# Patient Record
Sex: Female | Born: 1935 | Race: White | Hispanic: No | Smoking: Never smoker
Health system: Southern US, Community
[De-identification: ages and names within clinical notes are randomized; demographics above are authoritative.]

## PROBLEM LIST (undated history)

## (undated) DIAGNOSIS — Z90710 Acquired absence of both cervix and uterus: Secondary | ICD-10-CM

## (undated) DIAGNOSIS — Z9889 Other specified postprocedural states: Secondary | ICD-10-CM

## (undated) DIAGNOSIS — Z9049 Acquired absence of other specified parts of digestive tract: Secondary | ICD-10-CM

## (undated) DIAGNOSIS — S335XXA Sprain of ligaments of lumbar spine, initial encounter: Secondary | ICD-10-CM

## (undated) DIAGNOSIS — C4359 Malignant melanoma of other part of trunk: Secondary | ICD-10-CM

## (undated) DIAGNOSIS — I4891 Unspecified atrial fibrillation: Secondary | ICD-10-CM

## (undated) DIAGNOSIS — S42309D Unspecified fracture of shaft of humerus, unspecified arm, subsequent encounter for fracture with routine healing: Secondary | ICD-10-CM

## (undated) DIAGNOSIS — L719 Rosacea, unspecified: Secondary | ICD-10-CM

## (undated) DIAGNOSIS — E785 Hyperlipidemia, unspecified: Secondary | ICD-10-CM

## (undated) DIAGNOSIS — I10 Essential (primary) hypertension: Secondary | ICD-10-CM

## (undated) DIAGNOSIS — M5430 Sciatica, unspecified side: Secondary | ICD-10-CM

## (undated) HISTORY — PX: HEMORRHOID SURGERY: SHX153

## (undated) HISTORY — DX: Sciatica, unspecified side: M54.30

## (undated) HISTORY — DX: Sprain of ligaments of lumbar spine, initial encounter: S33.5XXA

## (undated) HISTORY — DX: Malignant melanoma of other part of trunk: C43.59

## (undated) HISTORY — PX: ABDOMINAL HYSTERECTOMY: SHX81

## (undated) HISTORY — DX: Other specified postprocedural states: Z98.89

## (undated) HISTORY — PX: CHOLECYSTECTOMY: SHX55

## (undated) HISTORY — DX: Acquired absence of other specified parts of digestive tract: Z90.49

## (undated) HISTORY — DX: Acquired absence of both cervix and uterus: Z90.710

## (undated) HISTORY — DX: Unspecified fracture of shaft of humerus, unspecified arm, subsequent encounter for fracture with routine healing: S42.309D

## (undated) HISTORY — PX: HERNIA REPAIR: SHX51

## (undated) HISTORY — DX: Unspecified atrial fibrillation: I48.91

## (undated) HISTORY — DX: Hyperlipidemia, unspecified: E78.5

## (undated) HISTORY — DX: Rosacea, unspecified: L71.9

## (undated) HISTORY — PX: OTHER SURGICAL HISTORY: SHX169

## (undated) HISTORY — PX: BLADDER SUSPENSION: SHX72

## (undated) HISTORY — DX: Other specified postprocedural states: Z98.890

## (undated) HISTORY — DX: Essential (primary) hypertension: I10

## (undated) NOTE — *Deleted (*Deleted)
PMR Admission Coordinator Pre-Admission Assessment  Patient: [name] [name] is an 70 y.o., female MRN: [id number] DOB: 01-17-1931 Height: 5\' 4"  (162.6 cm) Weight: 79.6 kg  Insurance Information HMO:     PPO:      PCP:      IPA:      80/20:      OTHER:  PRIMARY: Medicare Part A and B       Policy#: [id number] Subscriber: Patient  Benefits:  Phone #: passport one source online 9/4 Eff. Date: 09/27/1998  Deduct: $1484      Out of Pocket Max: none      Life Max: none CIR: 100%      SNF: 20 full days Outpatient: 80%     Co-Pay: 20% Home Health: 100%      Co-Pay: none DME: 80%     Co-Pay: 20% Providers: pt choice  SECONDARY: [employer]     Policy#: [id number]      The "Data Collection Information Summary" for patients in Inpatient Rehabilitation Facilities with attached "Privacy Act Statement-Health Care Records" was provided and verbally reviewed with: Patient and Family  Emergency Contact Information Contact Information    Name Relation Home Work Mobile   [facility] Son [phone]     Louise, Rawson [phone]     Eliane, [place] [phone]     Ysidro Evert Daughter [phone]  [phone]      Current Medical History  Patient Admitting Diagnosis:LLE Cellulitis History of Present Illness: 37 year old female with a past medical history of atrial fibrillation on warfarin, chronic diastolic CHF, essential hypertension, hyperlipidemia and anxiety who comes in with left leg pain nausea vomiting and diarrhea.  She was found to be febrile.  Found to be tachycardic.  Noted to have erythema and swelling of the left flank.  Admitted for management of sepsis secondary to left lower extremity cellulitis.   CBC did not show leukocytosis but had significant left WBC shift.  Lactate initially 6.7 that has downward trended to 4.4 following fluids. Had AKI on admission,  with creatinine mildly elevated to 1.10. CTA chest negative for PE but does have an incidental finding of a subsolid  groundglass nodule opacity in the right upper lobe measuring 12 mm with follow-up recommended. CT of the abdomen and pelvis show fat stranding involving the left anterior aspect of the upper thigh and tracking along the iliac vessel but no abscess.  There is also prominent left inguinal and external iliac nodes likely reactive from her left lower extremity cellulitis.  No lower extremity DVT or acute vascular findings.    Patient's medical record from [facility] has been reviewed by the rehabilitation admission coordinator and physician.  Past Medical History  Past Medical History:  Diagnosis Date  . A-fib (HCC)    paroxysmal  . Aftercare for healing traumatic fracture of arm, unspecified   . H/O: hemorrhoidectomy   . H/O: hysterectomy   . Hyperlipidemia   . Hypertension   . Malignant melanoma of skin of trunk, except scrotum (HCC)   . Other postprocedural status(V45.89)   . Rosacea   . S/P cholecystectomy   . Sciatica    right  . Sprain of lumbar region     Family History   family history includes Heart failure in her father and mother; Liver cancer in her daughter.  Prior Rehab/Hospitalizations Has the patient had prior rehab or hospitalizations prior to admission? Yes  Has the patient had major surgery during 100 days prior to admission? No  Current Medications  Current Facility-Administered Medications:  .  ALPRAZolam Prudy Feeler) tablet 0.25 mg, 0.25 mg, Oral, Daily PRN, Tu, Ching T, DO, 0.25 mg at 07/30/20 1855 .  amoxicillin (AMOXIL) capsule 500 mg, 500 mg, Oral, Q8H, Comer, Belia Heman, MD, 500 mg at 07/31/20 1354 .  bisacodyl (DULCOLAX) suppository 10 mg, 10 mg, Rectal, Daily PRN, Dow Adolph N, DO, 10 mg at 07/31/20 0002 .  calcium-vitamin D (OSCAL WITH D) 500-200 MG-UNIT per tablet 1 tablet, 1 tablet, Oral, Q breakfast, Hall, Carole N, DO .  dextromethorphan-guaiFENesin (MUCINEX DM) 30-600 MG per 12 hr tablet 2 tablet, 2 tablet, Oral, BID, Hall, Carole N,  DO, 2 tablet at 07/31/20 1145 .  diltiazem (CARDIZEM) tablet 30 mg, 30 mg, Oral, Q6H, Graciella Freer, PA-C, 30 mg at 07/31/20 1145 .  feeding supplement (ENSURE ENLIVE) (ENSURE ENLIVE) liquid 237 mL, 237 mL, Oral, BID BM, Hall, Carole N, DO .  furosemide (LASIX) injection 20 mg, 20 mg, Intravenous, BID, Hall, Carole N, DO, 20 mg at 07/31/20 1146 .  iohexol (OMNIPAQUE) 350 MG/ML injection 80 mL, 80 mL, Intravenous, Once PRN, Terrilee Files, MD .  ipratropium (ATROVENT) nebulizer solution 0.5 mg, 0.5 mg, Nebulization, BID, Hall, Carole N, DO .  levalbuterol [facility]) nebulizer solution 0.63 mg, 0.63 mg, Nebulization, BID, Hall, Carole N, DO .  magic mouthwash w/lidocaine, 2 mL, Oral, TID PRN, Dow Adolph N, DO, 2 mL at 07/30/20 1017 .  metoprolol tartrate (LOPRESSOR) injection 2.5 mg, 2.5 mg, Intravenous, Q3H PRN, Dow Adolph N, DO, 2.5 mg at 07/29/20 0801 .  metoprolol tartrate (LOPRESSOR) tablet 50 mg, 50 mg, Oral, BID, Hall, Carole N, DO, 50 mg at 07/31/20 1145 .  mineral oil enema 1 enema, 1 enema, Rectal, Once, Hall, Carole N, DO .  ondansetron [facility]) injection 4 mg, 4 mg, Intravenous, Q6H PRN, Osvaldo Shipper, MD, 4 mg at 07/26/20 0814 .  polyethylene glycol (MIRALAX / GLYCOLAX) packet 17 g, 17 g, Oral, Daily, Hall, Carole N, DO, 17 g at 07/31/20 1145 .  polyvinyl alcohol (LIQUIFILM TEARS) 1.4 % ophthalmic solution 1 drop, 1 drop, Both Eyes, BID PRN, Leander Rams, [facility] .  senna-docusate (Senokot-S) tablet 2 tablet, 2 tablet, Oral, BID, Dow Adolph N, DO, 2 tablet at 07/31/20 1145 .  sodium chloride HYPERTONIC 3 % nebulizer solution 4 mL, 4 mL, Nebulization, BID, Hall, Carole N, DO, 4 mL at 07/31/20 0855 .  warfarin (COUMADIN) tablet 1 mg, 1 mg, Oral, ONCE-1600, Hall, Carole N, DO .  Warfarin - Pharmacist Dosing Inpatient, , Does not apply, q1600, Joaquim Lai, [facility], Given at 07/29/20 1716  Patients Current Diet:  Diet Order            Diet regular Room service appropriate?  Yes; Fluid consistency: Thin  Diet effective now                 Precautions / Restrictions Precautions Precautions: Fall Precaution Comments: L LE pain Restrictions Weight Bearing Restrictions: No   Has the patient had 2 or more falls or a fall with injury in the past year? No  Prior Activity Level  Pt. Was active in the community, going out 1-2x a week.  Prior Functional Level Self Care: Did the patient need help bathing, dressing, using the toilet or eating? Independent  Indoor Mobility: Did the patient need assistance with walking from room to room (with or without device)? Independent  Stairs: Did the patient need assistance with internal or external stairs (with or  without device)? Independent  Functional Cognition: Did the patient need help planning regular tasks such as shopping or remembering to take medications? Independent  Home Assistive Devices / Equipment Home Assistive Devices/Equipment: Cane (specify quad or straight) Home Equipment: Tub bench, Cane - single point, Walker - 2 wheels  Prior Device Use: Indicate devices/aids used by the patient prior to current illness, exacerbation or injury? None of the above  Current Functional Level Cognition  Overall Cognitive Status: Within Functional Limits for tasks assessed Orientation Level: Oriented X4    Extremity Assessment (includes Sensation/Coordination)  Upper Extremity Assessment: Generalized weakness  Lower Extremity Assessment: Generalized weakness    ADLs  Overall ADL's : Needs assistance/impaired Eating/Feeding: Set up, Sitting Grooming: Set up, Sitting Upper Body Bathing: Sitting, Minimal assistance Lower Body Bathing: Total assistance, Bed level Upper Body Dressing : Minimal assistance, Sitting Lower Body Dressing: Total assistance, Bed level Lower Body Dressing Details (indicate cue type and reason): Total A to don socks, difficulty donning L sock due to pain Toileting- Clothing Manipulation  and Hygiene: Total assistance, Bed level General ADL Comments: Pt with deficits in strength and endurance though greatly limited by pain in L LE (with touch and movement) requiring extensive assist for ADLs/transfers    Mobility  Overal bed mobility: Needs Assistance Bed Mobility: Supine to Sit Rolling: Mod assist, +2 for physical assistance, +2 for safety/equipment Supine to sit: Max assist, +2 for physical assistance, +2 for safety/equipment Sit to supine: Max assist, +2 for physical assistance, +2 for safety/equipment General bed mobility comments: maxAx2 for coming to EoB, cued for scooting to EOB however needs pad scoot to get L foot on floor    Transfers  Overall transfer level: Needs assistance Transfers: Squat Pivot Transfers Squat pivot transfers: +2 physical assistance, Total assist General transfer comment: total Ax2 for lateral scooting and squat pivot to recliner,     Ambulation / Gait / Stairs / Wheelchair Mobility  Ambulation/Gait General Gait Details: unable to take a step    Posture / Balance Dynamic Sitting Balance Sitting balance - Comments: initially max A able to progress to min guard  Balance Overall balance assessment: Needs assistance Sitting-balance support: Feet supported, Bilateral upper extremity supported Sitting balance-Leahy Scale: Poor Sitting balance - Comments: initially max A able to progress to min guard  Standing balance comment: unable    Special needs/care consideration Skin LLE cellulitis, red, swollen with dressing in place and Designated visitor Daughter   Previous Home Environment (from acute therapy documentation) Living Arrangements: Alone Available Help at Discharge: Family, Available PRN/intermittently Type of Home: House Home Layout: One level Home Access: Stairs to enter, Ramped entrance Entrance Stairs-Rails: Right, Left Entrance Stairs-Number of Steps: 6 Bathroom Shower/Tub: Engineer, manufacturing systems: Standard Home  Care Services: No Additional Comments: Reports tub bench was her husbands and has difficulty using - would like regular shower chair  Discharge Living Setting Plans for Discharge Living Setting: House Type of Home at Discharge: House Discharge Home Layout: Two level Alternate Level Stairs-Rails: None Alternate Level Stairs-Number of Steps: 4 (4) Discharge Bathroom Shower/Tub: Tub/shower unit Discharge Bathroom Toilet: Standard Discharge Bathroom Accessibility: Yes How Accessible: Accessible via wheelchair, Accessible via walker Does the patient have any problems obtaining your medications?: No  Social/Family/Support Systems Patient Roles: Other (Comment) Contact Information: [phone] Anticipated Caregiver: Georgiann Cocker Anticipated Caregiver's Contact Information: [phone] Ability/Limitations of Caregiver:  (None) Caregiver Availability: 24/7 Discharge Plan Discussed with Primary Caregiver: Yes Is Caregiver In Agreement with Plan?: Yes  Goals Patient/Family Goal  for Rehab: PT/OT Mod A Expected length of stay: 14-18 days Pt/Family Agrees to Admission and willing to participate: Yes Program Orientation Provided & Reviewed with Pt/Caregiver Including Roles  & Responsibilities: Yes  Decrease burden of Care through IP rehab admission: Specialzed equipment needs, Decrease number of caregivers and Patient/family education  Possible need for SNF placement upon discharge: Not anticipated  Patient Condition: I have reviewed medical records from [facility], spoken with CM, and patient and family member. I met with patient at the bedside for inpatient rehabilitation assessment.  Patient will benefit from ongoing PT and OT, can actively participate in 3 hours of therapy a day 5 days of the week, and can make measurable gains during the admission.  Patient will also benefit from the coordinated team approach during an Inpatient Acute Rehabilitation admission.  The  patient will receive intensive therapy as well as Rehabilitation physician, nursing, social worker, and care management interventions.  Due to safety, skin/wound care, disease management, medication administration, pain management and patient education the patient requires 24 hour a day rehabilitation nursing.  The patient is currently max A-Max +2  with mobility and basic ADLs.  Discharge setting and therapy post discharge at home with home health is anticipated.  Patient has agreed to participate in the Acute Inpatient Rehabilitation Program and will admit {Time; today/tomorrow:10263}.  Preadmission Screen Completed By:  Jeronimo Greaves, 07/31/2020 3:05 PM ______________________________________________________________________   Discussed status with Dr. Marland Kitchen on *** at *** and received approval for admission today.  Admission Coordinator:  Jeronimo Greaves, CCC-SLP, time ***Dorna Bloom ***   Assessment/Plan: Diagnosis: 1. Does the need for close, 24 hr/day Medical supervision in concert with the patient's rehab needs make it unreasonable for this patient to be served in a less intensive setting? {yes_no_potentially:3041433} 2. Co-Morbidities requiring supervision/potential complications: *** 3. Due to {due ZO:1096045}, does the patient require 24 hr/day rehab nursing? {yes_no_potentially:3041433} 4. Does the patient require coordinated care of a physician, rehab nurse, PT, OT, and SLP to address physical and functional deficits in the context of the above medical diagnosis(es)? {yes_no_potentially:3041433} Addressing deficits in the following areas: {deficits:3041436} 5. Can the patient actively participate in an intensive therapy program of at least 3 hrs of therapy 5 days a week? {yes_no_potentially:3041433} 6. The potential for patient to make measurable gains while on inpatient rehab is {potential:3041437} 7. Anticipated functional outcomes upon discharge from inpatient rehab: {functional  outcomes:304600100} PT, {functional outcomes:304600100} OT, {functional outcomes:304600100} SLP 8. Estimated rehab length of stay to reach the above functional goals is: *** 9. Anticipated discharge destination: {anticipated dc setting:21604} 10. Overall Rehab/Functional Prognosis: {potential:3041437}   MD Signature: ***

---

## 2001-04-23 ENCOUNTER — Encounter (INDEPENDENT_AMBULATORY_CARE_PROVIDER_SITE_OTHER): Payer: Self-pay

## 2001-04-23 ENCOUNTER — Other Ambulatory Visit: Admission: RE | Admit: 2001-04-23 | Discharge: 2001-04-23 | Payer: Self-pay | Admitting: Gastroenterology

## 2003-05-12 ENCOUNTER — Encounter: Payer: Self-pay | Admitting: Internal Medicine

## 2003-05-12 ENCOUNTER — Encounter: Admission: RE | Admit: 2003-05-12 | Discharge: 2003-05-12 | Payer: Self-pay | Admitting: Internal Medicine

## 2004-05-19 ENCOUNTER — Ambulatory Visit (HOSPITAL_COMMUNITY): Admission: RE | Admit: 2004-05-19 | Discharge: 2004-05-19 | Payer: Self-pay | Admitting: Internal Medicine

## 2004-09-01 LAB — HM COLONOSCOPY
HM Colonoscopy: ABNORMAL
HM Colonoscopy: ABNORMAL

## 2004-10-31 ENCOUNTER — Ambulatory Visit: Payer: Self-pay | Admitting: Internal Medicine

## 2004-11-07 ENCOUNTER — Ambulatory Visit: Payer: Self-pay | Admitting: Internal Medicine

## 2004-11-08 ENCOUNTER — Ambulatory Visit (HOSPITAL_COMMUNITY): Admission: RE | Admit: 2004-11-08 | Discharge: 2004-11-08 | Payer: Self-pay | Admitting: *Deleted

## 2004-11-08 ENCOUNTER — Ambulatory Visit: Payer: Self-pay | Admitting: Internal Medicine

## 2004-11-11 ENCOUNTER — Ambulatory Visit: Payer: Self-pay | Admitting: Cardiology

## 2004-11-15 ENCOUNTER — Ambulatory Visit: Payer: Self-pay | Admitting: Cardiology

## 2004-11-15 ENCOUNTER — Ambulatory Visit: Payer: Self-pay

## 2004-11-22 ENCOUNTER — Ambulatory Visit: Payer: Self-pay | Admitting: Cardiology

## 2004-12-01 ENCOUNTER — Ambulatory Visit: Payer: Self-pay | Admitting: Internal Medicine

## 2004-12-02 ENCOUNTER — Ambulatory Visit: Payer: Self-pay | Admitting: Internal Medicine

## 2004-12-29 ENCOUNTER — Ambulatory Visit: Payer: Self-pay | Admitting: Cardiology

## 2005-01-25 ENCOUNTER — Ambulatory Visit: Payer: Self-pay | Admitting: Cardiology

## 2005-01-27 ENCOUNTER — Ambulatory Visit: Payer: Self-pay | Admitting: Internal Medicine

## 2005-01-31 ENCOUNTER — Ambulatory Visit: Payer: Self-pay | Admitting: Internal Medicine

## 2005-02-21 ENCOUNTER — Ambulatory Visit: Payer: Self-pay

## 2005-02-21 ENCOUNTER — Ambulatory Visit: Payer: Self-pay | Admitting: Internal Medicine

## 2005-03-14 ENCOUNTER — Ambulatory Visit: Payer: Self-pay | Admitting: Cardiology

## 2005-03-24 ENCOUNTER — Ambulatory Visit: Payer: Self-pay | Admitting: Internal Medicine

## 2005-04-11 ENCOUNTER — Ambulatory Visit: Payer: Self-pay | Admitting: Cardiology

## 2005-05-09 ENCOUNTER — Ambulatory Visit: Payer: Self-pay | Admitting: Internal Medicine

## 2005-05-11 ENCOUNTER — Ambulatory Visit: Payer: Self-pay | Admitting: Internal Medicine

## 2005-05-17 ENCOUNTER — Ambulatory Visit: Payer: Self-pay | Admitting: Cardiology

## 2005-05-22 ENCOUNTER — Ambulatory Visit: Payer: Self-pay | Admitting: Internal Medicine

## 2005-05-25 ENCOUNTER — Ambulatory Visit: Payer: Self-pay | Admitting: Cardiology

## 2005-05-29 ENCOUNTER — Ambulatory Visit: Payer: Self-pay | Admitting: Internal Medicine

## 2005-06-08 ENCOUNTER — Ambulatory Visit: Payer: Self-pay | Admitting: Cardiology

## 2005-06-08 ENCOUNTER — Ambulatory Visit: Payer: Self-pay | Admitting: Internal Medicine

## 2005-06-30 ENCOUNTER — Ambulatory Visit: Payer: Self-pay | Admitting: Cardiology

## 2005-06-30 ENCOUNTER — Ambulatory Visit: Payer: Self-pay | Admitting: Internal Medicine

## 2005-07-12 ENCOUNTER — Ambulatory Visit: Payer: Self-pay | Admitting: Internal Medicine

## 2005-07-17 ENCOUNTER — Ambulatory Visit: Payer: Self-pay | Admitting: Internal Medicine

## 2005-07-21 ENCOUNTER — Inpatient Hospital Stay (HOSPITAL_COMMUNITY): Admission: RE | Admit: 2005-07-21 | Discharge: 2005-07-22 | Payer: Self-pay | Admitting: Internal Medicine

## 2005-07-23 ENCOUNTER — Ambulatory Visit: Payer: Self-pay | Admitting: Cardiology

## 2005-07-28 ENCOUNTER — Ambulatory Visit: Payer: Self-pay | Admitting: Cardiology

## 2005-08-11 ENCOUNTER — Ambulatory Visit: Payer: Self-pay | Admitting: Cardiology

## 2005-08-17 ENCOUNTER — Ambulatory Visit: Payer: Self-pay | Admitting: Internal Medicine

## 2005-08-25 ENCOUNTER — Ambulatory Visit: Payer: Self-pay | Admitting: Cardiology

## 2005-09-19 ENCOUNTER — Ambulatory Visit: Payer: Self-pay | Admitting: Internal Medicine

## 2005-10-03 ENCOUNTER — Ambulatory Visit: Payer: Self-pay | Admitting: Cardiology

## 2005-10-16 ENCOUNTER — Ambulatory Visit: Payer: Self-pay | Admitting: Cardiology

## 2005-12-11 ENCOUNTER — Ambulatory Visit: Payer: Self-pay | Admitting: Cardiology

## 2005-12-11 ENCOUNTER — Ambulatory Visit: Payer: Self-pay | Admitting: Internal Medicine

## 2005-12-25 ENCOUNTER — Ambulatory Visit: Payer: Self-pay | Admitting: Cardiology

## 2006-01-08 ENCOUNTER — Ambulatory Visit: Payer: Self-pay | Admitting: Internal Medicine

## 2006-02-05 ENCOUNTER — Ambulatory Visit: Payer: Self-pay | Admitting: Cardiology

## 2006-02-12 ENCOUNTER — Ambulatory Visit: Payer: Self-pay | Admitting: Internal Medicine

## 2006-02-26 ENCOUNTER — Ambulatory Visit: Payer: Self-pay | Admitting: Cardiology

## 2006-03-09 ENCOUNTER — Ambulatory Visit: Payer: Self-pay | Admitting: Cardiology

## 2006-03-23 ENCOUNTER — Ambulatory Visit: Payer: Self-pay | Admitting: Internal Medicine

## 2006-03-27 ENCOUNTER — Ambulatory Visit: Payer: Self-pay

## 2006-04-06 ENCOUNTER — Ambulatory Visit: Payer: Self-pay

## 2006-04-06 ENCOUNTER — Ambulatory Visit: Payer: Self-pay | Admitting: Internal Medicine

## 2006-04-18 ENCOUNTER — Ambulatory Visit: Payer: Self-pay | Admitting: Internal Medicine

## 2006-04-24 ENCOUNTER — Ambulatory Visit: Payer: Self-pay | Admitting: Internal Medicine

## 2006-05-10 ENCOUNTER — Ambulatory Visit: Payer: Self-pay | Admitting: Internal Medicine

## 2006-05-22 ENCOUNTER — Ambulatory Visit: Payer: Self-pay | Admitting: Cardiovascular Disease

## 2006-06-06 ENCOUNTER — Ambulatory Visit: Payer: Self-pay | Admitting: Internal Medicine

## 2006-06-20 ENCOUNTER — Ambulatory Visit: Payer: Self-pay | Admitting: Cardiology

## 2006-07-11 ENCOUNTER — Ambulatory Visit: Payer: Self-pay | Admitting: *Deleted

## 2006-07-20 ENCOUNTER — Ambulatory Visit: Payer: Self-pay | Admitting: Cardiovascular Disease

## 2006-07-24 ENCOUNTER — Ambulatory Visit: Payer: Self-pay | Admitting: Internal Medicine

## 2006-08-03 ENCOUNTER — Ambulatory Visit: Payer: Self-pay | Admitting: Cardiology

## 2006-08-15 ENCOUNTER — Ambulatory Visit: Payer: Self-pay | Admitting: Internal Medicine

## 2006-08-31 ENCOUNTER — Ambulatory Visit: Payer: Self-pay | Admitting: Cardiology

## 2006-08-31 ENCOUNTER — Ambulatory Visit: Payer: Self-pay | Admitting: Internal Medicine

## 2006-09-28 ENCOUNTER — Ambulatory Visit: Payer: Self-pay | Admitting: Cardiology

## 2006-10-26 ENCOUNTER — Ambulatory Visit: Payer: Self-pay | Admitting: Cardiology

## 2006-11-23 ENCOUNTER — Ambulatory Visit: Payer: Self-pay | Admitting: Cardiology

## 2006-12-17 ENCOUNTER — Ambulatory Visit: Payer: Self-pay | Admitting: Internal Medicine

## 2006-12-17 LAB — CONVERTED CEMR LAB
HCT: 41.9 % (ref 36.0–46.0)
Hemoglobin: 14.9 g/dL (ref 12.0–15.0)
MCHC: 35.4 g/dL (ref 30.0–36.0)
MCV: 91.1 fL (ref 78.0–100.0)
Platelets: 262 10*3/uL (ref 150–400)
RBC: 4.6 M/uL (ref 3.87–5.11)
RDW: 12.5 % (ref 11.5–14.6)
Rhuematoid fact SerPl-aCnc: <20 intl units/mL — ABNORMAL LOW (ref 0.0–20.0)
Sed Rate: 17 mm/hr (ref 0–25)
Total CK: 98 units/L (ref 7–177)
WBC: 6.9 10*3/uL (ref 4.5–10.5)

## 2006-12-21 ENCOUNTER — Ambulatory Visit: Payer: Self-pay | Admitting: Internal Medicine

## 2007-01-18 ENCOUNTER — Ambulatory Visit: Payer: Self-pay | Admitting: Internal Medicine

## 2007-01-30 ENCOUNTER — Ambulatory Visit: Payer: Self-pay | Admitting: *Deleted

## 2007-02-11 ENCOUNTER — Ambulatory Visit: Payer: Self-pay | Admitting: Internal Medicine

## 2007-02-13 ENCOUNTER — Ambulatory Visit: Payer: Self-pay | Admitting: Cardiovascular Disease

## 2007-02-13 ENCOUNTER — Ambulatory Visit: Payer: Self-pay | Admitting: Internal Medicine

## 2007-02-13 LAB — CONVERTED CEMR LAB
ALT: 31 units/L (ref 0–40)
AST: 31 units/L (ref 0–37)
Albumin: 3.8 g/dL (ref 3.5–5.2)
Alkaline Phosphatase: 87 units/L (ref 39–117)
Bilirubin, Direct: 0.3 mg/dL (ref 0.0–0.3)
Cholesterol: 170 mg/dL (ref 0–200)
HDL: 73.6 mg/dL (ref >39.0)
LDL Cholesterol: 85 mg/dL (ref 0–99)
Total Bilirubin: 1.8 mg/dL — ABNORMAL HIGH (ref 0.3–1.2)
Total CHOL/HDL Ratio: 2.3
Total Protein: 7.4 g/dL (ref 6.0–8.3)
Triglycerides: 59 mg/dL (ref 0–149)
VLDL: 12 mg/dL (ref 0–40)

## 2007-03-13 ENCOUNTER — Ambulatory Visit: Payer: Self-pay | Admitting: Cardiology

## 2007-04-10 ENCOUNTER — Ambulatory Visit: Payer: Self-pay | Admitting: *Deleted

## 2007-05-08 ENCOUNTER — Ambulatory Visit: Payer: Self-pay | Admitting: Cardiology

## 2007-06-05 ENCOUNTER — Ambulatory Visit: Payer: Self-pay

## 2007-06-19 ENCOUNTER — Ambulatory Visit: Payer: Self-pay | Admitting: Cardiology

## 2007-07-17 ENCOUNTER — Ambulatory Visit: Payer: Self-pay | Admitting: Cardiology

## 2007-07-30 ENCOUNTER — Ambulatory Visit: Payer: Self-pay | Admitting: Internal Medicine

## 2007-08-14 ENCOUNTER — Ambulatory Visit: Payer: Self-pay | Admitting: Internal Medicine

## 2007-09-06 ENCOUNTER — Encounter: Payer: Self-pay | Admitting: *Deleted

## 2007-09-06 DIAGNOSIS — Z9889 Other specified postprocedural states: Secondary | ICD-10-CM | POA: Insufficient documentation

## 2007-09-06 DIAGNOSIS — I4891 Unspecified atrial fibrillation: Secondary | ICD-10-CM | POA: Insufficient documentation

## 2007-09-06 DIAGNOSIS — Z9079 Acquired absence of other genital organ(s): Secondary | ICD-10-CM | POA: Insufficient documentation

## 2007-09-06 DIAGNOSIS — I10 Essential (primary) hypertension: Secondary | ICD-10-CM | POA: Insufficient documentation

## 2007-09-06 DIAGNOSIS — E785 Hyperlipidemia, unspecified: Secondary | ICD-10-CM | POA: Insufficient documentation

## 2007-09-06 DIAGNOSIS — Z9089 Acquired absence of other organs: Secondary | ICD-10-CM | POA: Insufficient documentation

## 2007-09-11 ENCOUNTER — Ambulatory Visit: Payer: Self-pay | Admitting: Cardiovascular Disease

## 2007-09-23 ENCOUNTER — Telehealth (INDEPENDENT_AMBULATORY_CARE_PROVIDER_SITE_OTHER): Payer: Self-pay | Admitting: *Deleted

## 2007-10-04 ENCOUNTER — Telehealth: Payer: Self-pay | Admitting: Internal Medicine

## 2007-10-04 ENCOUNTER — Ambulatory Visit: Payer: Self-pay | Admitting: Cardiology

## 2007-10-30 ENCOUNTER — Ambulatory Visit: Payer: Self-pay | Admitting: Internal Medicine

## 2007-10-31 ENCOUNTER — Telehealth: Payer: Self-pay | Admitting: Internal Medicine

## 2007-11-13 ENCOUNTER — Encounter: Payer: Self-pay | Admitting: Internal Medicine

## 2007-11-15 ENCOUNTER — Encounter: Payer: Self-pay | Admitting: Internal Medicine

## 2007-11-26 ENCOUNTER — Ambulatory Visit: Payer: Self-pay | Admitting: Internal Medicine

## 2007-12-10 ENCOUNTER — Ambulatory Visit: Payer: Self-pay | Admitting: Cardiovascular Disease

## 2008-01-07 ENCOUNTER — Ambulatory Visit: Payer: Self-pay | Admitting: Cardiovascular Disease

## 2008-01-14 ENCOUNTER — Encounter: Payer: Self-pay | Admitting: Internal Medicine

## 2008-01-21 ENCOUNTER — Encounter: Payer: Self-pay | Admitting: Internal Medicine

## 2008-02-04 ENCOUNTER — Ambulatory Visit: Payer: Self-pay | Admitting: Cardiology

## 2008-02-05 ENCOUNTER — Ambulatory Visit: Payer: Self-pay | Admitting: Internal Medicine

## 2008-02-21 ENCOUNTER — Ambulatory Visit: Payer: Self-pay | Admitting: Cardiology

## 2008-03-17 ENCOUNTER — Ambulatory Visit: Payer: Self-pay | Admitting: Internal Medicine

## 2008-03-17 LAB — CONVERTED CEMR LAB
ALT: 33 units/L (ref 0–35)
AST: 30 units/L (ref 0–37)
Albumin: 3.7 g/dL (ref 3.5–5.2)
Alkaline Phosphatase: 81 units/L (ref 39–117)
BUN: 16 mg/dL (ref 6–23)
Bilirubin, Direct: 0.2 mg/dL (ref 0.0–0.3)
CO2: 29 meq/L (ref 19–32)
Calcium: 9.6 mg/dL (ref 8.4–10.5)
Chloride: 108 meq/L (ref 96–112)
Cholesterol: 198 mg/dL (ref 0–200)
Creatinine, Ser: 0.9 mg/dL (ref 0.4–1.2)
GFR calc Af Amer: 79 mL/min
GFR calc non Af Amer: 65 mL/min
Glucose, Bld: 112 mg/dL — ABNORMAL HIGH (ref 70–99)
HDL: 87.2 mg/dL (ref >39.0)
LDL Cholesterol: 93 mg/dL (ref 0–99)
Potassium: 4.2 meq/L (ref 3.5–5.1)
Sodium: 145 meq/L (ref 135–145)
TSH: 3.72 microintl units/mL (ref 0.35–5.50)
Total Bilirubin: 1.6 mg/dL — ABNORMAL HIGH (ref 0.3–1.2)
Total CHOL/HDL Ratio: 2.3
Total Protein: 7.2 g/dL (ref 6.0–8.3)
Triglycerides: 89 mg/dL (ref 0–149)
VLDL: 18 mg/dL (ref 0–40)

## 2008-03-18 ENCOUNTER — Ambulatory Visit: Payer: Self-pay | Admitting: Cardiology

## 2008-03-19 ENCOUNTER — Encounter: Payer: Self-pay | Admitting: Internal Medicine

## 2008-04-08 ENCOUNTER — Ambulatory Visit: Payer: Self-pay | Admitting: Cardiology

## 2008-04-28 ENCOUNTER — Ambulatory Visit: Payer: Self-pay | Admitting: Cardiology

## 2008-05-26 ENCOUNTER — Ambulatory Visit: Payer: Self-pay | Admitting: Cardiology

## 2008-06-03 ENCOUNTER — Ambulatory Visit: Payer: Self-pay | Admitting: Internal Medicine

## 2008-06-03 DIAGNOSIS — M5430 Sciatica, unspecified side: Secondary | ICD-10-CM | POA: Insufficient documentation

## 2008-06-06 ENCOUNTER — Encounter: Payer: Self-pay | Admitting: Internal Medicine

## 2008-06-23 ENCOUNTER — Ambulatory Visit: Payer: Self-pay | Admitting: Internal Medicine

## 2008-07-15 ENCOUNTER — Encounter: Payer: Self-pay | Admitting: Internal Medicine

## 2008-07-21 ENCOUNTER — Ambulatory Visit: Payer: Self-pay | Admitting: Cardiology

## 2008-08-20 ENCOUNTER — Ambulatory Visit: Payer: Self-pay | Admitting: Cardiology

## 2008-08-25 ENCOUNTER — Ambulatory Visit: Payer: Self-pay | Admitting: Internal Medicine

## 2008-08-26 ENCOUNTER — Ambulatory Visit: Payer: Self-pay | Admitting: Internal Medicine

## 2008-08-27 ENCOUNTER — Ambulatory Visit: Payer: Self-pay | Admitting: Internal Medicine

## 2008-08-27 LAB — CONVERTED CEMR LAB
ALT: 21 units/L (ref 0–35)
AST: 25 units/L (ref 0–37)
Albumin: 3.6 g/dL (ref 3.5–5.2)
Alkaline Phosphatase: 91 units/L (ref 39–117)
BUN: 15 mg/dL (ref 6–23)
Basophils Absolute: 0 10*3/uL (ref 0.0–0.1)
Basophils Relative: 0.7 % (ref 0.0–3.0)
Bilirubin, Direct: 0.4 mg/dL — ABNORMAL HIGH (ref 0.0–0.3)
CO2: 30 meq/L (ref 19–32)
Calcium: 9.1 mg/dL (ref 8.4–10.5)
Chloride: 105 meq/L (ref 96–112)
Cholesterol: 170 mg/dL (ref 0–200)
Creatinine, Ser: 0.7 mg/dL (ref 0.4–1.2)
Eosinophils Absolute: 0.2 10*3/uL (ref 0.0–0.7)
Eosinophils Relative: 2.7 % (ref 0.0–5.0)
GFR calc Af Amer: 105 mL/min
GFR calc non Af Amer: 87 mL/min
Glucose, Bld: 129 mg/dL — ABNORMAL HIGH (ref 70–99)
HCT: 40.8 % (ref 36.0–46.0)
HDL: 68.9 mg/dL (ref >39.0)
Hemoglobin: 13.8 g/dL (ref 12.0–15.0)
Hgb A1c MFr Bld: 6.3 % — ABNORMAL HIGH (ref 4.6–6.0)
LDL Cholesterol: 88 mg/dL (ref 0–99)
Lymphocytes Relative: 34.7 % (ref 12.0–46.0)
MCHC: 33.7 g/dL (ref 30.0–36.0)
MCV: 92.1 fL (ref 78.0–100.0)
Monocytes Absolute: 0.4 10*3/uL (ref 0.1–1.0)
Monocytes Relative: 6.8 % (ref 3.0–12.0)
Neutro Abs: 3.1 10*3/uL (ref 1.4–7.7)
Neutrophils Relative %: 55.1 % (ref 43.0–77.0)
Platelets: 296 10*3/uL (ref 150–400)
Potassium: 3.7 meq/L (ref 3.5–5.1)
RBC: 4.43 M/uL (ref 3.87–5.11)
RDW: 12.9 % (ref 11.5–14.6)
Sodium: 145 meq/L (ref 135–145)
TSH: 3.61 microintl units/mL (ref 0.35–5.50)
Total Bilirubin: 1.7 mg/dL — ABNORMAL HIGH (ref 0.3–1.2)
Total CHOL/HDL Ratio: 2.5
Total Protein: 7 g/dL (ref 6.0–8.3)
Triglycerides: 64 mg/dL (ref 0–149)
VLDL: 13 mg/dL (ref 0–40)
WBC: 5.6 10*3/uL (ref 4.5–10.5)

## 2008-08-31 ENCOUNTER — Encounter: Payer: Self-pay | Admitting: Internal Medicine

## 2008-09-10 ENCOUNTER — Encounter: Payer: Self-pay | Admitting: Internal Medicine

## 2008-09-11 ENCOUNTER — Telehealth: Payer: Self-pay | Admitting: Internal Medicine

## 2008-09-15 ENCOUNTER — Telehealth: Payer: Self-pay | Admitting: Internal Medicine

## 2008-09-15 ENCOUNTER — Ambulatory Visit: Payer: Self-pay | Admitting: Internal Medicine

## 2008-09-15 DIAGNOSIS — S335XXA Sprain of ligaments of lumbar spine, initial encounter: Secondary | ICD-10-CM | POA: Insufficient documentation

## 2008-09-15 DIAGNOSIS — L719 Rosacea, unspecified: Secondary | ICD-10-CM | POA: Insufficient documentation

## 2008-09-17 ENCOUNTER — Ambulatory Visit: Payer: Self-pay | Admitting: Cardiology

## 2008-09-28 ENCOUNTER — Ambulatory Visit: Payer: Self-pay | Admitting: Cardiovascular Disease

## 2008-10-06 ENCOUNTER — Telehealth: Payer: Self-pay | Admitting: Internal Medicine

## 2008-10-07 ENCOUNTER — Encounter: Payer: Self-pay | Admitting: Internal Medicine

## 2008-10-07 DIAGNOSIS — L989 Disorder of the skin and subcutaneous tissue, unspecified: Secondary | ICD-10-CM | POA: Insufficient documentation

## 2008-10-12 ENCOUNTER — Encounter: Payer: Self-pay | Admitting: Internal Medicine

## 2008-10-12 ENCOUNTER — Ambulatory Visit: Payer: Self-pay | Admitting: Cardiology

## 2008-10-29 ENCOUNTER — Ambulatory Visit: Payer: Self-pay | Admitting: Cardiology

## 2008-11-23 ENCOUNTER — Encounter: Payer: Self-pay | Admitting: Internal Medicine

## 2008-11-25 ENCOUNTER — Ambulatory Visit: Payer: Self-pay | Admitting: Internal Medicine

## 2008-12-24 ENCOUNTER — Ambulatory Visit: Payer: Self-pay | Admitting: Internal Medicine

## 2009-01-21 ENCOUNTER — Ambulatory Visit: Payer: Self-pay | Admitting: Internal Medicine

## 2009-01-28 ENCOUNTER — Ambulatory Visit: Payer: Self-pay | Admitting: Cardiovascular Disease

## 2009-02-19 ENCOUNTER — Ambulatory Visit: Payer: Self-pay | Admitting: Cardiology

## 2009-03-05 ENCOUNTER — Ambulatory Visit: Payer: Self-pay | Admitting: Cardiology

## 2009-03-25 ENCOUNTER — Ambulatory Visit: Payer: Self-pay | Admitting: Internal Medicine

## 2009-03-30 ENCOUNTER — Telehealth: Payer: Self-pay | Admitting: Internal Medicine

## 2009-04-07 ENCOUNTER — Ambulatory Visit: Payer: Self-pay | Admitting: Cardiology

## 2009-04-16 ENCOUNTER — Ambulatory Visit: Payer: Self-pay | Admitting: Cardiology

## 2009-04-27 ENCOUNTER — Encounter: Payer: Self-pay | Admitting: *Deleted

## 2009-04-30 ENCOUNTER — Ambulatory Visit: Payer: Self-pay | Admitting: Cardiology

## 2009-04-30 LAB — CONVERTED CEMR LAB
POC INR: 1.8
Protime: 16.6

## 2009-05-05 ENCOUNTER — Telehealth: Payer: Self-pay | Admitting: Internal Medicine

## 2009-05-14 ENCOUNTER — Encounter (INDEPENDENT_AMBULATORY_CARE_PROVIDER_SITE_OTHER): Payer: Self-pay | Admitting: Pharmacist

## 2009-05-14 ENCOUNTER — Ambulatory Visit: Payer: Self-pay | Admitting: Internal Medicine

## 2009-05-14 LAB — CONVERTED CEMR LAB
POC INR: 2
Protime: 17.6

## 2009-05-18 ENCOUNTER — Telehealth: Payer: Self-pay | Admitting: Internal Medicine

## 2009-05-18 ENCOUNTER — Ambulatory Visit: Payer: Self-pay | Admitting: Internal Medicine

## 2009-05-19 DIAGNOSIS — I4892 Unspecified atrial flutter: Secondary | ICD-10-CM | POA: Insufficient documentation

## 2009-05-24 ENCOUNTER — Encounter: Payer: Self-pay | Admitting: Internal Medicine

## 2009-06-01 ENCOUNTER — Ambulatory Visit: Payer: Self-pay | Admitting: Cardiology

## 2009-06-01 ENCOUNTER — Encounter (INDEPENDENT_AMBULATORY_CARE_PROVIDER_SITE_OTHER): Payer: Self-pay | Admitting: Cardiology

## 2009-06-01 LAB — CONVERTED CEMR LAB
POC INR: 2.5
Prothrombin Time: 19.3 s

## 2009-06-02 ENCOUNTER — Encounter: Payer: Self-pay | Admitting: *Deleted

## 2009-06-08 ENCOUNTER — Encounter: Payer: Self-pay | Admitting: Internal Medicine

## 2009-06-11 ENCOUNTER — Encounter: Admission: RE | Admit: 2009-06-11 | Discharge: 2009-08-26 | Payer: Self-pay | Admitting: Internal Medicine

## 2009-06-11 ENCOUNTER — Encounter: Payer: Self-pay | Admitting: Internal Medicine

## 2009-06-29 ENCOUNTER — Ambulatory Visit: Payer: Self-pay | Admitting: Cardiology

## 2009-06-29 LAB — CONVERTED CEMR LAB
POC INR: 2.4
Prothrombin Time: 18.8 s

## 2009-07-28 ENCOUNTER — Ambulatory Visit: Payer: Self-pay | Admitting: Internal Medicine

## 2009-07-28 ENCOUNTER — Encounter (INDEPENDENT_AMBULATORY_CARE_PROVIDER_SITE_OTHER): Payer: Self-pay | Admitting: Cardiology

## 2009-07-28 LAB — CONVERTED CEMR LAB: POC INR: 2.2

## 2009-08-21 ENCOUNTER — Encounter: Payer: Self-pay | Admitting: Internal Medicine

## 2009-08-25 ENCOUNTER — Encounter: Payer: Self-pay | Admitting: Internal Medicine

## 2009-08-25 ENCOUNTER — Ambulatory Visit: Payer: Self-pay | Admitting: Cardiovascular Disease

## 2009-08-25 LAB — CONVERTED CEMR LAB: POC INR: 2.8

## 2009-09-22 ENCOUNTER — Ambulatory Visit: Payer: Self-pay | Admitting: Cardiology

## 2009-09-22 LAB — CONVERTED CEMR LAB: POC INR: 3

## 2009-10-02 ENCOUNTER — Encounter: Payer: Self-pay | Admitting: Internal Medicine

## 2009-10-19 ENCOUNTER — Ambulatory Visit: Payer: Self-pay | Admitting: Cardiovascular Disease

## 2009-10-19 LAB — CONVERTED CEMR LAB: POC INR: 2.4

## 2009-11-09 ENCOUNTER — Telehealth: Payer: Self-pay | Admitting: Internal Medicine

## 2009-11-17 ENCOUNTER — Ambulatory Visit: Payer: Self-pay | Admitting: Internal Medicine

## 2009-11-17 DIAGNOSIS — I498 Other specified cardiac arrhythmias: Secondary | ICD-10-CM | POA: Insufficient documentation

## 2009-11-17 LAB — CONVERTED CEMR LAB: POC INR: 2.8

## 2009-11-24 ENCOUNTER — Encounter: Payer: Self-pay | Admitting: Internal Medicine

## 2009-12-15 ENCOUNTER — Ambulatory Visit: Payer: Self-pay | Admitting: Cardiovascular Disease

## 2009-12-15 LAB — CONVERTED CEMR LAB: POC INR: 2.3

## 2010-01-12 ENCOUNTER — Ambulatory Visit: Payer: Self-pay | Admitting: Internal Medicine

## 2010-01-12 LAB — CONVERTED CEMR LAB: POC INR: 3

## 2010-02-09 ENCOUNTER — Ambulatory Visit: Payer: Self-pay | Admitting: Internal Medicine

## 2010-02-09 LAB — CONVERTED CEMR LAB: POC INR: 2.5

## 2010-02-15 ENCOUNTER — Telehealth: Payer: Self-pay | Admitting: Internal Medicine

## 2010-03-09 ENCOUNTER — Ambulatory Visit: Payer: Self-pay | Admitting: Internal Medicine

## 2010-03-09 LAB — CONVERTED CEMR LAB: POC INR: 2.3

## 2010-04-05 ENCOUNTER — Encounter (INDEPENDENT_AMBULATORY_CARE_PROVIDER_SITE_OTHER): Payer: Self-pay | Admitting: *Deleted

## 2010-04-05 DIAGNOSIS — L84 Corns and callosities: Secondary | ICD-10-CM | POA: Insufficient documentation

## 2010-04-06 ENCOUNTER — Ambulatory Visit: Payer: Self-pay | Admitting: Internal Medicine

## 2010-04-06 LAB — CONVERTED CEMR LAB: POC INR: 1.7

## 2010-04-11 ENCOUNTER — Encounter (HOSPITAL_BASED_OUTPATIENT_CLINIC_OR_DEPARTMENT_OTHER): Admission: RE | Admit: 2010-04-11 | Discharge: 2010-05-03 | Payer: Self-pay | Admitting: Internal Medicine

## 2010-04-15 ENCOUNTER — Ambulatory Visit (HOSPITAL_COMMUNITY): Admission: RE | Admit: 2010-04-15 | Discharge: 2010-04-15 | Payer: Self-pay | Admitting: Internal Medicine

## 2010-04-18 ENCOUNTER — Encounter: Payer: Self-pay | Admitting: Internal Medicine

## 2010-04-27 ENCOUNTER — Ambulatory Visit: Payer: Self-pay | Admitting: Cardiology

## 2010-04-27 LAB — CONVERTED CEMR LAB: POC INR: 2.1

## 2010-05-18 ENCOUNTER — Ambulatory Visit: Payer: Self-pay | Admitting: Cardiovascular Disease

## 2010-05-18 LAB — CONVERTED CEMR LAB: POC INR: 2.4

## 2010-06-15 ENCOUNTER — Ambulatory Visit: Payer: Self-pay | Admitting: Cardiology

## 2010-06-15 LAB — CONVERTED CEMR LAB: POC INR: 2.1

## 2010-07-18 ENCOUNTER — Ambulatory Visit: Payer: Self-pay | Admitting: Cardiology

## 2010-07-18 LAB — CONVERTED CEMR LAB: POC INR: 2.4

## 2010-08-17 ENCOUNTER — Ambulatory Visit: Payer: Self-pay | Admitting: Cardiology

## 2010-08-17 LAB — CONVERTED CEMR LAB: POC INR: 1.8

## 2010-08-31 ENCOUNTER — Telehealth: Payer: Self-pay | Admitting: Internal Medicine

## 2010-09-07 ENCOUNTER — Ambulatory Visit: Payer: Self-pay | Admitting: Cardiology

## 2010-09-07 LAB — CONVERTED CEMR LAB: POC INR: 2.1

## 2010-09-15 ENCOUNTER — Ambulatory Visit: Payer: Self-pay | Admitting: Internal Medicine

## 2010-10-05 ENCOUNTER — Ambulatory Visit: Payer: Self-pay | Admitting: Cardiovascular Disease

## 2010-10-05 LAB — CONVERTED CEMR LAB: POC INR: 2.4

## 2010-11-02 ENCOUNTER — Ambulatory Visit: Payer: Self-pay | Admitting: Internal Medicine

## 2010-11-02 LAB — CONVERTED CEMR LAB: POC INR: 2.1

## 2010-11-25 ENCOUNTER — Encounter: Payer: Self-pay | Admitting: Internal Medicine

## 2010-11-25 ENCOUNTER — Ambulatory Visit: Payer: Self-pay | Admitting: Internal Medicine

## 2010-11-30 ENCOUNTER — Ambulatory Visit: Admit: 2010-11-30 | Payer: Self-pay

## 2010-12-07 ENCOUNTER — Ambulatory Visit: Admission: RE | Admit: 2010-12-07 | Discharge: 2010-12-07 | Payer: Self-pay | Source: Home / Self Care

## 2010-12-07 LAB — CONVERTED CEMR LAB: POC INR: 1.8

## 2010-12-29 NOTE — Medication Information (Signed)
Summary: rov/tm  Anticoagulant Therapy  Managed by: Weston Brass, PharmD Referring MD: Sharrell Ku PCP: Jacques Navy MD Supervising MD: Riley Kill MD, Maisie Fus Indication 1: Atrial Fibrillation (ICD-427.31) Lab Used: [facility] Site: [facility] INR POC 2.1 INR RANGE 2 - 3  Dietary changes: no    Health status changes: no    Bleeding/hemorrhagic complications: no    Recent/future hospitalizations: no    Any changes in medication regimen? yes       Details: Ciprofloxacin 500mg  twice daily for 10 days per Dr Jarold Motto . calous/blister on foot. Last dose 04/26/10  Recent/future dental: no  Any missed doses?: no       Is patient compliant with meds? yes       Allergies: 1)  ! Percocet  Anticoagulation Management History:      The patient is taking warfarin and comes in today for a routine follow up visit.  Positive risk factors for bleeding include an age of 50 years or older.  The bleeding index is 'intermediate risk'.  Positive CHADS2 values include History of HTN and Age > 37 years old.  The start date was 11/11/2004.  Anticoagulation responsible provider: Riley Kill MD, Maisie Fus.  INR POC: 2.1.  Cuvette Lot#: [id number].  Exp: 06/2011.    Anticoagulation Management Assessment/Plan:      The patient's current anticoagulation dose is Warfarin sodium 2.5 mg tabs: Use as directed by Anticoagualtion Clinic.  The target INR is 2.0-3.0.  The next INR is due 05/18/2010.  Anticoagulation instructions were given to patient.  Results were reviewed/authorized by Weston Brass, PharmD.  She was notified by [facility] [employer].         Prior Anticoagulation Instructions: INR 1.7 Today 7.5mg s( 3 pills)  then resume 2.5mg s daily except 5mg s on Wednesdays. Recheck in 3 weeks.   Current Anticoagulation Instructions: INR-2.1 Resume normal dosing schedule. Take 1 tablet daily except take 2 tablets on wednesday of each week. Recheck in 3 weeks.

## 2010-12-29 NOTE — Letter (Signed)
Summary: pt injured her back  pt injured her back   Imported By: Lester [place] 09/16/2008 12:13:54  _____________________________________________________________________  External Attachment:    Type:   Image     Comment:   External Document

## 2010-12-29 NOTE — Assessment & Plan Note (Signed)
Summary: YEARLY FU/MEDICARE/LEG PROBLEM/BACK PROBLEM/NWS   Vital Signs:  Patient Profile:   77 Years Old Female Height:     62 inches Weight:      199 pounds BMI:     36.53 Temp:     98.8 degrees F oral Pulse rate:   60 / minute BP sitting:   134 / 84  (left arm) Cuff size:   large  Vitals Entered By: Zackery Barefoot CMA (August 25, 2008 2:55 PM)                  PCP:  Jacques Navy MD  Chief Complaint:  Annual visit for disease management.  History of Present Illness: Presents for follow up of multiple chronic problems.  Has pain in the proximal left UE with adduction - relieved with tramadol.  She has been doing well with all of her medications. Coumadin levels have been fine.  There are no specific complaints or new problems which she needs addressed today. Her home situation remains very stable.     Updated Prior Medication List: WARFARIN SODIUM 5 MG  TABS (WARFARIN SODIUM) Take 2.5 mg all days except monday and wednesday 5mg . METOPROLOL TARTRATE 50 MG  TABS (METOPROLOL TARTRATE) Take 1/2 tablet twice daily HYDROCHLOROTHIAZIDE 25 MG  TABS (HYDROCHLOROTHIAZIDE) Take one tablet once daily MULTIVITAMINS   TABS (MULTIPLE VITAMIN) Take one tablet once daily CALCIUM PLUS VITAMIN D 500-50 MG-UNIT  CAPS (CALCIUM CARBONATE-VITAMIN D) Take one tablet twice daily LIPITOR 20 MG  TABS (ATORVASTATIN CALCIUM) Take one tablet at bedtime ANUSOL-HC 25 MG  SUPP (HYDROCORTISONE ACETATE) Take 2 as needed HYDROCODONE-ACETAMINOPHEN 5-500 MG  TABS (HYDROCODONE-ACETAMINOPHEN) Take one tablet every 6-8 hours as needed. ALPRAZOLAM 0.25 MG TABS (ALPRAZOLAM) 1 every 6 hours as needed TRAMADOL HCL 50 MG  TABS (TRAMADOL HCL) 1 by mouth 4 times a day as needed  Current Allergies: No known allergies   Past Medical History:    Reviewed history from 09/06/2007 and no changes required:       HYPERLIPIDEMIA (ICD-272.4)       HYPERTENSION (ICD-401.9)       ATRIAL FIBRILLATION,  PAROXYSMAL (ICD-427.31)          Past Surgical History:    Reviewed history from 09/06/2007 and no changes required:       * CATHETER ABLATION OF ATRIAL FLUTTER       HERNIORRHAPHY, HX OF (ICD-V45.89)       BLADDER SUSPENSION, HX OF (ICD-V45.89)       TOTAL ABDOMINAL HYSTERECTOMY, HX OF (ICD-V45.77)       CHOLECYSTECTOMY, HX OF (ICD-V45.79)       HEMORRHOIDECTOMY, HX OF (ICD-V45.89)   Family History:    father- deceased @ 67: CHF    mother- deceased @74 : CHF    Neg-breast or colon cancer, DM, CAD  Social History:    10th grade    married '51    4 sons - '52, '54, '58, '70; 2 daughters - '56, '64 older girl died ovarian cancer    8 grandchildren, 2 great-grandchildren    retired: Marketing executive.   Risk Factors:  Caffeine use:  1 drinks per day Alcohol use:  no Exercise:  no Seatbelt use:  100 %  Mammogram History:     Date of Last Mammogram:  11/13/2007   Colonoscopy History:     Date of Last Colonoscopy:  09/01/2004    Results:  Diverticulosis, hyperplast polyps    Review of Systems  The patient complains of weight gain.  The patient denies anorexia, fever, weight loss, vision loss, decreased hearing, hoarseness, chest pain, dyspnea on exertion, headaches, abdominal pain, muscle weakness, difficulty walking, depression, and angioedema.     Physical Exam  General:     obese white female NAD Head:     Normocephalic and atraumatic without obvious abnormalities. No apparent alopecia or balding. Eyes:     vision grossly intact, pupils equal, pupils round, corneas and lenses clear, no injection, no iris abnormalities, and no optic disk abnormalities.   Ears:     R ear normal and L ear normal.  Cerumen in both ears. TM's normal. Nose:     no external deformity.   Mouth:     good dentition, no gingival abnormalities, pharynx pink and moist, and no posterior lymphoid hypertrophy.   Neck:     No deformities, masses, or tenderness noted. Chest Wall:     No  deformities, masses, or tenderness noted. Breasts:     No mass, nodules, thickening, tenderness, bulging, retraction, inflamation, nipple discharge or skin changes noted.   Lungs:     normal respiratory effort, no intercostal retractions, no accessory muscle use, normal breath sounds, and no wheezes.   Heart:     normal rate, regular rhythm, no murmur, no rub, and no JVD.   Abdomen:     Obese, soft, BS positive. girth prevents palpation. Msk:     No deformity or scoliosis noted of thoracic or lumbar spine.   Extremities:     No clubbing, cyanosis, edema, or deformity noted with normal full range of motion of all joints.   Neurologic:     alert & oriented X3, cranial nerves II-XII intact, gait normal, and DTRs symmetrical and normal.   Skin:     turgor normal, color normal, no rashes, and no edema.   Cervical Nodes:     No lymphadenopathy noted Axillary Nodes:     No palpable lymphadenopathy Psych:     Oriented X3, memory intact for recent and remote, normally interactive, good eye contact, and not anxious appearing.      Impression & Recommendations:  Problem # 1:  SCIATICA, RIGHT (ICD-724.3) She reports that she has not had recent flares or any limitation in activity. When needed her current regimen of medication is helpful in relieving her discomfort.  Plan: continue present medical regimen.  Her updated medication list for this problem includes:    Hydrocodone-acetaminophen 5-500 Mg Tabs (Hydrocodone-acetaminophen) .Marland Kitchen... Take one tablet every 6-8 hours as needed.    Tramadol Hcl 50 Mg Tabs (Tramadol hcl) .Marland Kitchen... 1 by mouth 4 times a day as needed   Problem # 2:  HYPERLIPIDEMIA (ICD-272.4)  Her updated medication list for this problem includes:    Lipitor 20 Mg Tabs (Atorvastatin calcium) .Marland Kitchen... Take one tablet at bedtime  Labs Reviewed: Chol: 198 (03/17/2008)   HDL: 87.2 (03/17/2008)   LDL: 93 (03/17/2008)   TG: 89 (03/17/2008) SGOT: 30 (03/17/2008)   SGPT: 33  (03/17/2008)  Plan: follow up laboratory with adjustments if needed to maintain goals.   Problem # 3:  HYPERTENSION (ICD-401.9)  Her updated medication list for this problem includes:    Metoprolol Tartrate 50 Mg Tabs (Metoprolol tartrate) .Marland Kitchen... Take 1/2 tablet twice daily    Hydrochlorothiazide 25 Mg Tabs (Hydrochlorothiazide) .Marland Kitchen... Take one tablet once daily  BP today: 134/84 Prior BP: 159/79 (06/03/2008)  Labs Reviewed: Creat: 0.9 (03/17/2008) Chol: 198 (03/17/2008)   HDL: 87.2 (03/17/2008)  LDL: 93 (03/17/2008)   TG: 89 (03/17/2008)  Adequate control on present medications. Plan - continue present medications.   Problem # 4:  ATRIAL FIBRILLATION, PAROXYSMAL (ICD-427.31) Patient in sinus rhythm today. She is s/p ablation. Her anticoagulation is managed in the coag clinic.  Plan: per cardiology.  Her updated medication list for this problem includes:    Warfarin Sodium 5 Mg Tabs (Warfarin sodium) .Marland Kitchen... Take 2.5 mg all days except monday and wednesday 5mg .    Metoprolol Tartrate 50 Mg Tabs (Metoprolol tartrate) .Marland Kitchen... Take 1/2 tablet twice daily   Problem # 5:  Preventive Health Care (ICD-V70.0) Current with breast health - will be due for mammogram in December '09. Current with colorectal cancer screening.  She is aware of the need to manage her weight: smart food choices, portion size control and some walking or other aerobic exercise.  In summary: a very pleasant woman who is medically stable. She will be notified under seperate cover of her lab results and any recommendations. She will return in 1 year or as needed.  Complete Medication List: 1)  Warfarin Sodium 5 Mg Tabs (Warfarin sodium) .... Take 2.5 mg all days except monday and wednesday 5mg . 2)  Metoprolol Tartrate 50 Mg Tabs (Metoprolol tartrate) .... Take 1/2 tablet twice daily 3)  Hydrochlorothiazide 25 Mg Tabs (Hydrochlorothiazide) .... Take one tablet once daily 4)  Multivitamins Tabs (Multiple vitamin) ....  Take one tablet once daily 5)  Calcium Plus Vitamin D 500-50 Mg-unit Caps (Calcium carbonate-vitamin d) .... Take one tablet twice daily 6)  Lipitor 20 Mg Tabs (Atorvastatin calcium) .... Take one tablet at bedtime 7)  Anusol-hc 25 Mg Supp (Hydrocortisone acetate) .... Take 2 as needed 8)  Hydrocodone-acetaminophen 5-500 Mg Tabs (Hydrocodone-acetaminophen) .... Take one tablet every 6-8 hours as needed. 9)  Alprazolam 0.25 Mg Tabs (Alprazolam) .Marland Kitchen.. 1 every 6 hours as needed 10)  Tramadol Hcl 50 Mg Tabs (Tramadol hcl) .Marland Kitchen.. 1 by mouth 4 times a day as needed    ]

## 2010-12-29 NOTE — Medication Information (Signed)
Summary: rov.mp  Anticoagulant Therapy  Managed by: Weston Brass, PharmD Referring MD: Sharrell Ku PCP: Jacques Navy MD Supervising MD: Clifton James MD, Cristal Deer Indication 1: Atrial Fibrillation (ICD-427.31) Lab Used: [facility] Site: [facility] INR POC 2.8 INR RANGE 2 - 3  Dietary changes: no    Health status changes: no    Bleeding/hemorrhagic complications: no    Recent/future hospitalizations: no    Any changes in medication regimen? no    Recent/future dental: no  Any missed doses?: no       Is patient compliant with meds? yes       Allergies (verified): 1)  ! Percocet  Anticoagulation Management History:      The patient is taking warfarin and comes in today for a routine follow up visit.  Positive risk factors for bleeding include an age of 75 years or older.  The bleeding index is 'intermediate risk'.  Positive CHADS2 values include History of HTN and Age > 80 years old.  The start date was 11/11/2004.  Anticoagulation responsible provider: Clifton James MD, Cristal Deer.  INR POC: 2.8.  Cuvette Lot#: [id number].  Exp: 09/2010.    Anticoagulation Management Assessment/Plan:      The patient's current anticoagulation dose is Warfarin sodium 2.5 mg tabs: Use as directed by Anticoagualtion Clinic.  The target INR is 2.0-3.0.  The next INR is due 09/22/2009.  Anticoagulation instructions were given to patient.  Results were reviewed/authorized by Weston Brass, PharmD.  She was notified by Massie Bougie, PharmD Cand..         Prior Anticoagulation Instructions: INR 2.2  Continue 2 tabs each Wednesday and 1 tab on all other days.    Recheck on  08/25/2009.    Current Anticoagulation Instructions: INR 2.8  The patient is to continue with the same dose of coumadin.  This dosage includes:

## 2010-12-29 NOTE — Medication Information (Signed)
Summary: rov/tm  Anticoagulant Therapy  Managed by: Bethena Midget, RN, BSN Referring MD: Sharrell Ku PCP: Jacques Navy MD Supervising MD: Eden Emms MD, Theron Arista Indication 1: Atrial Fibrillation (ICD-427.31) Lab Used: [facility] Site: [facility] INR POC 2.4 INR RANGE 2 - 3  Dietary changes: no    Health status changes: no    Bleeding/hemorrhagic complications: no    Recent/future hospitalizations: no    Any changes in medication regimen? no    Recent/future dental: no  Any missed doses?: no       Is patient compliant with meds? yes       Allergies: 1)  ! Percocet  Anticoagulation Management History:      The patient is taking warfarin and comes in today for a routine follow up visit.  Positive risk factors for bleeding include an age of 75 years or older.  The bleeding index is 'intermediate risk'.  Positive CHADS2 values include History of HTN and Age > 69 years old.  The start date was 11/11/2004.  Anticoagulation responsible provider: Eden Emms MD, Theron Arista.  INR POC: 2.4.  Cuvette Lot#: [id number].  Exp: 09/2011.    Anticoagulation Management Assessment/Plan:      The patient's current anticoagulation dose is Warfarin sodium 2.5 mg tabs: Use as directed by Anticoagualtion Clinic.  The target INR is 2.0-3.0.  The next INR is due 11/02/2010.  Anticoagulation instructions were given to patient.  Results were reviewed/authorized by Bethena Midget, RN, BSN.  She was notified by Bethena Midget, RN, BSN.         Prior Anticoagulation Instructions: INR 2.1 Continue 2.5mg s daily except 5mg s on Wednesdays. Recheck in 4 weeks.   Current Anticoagulation Instructions: INR 2.4 Continue 2.5mg s daily except 5mg s on Wednesdays. Recheck in 4 weeks.

## 2010-12-29 NOTE — Letter (Signed)
   [facility] [address] [place], [place]  [address] Phone: [phone]      March 19, 2008   [facility] [address] [place], [place] [address]  RE:  LAB RESULTS  Dear  Ms. Hellmer,  The following is an interpretation of your most recent lab tests.  Please take note of any instructions provided or changes to medications that have resulted from your lab work.  ELECTROLYTES:  Good - no changes needed  KIDNEY FUNCTION TESTS:  Good - no changes needed  LIVER FUNCTION TESTS:  Good - no changes needed  LIPID PANEL:  Good - no changes needed Triglyceride: 89   Cholesterol: 198   LDL: 93   HDL: 87.2   Chol/HDL%:  2.3 CALC  THYROID STUDIES:  Thyroid studies normal TSH: 3.72     DIABETIC STUDIES:  Good - no changes needed Blood Glucose: 112     Your labs look just fine.  I regret that my schedule is such that you got bumped until July. However, the lab work indicates things are going well. For any acute problem I will see you at any time!  Call or e-mail me if you have questions ([email or web address])    Sincerely Yours,    Jacques Navy MD

## 2010-12-29 NOTE — Miscellaneous (Signed)
Summary: [facility]  [facility]   Imported By: Sherian Rein 08/26/2009 12:24:21  _____________________________________________________________________  External Attachment:    Type:   Image     Comment:   External Document

## 2010-12-29 NOTE — Letter (Signed)
Summary: [facility]  [facility]   Imported By: Lester Sterling 11/12/2008 12:21:35  _____________________________________________________________________  External Attachment:    Type:   Image     Comment:   External Document

## 2010-12-29 NOTE — Medication Information (Signed)
Summary: rov/tm  Anticoagulant Therapy  Managed by: Cloyde Reams, RN, BSN Referring MD: Sharrell Ku PCP: Jacques Navy MD Supervising MD: Johney Frame MD, Fayrene Fearing Indication 1: Atrial Fibrillation (ICD-427.31) Lab Used: [facility] Site: [facility] INR POC 2.5 INR RANGE 2 - 3  Dietary changes: no    Health status changes: no    Bleeding/hemorrhagic complications: no    Recent/future hospitalizations: no    Any changes in medication regimen? no    Recent/future dental: no  Any missed doses?: yes     Details: Missed 1 dose 1-2 weeks ago.    Is patient compliant with meds? yes       Allergies (verified): 1)  ! Percocet  Anticoagulation Management History:      The patient is taking warfarin and comes in today for a routine follow up visit.  Positive risk factors for bleeding include an age of 22 years or older.  The bleeding index is 'intermediate risk'.  Positive CHADS2 values include History of HTN and Age > 75 years old.  The start date was 11/11/2004.  Anticoagulation responsible provider: Hally Colella MD, Fayrene Fearing.  INR POC: 2.5.  Cuvette Lot#: [id number].  Exp: 03/2011.    Anticoagulation Management Assessment/Plan:      The patient's current anticoagulation dose is Warfarin sodium 2.5 mg tabs: Use as directed by Anticoagualtion Clinic.  The target INR is 2.0-3.0.  The next INR is due 03/09/2010.  Anticoagulation instructions were given to patient.  Results were reviewed/authorized by Cloyde Reams, RN, BSN.  She was notified by Cloyde Reams RN.         Prior Anticoagulation Instructions: INR 3.0  Take 1 tablet today then resume same dosage 1 tablet daily except 2 tablets on Wednesdays.  Recheck in 4 weeks.    Current Anticoagulation Instructions: INR 2.5  Continue on same dosage 1 tablet daily except 2 tablets on Wednesdays.  Recheck in 4 weeks.

## 2010-12-29 NOTE — Miscellaneous (Signed)
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

## 2010-12-29 NOTE — Progress Notes (Signed)
Summary: Req for apt  Medications Added ZOCOR 20 MG TABS (SIMVASTATIN)        Phone Note Call from Patient Call back at Home Phone [phone]   Summary of Call: Patient is requesting a CPX with Dr Debby Bud before the end of the year. Pt is having back, leg & hip pain off and on and has other things she would like to talk to you about. She is also conserned about her liver b/c she is on Lipitor. Initial call taken by: Lamar Sprinkles,  September 23, 2007 4:38 PM  Follow-up for Phone Call        ok for appointment at first available.  Follow-up by: Jacques Navy MD,  September 23, 2007 4:47 PM  Additional Follow-up for Phone Call Additional follow up Details #1::        Can you please schedule Additional Follow-up by: Lamar Sprinkles,  September 23, 2007 5:21 PM    Additional Follow-up for Phone Call Additional follow up Details #2::    PT HAS AN APPT. ON  NOV. 12, 2008 FOR A YEARLY FU. PT IS AWARE/NWS Follow-up by: Hilarie Fredrickson,  September 24, 2007 8:41 AM  New/Updated Medications: ZOCOR 20 MG TABS (SIMVASTATIN)

## 2010-12-29 NOTE — Consult Note (Signed)
Summary: Diabetic Eye Exam-Dr. Lita Mains. [place]  Diabetic Eye Exam-Dr. Lita Mains. [place]olan   Imported By: Maryln Gottron 07/28/2008 15:09:52  _____________________________________________________________________  External Attachment:    Type:   Image     Comment:   External Document

## 2010-12-29 NOTE — Medication Information (Signed)
Summary: rov/lhk  Anticoagulant Therapy  Managed by: Cloyde Reams, RN, BSN Referring MD: Sharrell Ku PCP: Jacques Navy MD Supervising MD: Shirlee Latch MD, Junaid Wurzer Indication 1: Atrial Fibrillation (ICD-427.31) Lab Used: [facility] Site: [facility] INR POC 3.0 INR RANGE 2 - 3  Dietary changes: no    Health status changes: no    Bleeding/hemorrhagic complications: no    Recent/future hospitalizations: no    Any changes in medication regimen? no    Recent/future dental: no  Any missed doses?: no       Is patient compliant with meds? yes       Current Medications (verified): 1)  Warfarin Sodium 2.5 Mg Tabs (Warfarin Sodium) .... Use As Directed By Anticoagualtion Clinic 2)  Metoprolol Tartrate 50 Mg  Tabs (Metoprolol Tartrate) .... Take 1/2 Tablet Twice Daily 3)  Hydrochlorothiazide 25 Mg  Tabs (Hydrochlorothiazide) .... Take One Tablet Once Daily 4)  Multivitamins   Tabs (Multiple Vitamin) .... Take One Tablet Once Daily 5)  Calcium Plus Vitamin D 500-50 Mg-Unit  Caps (Calcium Carbonate-Vitamin D) .... Take One Tablet Twice Daily 6)  Lipitor 20 Mg  Tabs (Atorvastatin Calcium) .... Take One Tablet At Bedtime 7)  Anusol-Hc 25 Mg  Supp (Hydrocortisone Acetate) .... Take 2 As Needed 8)  Hydrocodone-Acetaminophen 5-500 Mg  Tabs (Hydrocodone-Acetaminophen) .... Take One Tablet Every 6-8 Hours As Needed. 9)  Alprazolam 0.25 Mg Tabs (Alprazolam) .Marland Kitchen.. 1 Every 6 Hours As Needed 10)  Tramadol Hcl 50 Mg  Tabs (Tramadol Hcl) .Marland Kitchen.. 1 By Mouth 4 Times A Day As Needed 11)  Cyclobenzaprine Hcl 10 Mg Tabs (Cyclobenzaprine Hcl) .... 1/2 or 1 At Bedtime and Q 6 As Needed Back Spasm. 12)  Amiodarone Hcl 200 Mg Tabs (Amiodarone Hcl) .Marland Kitchen.. 1 By Mouth Once Daily As Directed (Current:  1 Tab Daily / 0. 5 Tabs Fri/sat/sun) 13)  Iron 325 (65 Fe) Mg Tabs (Ferrous Sulfate) .Marland Kitchen.. 1 By Mouth Once Daily 14)  Aleve 220 Mg Caps (Naproxen Sodium) .... No More That 3 in 24 Hours 15)  Alprazolam 0.25 Mg Tabs  (Alprazolam) .Marland Kitchen.. 1 By Mouth Q6 As Needed  Allergies (verified): 1)  ! Percocet  Anticoagulation Management History:      The patient is taking warfarin and comes in today for a routine follow up visit.  Positive risk factors for bleeding include an age of 75 years or older.  The bleeding index is 'intermediate risk'.  Positive CHADS2 values include History of HTN and Age > 75 years old.  The start date was 11/11/2004.  Anticoagulation responsible provider: Shirlee Latch MD, Aparna Vanderweele.  INR POC: 3.0.  Cuvette Lot#: [id number].  Exp: 08/2010.    Anticoagulation Management Assessment/Plan:      The patient's current anticoagulation dose is Warfarin sodium 2.5 mg tabs: Use as directed by Anticoagualtion Clinic.  The target INR is 2.0-3.0.  The next INR is due 10/19/2009.  Anticoagulation instructions were given to patient.  Results were reviewed/authorized by Cloyde Reams, RN, BSN.  She was notified by Cloyde Reams RN.         Prior Anticoagulation Instructions: INR 2.8  The patient is to continue with the same dose of coumadin.  This dosage includes:   Current Anticoagulation Instructions: INR 3.0  Take 1 tablet today then resume same dosage 1 tablet daily except 2 tablets on Wednesdays.  Recheck in 4 weeks.

## 2010-12-29 NOTE — Letter (Signed)
Summary: Handout Printed  Printed Handout:  - Coumadin Instructions 

## 2010-12-29 NOTE — Miscellaneous (Signed)
Summary: [facility] for [facility]   Referral for [facility]   Imported By: Sherian Rein 06/11/2009 12:58:48  _____________________________________________________________________  External Attachment:    Type:   Image     Comment:   External Document

## 2010-12-29 NOTE — Miscellaneous (Signed)
Summary: [facility]   Imported By: Lester Kand[place]ohi 09/15/2009 09:07:41  _____________________________________________________________________  External Attachment:    Type:   Image     Comment:   External Document

## 2010-12-29 NOTE — Progress Notes (Signed)
Summary: ortho md  Phone Note Call from Patient   Caller: Patient Reason for Call: Talk to Doctor Summary of Call: Pt states she broke her arm. want to ask md who he recommend for orthopedic md. Initial call taken by: Orlan Leavens,  May 05, 2009 3:11 PM  Follow-up for Phone Call        Called pt back spoke with husband md is out of office will be back tomorrow. will call back with info as soon as he ansew msg Follow-up by: Orlan Leavens,  May 05, 2009 3:13 PM  Additional Follow-up for Phone Call Additional follow up Details #1::        Dr.Mat Charlann Boxer at [facility] ortho Additional Follow-up by: Jacques Navy MD,  May 06, 2009 6:31 AM  New Problems: AFTERCARE HEALING TRAUMATIC FRACTURE ARM UNSPEC (ICD-V54.10)   Additional Follow-up for Phone Call Additional follow up Details #2::    L/M for c/b............................Marland KitchenBeola Cord, CMA  May 06, 2009 4:02 PM    Spoke with patient's husband. Husband state he will have wife c/b office..................Marland KitchenBeola Cord, CMA  May 06, 2009 4:04 PM   Pt is in [place], [place] 4/12 and fx humerous & wrist. Has been in physical therapy. She is req referral for out patient therapy when she returns home. She should be home the week of the 21st.  Follow-up by: Lamar Sprinkles,  May 06, 2009 5:24 PM  Additional Follow-up for Phone Call Additional follow up Details #3:: Details for Additional Follow-up Action Taken: [facility]. [facility] notified Additional Follow-up by: Jacques Navy MD,  May 07, 2009 10:13 AM  New Problems: AFTERCARE HEALING TRAUMATIC FRACTURE ARM UNSPEC (ICD-V54.10)

## 2010-12-29 NOTE — Medication Information (Signed)
Summary: rov/ewj  Anticoagulant Therapy  Managed by: Bethena Midget, RN, BSN Referring MD: Sharrell Ku PCP: Jacques Navy MD Supervising MD: Johney Frame MD, Fayrene Fearing Indication 1: Atrial Fibrillation (ICD-427.31) Lab Used: [facility] Site: [facility] INR POC 2.3 INR RANGE 2 - 3  Dietary changes: no    Health status changes: no    Bleeding/hemorrhagic complications: no    Recent/future hospitalizations: no    Any changes in medication regimen? no    Recent/future dental: no  Any missed doses?: yes     Details: Thinks she missd yesterday's dose.  Is patient compliant with meds? yes       Allergies: 1)  ! Percocet  Anticoagulation Management History:      The patient is taking warfarin and comes in today for a routine follow up visit.  Positive risk factors for bleeding include an age of 74 years or older.  The bleeding index is 'intermediate risk'.  Positive CHADS2 values include History of HTN and Age > 66 years old.  The start date was 11/11/2004.  Anticoagulation responsible provider: Chelsie Burel MD, Fayrene Fearing.  INR POC: 2.3.  Cuvette Lot#: [id number].  Exp: 03/2011.    Anticoagulation Management Assessment/Plan:      The patient's current anticoagulation dose is Warfarin sodium 2.5 mg tabs: Use as directed by Anticoagualtion Clinic.  The target INR is 2.0-3.0.  The next INR is due 04/06/2010.  Anticoagulation instructions were given to patient.  Results were reviewed/authorized by Bethena Midget, RN, BSN.  She was notified by Bethena Midget, RN, BSN.         Prior Anticoagulation Instructions: INR 2.5  Continue on same dosage 1 tablet daily except 2 tablets on Wednesdays.  Recheck in 4 weeks.    Current Anticoagulation Instructions: INR 2.3 Tomorrow take 5mg s. Continue 2.5mg s daily except 5mg s on Wednesdays. Recheck in 4 weeks.

## 2010-12-29 NOTE — Letter (Signed)
Summary: RE: [facility]  RE: [facility]   Imported By: Lester Montezuma 10/01/2008 13:21:04  _____________________________________________________________________  External Attachment:    Type:   Image     Comment:   External Document

## 2010-12-29 NOTE — Medication Information (Signed)
Summary: rov/sp  Anticoagulant Therapy  Managed by: Weston Brass, PharmD Referring MD: Sharrell Ku PCP: Jacques Navy MD Supervising MD: Johney Frame MD, Fayrene Fearing Indication 1: Atrial Fibrillation (ICD-427.31) Lab Used: [facility] Site: [facility] INR POC 1.8 INR RANGE 2 - 3  Dietary changes: yes       Details: Extra Vitamin K   Health status changes: no    Bleeding/hemorrhagic complications: no    Recent/future hospitalizations: no    Any changes in medication regimen? no    Recent/future dental: no  Any missed doses?: no       Is patient compliant with meds? yes       Allergies: 1)  ! Percocet  Anticoagulation Management History:      The patient is taking warfarin and comes in today for a routine follow up visit.  Positive risk factors for bleeding include an age of 75 years or older.  The bleeding index is 'intermediate risk'.  Positive CHADS2 values include History of HTN and Age > 75 years old.  The start date was 11/11/2004.  Anticoagulation responsible provider: Laurie Lovejoy MD, Fayrene Fearing.  INR POC: 1.8.  Cuvette Lot#: [id number].  Exp: 12/2011.    Anticoagulation Management Assessment/Plan:      The patient's current anticoagulation dose is Warfarin sodium 2.5 mg tabs: Use as directed by Anticoagualtion Clinic.  The target INR is 2.0-3.0.  The next INR is due 01/04/2011.  Anticoagulation instructions were given to patient.  Results were reviewed/authorized by Weston Brass, PharmD.  She was notified by Stephannie Peters, PharmD Candidate .         Prior Anticoagulation Instructions: INR 2.1  Continue same dose of 1 tablet every day except 2 tablets on Wednesday.  Recheck INR in 4 weeks.   Current Anticoagulation Instructions: INR 1.8  Coumadin 2.5 mg tablets -  Take 2 tablets today and 1.5 tablets tomorrow, then resume 1 tablet every day except 2 tablets on Wednesdays

## 2010-12-29 NOTE — Medication Information (Signed)
Summary: [name] [name]  Anticoagulant Therapy  Managed by: Lew Dawes, PharmD Candidate Referring MD: Sharrell Ku PCP: Jacques Navy MD Supervising MD: Eden Emms MD, Theron Arista Indication 1: Atrial Fibrillation (ICD-427.31) Lab Used: [facility] Site: [facility] INR POC 2.3 INR RANGE 2 - 3  Dietary changes: no    Health status changes: no    Bleeding/hemorrhagic complications: no    Recent/future hospitalizations: no    Any changes in medication regimen? no    Recent/future dental: no  Any missed doses?: no       Is patient compliant with meds? yes       Allergies: 1)  ! Percocet  Anticoagulation Management History:      The patient is taking warfarin and comes in today for a routine follow up visit.  Positive risk factors for bleeding include an age of 76 years or older.  The bleeding index is 'intermediate risk'.  Positive CHADS2 values include History of HTN and Age > 46 years old.  The start date was 11/11/2004.  Anticoagulation responsible provider: Eden Emms MD, Theron Arista.  INR POC: 2.3.  Cuvette Lot#: [id number].  Exp: 02/2011.    Anticoagulation Management Assessment/Plan:      The patient's current anticoagulation dose is Warfarin sodium 2.5 mg tabs: Use as directed by Anticoagualtion Clinic.  The target INR is 2.0-3.0.  The next INR is due 01/12/2010.  Anticoagulation instructions were given to patient.  Results were reviewed/authorized by Lew Dawes, PharmD Candidate.  She was notified by Lew Dawes, PharmD Candidate.         Prior Anticoagulation Instructions: INR 2.8  Continue on same dosage 1 tablet daily except 2 tablets on Wednesdays.   Recheck in 4 weeks.  Current Anticoagulation Instructions: INR 2.3  Continue same dose of 1 tablet daily except 2 tablets on Wednesdays. Recheck in 4 weeks.

## 2010-12-29 NOTE — Medication Information (Signed)
[facility]   Imported By: Maryln Gottron 01/24/2008 14:55:37  _____________________________________________________________________  External Attachment:    Type:   Image     Comment:   External Document

## 2010-12-29 NOTE — Progress Notes (Signed)
Summary: REFILL  Phone Note Refill Request   Refills Requested: Medication #1:  ALPRAZOLAM 0.25 MG TABS 1 every 6 hours as needed Initial call taken by: Lamar Sprinkles, CMA,  November 09, 2009 8:42 AM  Follow-up for Phone Call        OK to refill x 5 Follow-up by: Jacques Navy MD,  November 09, 2009 9:00 AM    Prescriptions: ALPRAZOLAM 0.25 MG TABS (ALPRAZOLAM) 1 every 6 hours as needed  #30 x 5   Entered by:   Lamar Sprinkles, CMA   Authorized by:   Jacques Navy MD   Signed by:   Lamar Sprinkles, CMA on 11/09/2009   Method used:   [facility]phoned to ...       [facility]  [address] [id number]       [address]       [place], [place]  [address]       Ph: [phone] or [phone]       Fax: [phone]   RxID:   [id number]

## 2010-12-29 NOTE — Progress Notes (Signed)
Summary: YELLOW  Phone Note Call from Patient   Summary of Call: Pt is concerned about seeing yellow. When she wakes up the walls and curtians are yellow. She has upcomming eye dr apt but would like to know what Dr Debby Bud thinks.  Initial call taken by: Lamar Sprinkles, CMA,  February 15, 2010 1:35 PM  Follow-up for Phone Call        concern for jaundice from any cause. If eye exam is normal then she will need an offive visit.  Follow-up by: Jacques Navy MD,  February 15, 2010 5:43 PM  Additional Follow-up for Phone Call Additional follow up Details #1::        Pt informed  Additional Follow-up by: Lamar Sprinkles, CMA,  February 15, 2010 6:37 PM

## 2010-12-29 NOTE — Medication Information (Signed)
Summary: rov  Anticoagulant Therapy  Managed by: Weston Brass PharmD Referring MD: Sharrell Ku PCP: Jacques Navy MD Supervising MD: Tenny Craw MD, Gunnar Fusi Indication 1: Atrial Fibrillation (ICD-427.31) Lab Used: [facility] Site: [facility] PT 17.6 INR POC 2.0  Dietary changes: no    Health status changes: no    Bleeding/hemorrhagic complications: no    Recent/future hospitalizations: no    Any changes in medication regimen? no    Recent/future dental: no  Any missed doses?: no       Is patient compliant with meds? yes       Current Medications (verified): 1)  Warfarin Sodium 5 Mg  Tabs (Warfarin Sodium) .... Take 2.5 Mg All Days Except Monday and Wednesday 5mg . 2)  Metoprolol Tartrate 50 Mg  Tabs (Metoprolol Tartrate) .... Take 1/2 Tablet Twice Daily 3)  Hydrochlorothiazide 25 Mg  Tabs (Hydrochlorothiazide) .... Take One Tablet Once Daily 4)  Multivitamins   Tabs (Multiple Vitamin) .... Take One Tablet Once Daily 5)  Calcium Plus Vitamin D 500-50 Mg-Unit  Caps (Calcium Carbonate-Vitamin D) .... Take One Tablet Twice Daily 6)  Lipitor 20 Mg  Tabs (Atorvastatin Calcium) .... Take One Tablet At Bedtime 7)  Anusol-Hc 25 Mg  Supp (Hydrocortisone Acetate) .... Take 2 As Needed 8)  Hydrocodone-Acetaminophen 5-500 Mg  Tabs (Hydrocodone-Acetaminophen) .... Take One Tablet Every 6-8 Hours As Needed. 9)  Alprazolam 0.25 Mg Tabs (Alprazolam) .Marland Kitchen.. 1 Every 6 Hours As Needed 10)  Tramadol Hcl 50 Mg  Tabs (Tramadol Hcl) .Marland Kitchen.. 1 By Mouth 4 Times A Day As Needed 11)  Cyclobenzaprine Hcl 10 Mg Tabs (Cyclobenzaprine Hcl) .... 1/2 or 1 At Bedtime and Q 6 As Needed Back Spasm. 12)  Qc Naproxen Sodium 220 Mg Tabs (Naproxen Sodium) .... 2 By Mouth Two Times A Day. 13)  Amiodarone Hcl 200 Mg Tabs (Amiodarone Hcl) .Marland Kitchen.. 1 By Mouth Once Daily As Directed  Allergies (verified): 1)  ! Percocet  Anticoagulation Management History:      The patient is on coumadin and comes in today for a routine follow  up visit.  Positive risk factors for bleeding include an age of 19 years or older.  The bleeding index is 'intermediate risk'.  Positive CHADS2 values include History of HTN and Age > 79 years old.  The start date was 11/11/2004.    Anticoagulation Management Assessment/Plan:      The patient's current anticoagulation dose is Warfarin sodium 5 mg  tabs: Take 2.5 mg all days except monday and wednesday 5mg ..  The target INR is 2.0-3.0.  She is to have a 06/01/2009.  Anticoagulation instructions were given to patient.  Results were reviewed/authorized by Weston Brass PharmD.  She was notified by Mayo Ao Candidate.         Prior Anticoagulation Instructions: Today INR 1.8 Today Take 5mg s, Then take 2.5mg s daily except 5mg s on Wednesdays.   Current Anticoagulation Instructions: INR today 2.0  Take 2 tablets todays= 5mg   Then continue regular schedule of 1 tablet (2.5mg ) daily except take 2 tablets (5mg ) on Wednesdays.

## 2010-12-29 NOTE — Progress Notes (Signed)
Summary: Xanax rx  Phone Note Call from Patient Call back at Home Phone [phone]   Summary of Call: Pt stopped in office requesting a rx for anxiety due to family emergency. Dr Debby Bud handwrote Alprozolam 0.25mg  #30 1q6 hours as needed. She also needs Lipitor, HCTZ supply but will call back if she can pick up rx. Med list updated & rx given to patient. Initial call taken by: Lamar Sprinkles,  October 04, 2007 1:37 PM  Follow-up for Phone Call        alprazolam 0.25 mg q 6 as needed #30 1 refill Follow-up by: Jacques Navy MD,  October 04, 2007 1:41 PM  Additional Follow-up for Phone Call Additional follow up Details #1::        Rx written Additional Follow-up by: Jacques Navy MD,  October 04, 2007 6:07 PM    New/Updated Medications: ALPRAZOLAM 0.25 MG TABS (ALPRAZOLAM) 1 every 6 hours as needed   Prescriptions: ALPRAZOLAM 0.25 MG TABS (ALPRAZOLAM) 1 every 6 hours as needed  #30 x 0   Entered by:   Lamar Sprinkles   Authorized by:   Jacques Navy MD   Signed by:   Lamar Sprinkles on 10/04/2007   Method used:   Handwritten   RxID:   [id number]

## 2010-12-29 NOTE — Assessment & Plan Note (Signed)
Summary: FLU SHOT/PN  Nurse Visit   Allergies: 1)  ! Percocet  Orders Added: 1)  Flu Vaccine 72yrs + MEDICARE PATIENTS [Q2039] 2)  Administration Flu vaccine - MCR [G0008]      Flu Vaccine Consent Questions     Do you have a history of severe allergic reactions to this vaccine? no    Any prior history of allergic reactions to egg and/or gelatin? no    Do you have a sensitivity to the preservative Thimersol? no    Do you have a past history of Guillan-Barre Syndrome? no    Do you currently have an acute febrile illness? no    Have you ever had a severe reaction to latex? no    Vaccine information given and explained to patient? yes    Are you currently pregnant? no    Lot Number:[id number]   Exp Date:05/27/2011   Site Given  Left Deltoid IM  Lanier Prude, [facility])  September 26, 2010 8:28 AM

## 2010-12-29 NOTE — Assessment & Plan Note (Signed)
Summary: 6 month rov/sl   Primary Provider:  Jacques Navy MD   History of Present Illness: [name] returns today for followup of her atrial flutter/fib and bradycardia.  She has been stable except for injuring her left arm several months ago.  She had surgical repair.  While she was ill, she had no symptoms of atrial flutter.  She denies peripheral edema or c/p or sob.  She has  been under increasing stress as her husband has CA and has been in the hospital over 50 days. No palpitations on amiodarone.  Current Medications (verified): 1)  Warfarin Sodium 2.5 Mg Tabs (Warfarin Sodium) .... Use As Directed By Anticoagualtion Clinic 2)  Metoprolol Tartrate 50 Mg  Tabs (Metoprolol Tartrate) .... Take 1/2 Tablet Twice Daily 3)  Hydrochlorothiazide 25 Mg  Tabs (Hydrochlorothiazide) .... Take One Tablet Once Daily 4)  Multivitamins   Tabs (Multiple Vitamin) .... Take One Tablet Once Daily 5)  Calcium Plus Vitamin D 500-50 Mg-Unit  Caps (Calcium Carbonate-Vitamin D) .... Take One Tablet Twice Daily 6)  Lipitor 20 Mg  Tabs (Atorvastatin Calcium) .... Take One Tablet At Bedtime 7)  Anusol-Hc 25 Mg  Supp (Hydrocortisone Acetate) .... Take 2 As Needed 8)  Hydrocodone-Acetaminophen 5-500 Mg  Tabs (Hydrocodone-Acetaminophen) .... Take One Tablet Every 6-8 Hours As Needed. 9)  Alprazolam 0.25 Mg Tabs (Alprazolam) .Marland Kitchen.. 1 Every 6 Hours As Needed 10)  Tramadol Hcl 50 Mg  Tabs (Tramadol Hcl) .Marland Kitchen.. 1 By Mouth 4 Times A Day As Needed 11)  Cyclobenzaprine Hcl 10 Mg Tabs (Cyclobenzaprine Hcl) .... 1/2 or 1 At Bedtime and Q 6 As Needed Back Spasm. 12)  Amiodarone Hcl 200 Mg Tabs (Amiodarone Hcl) .Marland Kitchen.. 1 By Mouth Once Daily As Directed (Current:  1 Tab Daily / 0. 5 Tabs Fri/sat/sun) 13)  Iron 325 (65 Fe) Mg Tabs (Ferrous Sulfate) .Marland Kitchen.. 1 By Mouth Once Daily 14)  Aleve 220 Mg Caps (Naproxen Sodium) .... No More That 3 in 24 Hours As Needed  Allergies: 1)  ! Percocet  Past History:  Past Medical History: Last  updated: 05/18/2009 AFTERCARE HEALING TRAUMATIC FRACTURE ARM UNSPEC (ICD-V54.10) MALIGNANT MELANOMA SKIN TRUNK EXCEPT SCROTUM (ICD-172.5) ACNE ROSACEA (ICD-695.3) LUMBAR STRAIN, ACUTE (ICD-847.2) SCIATICA, RIGHT (ICD-724.3) * CATHETER ABLATION OF ATRIAL FLUTTER HERNIORRHAPHY, HX OF (ICD-V45.89) HYPERLIPIDEMIA (ICD-272.4) BLADDER SUSPENSION, HX OF (ICD-V45.89) TOTAL ABDOMINAL HYSTERECTOMY, HX OF (ICD-V45.77) CHOLECYSTECTOMY, HX OF (ICD-V45.79) HEMORRHOIDECTOMY, HX OF (ICD-V45.89) HYPERTENSION (ICD-401.9) ATRIAL FIBRILLATION, PAROXYSMAL (ICD-427.31)  Past Surgical History: Last updated: 09/06/2007 * CATHETER ABLATION OF ATRIAL FLUTTER HERNIORRHAPHY, HX OF (ICD-V45.89) BLADDER SUSPENSION, HX OF (ICD-V45.89) TOTAL ABDOMINAL HYSTERECTOMY, HX OF (ICD-V45.77) CHOLECYSTECTOMY, HX OF (ICD-V45.79) HEMORRHOIDECTOMY, HX OF (ICD-V45.89)  Review of Systems  The patient denies chest pain, syncope, dyspnea on exertion, and peripheral edema.    Vital Signs:  Patient profile:   77 year old female Height:      62 inches Pulse rate:   53 / minute BP sitting:   156 / 90  (left arm)  Vitals Entered By: Laurance Flatten CMA (November 17, 2009 3:41 PM)  Physical Exam  General:  Well developed, well nourished, in no acute distress. Head:  normocephalic and atraumatic Eyes:  PERRLA/EOM intact; conjunctiva and lids normal. Mouth:  Teeth, gums and palate normal. Oral mucosa normal. Neck:  Neck supple, no JVD. No masses, thyromegaly or abnormal cervical nodes. Chest Wall:  No deformities, masses, or tenderness noted. Lungs:  Clear bilaterally to auscultation.  No wheezes, rales, or rhonchi. Heart:  RRR with  normal S1 and S2.  No murmurs. Abdomen:  Obese but otherwise normal. Msk:  Back normal, normal gait. Muscle strength and tone normal. Pulses:  pulses normal in all 4 extremities Extremities:  No clubbing or cyanosis. Neurologic:  Alert and oriented x 3.   Impression & Recommendations:   Problem # 1:  ATRIAL FIBRILLATION, PAROXYSMAL (ICD-427.31) Her atrial arrhythmias remain well controlled on low dose amiodarone.  Will continue as she has been. Her updated medication list for this problem includes:    Warfarin Sodium 2.5 Mg Tabs (Warfarin sodium) ..... Use as directed by anticoagualtion clinic    Metoprolol Tartrate 50 Mg Tabs (Metoprolol tartrate) .Marland Kitchen... Take 1/2 tablet twice daily    Amiodarone Hcl 200 Mg Tabs (Amiodarone hcl) .Marland Kitchen... 1 by mouth once daily as directed (current:  1 tab daily / 0. 5 tabs fri/sat/sun)  Problem # 2:  HYPERTENSION (ICD-401.9) Her blood pressure has not been well controlled but she admits to being under increased stress and not following her medical regimen and diet. Her updated medication list for this problem includes:    Metoprolol Tartrate 50 Mg Tabs (Metoprolol tartrate) .Marland Kitchen... Take 1/2 tablet twice daily    Hydrochlorothiazide 25 Mg Tabs (Hydrochlorothiazide) .Marland Kitchen... Take one tablet once daily  Problem # 3:  BRADYCARDIA (ICD-427.89) she is currently asymptomatic.  I discussed the signs which suggest she need to consider PPM.  She will call us for any problems. Her updated medication list for this problem includes:    Warfarin Sodium 2.5 Mg Tabs (Warfarin sodium) ..... Use as directed by anticoagualtion clinic    Metoprolol Tartrate 50 Mg Tabs (Metoprolol tartrate) .Marland Kitchen... Take 1/2 tablet twice daily    Amiodarone Hcl 200 Mg Tabs (Amiodarone hcl) .Marland Kitchen... 1 by mouth once daily as directed (current:  1 tab daily / 0. 5 tabs fri/sat/sun)

## 2010-12-29 NOTE — Medication Information (Signed)
Summary: rov/vb  Anticoagulant Therapy  Managed by: Cloyde Reams, RN, BSN Referring MD: Sharrell Ku PCP: Jacques Navy MD Supervising MD: Tenny Craw MD, Gunnar Fusi Indication 1: Atrial Fibrillation (ICD-427.31) Lab Used: [facility] Site: [facility] INR POC 2.8 INR RANGE 2 - 3  Dietary changes: no    Health status changes: no    Bleeding/hemorrhagic complications: yes       Details: Hemmroidal bleeding, not new.     Any changes in medication regimen? no    Recent/future dental: no  Any missed doses?: yes     Details: Unsure if missed a dose 2 night's ago.  Is patient compliant with meds? yes       Current Medications (verified): 1)  Warfarin Sodium 2.5 Mg Tabs (Warfarin Sodium) .... Use As Directed By Anticoagualtion Clinic 2)  Metoprolol Tartrate 50 Mg  Tabs (Metoprolol Tartrate) .... Take 1/2 Tablet Twice Daily 3)  Hydrochlorothiazide 25 Mg  Tabs (Hydrochlorothiazide) .... Take One Tablet Once Daily 4)  Multivitamins   Tabs (Multiple Vitamin) .... Take One Tablet Once Daily 5)  Calcium Plus Vitamin D 500-50 Mg-Unit  Caps (Calcium Carbonate-Vitamin D) .... Take One Tablet Twice Daily 6)  Lipitor 20 Mg  Tabs (Atorvastatin Calcium) .... Take One Tablet At Bedtime 7)  Anusol-Hc 25 Mg  Supp (Hydrocortisone Acetate) .... Take 2 As Needed 8)  Hydrocodone-Acetaminophen 5-500 Mg  Tabs (Hydrocodone-Acetaminophen) .... Take One Tablet Every 6-8 Hours As Needed. 9)  Alprazolam 0.25 Mg Tabs (Alprazolam) .Marland Kitchen.. 1 Every 6 Hours As Needed 10)  Tramadol Hcl 50 Mg  Tabs (Tramadol Hcl) .Marland Kitchen.. 1 By Mouth 4 Times A Day As Needed 11)  Cyclobenzaprine Hcl 10 Mg Tabs (Cyclobenzaprine Hcl) .... 1/2 or 1 At Bedtime and Q 6 As Needed Back Spasm. 12)  Amiodarone Hcl 200 Mg Tabs (Amiodarone Hcl) .Marland Kitchen.. 1 By Mouth Once Daily As Directed (Current:  1 Tab Daily / 0. 5 Tabs Fri/sat/sun) 13)  Iron 325 (65 Fe) Mg Tabs (Ferrous Sulfate) .Marland Kitchen.. 1 By Mouth Once Daily 14)  Aleve 220 Mg Caps (Naproxen Sodium) .... No More  That 3 in 24 Hours 15)  Alprazolam 0.25 Mg Tabs (Alprazolam) .Marland Kitchen.. 1 By Mouth Q6 As Needed  Allergies (verified): 1)  ! Percocet  Anticoagulation Management History:      The patient is taking warfarin and comes in today for a routine follow up visit.  Positive risk factors for bleeding include an age of 75 years or older.  The bleeding index is 'intermediate risk'.  Positive CHADS2 values include History of HTN and Age > 75 years old.  The start date was 11/11/2004.  Anticoagulation responsible provider: Tenny Craw MD, Gunnar Fusi.  INR POC: 2.8.  Cuvette Lot#: [id number].  Exp: 12/2010.    Anticoagulation Management Assessment/Plan:      The patient's current anticoagulation dose is Warfarin sodium 2.5 mg tabs: Use as directed by Anticoagualtion Clinic.  The target INR is 2.0-3.0.  The next INR is due 12/15/2009.  Anticoagulation instructions were given to patient.  Results were reviewed/authorized by Cloyde Reams, RN, BSN.  She was notified by Cloyde Reams RN.         Prior Anticoagulation Instructions: INR: 2.4  Continue the current dosing regimen of 1 tablet daily except 2 tablets on Wednesdays.  Recheck in 4 weeks.  Current Anticoagulation Instructions: INR 2.8  Continue on same dosage 1 tablet daily except 2 tablets on Wednesdays.   Recheck in 4 weeks.

## 2010-12-29 NOTE — Assessment & Plan Note (Signed)
Summary: 1:30/rov per pt call/lg   Primary Provider:  Jacques Navy MD  CC:  rov.  History of Present Illness: [name] returns today for followup of her atrial flutter and bradycardia.  She has been stable except for injuring her left arm several months ago.  She had surgical repair.  While she was ill, she had no symptoms of atrial flutter.  She denies peripheral edema or c/p or sob.  Current Medications (verified): 1)  Warfarin Sodium 5 Mg  Tabs (Warfarin Sodium) .... Take 2.5 Mg All Days Except Monday and Wednesday 5mg . 2)  Metoprolol Tartrate 50 Mg  Tabs (Metoprolol Tartrate) .... Take 1/2 Tablet Twice Daily 3)  Hydrochlorothiazide 25 Mg  Tabs (Hydrochlorothiazide) .... Take One Tablet Once Daily 4)  Multivitamins   Tabs (Multiple Vitamin) .... Take One Tablet Once Daily 5)  Calcium Plus Vitamin D 500-50 Mg-Unit  Caps (Calcium Carbonate-Vitamin D) .... Take One Tablet Twice Daily 6)  Lipitor 20 Mg  Tabs (Atorvastatin Calcium) .... Take One Tablet At Bedtime 7)  Anusol-Hc 25 Mg  Supp (Hydrocortisone Acetate) .... Take 2 As Needed 8)  Hydrocodone-Acetaminophen 5-500 Mg  Tabs (Hydrocodone-Acetaminophen) .... Take One Tablet Every 6-8 Hours As Needed. 9)  Alprazolam 0.25 Mg Tabs (Alprazolam) .Marland Kitchen.. 1 Every 6 Hours As Needed 10)  Tramadol Hcl 50 Mg  Tabs (Tramadol Hcl) .Marland Kitchen.. 1 By Mouth 4 Times A Day As Needed 11)  Cyclobenzaprine Hcl 10 Mg Tabs (Cyclobenzaprine Hcl) .... 1/2 or 1 At Bedtime and Q 6 As Needed Back Spasm. 12)  Amiodarone Hcl 200 Mg Tabs (Amiodarone Hcl) .Marland Kitchen.. 1 By Mouth Once Daily As Directed 13)  Iron 325 (65 Fe) Mg Tabs (Ferrous Sulfate) .Marland Kitchen.. 1 By Mouth Once Daily 14)  Aleve 220 Mg Caps (Naproxen Sodium) .... No More That 3 in 24 Hours 15)  Alprazolam 0.25 Mg Tabs (Alprazolam) .Marland Kitchen.. 1 By Mouth Q6 As Needed  Allergies (verified): 1)  ! Percocet  Past History:  Past Medical History: Last updated: 05/18/2009 AFTERCARE HEALING TRAUMATIC FRACTURE ARM UNSPEC (ICD-V54.10)  MALIGNANT MELANOMA SKIN TRUNK EXCEPT SCROTUM (ICD-172.5) ACNE ROSACEA (ICD-695.3) LUMBAR STRAIN, ACUTE (ICD-847.2) SCIATICA, RIGHT (ICD-724.3) * CATHETER ABLATION OF ATRIAL FLUTTER HERNIORRHAPHY, HX OF (ICD-V45.89) HYPERLIPIDEMIA (ICD-272.4) BLADDER SUSPENSION, HX OF (ICD-V45.89) TOTAL ABDOMINAL HYSTERECTOMY, HX OF (ICD-V45.77) CHOLECYSTECTOMY, HX OF (ICD-V45.79) HEMORRHOIDECTOMY, HX OF (ICD-V45.89) HYPERTENSION (ICD-401.9) ATRIAL FIBRILLATION, PAROXYSMAL (ICD-427.31)  Past Surgical History: Last updated: 09/06/2007 * CATHETER ABLATION OF ATRIAL FLUTTER HERNIORRHAPHY, HX OF (ICD-V45.89) BLADDER SUSPENSION, HX OF (ICD-V45.89) TOTAL ABDOMINAL HYSTERECTOMY, HX OF (ICD-V45.77) CHOLECYSTECTOMY, HX OF (ICD-V45.79) HEMORRHOIDECTOMY, HX OF (ICD-V45.89)  Review of Systems  The patient denies chest pain, syncope, dyspnea on exertion, and peripheral edema.    Vital Signs:  Patient profile:   77 year old female Height:      62 inches Weight:      183 pounds BMI:     33.59 Pulse rate:   62 / minute Pulse rhythm:   regular BP sitting:   152 / 73  (right arm) Cuff size:   large  Vitals Entered By: Flonnie Overman (May 18, 2009 2:06 PM)  Physical Exam  General:  Well developed, well nourished, in no acute distress. Head:  normocephalic and atraumatic Eyes:  PERRLA/EOM intact; conjunctiva and lids normal. Mouth:  Teeth, gums and palate normal. Oral mucosa normal. Neck:  Neck supple, no JVD. No masses, thyromegaly or abnormal cervical nodes. Lungs:  Clear bilaterally to auscultation and percussion. Heart:  RRR with normal S1 and S2.  No murmurs. Abdomen:  Obese but otherwise normal. Msk:  Back normal, normal gait. Muscle strength and tone normal. Pulses:  pulses normal in all 4 extremities Extremities:  No clubbing or cyanosis. Neurologic:  Alert and oriented x 3.   EKG  Procedure date:  05/19/2009  Findings:      Sinus bradycardia with rate of:  56.  Impression &  Recommendations:  Problem # 1:  ATRIAL FIBRILLATION, PAROXYSMAL (ICD-427.31) Her atrial fibrillation has been well controlled on amiodarone.  Continue. Her updated medication list for this problem includes:    Warfarin Sodium 5 Mg Tabs (Warfarin sodium) .Marland Kitchen... Take 2.5 mg all days except monday and wednesday 5mg .    Metoprolol Tartrate 50 Mg Tabs (Metoprolol tartrate) .Marland Kitchen... Take 1/2 tablet twice daily    Amiodarone Hcl 200 Mg Tabs (Amiodarone hcl) .Marland Kitchen... 1 by mouth once daily as directed  Problem # 2:  ATRIAL FLUTTER (ICD-427.32)  No evidence of recurrent atrial flutter.  Continue meds as below. Her updated medication list for this problem includes:    Warfarin Sodium 5 Mg Tabs (Warfarin sodium) .Marland Kitchen... Take 2.5 mg all days except monday and wednesday 5mg .    Metoprolol Tartrate 50 Mg Tabs (Metoprolol tartrate) .Marland Kitchen... Take 1/2 tablet twice daily    Amiodarone Hcl 200 Mg Tabs (Amiodarone hcl) .Marland Kitchen... 1 by mouth once daily as directed

## 2010-12-29 NOTE — Assessment & Plan Note (Signed)
Summary: f1y   Visit Type:  Follow-up Primary Provider:  Jacques Navy MD   History of Present Illness: [name] [name] returns today for followup of her atrial flutter/fib and bradycardia.  She has been stable since injuring her left arm several months ago.  She had surgical repair.   She denies peripheral edema or c/p or sob.  She has  been under increasing stress as her husband has CA and has been in the hospital over 50 days. No palpitations on amiodarone.  Current Medications (verified): 1)  Warfarin Sodium 2.5 Mg Tabs (Warfarin Sodium) .... Use As Directed By Anticoagualtion Clinic 2)  Metoprolol Tartrate 50 Mg  Tabs (Metoprolol Tartrate) .... Take 1/2 Tablet Twice Daily 3)  Hydrochlorothiazide 25 Mg  Tabs (Hydrochlorothiazide) .... Take One Tablet Once Daily 4)  Multivitamins   Tabs (Multiple Vitamin) .... Take One Tablet Once Daily 5)  Calcium Plus Vitamin D 500-50 Mg-Unit  Caps (Calcium Carbonate-Vitamin D) .... Take One Tablet Twice Daily 6)  Lipitor 20 Mg  Tabs (Atorvastatin Calcium) .... Take One Tablet At Bedtime 7)  Anusol-Hc 25 Mg  Supp (Hydrocortisone Acetate) .... Take 2 As Needed 8)  Hydrocodone-Acetaminophen 5-500 Mg  Tabs (Hydrocodone-Acetaminophen) .... Take One Tablet Every 6-8 Hours As Needed. 9)  Alprazolam 0.25 Mg Tabs (Alprazolam) .Marland Kitchen.. 1 Every 6 Hours As Needed 10)  Tramadol Hcl 50 Mg  Tabs (Tramadol Hcl) .Marland Kitchen.. 1 By Mouth 4 Times A Day As Needed 11)  Cyclobenzaprine Hcl 10 Mg Tabs (Cyclobenzaprine Hcl) .... 1/2 or 1 At Bedtime and Q 6 As Needed Back Spasm. 12)  Amiodarone Hcl 200 Mg Tabs (Amiodarone Hcl) .Marland Kitchen.. 1 By Mouth Once Daily As Directed (Current:  1 Tab Daily / 0. 5 Tabs Fri/sat/sun) 13)  Iron 325 (65 Fe) Mg Tabs (Ferrous Sulfate) .Marland Kitchen.. 1 By Mouth Once Daily 14)  Aleve 220 Mg Caps (Naproxen Sodium) .... No More That 3 in 24 Hours As Needed  Allergies: 1)  ! Percocet  Past History:  Past Medical History: Last updated: 05/18/2009 AFTERCARE HEALING TRAUMATIC  FRACTURE ARM UNSPEC (ICD-V54.10) MALIGNANT MELANOMA SKIN TRUNK EXCEPT SCROTUM (ICD-172.5) ACNE ROSACEA (ICD-695.3) LUMBAR STRAIN, ACUTE (ICD-847.2) SCIATICA, RIGHT (ICD-724.3) * CATHETER ABLATION OF ATRIAL FLUTTER HERNIORRHAPHY, HX OF (ICD-V45.89) HYPERLIPIDEMIA (ICD-272.4) BLADDER SUSPENSION, HX OF (ICD-V45.89) TOTAL ABDOMINAL HYSTERECTOMY, HX OF (ICD-V45.77) CHOLECYSTECTOMY, HX OF (ICD-V45.79) HEMORRHOIDECTOMY, HX OF (ICD-V45.89) HYPERTENSION (ICD-401.9) ATRIAL FIBRILLATION, PAROXYSMAL (ICD-427.31)  Review of Systems  The patient denies chest pain, syncope, dyspnea on exertion, and peripheral edema.    Vital Signs:  Patient profile:   77 year old female Height:      62 inches Weight:      199 pounds BMI:     36.53 Pulse rate:   65 / minute BP sitting:   120 / 60  (left arm)  Vitals Entered By: Laurance Flatten CMA (November 25, 2010 2:43 PM)  Physical Exam  General:  Well developed, well nourished, in no acute distress. Head:  normocephalic and atraumatic Eyes:  PERRLA/EOM intact; conjunctiva and lids normal. Mouth:  Teeth, gums and palate normal. Oral mucosa normal. Neck:  Neck supple, no JVD. No masses, thyromegaly or abnormal cervical nodes. Chest Wall:  No deformities, masses, or tenderness noted. Lungs:  Clear bilaterally to auscultation.  No wheezes, rales, or rhonchi. Heart:  RRR with normal S1 and S2.  No murmurs. Abdomen:  Bowel sounds positive; abdomen soft and non-tender without masses, organomegaly, or hernias noted. No hepatosplenomegaly. Msk:  Back normal, normal gait. Muscle strength  and tone normal. Pulses:  pulses normal in all 4 extremities Extremities:  No clubbing or cyanosis. Neurologic:  Alert and oriented x 3.   EKG  Procedure date:  11/28/2010  Findings:      Normal sinus rhythm with rate of:  65.  Impression & Recommendations:  Problem # 1:  ATRIAL FIBRILLATION, PAROXYSMAL (ICD-427.31) She is maintaining NSR on low dose amiodarone.  Will  continue meds as below. Her updated medication list for this problem includes:    Warfarin Sodium 2.5 Mg Tabs (Warfarin sodium) ..... Use as directed by anticoagualtion clinic    Metoprolol Tartrate 50 Mg Tabs (Metoprolol tartrate) .Marland Kitchen... Take 1/2 tablet twice daily    Amiodarone Hcl 200 Mg Tabs (Amiodarone hcl) .Marland Kitchen... 1 by mouth once daily as directed (current:  1 tab daily / 0. 5 tabs fri/sat/sun)  Problem # 2:  HYPERLIPIDEMIA (ICD-272.4) She will continue a low fat diet and lipitor. Her updated medication list for this problem includes:    Lipitor 20 Mg Tabs (Atorvastatin calcium) .Marland Kitchen... Take one tablet at bedtime  Problem # 3:  HYPERLIPIDEMIA (ICD-272.4)  Patient Instructions: 1)  Your physician recommends that you continue on your current medications as directed. Please refer to the Current Medication list given to you today. 2)  Your physician wants you to follow-up in:1 year   You will receive a reminder letter in the mail two months in advance. If you don't receive a letter, please call our office to schedule the follow-up appointment.

## 2010-12-29 NOTE — Progress Notes (Signed)
  Phone Note Refill Request Message from:  Fax from Pharmacy on August 31, 2010 1:40 PM  Refills Requested: Medication #1:  ALPRAZOLAM 0.25 MG TABS 1 every 6 hours as needed   Last Refilled: 11/09/2009 [facility] from [facility] [address]  Initial call taken by: Ami Bullins CMA,  August 31, 2010 1:41 PM  Follow-up for Phone Call        ok to x 5 Follow-up by: Jacques Navy MD,  August 31, 2010 2:45 PM    Prescriptions: ALPRAZOLAM 0.25 MG TABS (ALPRAZOLAM) 1 every 6 hours as needed  #30 x 5   Entered by:   Ami Bullins CMA   Authorized by:   Jacques Navy MD   Signed by:   Bill Salinas CMA on 08/31/2010   Method used:   Telephoned to ...       [facility]  [address] [id number] ([facility]       [address]       [place], [place]  [address]       Ph: [phone] or [phone]       Fax: [phone]   RxID:   [id number]

## 2010-12-29 NOTE — Medication Information (Signed)
Summary: [name] [name]  Anticoagulant Therapy  Managed by: Elaina Pattee, PharmD Referring MD: Sharrell Ku PCP: Jacques Navy MD Supervising MD: Eden Emms MD, Theron Arista Indication 1: Atrial Fibrillation (ICD-427.31) Lab Used: [facility] Site: [facility] INR POC 2.4 INR RANGE 2 - 3  Dietary changes: no    Health status changes: no    Bleeding/hemorrhagic complications: no    Recent/future hospitalizations: no    Any changes in medication regimen? no    Recent/future dental: no  Any missed doses?: no       Is patient compliant with meds? yes       Allergies: 1)  ! Percocet  Anticoagulation Management History:      The patient is taking warfarin and comes in today for a routine follow up visit.  Positive risk factors for bleeding include an age of 75 years or older.  The bleeding index is 'intermediate risk'.  Positive CHADS2 values include History of HTN and Age > 71 years old.  The start date was 11/11/2004.  Anticoagulation responsible provider: Eden Emms MD, Theron Arista.  INR POC: 2.4.  Cuvette Lot#: [id number].  Exp: 06/2011.    Anticoagulation Management Assessment/Plan:      The patient's current anticoagulation dose is Warfarin sodium 2.5 mg tabs: Use as directed by Anticoagualtion Clinic.  The target INR is 2.0-3.0.  The next INR is due 06/15/2010.  Anticoagulation instructions were given to patient.  Results were reviewed/authorized by Elaina Pattee, PharmD.  She was notified by Elaina Pattee, PharmD.         Prior Anticoagulation Instructions: INR-2.1 Resume normal dosing schedule. Take 1 tablet daily except take 2 tablets on wednesday of each week. Recheck in 3 weeks.    Current Anticoagulation Instructions: INR 2.4. Take 2.5 mg daily except 5 mg on Wednesdays. Recheck in 4 weeks.

## 2010-12-29 NOTE — Progress Notes (Signed)
  Phone Note Outgoing Call   Summary of Call: patient e-mail with new on-set back pain. She is not getting relief from tramadol. Plan continue tramado and use vicodin as needed. If not better call for appt Monday. Initial call taken by: Jacques Navy MD,  September 11, 2008 7:16 PM      Prescriptions: HYDROCODONE-ACETAMINOPHEN 5-500 MG  TABS (HYDROCODONE-ACETAMINOPHEN) Take one tablet every 6-8 hours as needed.  #30 x 1   Entered and Authorized by:   Jacques Navy MD   Signed by:   Jacques Navy MD on 09/11/2008   Method used:   Telephoned to ...       [facility] [address] (retail)       [phone]       [place], [place]  [address]       Ph: [phone]       Fax: [phone]   RxID:   [id number]

## 2010-12-29 NOTE — Miscellaneous (Signed)
Summary: Orders Update  Clinical Lists Changes  Problems: Added new problem of CORNS AND CALLOSITIES (ICD-700) - right great toe - Signed Orders: Added new Referral order of Wound Care Center Referral (Wound Care) - Signed

## 2010-12-29 NOTE — Progress Notes (Signed)
Summary: 90 day rx  Phone Note Refill Request Call back at Home Phone [phone] Call back at [phone]   Refills Requested: Medication #1:  METOPROLOL TARTRATE 50 MG  TABS Take 1/2 tablet twice daily   Notes: 3 mth supply Pt req supply mailed  Initial call taken by: Lamar Sprinkles,  October 31, 2007 9:15 AM  Follow-up for Phone Call        ok for refill as needed  Follow-up by: Jacques Navy MD,  October 31, 2007 10:04 AM  Additional Follow-up for Phone Call Additional follow up Details #1::        90 rx written and mailed to patient. Patient notified [email or web address] Additional Follow-up by: Rock Nephew CMA,  November 01, 2007 9:33 AM      Prescriptions: METOPROLOL TARTRATE 50 MG  TABS (METOPROLOL TARTRATE) Take 1/2 tablet twice daily  #90 x 3   Entered by:   Rock Nephew CMA   Authorized by:   Jacques Navy MD   Signed by:   Rock Nephew CMA on 11/01/2007   Method used:   Print then Mail to Patient   RxID:   [id number]

## 2010-12-29 NOTE — Medication Information (Signed)
Summary: rov/ewj  Anticoagulant Therapy  Managed by: Leota Sauers, PharmD, BCPS, CPP Referring MD: Sharrell Ku PCP: Jacques Navy MD Supervising MD: Excell Seltzer MD, Casimiro Needle Indication 1: Atrial Fibrillation (ICD-427.31) Lab Used: [facility] Site: [facility] INR POC 2.4 INR RANGE 2 - 3  Dietary changes: no    Health status changes: no    Bleeding/hemorrhagic complications: no    Recent/future hospitalizations: no    Any changes in medication regimen? no    Recent/future dental: no  Any missed doses?: no       Is patient compliant with meds? yes       Allergies (verified): 1)  ! Percocet  Anticoagulation Management History:      The patient is taking warfarin and comes in today for a routine follow up visit.  Positive risk factors for bleeding include an age of 53 years or older.  The bleeding index is 'intermediate risk'.  Positive CHADS2 values include History of HTN and Age > 36 years old.  The start date was 11/11/2004.  Anticoagulation responsible provider: Excell Seltzer MD, Casimiro Needle.  INR POC: 2.4.  Cuvette Lot#: [id number].  Exp: 08/2010.    Anticoagulation Management Assessment/Plan:      The patient's current anticoagulation dose is Warfarin sodium 2.5 mg tabs: Use as directed by Anticoagualtion Clinic.  The target INR is 2.0-3.0.  The next INR is due 11/16/2009.  Anticoagulation instructions were given to patient.  Results were reviewed/authorized by Leota Sauers, PharmD, BCPS, CPP.  She was notified by Dani Gobble, Pharmacy Student.         Prior Anticoagulation Instructions: INR 3.0  Take 1 tablet today then resume same dosage 1 tablet daily except 2 tablets on Wednesdays.  Recheck in 4 weeks.    Current Anticoagulation Instructions: INR: 2.4  Continue the current dosing regimen of 1 tablet daily except 2 tablets on Wednesdays.  Recheck in 4 weeks.

## 2010-12-29 NOTE — Medication Information (Signed)
Summary: [email or web address]  Anticoagulant Therapy  Managed by: Shelby Dubin, PharmD, BCPS, CPP Referring MD: Sharrell Ku PCP: Jacques Navy MD Supervising MD: Myrtis Ser MD, Tinnie Gens Indication 1: Atrial Fibrillation (ICD-427.31) Lab Used: [facility] Site: [facility] PT 19.3 INR POC 2.5 INR RANGE 2 - 3  Dietary changes: no    Health status changes: no    Bleeding/hemorrhagic complications: yes       Details: R subconjunctival hemorrhage--now resolving  Recent/future hospitalizations: no    Any changes in medication regimen? no    Recent/future dental: no  Any missed doses?: no       Is patient compliant with meds? yes       Allergies: 1)  ! Percocet  Anticoagulation Management History:      The patient is taking warfarin and comes in today for a routine follow up visit.  Positive risk factors for bleeding include an age of 75 years or older.  The bleeding index is 'intermediate risk'.  Positive CHADS2 values include History of HTN and Age > 11 years old.  The start date was 11/11/2004.  Prothrombin time is 19.3.  Anticoagulation responsible provider: Myrtis Ser MD, Tinnie Gens.  INR POC: 2.5.  Exp: 05/2010.    Anticoagulation Management Assessment/Plan:      The patient's current anticoagulation dose is Warfarin sodium 5 mg  tabs: Take 2.5 mg all days except monday and wednesday 5mg ..  The target INR is 2.0-3.0.  The next INR is due 06/29/2009.  Anticoagulation instructions were given to patient.  Results were reviewed/authorized by Shelby Dubin, PharmD, BCPS, CPP.  She was notified by Shelby Dubin PharmD, BCPS, CPP.         Prior Anticoagulation Instructions: INR today 2.0  Take 2 tablets todays= 5mg   Then continue regular schedule of 1 tablet (2.5mg ) daily except take 2 tablets (5mg ) on Wednesdays.    Current Anticoagulation Instructions: INR 2.5  Coumadin 2.5 mg daily except 5 mg Wednesdays.

## 2010-12-29 NOTE — Progress Notes (Signed)
Summary: PT  Phone Note Call from Patient   Summary of Call: Patient is requesting new order. [facility] asked pt to call us to req this. She needs order (eval & treat, OT & PT) to go to [facility] for left shoulder, arm & wrist. (last order was only for arm) THANKS Initial call taken by: Lamar Sprinkles,  May 18, 2009 10:11 AM  Follow-up for Phone Call        OK to give a verbal as requested. Follow-up by: Jacques Navy MD,  May 18, 2009 12:38 PM  Additional Follow-up for Phone Call Additional follow up Details #1::        Order written for signature Additional Follow-up by: Lamar Sprinkles,  May 18, 2009 4:32 PM    Additional Follow-up for Phone Call Additional follow up Details #2::    Signed, to be faxed tomorrow Follow-up by: Lamar Sprinkles,  May 18, 2009 7:09 PM

## 2010-12-29 NOTE — Letter (Signed)
   [facility] [address] [place], [place]  [address] Phone: [phone]      August 31, 2008   [facility] [address] [place], [place] [address]  RE:  LAB RESULTS  Dear  Ms. Simson,  The following is an interpretation of your most recent lab tests.  Please take note of any instructions provided or changes to medications that have resulted from your lab work.  ELECTROLYTES:  Good - no changes needed  KIDNEY FUNCTION TESTS:  Good - no changes needed  LIVER FUNCTION TESTS:  Good - no changes needed  LIPID PANEL:  Good - no changes needed Triglyceride: 64   Cholesterol: 170   LDL: 88   HDL: 68.9   Chol/HDL%:  2.5 CALC  THYROID STUDIES:  Thyroid studies normal TSH: 3.61     DIABETIC STUDIES:  Improved - continue management Blood Glucose: 129   HgbA1C: 6.3     CBC:  Good - no changes needed   Lab results look fine.  Call or e-mail me if you have questions ([email or web address]).,   Sincerely Yours,    Jacques Navy MD

## 2010-12-29 NOTE — Progress Notes (Signed)
Summary: appt  Phone Note Call from Patient Call back at [phone]   Caller: Patient Summary of Call: Hurt her back 2 weeks ago and was told by you if she was not feeling better by Monday then to give a call early Tues. so she can be worked in for and appt...Marland KitchenMarland KitchenShe is calling early because she wants to be worked in for an appt but can not come till after 1pm she is out of town. Initial call taken by: Windell Norfolk,  September 15, 2008 9:42 AM  Follow-up for Phone Call        spoke with patient and she is aware of appt at 3:30 as a add on .Marland Kitchen...she accepted appt Follow-up by: Windell Norfolk,  September 15, 2008 10:47 AM

## 2010-12-29 NOTE — Assessment & Plan Note (Signed)
Summary: R LEG STABBING PAIN AROUND HIP--$50-STC   Vital Signs:  Patient Profile:   77 Years Old Female Height:     62 inches Weight:      198 pounds Temp:     98.8 degrees F oral Pulse rate:   65 / minute BP sitting:   159 / 79  (left arm) Cuff size:   regular  Vitals Entered By: Zackery Barefoot CMA (June 03, 2008 1:58 PM)                 PCP:  Jacques Navy MD  Chief Complaint:  Right hip pain radiating down to foot x 2 weeks.  History of Present Illness: Patient with pain the radiates from uttock to the thigh, knee, shin, ankle foot. This is positional. It is worst when laying down on her side, decreased back pain when supine. Prolonged sitting also causes pain. Gets some relief with hydrocodone. Patient is on coumadin, thus not a good candidate for NSAIDs. 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 She will get a minor paresthesia right distal LE. She does have calluses on the plantar aspect of the right foot and she knows this throws her gait off.     Updated Prior Medication List: WARFARIN SODIUM 5 MG  TABS (WARFARIN SODIUM) Take 2.5 mg all days except monday and wednesday 5mg . METOPROLOL TARTRATE 50 MG  TABS (METOPROLOL TARTRATE) Take 1/2 tablet twice daily HYDROCHLOROTHIAZIDE 25 MG  TABS (HYDROCHLOROTHIAZIDE) Take one tablet once daily MULTIVITAMINS   TABS (MULTIPLE VITAMIN) Take one tablet once daily CALCIUM PLUS VITAMIN D 500-50 MG-UNIT  CAPS (CALCIUM CARBONATE-VITAMIN D) Take one tablet twice daily LIPITOR 20 MG  TABS (ATORVASTATIN CALCIUM) Take one tablet at bedtime ANUSOL-HC 25 MG  SUPP (HYDROCORTISONE ACETATE) Take 2 as needed HYDROCODONE-ACETAMINOPHEN 5-500 MG  TABS (HYDROCODONE-ACETAMINOPHEN) Take one tablet every 6-8 hours as needed. ALPRAZOLAM 0.25 MG TABS (ALPRAZOLAM) 1 every 6 hours as needed  Current Allergies: No known allergies   Past Medical History:    Reviewed history from 09/06/2007  and no changes required:       HYPERLIPIDEMIA (ICD-272.4)       HYPERTENSION (ICD-401.9)       ATRIAL FIBRILLATION, PAROXYSMAL (ICD-427.31)          Past Surgical History:    Reviewed history from 09/06/2007 and no changes required:       * CATHETER ABLATION OF ATRIAL FLUTTER       HERNIORRHAPHY, HX OF (ICD-V45.89)       BLADDER SUSPENSION, HX OF (ICD-V45.89)       TOTAL ABDOMINAL HYSTERECTOMY, HX OF (ICD-V45.77)       CHOLECYSTECTOMY, HX OF (ICD-V45.79)       HEMORRHOIDECTOMY, HX OF (ICD-V45.89)     Review of Systems  The patient denies anorexia, fever, weight loss, chest pain, syncope, prolonged cough, abdominal pain, muscle weakness, and difficulty walking.     Physical Exam  General:     heavy set white female NAD Head:     normocephalic and atraumatic.   Neck:     No deformities, masses, or tenderness noted. Lungs:     normal respiratory effort, no intercostal retractions, and no wheezes.   Heart:     normal rate, regular rhythm, and no JVD.   Abdomen:     soft.  obese Msk:     back exam: nl stand; flex to 180 degrees; hindered gait favoring right hip; normal  toe/heel walk; pain with step-up using right leg; SLR sitting is normal; DTR's patellar tendon normal; normal deep vibratory sensation. Skin:     large callus at the plantar left foot at base of 2nd-3rd digit; large callus on great toe.    Impression & Recommendations:  Problem # 1:  SCIATICA, RIGHT (ICD-724.3) Patient with non-radicular exam but a history c/w sciatica. She did have facet arthorpathy in '04.  Plan: L-spine films; APAP 1000 mg three times a day; tramadol 50 mg three times a day as needed. Referral to podiatry for trimming of plantar callus which may be altering her gait and making her pain worse. The following medications were removed from the medication list:    Cyclobenzaprine Hcl 10 Mg Tabs (Cyclobenzaprine hcl) .Marland Kitchen... Take 3 tablets daily as needed.    Diclofenac Sodium 75 Mg Tbec  (Diclofenac sodium) .Marland Kitchen... Take 2 tablets as needed.  Her updated medication list for this problem includes:    Hydrocodone-acetaminophen 5-500 Mg Tabs (Hydrocodone-acetaminophen) .Marland Kitchen... Take one tablet every 6-8 hours as needed.    Tramadol Hcl 50 Mg Tabs (Tramadol hcl) .Marland Kitchen... 1 by mouth 4 times a day as needed  Orders: T-Lumbar Spine Complete, 5 Views ([id number])   Complete Medication List: 1)  Warfarin Sodium 5 Mg Tabs (Warfarin sodium) .... Take 2.5 mg all days except monday and wednesday 5mg . 2)  Metoprolol Tartrate 50 Mg Tabs (Metoprolol tartrate) .... Take 1/2 tablet twice daily 3)  Hydrochlorothiazide 25 Mg Tabs (Hydrochlorothiazide) .... Take one tablet once daily 4)  Multivitamins Tabs (Multiple vitamin) .... Take one tablet once daily 5)  Calcium Plus Vitamin D 500-50 Mg-unit Caps (Calcium carbonate-vitamin d) .... Take one tablet twice daily 6)  Lipitor 20 Mg Tabs (Atorvastatin calcium) .... Take one tablet at bedtime 7)  Anusol-hc 25 Mg Supp (Hydrocortisone acetate) .... Take 2 as needed 8)  Hydrocodone-acetaminophen 5-500 Mg Tabs (Hydrocodone-acetaminophen) .... Take one tablet every 6-8 hours as needed. 9)  Alprazolam 0.25 Mg Tabs (Alprazolam) .Marland Kitchen.. 1 every 6 hours as needed 10)  Tramadol Hcl 50 Mg Tabs (Tramadol hcl) .Marland Kitchen.. 1 by mouth 4 times a day as needed   Patient Instructions: 1)  Please schedule a follow-up appointment as needed. 2)  Sciatica - pain that travels buttock to foot most likely from arthritis in the spine that is causing compression of the sciatic nerve. This is made worse by altered gait caused by left foot callus. Plan: x-rays of the back; see a podiatrist ([facility]); tylenol 500mg  two tablets three times a day (no other source of tylenol please) and tramadol 50mg  four times a day as needed ( a non-narcotic pain medication).   Prescriptions: ALPRAZOLAM 0.25 MG TABS (ALPRAZOLAM) 1 every 6 hours as needed  #30 x 5   Entered and Authorized by:   Jacques Navy MD   Signed by:   Jacques Navy MD on 06/03/2008   Method used:   Print then Give to Patient   RxID:   [id number] TRAMADOL HCL 50 MG  TABS (TRAMADOL HCL) 1 by mouth 4 times a day as needed  #120 x 0   Entered and Authorized by:   Jacques Navy MD   Signed by:   Jacques Navy MD on 06/03/2008   Method used:   Print then Give to Patient   RxID:   [id number] ALPRAZOLAM 0.25 MG TABS (ALPRAZOLAM) 1 every 6 hours as needed  #30 x 5   Entered and Authorized by:  Jacques Navy MD   Signed by:   Jacques Navy MD on 06/03/2008   Method used:   Print then Give to Patient   RxID:   [id number]  ]

## 2010-12-29 NOTE — Letter (Signed)
[facility]: [facility]   Imported By: Lester  04/28/2010 10:04:29  _____________________________________________________________________  External Attachment:    Type:   Image     Comment:   External Document

## 2010-12-29 NOTE — Medication Information (Signed)
Summary: rov/tm  Anticoagulant Therapy  Managed by: Bethena Midget, RN, BSN Referring MD: Sharrell Ku PCP: Jacques Navy MD Supervising MD: Ladona Ridgel MD, Sharlot Gowda Indication 1: Atrial Fibrillation (ICD-427.31) Lab Used: [facility] Site: [facility] INR POC 1.7 INR RANGE 2 - 3  Dietary changes: yes       Details: Slight increase in green leafy veggie intake  Health status changes: no    Bleeding/hemorrhagic complications: no    Recent/future hospitalizations: no    Any changes in medication regimen? no    Recent/future dental: no  Any missed doses?: no       Is patient compliant with meds? yes       Allergies: 1)  ! Percocet  Anticoagulation Management History:      The patient is taking warfarin and comes in today for a routine follow up visit.  Positive risk factors for bleeding include an age of 51 years or older.  The bleeding index is 'intermediate risk'.  Positive CHADS2 values include History of HTN and Age > 48 years old.  The start date was 11/11/2004.  Anticoagulation responsible provider: Ladona Ridgel MD, Sharlot Gowda.  INR POC: 1.7.  Cuvette Lot#: [id number].  Exp: 06/2011.    Anticoagulation Management Assessment/Plan:      The patient's current anticoagulation dose is Warfarin sodium 2.5 mg tabs: Use as directed by Anticoagualtion Clinic.  The target INR is 2.0-3.0.  The next INR is due 04/27/2010.  Anticoagulation instructions were given to patient.  Results were reviewed/authorized by Bethena Midget, RN, BSN.  She was notified by Bethena Midget, RN, BSN.         Prior Anticoagulation Instructions: INR 2.3 Tomorrow take 5mg s. Continue 2.5mg s daily except 5mg s on Wednesdays. Recheck in 4 weeks.   Current Anticoagulation Instructions: INR 1.7 Today 7.5mg s( 3 pills)  then resume 2.5mg s daily except 5mg s on Wednesdays. Recheck in 3 weeks.

## 2010-12-29 NOTE — Medication Information (Signed)
Summary: rov/cb  Anticoagulant Therapy  Managed by: Weston Brass, PharmD Referring MD: Sharrell Ku PCP: Jacques Navy MD Supervising MD: Shirlee Latch MD, Lynell Kussman Indication 1: Atrial Fibrillation (ICD-427.31) Lab Used: [facility] Site: [facility] INR POC 2.1 INR RANGE 2 - 3  Dietary changes: no    Health status changes: no    Bleeding/hemorrhagic complications: yes       Details: had some bruising in her eye, resolved after 4 days  Recent/future hospitalizations: no    Any changes in medication regimen? no    Recent/future dental: no  Any missed doses?: no       Is patient compliant with meds? yes       Allergies: 1)  ! Percocet  Anticoagulation Management History:      The patient is taking warfarin and comes in today for a routine follow up visit.  Positive risk factors for bleeding include an age of 75 years or older.  The bleeding index is 'intermediate risk'.  Positive CHADS2 values include History of HTN and Age > 75 years old.  The start date was 11/11/2004.  Anticoagulation responsible provider: Shirlee Latch MD, Kila Godina.  INR POC: 2.1.  Cuvette Lot#: [id number].  Exp: 08/2011.    Anticoagulation Management Assessment/Plan:      The patient's current anticoagulation dose is Warfarin sodium 2.5 mg tabs: Use as directed by Anticoagualtion Clinic.  The target INR is 2.0-3.0.  The next INR is due 07/13/2010.  Anticoagulation instructions were given to patient.  Results were reviewed/authorized by Weston Brass, PharmD.  She was notified by Dillard Cannon.         Prior Anticoagulation Instructions: INR 2.4. Take 2.5 mg daily except 5 mg on Wednesdays. Recheck in 4 weeks.  Current Anticoagulation Instructions: INR 2.1  Continue same regimen of 1 tab daily except for 2 tabs on Wednesday.  Re-check in 4 weeks.

## 2010-12-29 NOTE — Medication Information (Signed)
Summary: rov/jk  Anticoagulant Therapy  Managed by: Weston Brass, PharmD Referring MD: Sharrell Ku PCP: Jacques Navy MD Supervising MD: Antoine Poche MD, Fayrene Fearing Indication 1: Atrial Fibrillation (ICD-427.31) Lab Used: [facility] Site: [facility] INR POC 1.8 INR RANGE 2 - 3  Dietary changes: yes       Details: Eating more greens the day before yesterday  Health status changes: no    Bleeding/hemorrhagic complications: yes       Details: Bruising on index finger from scrubbing a pot  Recent/future hospitalizations: no    Any changes in medication regimen? no    Recent/future dental: no  Any missed doses?: no       Is patient compliant with meds? yes       Allergies: 1)  ! Percocet  Anticoagulation Management History:      The patient is taking warfarin and comes in today for a routine follow up visit.  Positive risk factors for bleeding include an age of 75 years or older.  The bleeding index is 'intermediate risk'.  Positive CHADS2 values include History of HTN and Age > 62 years old.  The start date was 11/11/2004.  Anticoagulation responsible provider: Antoine Poche MD, Fayrene Fearing.  INR POC: 1.8.  Cuvette Lot#: [id number].  Exp: 09/2011.    Anticoagulation Management Assessment/Plan:      The patient's current anticoagulation dose is Warfarin sodium 2.5 mg tabs: Use as directed by Anticoagualtion Clinic.  The target INR is 2.0-3.0.  The next INR is due 09/07/2010.  Anticoagulation instructions were given to patient.  Results were reviewed/authorized by Weston Brass, PharmD.  She was notified by Kennieth Francois.         Prior Anticoagulation Instructions: INR 2.4  Continue taking 1 tablet (2.5mg ) every day except take 2 tablets (5mg ) on Wednesdays.  Recheck in 4 weeks.   Current Anticoagulation Instructions: INR 1.8  Take two tablets today (9/21) and two tablets tomorrow (9/22), then resume one tablet every day except for two tablets on Wednesday.  Recheck in three weeks.

## 2010-12-29 NOTE — Progress Notes (Signed)
Summary: refill  Medications Added AMIODARONE HCL 200 MG TABS (AMIODARONE HCL) 1 by mouth once daily as directed       Phone Note Refill Request Call back at Home Phone [phone] Call back at [phone] Message from:  Patient  pt needs a Rx [facility] to [facility] [address] [phone] for Amiodarone 200mg  90day supply she takes 1qd by mouth except friday Saturday and Sunday she takes a half a pill  Initial call taken by: Lela Graham,  Mar 30, 2009 1:52 PM    New/Updated Medications: AMIODARONE HCL 200 MG TABS (AMIODARONE HCL) 1 by mouth once daily as directed   Prescriptions: METOPROLOL TARTRATE 50 MG  TABS (METOPROLOL TARTRATE) Take 1/2 tablet twice daily  #90 x 3   Entered by:   Monica McLean   Authorized by:   Lyne Khurana William Iyana Topor, MD, FACC   Signed by:   Monica McLean on 04/01/2009   Method used:   [facility] to        [facility]  [address] [id number]       [address]       [place]       [place], [place]  [address]       Ph: [phone] or [phone]       Fax: [phone]   RxID:   [id number] AMIODARONE HCL 200 MG TABS (AMIODARONE HCL) 1 by mouth once daily as directed  #90 x 3   Entered by:   Monica McLean   Authorized by:   Samariyah Cowles William Kevona Lupinacci, MD, FACC   Signed by:   Monica McLean on 04/01/2009   Method used:   [facility]  [address] [id number]       [address]       [place], [place]  [address]       Ph: [phone] or [phone]       Fax: [phone]   RxID:   [id number]

## 2010-12-29 NOTE — Medication Information (Signed)
Summary: [name] [name]  Anticoagulant Therapy  Managed by: Bethena Midget, RN, BSN Referring MD: Sharrell Ku PCP: Jacques Navy MD Supervising MD: Daleen Squibb MD, Maisie Fus Indication 1: Atrial Fibrillation (ICD-427.31) Lab Used: [facility] Site: [facility] INR POC 2.1 INR RANGE 2 - 3  Dietary changes: no    Health status changes: no    Bleeding/hemorrhagic complications: no    Recent/future hospitalizations: no    Any changes in medication regimen? no    Recent/future dental: no  Any missed doses?: no       Is patient compliant with meds? yes       Allergies: 1)  ! Percocet  Anticoagulation Management History:      The patient is taking warfarin and comes in today for a routine follow up visit.  Positive risk factors for bleeding include an age of 75 years or older.  The bleeding index is 'intermediate risk'.  Positive CHADS2 values include History of HTN and Age > 59 years old.  The start date was 11/11/2004.  Anticoagulation responsible provider: Daleen Squibb MD, Maisie Fus.  INR POC: 2.1.  Cuvette Lot#: [id number].  Exp: 09/2011.    Anticoagulation Management Assessment/Plan:      The patient's current anticoagulation dose is Warfarin sodium 2.5 mg tabs: Use as directed by Anticoagualtion Clinic.  The target INR is 2.0-3.0.  The next INR is due 10/05/2010.  Anticoagulation instructions were given to patient.  Results were reviewed/authorized by Bethena Midget, RN, BSN.  She was notified by Bethena Midget, RN, BSN.         Prior Anticoagulation Instructions: INR 1.8  Take two tablets today (9/21) and two tablets tomorrow (9/22), then resume one tablet every day except for two tablets on Wednesday.  Recheck in three weeks.    Current Anticoagulation Instructions: INR 2.1 Continue 2.5mg s daily except 5mg s on Wednesdays. Recheck in 4 weeks.

## 2010-12-29 NOTE — Medication Information (Signed)
Summary: [name] [name]  Anticoagulant Therapy  Managed by: Cloyde Reams, RN, BSN Referring MD: Sharrell Ku PCP: Jacques Navy MD Supervising MD: Johney Frame MD, Fayrene Fearing Indication 1: Atrial Fibrillation (ICD-427.31) Lab Used: [facility] Site: [facility] INR POC 3.0 INR RANGE 2 - 3  Dietary changes: yes       Details: Diet varied, husband in hospital.  Decr vit K  Health status changes: no    Bleeding/hemorrhagic complications: no    Recent/future hospitalizations: no    Any changes in medication regimen? no    Recent/future dental: no  Any missed doses?: no       Is patient compliant with meds? yes       Allergies (verified): 1)  ! Percocet  Anticoagulation Management History:      The patient is taking warfarin and comes in today for a routine follow up visit.  Positive risk factors for bleeding include an age of 75 years or older.  The bleeding index is 'intermediate risk'.  Positive CHADS2 values include History of HTN and Age > 21 years old.  The start date was 11/11/2004.  Anticoagulation responsible provider: Harlei Lehrmann MD, Fayrene Fearing.  INR POC: 3.0.  Cuvette Lot#: [id number].  Exp: 02/2011.    Anticoagulation Management Assessment/Plan:      The patient's current anticoagulation dose is Warfarin sodium 2.5 mg tabs: Use as directed by Anticoagualtion Clinic.  The target INR is 2.0-3.0.  The next INR is due 02/09/2010.  Anticoagulation instructions were given to patient.  Results were reviewed/authorized by Cloyde Reams, RN, BSN.  She was notified by Cloyde Reams RN.         Prior Anticoagulation Instructions: INR 2.3  Continue same dose of 1 tablet daily except 2 tablets on Wednesdays. Recheck in 4 weeks.  Current Anticoagulation Instructions: INR 3.0  Take 1 tablet today then resume same dosage 1 tablet daily except 2 tablets on Wednesdays.  Recheck in 4 weeks.

## 2010-12-29 NOTE — Letter (Signed)
Summary: Handout Printed  Printed Handout:  - Coumadin Instructions-LARGE FONT 

## 2010-12-29 NOTE — Assessment & Plan Note (Signed)
Summary: BACK PAIN/[facility]   Vital Signs:  Patient Profile:   77 Years Old Female Height:     62 inches Weight:      196 pounds Temp:     98.0 degrees F oral Pulse rate:   60 / minute BP sitting:   148 / 80  (left arm) Cuff size:   regular  Vitals Entered By: Zackery Barefoot CMA (September 15, 2008 4:15 PM)                 PCP:  Jacques Navy MD  Chief Complaint:  Lower back pain and right buttock pain x 2 weeks.  History of Present Illness: Two weeks ago while picking up walnuts she heard a loud crack in her back with sudden pain across back and thighs and abdomen. She was able to walk. She was able to go to reunion. Has pain on the right buttock and thighs and across the small of her back. No paresthesia or muscle weakness in the leg. Hurts at night when she turns over.    Updated Prior Medication List: WARFARIN SODIUM 5 MG  TABS (WARFARIN SODIUM) Take 2.5 mg all days except monday and wednesday 5mg . METOPROLOL TARTRATE 50 MG  TABS (METOPROLOL TARTRATE) Take 1/2 tablet twice daily HYDROCHLOROTHIAZIDE 25 MG  TABS (HYDROCHLOROTHIAZIDE) Take one tablet once daily MULTIVITAMINS   TABS (MULTIPLE VITAMIN) Take one tablet once daily CALCIUM PLUS VITAMIN D 500-50 MG-UNIT  CAPS (CALCIUM CARBONATE-VITAMIN D) Take one tablet twice daily LIPITOR 20 MG  TABS (ATORVASTATIN CALCIUM) Take one tablet at bedtime ANUSOL-HC 25 MG  SUPP (HYDROCORTISONE ACETATE) Take 2 as needed HYDROCODONE-ACETAMINOPHEN 5-500 MG  TABS (HYDROCODONE-ACETAMINOPHEN) Take one tablet every 6-8 hours as needed. ALPRAZOLAM 0.25 MG TABS (ALPRAZOLAM) 1 every 6 hours as needed TRAMADOL HCL 50 MG  TABS (TRAMADOL HCL) 1 by mouth 4 times a day as needed  Current Allergies: No known allergies   Past Medical History:    Reviewed history from 09/06/2007 and no changes required:       HYPERLIPIDEMIA (ICD-272.4)       HYPERTENSION (ICD-401.9)       ATRIAL FIBRILLATION, PAROXYSMAL (ICD-427.31)          Past Surgical  History:    Reviewed history from 09/06/2007 and no changes required:       * CATHETER ABLATION OF ATRIAL FLUTTER       HERNIORRHAPHY, HX OF (ICD-V45.89)       BLADDER SUSPENSION, HX OF (ICD-V45.89)       TOTAL ABDOMINAL HYSTERECTOMY, HX OF (ICD-V45.77)       CHOLECYSTECTOMY, HX OF (ICD-V45.79)       HEMORRHOIDECTOMY, HX OF (ICD-V45.89)     Review of Systems  The patient denies anorexia, fever, weight loss, weight gain, decreased hearing, hoarseness, chest pain, dyspnea on exertion, peripheral edema, headaches, abdominal pain, muscle weakness, transient blindness, and abnormal bleeding.     Physical Exam  General:     overweight white female Head:     Normocephalic and atraumatic without obvious abnormalities. No apparent alopecia or balding. Neck:     No deformities, masses, or tenderness noted. Lungs:     normal respiratory effort.   Heart:     normal rate.   Msk:     back exam: stands with 1 + assistance; flex at waist to 160 degrees; nl toe/heel walk; nl step up left leg, some difficulty right but no assistance required. nl SLR sitting with minor back discomfort right. nl DTRs  patellar tendon, nl sensation light touch. Tender to palpation right paravertebral region at T10-11.    Impression & Recommendations:  Problem # 1:  LUMBAR STRAIN, ACUTE (ICD-847.2) no radiculopathy on exam ruling against HNP. Symptoms not c/w fracture.  Plan: flexeril 10 mg at bedtime and q 6 as needed; aleve 220 2 tabs two times a day; tramadol if needed. No heavy lifitng.  Problem # 2:  ACNE ROSACEA (ICD-695.3) metronidazole gel as needed.  Complete Medication List: 1)  Warfarin Sodium 5 Mg Tabs (Warfarin sodium) .... Take 2.5 mg all days except monday and wednesday 5mg . 2)  Metoprolol Tartrate 50 Mg Tabs (Metoprolol tartrate) .... Take 1/2 tablet twice daily 3)  Hydrochlorothiazide 25 Mg Tabs (Hydrochlorothiazide) .... Take one tablet once daily 4)  Multivitamins Tabs (Multiple vitamin)  .... Take one tablet once daily 5)  Calcium Plus Vitamin D 500-50 Mg-unit Caps (Calcium carbonate-vitamin d) .... Take one tablet twice daily 6)  Lipitor 20 Mg Tabs (Atorvastatin calcium) .... Take one tablet at bedtime 7)  Anusol-hc 25 Mg Supp (Hydrocortisone acetate) .... Take 2 as needed 8)  Hydrocodone-acetaminophen 5-500 Mg Tabs (Hydrocodone-acetaminophen) .... Take one tablet every 6-8 hours as needed. 9)  Alprazolam 0.25 Mg Tabs (Alprazolam) .Marland Kitchen.. 1 every 6 hours as needed 10)  Tramadol Hcl 50 Mg Tabs (Tramadol hcl) .Marland Kitchen.. 1 by mouth 4 times a day as needed 11)  Cyclobenzaprine Hcl 10 Mg Tabs (Cyclobenzaprine hcl) .... 1/2 or 1 at bedtime and q 6 as needed back spasm. 12)  Qc Naproxen Sodium 220 Mg Tabs (Naproxen sodium) .... 2 by mouth two times a day.   Patient Instructions: 1)  low back strain. NO evidence of herniated disk or fracture. Plan - aleve (generic naproxen sodium) 2 tabs AM and PM; cyclobenzaprine 10mg  at bedtime and every 6 hours as needed during the day for muscle spasm. OK to use tramadol if you need to for pain that is not relieved. No heavy lifting until better.   Prescriptions: CYCLOBENZAPRINE HCL 10 MG TABS (CYCLOBENZAPRINE HCL) 1/2 or 1 at bedtime and q 6 as needed back spasm.  #30 x 2   Entered and Authorized by:   Jacques Navy MD   Signed by:   Jacques Navy MD on 09/15/2008   Method used:   Electronically to        [facility] [address] (retail)       [address]       [place], [place]  [address]       Ph: [phone]       Fax: [phone]   RxID:   [id number]  ]

## 2010-12-29 NOTE — Medication Information (Signed)
Summary: rov/sp  Anticoagulant Therapy  Managed by: Weston Brass, PharmD Referring MD: Sharrell Ku PCP: Jacques Navy MD Supervising MD: Antoine Poche MD, Fayrene Fearing Indication 1: Atrial Fibrillation (ICD-427.31) Lab Used: [facility] Site: [facility] INR POC 2.4 INR RANGE 2 - 3  Dietary changes: yes       Details: Ate less greens than normal while on vacation last week.   Health status changes: no    Bleeding/hemorrhagic complications: no    Recent/future hospitalizations: no    Any changes in medication regimen? no    Recent/future dental: no  Any missed doses?: no       Is patient compliant with meds? yes       Allergies: 1)  ! Percocet  Anticoagulation Management History:      Positive risk factors for bleeding include an age of 75 years or older.  The bleeding index is 'intermediate risk'.  Positive CHADS2 values include History of HTN and Age > 41 years old.  The start date was 11/11/2004.  Anticoagulation responsible provider: Antoine Poche MD, Fayrene Fearing.  INR POC: 2.4.  Exp: 08/2011.    Anticoagulation Management Assessment/Plan:      The patient's current anticoagulation dose is Warfarin sodium 2.5 mg tabs: Use as directed by Anticoagualtion Clinic.  The target INR is 2.0-3.0.  The next INR is due 08/15/2010.  Anticoagulation instructions were given to patient.  Results were reviewed/authorized by Weston Brass, PharmD.         Prior Anticoagulation Instructions: INR 2.1  Continue same regimen of 1 tab daily except for 2 tabs on Wednesday.  Re-check in 4 weeks.  Current Anticoagulation Instructions: INR 2.4  Continue taking 1 tablet (2.5mg ) every day except take 2 tablets (5mg ) on Wednesdays.  Recheck in 4 weeks.

## 2010-12-29 NOTE — Letter (Signed)
\   [facility] [address] [place], [place]  [address] Phone: [phone]      June 06, 2008   [facility] [address] [place], [place] [address]  RE:  LAB RESULTS  Dear  Ms. Gaffey,  The following is an interpretation of your most recent lab tests.  Please take note of any instructions provided or changes to medications that have resulted from your lab work.    Lumbar spine films do not suggest degenerative disk disease. However, there is evidence of arthritic change.   Your "sciatica" is probably due to arthritic change. No surgical intervention is necessary. Continue with the treatment we discussed at your visit.  Call or e-mail me if you have questions ([email or web address]).   Sincerely Yours,    Jacques Navy MD  Appended Document:  MAILED COPY TO PT PER MD

## 2010-12-29 NOTE — Progress Notes (Signed)
Summary: MOLE  Phone Note Call from Patient   Caller: [phone] [name] [name] Summary of Call: At last office visit pt had a mole on her back that Dr Debby Bud was concerned about. Pt's daughter took a picture of this and showed it to her dematologist. They told daughter that she should be seen urgently. Daughter tried to get pt apt with Dr Karlyn Agee but they will not see pt w/o referral. Daughter is very anxious for pt to be seen immediately by a dermatologist. She will email a picture of this mole to Dr if you would like.  Initial call taken by: Lamar Sprinkles,  October 06, 2008 1:26 PM  Follow-up for Phone Call        This needs surgical excision: we can either refer to a dermatologist or to a general surgeon. Ask patient her preference and we can then facilitate an urgent consult. Follow-up by: Jacques Navy MD,  October 07, 2008 7:37 AM  Additional Follow-up for Phone Call Additional follow up Details #1::        Pt says she wants an apt with Dr Karlyn Agee or Dr Londell Moh w/[facility]. When pt called she was told she could not be scheduled until 12/30, but still needs a referral. Per daughter, area is inflammed.  Additional Follow-up by: Lamar Sprinkles,  October 07, 2008 9:19 AM    Additional Follow-up for Phone Call Additional follow up Details #2::    I called [facility] and they will work her in ASAP. They will call [name] [name] with an appointment time. Follow-up by: Jacques Navy MD,  October 07, 2008 10:20 AM  Additional Follow-up for Phone Call Additional follow up Details #3:: Details for Additional Follow-up Action Taken: Pt's insurance requires referral, please put in asap, ALSO do you know who pt will be seeing? If yes please put in w/referral order.  Done. [name] Additional Follow-up by: Lamar Sprinkles,  October 07, 2008 1:46 PM

## 2010-12-29 NOTE — Assessment & Plan Note (Signed)
    PCP:  Jacques Navy MD   History of Present Illness: patient presents with husband. She has had a two week history of cough which did produce a green sputum, she had low grade fever. She continues to cough. No fever for 48 hours. Still with cough but non-productive.    Current Allergies: No known allergies       Physical Exam  General:     alert, well-developed, well-nourished, well-hydrated, and normal appearance.   Neck:     No deformities, masses, or tenderness noted. Lungs:     normal respiratory effort, no intercostal retractions, no accessory muscle use, and normal breath sounds.  Wheeze at right base. Heart:     normal rate and regular rhythm.      Impression & Recommendations:  Problem # 1:  BRONCHITIS-ACUTE (ICD-466.0) persistent cough, non-productive, no fever at this time.  Plan: doxycycline 100mg , promethazine/cpod 1 tsp q 6 as needed. Mucinex. Her updated medication list for this problem includes:    Doxycycline Hyclate 100 Mg Caps (Doxycycline hyclate) .Marland Kitchen... 1 by mouth two times a day x 7   Complete Medication List: 1)  Warfarin Sodium 5 Mg Tabs (Warfarin sodium) .... Take 2.5 mg all days except monday and wednesday 5mg . 2)  Amiodarone Hcl 200 Mg Tabs (Amiodarone hcl) .... Take one whole tablet m-f 1/2 tablet sat and sun. 3)  Metoprolol Tartrate 50 Mg Tabs (Metoprolol tartrate) .... Take 1/2 tablet twice daily 4)  Hydrochlorothiazide 25 Mg Tabs (Hydrochlorothiazide) .... Take one tablet once daily 5)  Multivitamins Tabs (Multiple vitamin) .... Take one tablet once daily 6)  Calcium Plus Vitamin D 500-50 Mg-unit Caps (Calcium carbonate-vitamin d) .... Take one tablet twice daily 7)  Lipitor 20 Mg Tabs (Atorvastatin calcium) .... Take one tablet at bedtime 8)  Anusol-hc 25 Mg Supp (Hydrocortisone acetate) .... Take 2 as needed 9)  Hydrocodone-acetaminophen 5-500 Mg Tabs (Hydrocodone-acetaminophen) .... Take one tablet every 6-8 hours as needed. 10)   Cyclobenzaprine Hcl 10 Mg Tabs (Cyclobenzaprine hcl) .... Take 3 tablets daily as needed. 11)  Diclofenac Sodium 75 Mg Tbec (Diclofenac sodium) .... Take 2 tablets as needed. 12)  Zocor 20 Mg Tabs (Simvastatin) 13)  Alprazolam 0.25 Mg Tabs (Alprazolam) .Marland Kitchen.. 1 every 6 hours as needed 14)  Doxycycline Hyclate 100 Mg Caps (Doxycycline hyclate) .Marland Kitchen.. 1 by mouth two times a day x 7     Prescriptions: DOXYCYCLINE HYCLATE 100 MG  CAPS (DOXYCYCLINE HYCLATE) 1 by mouth two times a day x 7  #14 x 0   Entered and Authorized by:   Jacques Navy MD   Signed by:   Jacques Navy MD on 01/21/2008   Method used:   Print then Give to Patient   RxID:   [id number]  ]

## 2010-12-29 NOTE — Medication Information (Signed)
Summary: rov/sl  Anticoagulant Therapy  Managed by: Weston Brass, PharmD Referring MD: Sharrell Ku PCP: Jacques Navy MD Supervising MD: Ladona Ridgel MD, Sharlot Gowda Indication 1: Atrial Fibrillation (ICD-427.31) Lab Used: [facility] Site: [facility] INR POC 2.1 INR RANGE 2 - 3  Dietary changes: yes       Details: decreased greens slightly  Health status changes: no    Bleeding/hemorrhagic complications: no    Recent/future hospitalizations: no    Any changes in medication regimen? no    Recent/future dental: no  Any missed doses?: no       Is patient compliant with meds? yes       Allergies: 1)  ! Percocet  Anticoagulation Management History:      The patient is taking warfarin and comes in today for a routine follow up visit.  Positive risk factors for bleeding include an age of 75 years or older.  The bleeding index is 'intermediate risk'.  Positive CHADS2 values include History of HTN and Age > 13 years old.  The start date was 11/11/2004.  Anticoagulation responsible provider: Ladona Ridgel MD, Sharlot Gowda.  INR POC: 2.1.  Cuvette Lot#: [id number].  Exp: 08/2011.    Anticoagulation Management Assessment/Plan:      The patient's current anticoagulation dose is Warfarin sodium 2.5 mg tabs: Use as directed by Anticoagualtion Clinic.  The target INR is 2.0-3.0.  The next INR is due 11/30/2010.  Anticoagulation instructions were given to patient.  Results were reviewed/authorized by Weston Brass, PharmD.  She was notified by Weston Brass PharmD.         Prior Anticoagulation Instructions: INR 2.4 Continue 2.5mg s daily except 5mg s on Wednesdays. Recheck in 4 weeks.   Current Anticoagulation Instructions: INR 2.1  Continue same dose of 1 tablet every day except 2 tablets on Wednesday.  Recheck INR in 4 weeks.

## 2010-12-29 NOTE — Miscellaneous (Signed)
Summary: Eval & Tx plan/[facility]  Eval & Tx plan/[facility]   Imported By: Lester Carrizo 06/30/2009 10:36:27  _____________________________________________________________________  External Attachment:    Type:   Image     Comment:   External Document

## 2011-01-04 ENCOUNTER — Encounter: Payer: Self-pay | Admitting: Internal Medicine

## 2011-01-04 ENCOUNTER — Encounter (INDEPENDENT_AMBULATORY_CARE_PROVIDER_SITE_OTHER): Payer: Medicare Other

## 2011-01-04 DIAGNOSIS — I4891 Unspecified atrial fibrillation: Secondary | ICD-10-CM

## 2011-01-04 DIAGNOSIS — Z7901 Long term (current) use of anticoagulants: Secondary | ICD-10-CM

## 2011-01-04 LAB — CONVERTED CEMR LAB: POC INR: 2.3

## 2011-01-12 NOTE — Medication Information (Signed)
Summary: Coumadin Clinic  Anticoagulant Therapy  Managed by: Weston Brass, PharmD Referring MD: Sharrell Ku PCP: Jacques Navy MD Supervising MD: Graciela Husbands MD, Viviann Spare Indication 1: Atrial Fibrillation (ICD-427.31) Lab Used: [facility] Site: [facility] INR POC 2.3 INR RANGE 2 - 3  Dietary changes: yes       Details: Ate less greens this week due to missed dose  Health status changes: no    Bleeding/hemorrhagic complications: no    Recent/future hospitalizations: no    Any changes in medication regimen? no    Recent/future dental: no  Any missed doses?: yes     Details: Missed at least 1 dose last week either on Tuesday or Wednesday because family member passed away.    Is patient compliant with meds? yes       Allergies: 1)  ! Percocet  Anticoagulation Management History:      The patient is taking warfarin and comes in today for a routine follow up visit.  Positive risk factors for bleeding include an age of 75 years or older.  The bleeding index is 'intermediate risk'.  Positive CHADS2 values include History of HTN and Age > 36 years old.  The start date was 11/11/2004.  Anticoagulation responsible provider: Graciela Husbands MD, Viviann Spare.  INR POC: 2.3.  Cuvette Lot#: [id number].  Exp: 11/2011.    Anticoagulation Management Assessment/Plan:      The patient's current anticoagulation dose is Warfarin sodium 2.5 mg tabs: Use as directed by Anticoagualtion Clinic.  The target INR is 2.0-3.0.  The next INR is due 02/02/2011.  Anticoagulation instructions were given to patient.  Results were reviewed/authorized by Weston Brass, PharmD.  She was notified by Margot Chimes PharmD Candidate.         Prior Anticoagulation Instructions: INR 1.8  Coumadin 2.5 mg tablets -  Take 2 tablets today and 1.5 tablets tomorrow, then resume 1 tablet every day except 2 tablets on Wednesdays  Current Anticoagulation Instructions: INR 2.3  Continue current coumadin dose of 1 tablet everyday except 2  tablets on Wednesdays.  Recheck INR in 4 weeks.

## 2011-01-31 ENCOUNTER — Encounter: Payer: Self-pay | Admitting: Internal Medicine

## 2011-01-31 DIAGNOSIS — I4891 Unspecified atrial fibrillation: Secondary | ICD-10-CM

## 2011-01-31 DIAGNOSIS — I4892 Unspecified atrial flutter: Secondary | ICD-10-CM

## 2011-02-02 ENCOUNTER — Encounter (INDEPENDENT_AMBULATORY_CARE_PROVIDER_SITE_OTHER): Payer: Medicare Other

## 2011-02-02 ENCOUNTER — Encounter: Payer: Self-pay | Admitting: Internal Medicine

## 2011-02-02 DIAGNOSIS — Z7901 Long term (current) use of anticoagulants: Secondary | ICD-10-CM

## 2011-02-02 DIAGNOSIS — I4891 Unspecified atrial fibrillation: Secondary | ICD-10-CM

## 2011-02-02 LAB — CONVERTED CEMR LAB: POC INR: 2.4

## 2011-02-07 NOTE — Medication Information (Signed)
Summary: [name] [name]  Anticoagulant Therapy  Managed by: Cloyde Reams, RN, BSN Referring MD: Sharrell Ku PCP: Jacques Navy MD Supervising MD: Ladona Ridgel MD, Sharlot Gowda Indication 1: Atrial Fibrillation (ICD-427.31) Lab Used: [facility] Site: [facility] INR POC 2.4 INR RANGE 2 - 3  Dietary changes: no    Health status changes: no    Bleeding/hemorrhagic complications: no    Recent/future hospitalizations: no    Any changes in medication regimen? no    Recent/future dental: no  Any missed doses?: yes     Details: Missed 2 doses with in the last week.    Is patient compliant with meds? yes       Allergies: 1)  ! Percocet  Anticoagulation Management History:      The patient is taking warfarin and comes in today for a routine follow up visit.  Positive risk factors for bleeding include an age of 75 years or older.  The bleeding index is 'intermediate risk'.  Positive CHADS2 values include History of HTN and Age > 4 years old.  The start date was 11/11/2004.  Anticoagulation responsible provider: Ladona Ridgel MD, Sharlot Gowda.  INR POC: 2.4.  Cuvette Lot#: [id number].  Exp: 01/2012.    Anticoagulation Management Assessment/Plan:      The patient's current anticoagulation dose is Warfarin sodium 2.5 mg tabs: Use as directed by Anticoagualtion Clinic.  The target INR is 2.0-3.0.  The next INR is due 03/02/2011.  Anticoagulation instructions were given to patient.  Results were reviewed/authorized by Cloyde Reams, RN, BSN.  She was notified by Cloyde Reams RN.         Prior Anticoagulation Instructions: INR 2.3  Continue current coumadin dose of 1 tablet everyday except 2 tablets on Wednesdays.  Recheck INR in 4 weeks.  Current Anticoagulation Instructions: INR 2.4  Continue on same dosage 1 tablet daily except 2 tablets on Wednesdays.  Recheck in 4 weeks.

## 2011-03-02 ENCOUNTER — Ambulatory Visit (INDEPENDENT_AMBULATORY_CARE_PROVIDER_SITE_OTHER): Payer: Medicare Other | Admitting: *Deleted

## 2011-03-02 DIAGNOSIS — I4892 Unspecified atrial flutter: Secondary | ICD-10-CM

## 2011-03-02 DIAGNOSIS — Z7901 Long term (current) use of anticoagulants: Secondary | ICD-10-CM

## 2011-03-02 DIAGNOSIS — I4891 Unspecified atrial fibrillation: Secondary | ICD-10-CM

## 2011-03-02 LAB — POCT INR: INR: 1.9

## 2011-03-30 ENCOUNTER — Ambulatory Visit (INDEPENDENT_AMBULATORY_CARE_PROVIDER_SITE_OTHER): Payer: Medicare Other | Admitting: *Deleted

## 2011-03-30 DIAGNOSIS — I4892 Unspecified atrial flutter: Secondary | ICD-10-CM

## 2011-03-30 DIAGNOSIS — I4891 Unspecified atrial fibrillation: Secondary | ICD-10-CM

## 2011-03-30 LAB — POCT INR: INR: 2.6

## 2011-03-31 ENCOUNTER — Other Ambulatory Visit: Payer: Self-pay | Admitting: Internal Medicine

## 2011-04-11 NOTE — Assessment & Plan Note (Signed)
[facility]                         ELECTROPHYSIOLOGY OFFICE NOTE   NAME:Furgason, VENISA FRAMPTON                       MRN:          [id number]  DATE:08/26/2008                            DOB:          Dec 09, 1930    Ms. Tersigni returns today for followup.  She is a very pleasant woman  with paroxysmal atrial fibrillation, dyslipidemia, and hypertension.  She has done quite nicely on amiodarone therapy and returns today for  followup.  She denies chest pain.  She denies shortness of breath today.   CURRENT MEDICATIONS:  1. Warfarin as directed.  2. Metoprolol 50 mg half tablet twice a day.  3. HCTZ 25 a day.  4. Lipitor 20 a day.  5. Amiodarone 200 mg on Friday, Saturday, and Sunday and 100 mg Monday      through Thursday.  6. She is also on tramadol p.r.n.   PHYSICAL EXAMINATION:  GENERAL:  She is a pleasant well-appearing woman  in no distress.  VITAL SIGNS:  Blood pressure was 142/70, the pulse 68 and regular,  respirations were 18, and the weight was 198 pounds.  NECK:  No jugular distention.  LUNGS:  Clear bilaterally to auscultation.  No wheezes, rales, or  rhonchi are present.  CARDIOVASCULAR:  Regular rhythm.  Normal S1 and S2.  ABDOMEN:  Soft and nontender.  EXTREMITIES:  No cyanosis, clubbing, or edema.   EKG demonstrates sinus rhythm with borderline prolongation of the QT.   IMPRESSION:  1. Paroxysmal atrial fibrillation.  2. Hypertension.  3. Dyslipidemia.  4. Amiodarone therapy secondary to paroxysmal atrial fibrillation.   DISCUSSION:  Ms. Abadi AFib has been well controlled.  Her QT is  mildly prolonged, but I think at present she is doing quite well.  I  have asked that she continue her amiodarone dosing as she currently has  been taking it, and I will plan to see her back in several months.     Doylene Canning. Ladona Ridgel, MD  Electronically Signed    GWT/MedQ  DD: 08/26/2008  DT: 08/27/2008  Job #: [id number]

## 2011-04-11 NOTE — Assessment & Plan Note (Signed)
[facility]                         ELECTROPHYSIOLOGY OFFICE NOTE   NAME:[name] [name] JAMISON                       MRN:          [id number]  DATE:02/05/2008                            DOB:          1931/09/19    HISTORY OF PRESENT ILLNESS:  [name] [name] returns in follow-up.  She is a  very pleasant elderly woman with a history of paroxysmal atrial  fibrillation who has maintained sinus rhythm very nicely.  She returns  for follow-up.  She denies chest pain or shortness of breath.  Otherwise, she had no specific complaints today.  She notes that she has  been under a lot of stress lately because her son has  been ill and  lives in [place], [place].   PHYSICAL EXAMINATION:  GENERAL:  She is a pleasant, well-appearing,  obese woman in no distress.  Blood pressure 154/80, the pulse 53 and  regular, respirations 18, weight 102 pounds.  NECK:  Revealed no jugular distention.  LUNGS:  Clear bilaterally auscultation.  No wheezes, rales or rhonchi  are present.  CARDIOVASCULAR:  Regular rate and rhythm.  Normal S1-S2.  EXTREMITIES:  Demonstrate no cyanosis, clubbing or edema.   STUDIES:  EKG demonstrates sinus rhythm with QT of 490, corrected.   IMPRESSION:  1. Paroxysmal atrial fibrillation.  2. Hypertension.  3. Chronic Coumadin therapy.  4. Chronic amiodarone therapy.   DISCUSSION:  [name] [name] is maintaining sinus rhythm very nicely.  I have  recommended that she continue her amiodarone at alternating 100 mg and  200 mg doses every other day.  I plan to see her back in the office in 6  months.     Doylene Canning. Ladona Ridgel, MD  Electronically Signed    GWT/MedQ  DD: 02/05/2008  DT: 02/06/2008  Job #: [id number]

## 2011-04-11 NOTE — Assessment & Plan Note (Signed)
[facility]                         ELECTROPHYSIOLOGY OFFICE NOTE   NAME:Demedeiros, JONELLE BANN                       MRN:          [id number]  DATE:07/30/2007                            DOB:          May 23, 1931    Ms. Sestak returns today for follow-up.  She is a very pleasant 77-year-  old woman with a history of paroxysmal atrial fibrillation, history of  atrial flutter status post ablation, history of amiodarone therapy,  history of chronic Coumadin therapy, and a history of hypertension, who  returns today for follow-up.  Overall she has done well.  She has rare  palpitations lasting seconds to up to a minute at a time.  Otherwise she  has been stable.   PHYSICAL EXAMINATION:  She is a well-appearing woman in no acute  distress.  Blood pressure 126/82 by my check, pulse was 56 and regular, the weight  was 191 pounds.  NECK:  No jugular venous distention.  LUNGS:  Clear bilaterally to auscultation.  No wheezes, rales or rhonchi  were present.  CARDIOVASCULAR:  A regular rate and rhythm with a normal S1 and S2.  There were no murmurs, rubs or gallops.  EXTREMITIES:  No edema.   EKG demonstrates sinus bradycardia with normal axis and intervals.   MEDICATIONS:  1. Warfarin as directed.  2. Amiodarone 200 mg alternating with 100 mg every other day.  3. Metoprolol 25 mg twice daily.  4. Hydrochlorothiazide 25 mg a day.  5. Multiple vitamins.  6. Lipitor 20 mg q.h.s.   IMPRESSION:  1. Paroxysmal atrial fibrillation.  2. Hypertension.  3. History of atrial flutter, status post ablation.  4. Chronic Coumadin therapy.   DISCUSSION:  Overall, Ms. Gilkey is stable and she is maintaining a  sinus rhythm very nicely.  I will plan to see her back in the office in  6-12 months.     Doylene Canning. Ladona Ridgel, MD  Electronically Signed    GWT/MedQ  DD: 07/30/2007  DT: 07/31/2007  Job #: [id number]

## 2011-04-14 NOTE — Op Note (Signed)
[name] [name], [name] [name]                ACCOUNT NO.:  [id number]   MEDICAL RECORD NO.:  [id number]          PATIENT TYPE:  OIB   LOCATION:  2899                         FACILITY:  [facility]   PHYSICIAN:  Doylene Canning. Ladona Ridgel, M.D.  DATE OF BIRTH:  10-May-1931   DATE OF PROCEDURE:  07/21/2005  DATE OF DISCHARGE:                                 OPERATIVE REPORT   PROCEDURE PERFORMED:  Electrophysiologic study and arc catheter ablation of  atrial flutter.   I INSTRUCTIONS:  The patient is a 77 year old woman with a history of atrial  fibrillation who subsequently developed atrial flutter on amiodarone. She  had periods of rapid ventricular response. The patient developed congestive  heart failure. She has had intermittent sinus rhythm but has continued to  have symptomatic atrial flutter as well as some episodes of atrial  fibrillation is now referred for electrophysiologic study and catheter  ablation.   II PROCEDURE:  After informed consent was obtained, the patient was taken  tot he diagnostic DP lab in the fasting state. After the usual preparation  and draping, intravenous fentanyl __________ was given for sedation. A 6-  French flexible catheter was inserted percutaneously into the right jugular  vein and advanced to the coronary sinus. A 7-French 20 pole halo catheter  was inserted percutaneously into the right femoral vein and advanced to the  advanced to the right atrium. A 5-French quadripolar catheter was inserted  percutaneously in the right femoral vein and advanced to the His bundle  region. Mapping was carried out which demonstrated episodes of atrial  flutter, followed by a episodes of atrial fibrillation. The patient would  spontaneously go from one to the other. In addition, the flutter appeared to  be typical as well as reversed typical. A left atrial component also not be  excluded.   Ibutilide was subsequently infused with 1 mg delivered over 10 minutes,  after DC  cardioversion and returned sinus rhythm, but resulted in immediate  return to atrial fibrillation. The 7-French quadripolar ablation catheter  was then inserted by way of the right femoral vein and advanced to the right  atrium at the time that ibutilide infusion was initiated. Immediately when  one RF energy application was initiated, the patient's fibrillation resolved  and the patient had continuous atrial flutter. During the third RF energy  application atrial flutter was terminated and immediately returned. During  the fourth RF energy application, typical atrial flutter was terminated as  well. Following this four additional RF energy applications were delivered  resulting in an isthmus block. Nine bonus RF energy applications were then  delivered, and the patient was observed for 30 minutes. During this time,  rapid ventricular pacing was carried out demonstrating VA dissociation.  Programmed ventricular stimulation was carried out demonstrating VA  dissociation. Rapid atrial pacing was carried out demonstrating AV  Wenckebach at a cycle length of 480 milliseconds.   Programmed H stimulation was then carried out based on a cycle length of 600  milliseconds and stepwise decreased down to 410 milliseconds where AV node  ERP was observed. There were no  inducible arrhythmias. The patient was  observed for 30 more minutes and had no isthmus conduction; catheters were  removed; hemostasis assured; and the patient returned to her room in  satisfactory condition in sinus rhythm.   III COMPLICATIONS:  There were no immediate procedure complications.   IV RESULTS.   IV A. BASELINE:  ECG demonstrates atypical atrial flutter for.   IV B. BASELINE INTERVALS:  The HV interval was 50 milliseconds at baseline  with a cycle length of approximately 270 milliseconds.   IV C. RAPID ATRIAL PACING:  Rapid atrial pacing demonstrated VA  dissociation.   IV D. PROGRAMMED VENTRICULAR STIMULATION:   Programmed ventricular  stimulation demonstrated VA dissociation.   IV E. RAPID ATRIAL PACING:  Rapid atrial pacing demonstrated an AV  Wenckebach cycle length of 480 milliseconds.   IV F. PROGRAMMED ATRIAL STIMULATION:  Programmed atrial stimulation was  carried out at base drive cycle length of 161 milliseconds stepwise  decreased down to 410 milliseconds where an AV node ERP was observed. During  atrial pacing and programmed atrial stimulation there was no inducible  arrhythmia.   IV G. ARRHYTHMIAS OBSERVED:  1.  Atrial flutter.  Initiation present at the time of EP study. Duration      sustained, termination with catheter ablation.  2.  Atrial fibrillation. Initiation present at time of EP study. Duration      sustained, termination was with ibutilide.   IV H. AH MAPPING:  Mapping demonstrated typical atrial flutter isthmus  anatomy.   IV I RF ENERGY APPLICATION:  A total of 17 RF energy applications were  delivered to the atrial flutter isthmus resulting in the creation of an  atrial flutter bidirectional isthmus block.   V CONCLUSIONS:  Study demonstrates successful electrophysiologic study with  RF catheter ablation of both typical of atypical atrial flutter. The patient  will be maintained on amiodarone for her atrial fibrillation.           ______________________________  Doylene Canning. Ladona Ridgel, M.D.     GWT/MEDQ  D:  07/21/2005  T:  07/22/2005  Job:  [id number]   cc:   Rosalyn Gess. Norins, M.D. [facility]  [address]  [facility] [facility]  [place] [address]

## 2011-04-14 NOTE — Discharge Summary (Signed)
[name], [name]                ACCOUNT NO.:  [id number]   MEDICAL RECORD NO.:  [id number]          PATIENT TYPE:  OIB   LOCATION:  6531                         FACILITY:  [facility]   PHYSICIAN:  Doylene Canning. Ladona Ridgel, M.D.  DATE OF BIRTH:  1931/01/16   DATE OF ADMISSION:  07/21/2005  DATE OF DISCHARGE:  07/22/2005                                 DISCHARGE SUMMARY   DISCHARGE DIAGNOSES:  1.  Discharging day #1 status post electrophysiology study/radiofrequency      catheter ablation of typical and atypical atrial flutter with creation      of bidirectional block.  2.  Stye in right eye, treated with warm compresses and also with antibiotic      drops.  3.  History of paroxysmal atrial fibrillation with starting amiodarone at      office visit in June of 2006 and converting patient's rhythm.  4.  Persistent atrial flutter with dyspnea on exertion.   SECONDARY DIAGNOSES:  1.  Hypertension.  2.  Congestive heart failure.   PROCEDURE:  1.  On July 21, 2005, electrophysiology study/radiofrequency catheter      ablation of both typical and atypical atrial flutter with creation of      bidirectional block by Dr. Lewayne Bunting.  The patient has had no post-      procedural complications and has maintained sinus rhythm.  2.  The patient also mentioned post-procedurally that she has noted over the      last 24 hours, development of tenderness and swelling underneath the      right eye.  This is pronounced and will be treated with warm compresses      and antibiotic solution.  The conjunctiva of the eye itself is not      involved.  It is clear.  There is mild erythema and swelling in the      suborbital area which is somewhat tender.   DISPOSITION:  The patient discharging on day #1.  She will continue her home  medications which include Coumadin and amiodarone.   DISCHARGE INSTRUCTIONS:  She may shower.  She is to call [phone] if she  experiences pain and swelling at the catheterization  site.  She is asked not  to drive for the next two days and not to lift any weights for the next two  weeks.   DISCHARGE DIET:  Low sodium, low cholesterol diet.   DISCHARGE MEDICATIONS:  1.  Zocor 20 mg daily at bedtime.  2.  Hydrochlorothiazide 25 mg daily.  3.  Lopressor 25 mg twice daily.  4.  Amiodarone 200 mg twice daily.  5.  Coumadin 5 mg, one tablet on Monday, one-half tablet on all other days.  6.  TobraDex 0.3% ophthalmic solution, one drop in the right eye every four      hours.  Also, warm compresses to the right eye as needed.   FOLLOW UP:  The patient will present to the Coumadin clinic on Friday,  July 28, 2005 at [facility], and she will see Dr. Ladona Ridgel in  followup on Thursday, August 17, 2005 at 10:45 a.m.   HISTORY OF PRESENT ILLNESS:  [name] is a 77 year old female.  She  was referred by Dr. Illene Regulus for evaluation of paroxysmal atrial  fibrillation/atrial flutter.  Her atrial dysrhythmias were discovered quite  by accident when she had an electrocardiogram just prior to undergoing  hemorrhoidectomy.  She was actually completely prepped for the procedure and  ready to enter the operating room when this was discovered.  The patient had  been put on amiodarone when she was seen by Dr. Lewayne Bunting in June of this  year, and her atrial fibrillation has resolved.  She does maintain in atrial  flutter.  She is relatively asymptomatic.  She has dyspnea on exertion but  otherwise denies chest pain and peripheral edema.  The patient now presents  for radiofrequency catheter ablation of atrial flutter since it has been  present despite amiodarone therapy.  The patient will be admitted electively  July 21, 2005.   HOSPITAL COURSE:  The patient was admitted electively on July 21, 2005.  She was found on electrophysiology study to have both typical atypical  atrial flutter patterns.  She underwent successful ablation with ibutilide   infusion, with termination of atrial flutter and restoration of normal sinus  rhythm.  Bidirectional block was created.  The patient has maintained sinus  rhythm after electrophysiology study and ablation.  She has been treated for  suborbital inflammation, most probably a stye.  She is to report to her  primary caregiver if this does not resolve with hot compresses and  antibiotic drops.  Once again, she is to follow up with Dr. Ladona Ridgel on  Thursday, August 17, 2005.      Maple Mirza, P.A.    ______________________________  Doylene Canning. Ladona Ridgel, M.D.    GM/MEDQ  D:  07/21/2005  T:  07/23/2005  Job:  [id number]   cc:   Rosalyn Gess. Norins, M.D. [facility]  [address]  [place]  [place] [address]   Doylene Canning. Ladona Ridgel, M.D.  [address]  [address]  [place]  [place] [address]

## 2011-04-14 NOTE — Assessment & Plan Note (Signed)
[facility]                         ELECTROPHYSIOLOGY OFFICE NOTE   NAME:[name], [name]                       MRN:          [id number]  DATE:02/11/2007                            DOB:          01-15-1931    [name] returns today for followup.  She is a very pleasant elderly  woman with a history of paroxysmal atrial fibrillation, history of  obesity, history of hypertension, and chronic Coumadin therapy who  returns today for followup.  Overall, she has been stable.  She has had  no particular palpitations or history of recurrence of atrial  fibrillation.  She notes that she was recently out [place] and actually  walked four flights up to a [facility].  This resulted in severe fatigue  and weakness but no arrhythmias and no other symptoms.  Since then she  has learned not to walk up that many flights of stairs.  Her exam is  notable for a pleasant elderly woman in no distress.  Blood pressure is  142/80, the pulse 65 and regular, respirations were 18, weight was 189  pounds, unchanged from her visit back in September 2007.  The neck  revealed no jugular venous distention.  Lungs were clear bilaterally to  auscultation.  There were no wheezes, rales, or rhonchi.  The  cardiovascular exam revealed a regular rate and rhythm with normal S1  and S2.  The extremities demonstrated no cyanosis, clubbing or edema.  The medications include:  1. Warfarin.  2. Amiodarone 200 mg Monday through Friday and 100 mg Saturday and      Sunday.  3. Metoprolol 25 mg twice a day.  4. Hydrochlorothiazide.  5. Lipitor 20 mg daily.   IMPRESSION:  1. Paroxysmal atrial fibrillation.  2. Amiodarone therapy.  3. Chronic Coumadin therapy.   DISCUSSION:  Overall, [name] is stable and she is maintaining sinus  rhythm very nicely on amiodarone.  We will see her back in the office in  several months.     Doylene Canning. Ladona Ridgel, MD  Electronically Signed    GWT/MedQ  DD:  02/11/2007  DT: 02/12/2007  Job #: [id number]

## 2011-04-14 NOTE — Assessment & Plan Note (Signed)
[facility]                           ELECTROPHYSIOLOGY OFFICE NOTE   NAME:[name] [name] [name], [name] [name]                       MRN:          [id number]  DATE:08/14/2006                            DOB:          [birth year]    [name] [name] [name] returns today for followup.  She is a very pleasant 77 year old  woman with a history of atrial flutter, status post ablation, history of  atrial fibrillation, chronic Coumadin therapy, chronic amiodarone therapy,  who I saw most recently back in March.  At that time, she complained of some  problems with memory and thought that it might be related to her statin  therapy.  Her Zocor was discontinued.  In fact, she thinks that her memory  improved.  Unfortunately, her cholesterol increased, and she was placed on  Crestor by Dr. Debby Bud.  She returns today for followup.  The patient has had  no palpitations and denies shortness of breath.  She denies peripheral  edema.  She does note occasional aches and pains, particularly in her hip.  She denies significant muscle aches.   PHYSICAL EXAMINATION:  GENERAL:  She is a pleasant well-appearing woman in  no acute distress.  VITAL SIGNS:  Blood pressure 130/76, pulse 65 and regular, respirations 18.  The weight was 190 pounds.  NECK:  No jugular venous distention.  LUNGS:  Clear bilaterally to auscultation.  There are no wheezes, rales or  rhonchi.  CARDIOVASCULAR:  Regular rate and rhythm with normal S1 and S2.  EXTREMITIES:  No clubbing, cyanosis or edema.   EKG today demonstrates a sinus rhythm with a prolongation of the Q-T  interval with a corrected Q-T of approximately 480-90 ms.   IMPRESSION:  1. Paroxysmal atrial arrhythmias.  2. Status post ablation.  3. Chronic low dose amiodarone therapy.  4. Dyslipidemia, on statin therapy.   DISCUSSION:  Overall, [name] [name] [name] is very stable.  As she has now been on  Crestor for a couple of months, I have asked that we obtain fasting  lipids  with TSH, liver panel, and a CPK to assess how she is tolerating her statin  therapy (Crestor) in conjunction with her amiodarone.  In addition to this,  I have asked that she decrease her Pacerone down to 100 mg a day on Friday,  Saturday, and Sunday, 200 mg a day on Monday through Thursday.  We will plan  to see her back in the office in six months.                                  Doylene Canning. Ladona Ridgel, MD   GWT/MedQ  DD:  08/15/2006  DT:  08/16/2006  Job #:  [id number]

## 2011-04-27 ENCOUNTER — Ambulatory Visit (INDEPENDENT_AMBULATORY_CARE_PROVIDER_SITE_OTHER): Payer: Medicare Other | Admitting: *Deleted

## 2011-04-27 DIAGNOSIS — I4891 Unspecified atrial fibrillation: Secondary | ICD-10-CM

## 2011-04-27 DIAGNOSIS — I4892 Unspecified atrial flutter: Secondary | ICD-10-CM

## 2011-04-27 LAB — POCT INR: INR: 2.6

## 2011-05-24 ENCOUNTER — Other Ambulatory Visit: Payer: Self-pay | Admitting: Internal Medicine

## 2011-05-25 ENCOUNTER — Ambulatory Visit (INDEPENDENT_AMBULATORY_CARE_PROVIDER_SITE_OTHER): Payer: Medicare Other | Admitting: *Deleted

## 2011-05-25 DIAGNOSIS — I4892 Unspecified atrial flutter: Secondary | ICD-10-CM

## 2011-05-25 DIAGNOSIS — I4891 Unspecified atrial fibrillation: Secondary | ICD-10-CM

## 2011-05-25 LAB — POCT INR: INR: 2.2

## 2011-06-22 ENCOUNTER — Ambulatory Visit (INDEPENDENT_AMBULATORY_CARE_PROVIDER_SITE_OTHER): Payer: Medicare Other | Admitting: *Deleted

## 2011-06-22 DIAGNOSIS — I4892 Unspecified atrial flutter: Secondary | ICD-10-CM

## 2011-06-22 DIAGNOSIS — I4891 Unspecified atrial fibrillation: Secondary | ICD-10-CM

## 2011-06-22 LAB — POCT INR: INR: 2.1

## 2011-07-10 ENCOUNTER — Encounter: Payer: Self-pay | Admitting: Internal Medicine

## 2011-07-10 ENCOUNTER — Other Ambulatory Visit (INDEPENDENT_AMBULATORY_CARE_PROVIDER_SITE_OTHER): Payer: Medicare Other

## 2011-07-10 ENCOUNTER — Ambulatory Visit (INDEPENDENT_AMBULATORY_CARE_PROVIDER_SITE_OTHER): Payer: Medicare Other | Admitting: Internal Medicine

## 2011-07-10 DIAGNOSIS — R209 Unspecified disturbances of skin sensation: Secondary | ICD-10-CM

## 2011-07-10 DIAGNOSIS — I10 Essential (primary) hypertension: Secondary | ICD-10-CM

## 2011-07-10 DIAGNOSIS — R203 Hyperesthesia: Secondary | ICD-10-CM

## 2011-07-10 DIAGNOSIS — E785 Hyperlipidemia, unspecified: Secondary | ICD-10-CM

## 2011-07-10 LAB — COMPREHENSIVE METABOLIC PANEL
ALT: 30 U/L (ref 0–35)
AST: 30 U/L (ref 0–37)
Albumin: 4.4 g/dL (ref 3.5–5.2)
Alkaline Phosphatase: 83 U/L (ref 39–117)
BUN: 18 mg/dL (ref 6–23)
CO2: 27 mEq/L (ref 19–32)
Calcium: 9.5 mg/dL (ref 8.4–10.5)
Chloride: 102 mEq/L (ref 96–112)
Creatinine, Ser: 0.7 mg/dL (ref 0.4–1.2)
GFR: 84.67 mL/min (ref >60.00)
Glucose, Bld: 121 mg/dL — ABNORMAL HIGH (ref 70–99)
Potassium: 3.8 mEq/L (ref 3.5–5.1)
Sodium: 140 mEq/L (ref 135–145)
Total Bilirubin: 1.6 mg/dL — ABNORMAL HIGH (ref 0.3–1.2)
Total Protein: 7.8 g/dL (ref 6.0–8.3)

## 2011-07-10 LAB — LIPID PANEL
Cholesterol: 180 mg/dL (ref 0–200)
HDL: 98.6 mg/dL (ref >39.00)
LDL Cholesterol: 71 mg/dL (ref 0–99)
Total CHOL/HDL Ratio: 2
Triglycerides: 50 mg/dL (ref 0.0–149.0)
VLDL: 10 mg/dL (ref 0.0–40.0)

## 2011-07-10 LAB — HEPATIC FUNCTION PANEL
ALT: 30 U/L (ref 0–35)
AST: 30 U/L (ref 0–37)
Albumin: 4.4 g/dL (ref 3.5–5.2)
Alkaline Phosphatase: 83 U/L (ref 39–117)
Bilirubin, Direct: 0.3 mg/dL (ref 0.0–0.3)
Total Bilirubin: 1.6 mg/dL — ABNORMAL HIGH (ref 0.3–1.2)
Total Protein: 7.8 g/dL (ref 6.0–8.3)

## 2011-07-10 LAB — TSH: TSH: 2.71 u[IU]/mL (ref 0.35–5.50)

## 2011-07-10 MED ORDER — VALACYCLOVIR HCL 1 G PO TABS: 1000.0000 mg | ORAL_TABLET | Freq: Three times a day (TID) | ORAL | Status: AC

## 2011-07-10 MED ORDER — TRAMADOL HCL 50 MG PO TABS: 50.0000 mg | ORAL_TABLET | Freq: Four times a day (QID) | ORAL | Status: DC | PRN

## 2011-07-11 NOTE — Assessment & Plan Note (Signed)
Due for routine lab follow up.  Addendum: HDL 98.60, LDL 71. LFTs normal  Plan - continue present medications

## 2011-07-11 NOTE — Assessment & Plan Note (Signed)
Good control of BP  Plan- continue present medication.

## 2011-07-11 NOTE — Progress Notes (Signed)
  Subjective:    Patient ID: [name] [name] [facility], female    DOB: [birth year], 77 y.o.   MRN: [id number]  HPI Mrs Peckenpaugh presents with a week long h/o hyperesthesia that started at the right scapula and radiated initially to her right arm and then to he right chest/breast. She has not seen a rash. She has had no injury, denies a feeling of deep muscle strain, has had no limitation in movement, no loss of strength. She has had no relief with APAP.   I have reviewed the patient's medical history in detail and updated the computerized patient record.    Review of Systems System review is negative for any constitutional, cardiac, pulmonary, GI or neuro symptoms or complaints     Objective:   Physical Exam Vitals noted - good BP control Gen'l-elderly white woman in no acute distress Cor - 2+ radialy pulse, regular rate Resp - normal Derm - hypersensitive to touch, no rash, no lesions       Assessment & Plan:  Hyperesthesia - patients pain is suggestive of prodromal pain associated with zoster. Review of "UpToDate" reveals that there can be prodromal pain for as long a two weeks prior to the eruption of a rash.  Plan- Valtrex 1 g tid x 7           Tramadol 50 mg q 6 as needed for pain along with APAP

## 2011-07-15 ENCOUNTER — Other Ambulatory Visit: Payer: Self-pay | Admitting: Internal Medicine

## 2011-07-17 ENCOUNTER — Encounter: Payer: Self-pay | Admitting: Internal Medicine

## 2011-07-20 ENCOUNTER — Encounter: Payer: Medicare Other | Admitting: *Deleted

## 2011-07-25 ENCOUNTER — Ambulatory Visit (INDEPENDENT_AMBULATORY_CARE_PROVIDER_SITE_OTHER): Payer: Medicare Other | Admitting: *Deleted

## 2011-07-25 ENCOUNTER — Other Ambulatory Visit: Payer: Self-pay | Admitting: Internal Medicine

## 2011-07-25 DIAGNOSIS — I4891 Unspecified atrial fibrillation: Secondary | ICD-10-CM

## 2011-07-25 LAB — POCT INR: INR: 1.8

## 2011-07-26 ENCOUNTER — Other Ambulatory Visit: Payer: Self-pay | Admitting: Internal Medicine

## 2011-08-15 ENCOUNTER — Ambulatory Visit (INDEPENDENT_AMBULATORY_CARE_PROVIDER_SITE_OTHER): Payer: Medicare Other | Admitting: *Deleted

## 2011-08-15 DIAGNOSIS — I4891 Unspecified atrial fibrillation: Secondary | ICD-10-CM

## 2011-08-15 DIAGNOSIS — I4892 Unspecified atrial flutter: Secondary | ICD-10-CM

## 2011-08-15 LAB — POCT INR: INR: 2.6

## 2011-08-21 ENCOUNTER — Other Ambulatory Visit: Payer: Self-pay | Admitting: Internal Medicine

## 2011-09-12 ENCOUNTER — Ambulatory Visit (INDEPENDENT_AMBULATORY_CARE_PROVIDER_SITE_OTHER): Payer: Medicare Other | Admitting: *Deleted

## 2011-09-12 DIAGNOSIS — I4891 Unspecified atrial fibrillation: Secondary | ICD-10-CM

## 2011-09-12 DIAGNOSIS — I4892 Unspecified atrial flutter: Secondary | ICD-10-CM

## 2011-09-12 LAB — POCT INR: INR: 1.8

## 2011-10-03 ENCOUNTER — Encounter: Payer: Medicare Other | Admitting: *Deleted

## 2011-10-05 ENCOUNTER — Ambulatory Visit (INDEPENDENT_AMBULATORY_CARE_PROVIDER_SITE_OTHER): Payer: Medicare Other | Admitting: *Deleted

## 2011-10-05 DIAGNOSIS — I4892 Unspecified atrial flutter: Secondary | ICD-10-CM

## 2011-10-05 DIAGNOSIS — I4891 Unspecified atrial fibrillation: Secondary | ICD-10-CM

## 2011-10-05 LAB — POCT INR: INR: 2.9

## 2011-10-22 ENCOUNTER — Other Ambulatory Visit: Payer: Self-pay | Admitting: Internal Medicine

## 2011-11-07 ENCOUNTER — Encounter: Payer: Self-pay | Admitting: Internal Medicine

## 2011-11-07 ENCOUNTER — Ambulatory Visit (INDEPENDENT_AMBULATORY_CARE_PROVIDER_SITE_OTHER): Payer: Medicare Other | Admitting: Internal Medicine

## 2011-11-07 ENCOUNTER — Ambulatory Visit (INDEPENDENT_AMBULATORY_CARE_PROVIDER_SITE_OTHER): Payer: Medicare Other | Admitting: *Deleted

## 2011-11-07 DIAGNOSIS — I4891 Unspecified atrial fibrillation: Secondary | ICD-10-CM

## 2011-11-07 DIAGNOSIS — I10 Essential (primary) hypertension: Secondary | ICD-10-CM

## 2011-11-07 DIAGNOSIS — I4892 Unspecified atrial flutter: Secondary | ICD-10-CM

## 2011-11-07 LAB — POCT INR: INR: 3.1

## 2011-11-07 NOTE — Progress Notes (Signed)
HPI [name] [name] returns today for followup. She is a very pleasant 77 year old woman with a history of paroxysmal atrial fibrillation which has been well-controlled on amiodarone. She has hypertension, and dyslipidemia. She had initially lost 10 pounds but gained it back. She has had no syncope. She has rare palpitations. Allergies  Allergen Reactions  . Oxycodone-Acetaminophen      Current Outpatient Prescriptions  Medication Sig Dispense Refill  . amiodarone (PACERONE) 200 MG tablet 1 BY MOUTH ONCE DAILY AS DIRECTED (CURRENT: 1 TAB DAILY / 0. 5 TABS FRI/SAT/SUN)  30 tablet  5  . atorvastatin (LIPITOR) 20 MG tablet TAKE 1 TABLET AT BEDTIME  90 tablet  2  . Calcium Carbonate-Vitamin D (CALCIUM-VITAMIN D) 500-200 MG-UNIT per tablet Take 1 tablet by mouth daily.        . hydrochlorothiazide (HYDRODIURIL) 25 MG tablet TAKE 1 TABLET DAILY  90 tablet  0  . metoprolol (LOPRESSOR) 50 MG tablet TAKE 1/2 TABLET BY MOUTH TWICE A DAY  90 tablet  1  . Multiple Vitamins-Minerals (MULTIVITAMIN WITH MINERALS) tablet Take 1 tablet by mouth daily.        . traMADol (ULTRAM) 50 MG tablet Take 1 tablet (50 mg total) by mouth every 6 (six) hours as needed for pain.  120 tablet  5  . warfarin (COUMADIN) 2.5 MG tablet TAKE 1 TABLET DAILY, EXCEPT TAKE 2 TABLETS ON MONDAYS AND WEDNESDAYS  116 tablet  1     Past Medical History  Diagnosis Date  . Aftercare for healing traumatic fracture of arm, unspecified   . Malignant melanoma of skin of trunk, except scrotum   . Rosacea   . Sprain of lumbar region   . Sciatica     right  . Other postprocedural status   . Hyperlipidemia   . H/O: hysterectomy   . S/P cholecystectomy   . H/O: hemorrhoidectomy   . Hypertension   . A-fib     paroxysmal    ROS:   All systems reviewed and negative except as noted in the HPI.   Past Surgical History  Procedure Date  . Catheter ablation of atrail flutter   . Hernia repair   . Bladder suspension   . Abdominal  hysterectomy   . Cholecystectomy   . Hemorrhoid surgery      No family history on file.   History   Social History  . Marital Status: Married    Spouse Name: N/A    Number of Children: N/A  . Years of Education: N/A   Occupational History  . Not on file.   Social History Main Topics  . Smoking status: Former Games developer  . Smokeless tobacco: Not on file  . Alcohol Use: Not on file  . Drug Use: Not on file  . Sexually Active: Not on file   Other Topics Concern  . 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 HEART:  Regular rate rhythm, no murmurs, no rubs, no clicks Abd:  soft,  positive bowel sounds, no organomegally, no rebound, no guarding Ext:  2 plus pulses, no edema, no cyanosis, no clubbing Skin:  No rashes no nodules Neuro:  CN II through XII intact, motor grossly intact  EKG Normal sinus rhythm with sinus bradycardia and first degree AV block.  Assess/Plan:

## 2011-11-07 NOTE — Assessment & Plan Note (Signed)
Her blood pressure is well controlled. She will continue her current medical therapy and maintain a low-sodium diet. 

## 2011-11-07 NOTE — Assessment & Plan Note (Signed)
He appears to be maintaining sinus rhythm on low dose amiodarone. I made no changes in her medical regimen today. I have encouraged her to increase her activity.

## 2011-11-23 ENCOUNTER — Other Ambulatory Visit: Payer: Self-pay | Admitting: *Deleted

## 2011-11-23 MED ORDER — WARFARIN SODIUM 2.5 MG PO TABS: 2.5000 mg | ORAL_TABLET | Freq: Every day | ORAL | Status: DC

## 2011-12-01 ENCOUNTER — Other Ambulatory Visit: Payer: Self-pay | Admitting: Internal Medicine

## 2011-12-01 MED ORDER — AMIODARONE HCL 200 MG PO TABS: 200.0000 mg | ORAL_TABLET | Freq: Every day | ORAL | Status: DC

## 2011-12-01 NOTE — Telephone Encounter (Signed)
New Msg: pt calling wanting amiordarone 200mg  called in with 90 day supply with 3 refills. (NOT 30 day supply with 3 refills). Please call this RX in.

## 2011-12-04 ENCOUNTER — Other Ambulatory Visit: Payer: Self-pay | Admitting: *Deleted

## 2011-12-04 MED ORDER — WARFARIN SODIUM 2.5 MG PO TABS: 2.5000 mg | ORAL_TABLET | Freq: Every day | ORAL | Status: DC

## 2011-12-04 NOTE — Telephone Encounter (Signed)
Warfarin request. Done.

## 2011-12-05 ENCOUNTER — Ambulatory Visit (INDEPENDENT_AMBULATORY_CARE_PROVIDER_SITE_OTHER): Payer: Medicare Other | Admitting: *Deleted

## 2011-12-05 DIAGNOSIS — I4891 Unspecified atrial fibrillation: Secondary | ICD-10-CM

## 2011-12-05 DIAGNOSIS — I4892 Unspecified atrial flutter: Secondary | ICD-10-CM | POA: Diagnosis not present

## 2011-12-05 LAB — POCT INR: INR: 2.4

## 2011-12-06 ENCOUNTER — Encounter: Payer: Self-pay | Admitting: Internal Medicine

## 2011-12-12 ENCOUNTER — Other Ambulatory Visit: Payer: Self-pay | Admitting: Internal Medicine

## 2011-12-30 ENCOUNTER — Other Ambulatory Visit: Payer: Self-pay | Admitting: Internal Medicine

## 2012-01-02 ENCOUNTER — Ambulatory Visit (INDEPENDENT_AMBULATORY_CARE_PROVIDER_SITE_OTHER): Payer: Medicare Other | Admitting: *Deleted

## 2012-01-02 DIAGNOSIS — I4891 Unspecified atrial fibrillation: Secondary | ICD-10-CM

## 2012-01-02 DIAGNOSIS — I4892 Unspecified atrial flutter: Secondary | ICD-10-CM

## 2012-01-02 LAB — POCT INR: INR: 2.4

## 2012-01-30 ENCOUNTER — Ambulatory Visit (INDEPENDENT_AMBULATORY_CARE_PROVIDER_SITE_OTHER): Payer: Medicare Other | Admitting: *Deleted

## 2012-01-30 DIAGNOSIS — I4891 Unspecified atrial fibrillation: Secondary | ICD-10-CM | POA: Diagnosis not present

## 2012-01-30 DIAGNOSIS — I4892 Unspecified atrial flutter: Secondary | ICD-10-CM

## 2012-01-30 LAB — POCT INR: INR: 2.7

## 2012-02-27 ENCOUNTER — Ambulatory Visit (INDEPENDENT_AMBULATORY_CARE_PROVIDER_SITE_OTHER): Payer: Medicare Other

## 2012-02-27 DIAGNOSIS — I4892 Unspecified atrial flutter: Secondary | ICD-10-CM | POA: Diagnosis not present

## 2012-02-27 DIAGNOSIS — I4891 Unspecified atrial fibrillation: Secondary | ICD-10-CM

## 2012-02-27 LAB — POCT INR: INR: 2.9

## 2012-04-09 ENCOUNTER — Ambulatory Visit (INDEPENDENT_AMBULATORY_CARE_PROVIDER_SITE_OTHER): Payer: Medicare Other

## 2012-04-09 DIAGNOSIS — I4891 Unspecified atrial fibrillation: Secondary | ICD-10-CM | POA: Diagnosis not present

## 2012-04-09 DIAGNOSIS — I4892 Unspecified atrial flutter: Secondary | ICD-10-CM

## 2012-04-09 LAB — POCT INR: INR: 2.7

## 2012-04-12 ENCOUNTER — Other Ambulatory Visit: Payer: Self-pay | Admitting: Internal Medicine

## 2012-04-15 ENCOUNTER — Other Ambulatory Visit: Payer: Self-pay | Admitting: *Deleted

## 2012-04-15 MED ORDER — HYDROCHLOROTHIAZIDE 25 MG PO TABS: 25.0000 mg | ORAL_TABLET | Freq: Every day | ORAL | Status: DC

## 2012-04-15 NOTE — Telephone Encounter (Signed)
Rx sent to pharmacy with note that patient needs to schedule annual exam with MD

## 2012-04-24 ENCOUNTER — Other Ambulatory Visit: Payer: Self-pay | Admitting: Internal Medicine

## 2012-04-25 NOTE — Telephone Encounter (Signed)
PATIENT NOTIFIED OF NEED TO MAKE APPT. WITH MD FOR FURTHER REFILLS AND MED SENT TO [facility].

## 2012-05-07 ENCOUNTER — Emergency Department (HOSPITAL_COMMUNITY)
Admission: EM | Admit: 2012-05-07 | Discharge: 2012-05-07 | Disposition: A | Discharge status: Home / Self Care | Payer: No Typology Code available for payment source | Attending: Emergency Medicine | Admitting: Emergency Medicine

## 2012-05-07 ENCOUNTER — Emergency Department (HOSPITAL_COMMUNITY): Payer: No Typology Code available for payment source

## 2012-05-07 DIAGNOSIS — S0083XA Contusion of other part of head, initial encounter: Secondary | ICD-10-CM | POA: Diagnosis not present

## 2012-05-07 DIAGNOSIS — I10 Essential (primary) hypertension: Secondary | ICD-10-CM | POA: Insufficient documentation

## 2012-05-07 DIAGNOSIS — Z79899 Other long term (current) drug therapy: Secondary | ICD-10-CM | POA: Diagnosis not present

## 2012-05-07 DIAGNOSIS — E785 Hyperlipidemia, unspecified: Secondary | ICD-10-CM | POA: Insufficient documentation

## 2012-05-07 DIAGNOSIS — S40019A Contusion of unspecified shoulder, initial encounter: Secondary | ICD-10-CM | POA: Diagnosis not present

## 2012-05-07 DIAGNOSIS — S0003XA Contusion of scalp, initial encounter: Secondary | ICD-10-CM | POA: Diagnosis not present

## 2012-05-07 DIAGNOSIS — M25519 Pain in unspecified shoulder: Secondary | ICD-10-CM | POA: Diagnosis not present

## 2012-05-07 DIAGNOSIS — W010XXA Fall on same level from slipping, tripping and stumbling without subsequent striking against object, initial encounter: Secondary | ICD-10-CM | POA: Insufficient documentation

## 2012-05-07 DIAGNOSIS — S1093XA Contusion of unspecified part of neck, initial encounter: Secondary | ICD-10-CM | POA: Insufficient documentation

## 2012-05-07 DIAGNOSIS — T1490XA Injury, unspecified, initial encounter: Secondary | ICD-10-CM | POA: Diagnosis not present

## 2012-05-07 DIAGNOSIS — Z7901 Long term (current) use of anticoagulants: Secondary | ICD-10-CM | POA: Insufficient documentation

## 2012-05-07 DIAGNOSIS — S0990XA Unspecified injury of head, initial encounter: Secondary | ICD-10-CM | POA: Diagnosis not present

## 2012-05-07 DIAGNOSIS — T148XXA Other injury of unspecified body region, initial encounter: Secondary | ICD-10-CM | POA: Diagnosis not present

## 2012-05-07 DIAGNOSIS — I4891 Unspecified atrial fibrillation: Secondary | ICD-10-CM | POA: Insufficient documentation

## 2012-05-07 DIAGNOSIS — W19XXXA Unspecified fall, initial encounter: Secondary | ICD-10-CM

## 2012-05-07 DIAGNOSIS — Y9229 Other specified public building as the place of occurrence of the external cause: Secondary | ICD-10-CM | POA: Insufficient documentation

## 2012-05-07 MED ORDER — TRAMADOL HCL 50 MG PO TABS: 50.0000 mg | ORAL_TABLET | Freq: Four times a day (QID) | ORAL | Status: AC | PRN

## 2012-05-07 NOTE — ED Notes (Signed)
Patient is AOx4 and comfortable with her discharge instructions.  Family is driving her home.

## 2012-05-07 NOTE — ED Notes (Signed)
Per EMS, the patient caught her foot on the edge of her grocery cart while turning to place a watermelon in the lower compartment.  She slipped and fell hitting her head just over her left eye.  She also hit her left shoulder and both knees.  The patient denies decreased loc.  The patient takes warfarin.

## 2012-05-07 NOTE — ED Notes (Signed)
Patient transported to CT 

## 2012-05-07 NOTE — ED Notes (Signed)
Patient transported to X-ray 

## 2012-05-07 NOTE — ED Provider Notes (Signed)
History     CSN: [id number]  Arrival date & time 05/07/12  2103   First MD Initiated Contact with Patient 05/07/12 2105      Chief Complaint  Patient presents with  . Fall  . Facial Injury    left eye bruising  . Shoulder Injury    left  . Knee Injury    bilateral    (Consider location/radiation/quality/duration/timing/severity/associated sxs/prior treatment) Patient is a 78 y.o. female presenting with fall, facial injury, and shoulder injury. The history is provided by the patient.  Fall Pertinent negatives include no numbness, no abdominal pain, no nausea, no vomiting and no headaches.  Facial Injury  Pertinent negatives include no chest pain, no numbness, no abdominal pain, no nausea, no vomiting, no headaches and no weakness.  Shoulder Injury Pertinent negatives include no chest pain, no abdominal pain, no headaches and no shortness of breath.   patient tripped and fell while trying to place a watermelon in her grocery cart. She hit her head over the left eye. Is complaining of pain in her left shoulder. No loss of consciousness. No headache. No confusion. She is on Coumadin for atrial fibrillation. No chest or abdominal pain. States she's had previous surgery on her left shoulder. No trouble seeing. No double vision. No headache. No bleeding.  Past Medical History  Diagnosis Date  . Aftercare for healing traumatic fracture of arm, unspecified   . Malignant melanoma of skin of trunk, except scrotum   . Rosacea   . Sprain of lumbar region   . Sciatica     right  . Other postprocedural status   . Hyperlipidemia   . H/O: hysterectomy   . S/P cholecystectomy   . H/O: hemorrhoidectomy   . Hypertension   . A-fib     paroxysmal    Past Surgical History  Procedure Date  . Catheter ablation of atrail flutter   . Hernia repair   . Bladder suspension   . Abdominal hysterectomy   . Cholecystectomy   . Hemorrhoid surgery     No family history on file.  History    Substance Use Topics  . Smoking status: Former Games developer  . Smokeless tobacco: Not on file  . Alcohol Use: Not on file    OB History    Grav Para Term Preterm Abortions TAB SAB Ect Mult Living                  Review of Systems  Constitutional: Negative for activity change and appetite change.  HENT: Negative for neck stiffness.   Eyes: Negative for pain.  Respiratory: Negative for chest tightness and shortness of breath.   Cardiovascular: Negative for chest pain and leg swelling.  Gastrointestinal: Negative for nausea, vomiting, abdominal pain and diarrhea.  Genitourinary: Negative for flank pain.  Musculoskeletal: Negative for back pain.       Left shoulder pain  Skin: Negative for rash.  Neurological: Negative for weakness, numbness and headaches.  Hematological: Bruises/bleeds easily.    Allergies  Oxycodone-acetaminophen  Home Medications   Current Outpatient Rx  Name Route Sig Dispense Refill  . ACETAMINOPHEN 500 MG PO TABS Oral Take 1,000 mg by mouth every 6 (six) hours as needed. For pain    . AMIODARONE HCL 200 MG PO TABS Oral Take 200 mg by mouth daily.    . ATORVASTATIN CALCIUM 20 MG PO TABS Oral Take 20 mg by mouth daily.    Marland Kitchen CALCIUM-VITAMIN D 500-200 MG-UNIT PO TABS Oral Take  1 tablet by mouth daily.      Marland Kitchen HYDROCHLOROTHIAZIDE 25 MG PO TABS Oral Take 25 mg by mouth daily.    Marland Kitchen METOPROLOL TARTRATE 50 MG PO TABS Oral Take 25 mg by mouth 2 (two) times daily.     . MULTI-VITAMIN/MINERALS PO TABS Oral Take 1 tablet by mouth daily.      . WARFARIN SODIUM 2.5 MG PO TABS Oral Take 2.5 mg by mouth daily. Take 2 tablets on Wed. Take 1 tablet all other days    . TRAMADOL HCL 50 MG PO TABS Oral Take 1 tablet (50 mg total) by mouth every 6 (six) hours as needed for pain. 15 tablet 0    BP 161/68  Pulse 73  Temp(Src) 98.6 F (37 C) (Oral)  Resp 20  SpO2 99%  Physical Exam  Constitutional: She is oriented to person, place, and time. She appears well-developed and  well-nourished.  HENT:       Approximately 2 cm abrasion and mild hematoma to left forehead. Approximately 1.5 cm hematoma to left orbital ridge area. No crepitance or deformity. No tenderness under hematoma. No bleeding. Extraocular movements are intact. No nystagmus. No hemotympanum  Eyes: Conjunctivae are normal. Pupils are equal, round, and reactive to light.  Neck: Normal range of motion. Neck supple.       Cervical spine cleared by nexus criteria  Cardiovascular: Normal rate and regular rhythm.   Pulmonary/Chest: Effort normal and breath sounds normal.  Abdominal: Soft. There is no tenderness.  Musculoskeletal: She exhibits tenderness.       Tenderness (anterior shoulder. Decreased range of motion. Possible mild crepitance. Neurovascularly intact distally. Decreased range of motion due to pain.  Neurological: She is alert and oriented to person, place, and time.  Skin: Skin is warm.    ED Course  Procedures (including critical care time)  Labs Reviewed - No data to display Ct Head Wo Contrast  05/07/2012  *RADIOLOGY REPORT*  Clinical Data: History of trauma from a fall.  CT HEAD WITHOUT CONTRAST  Technique:  Contiguous axial images were obtained from the base of the skull through the vertex without contrast.  Comparison: No priors.  Findings: No acute displaced skull fractures are identified.  No acute intracranial abnormalities.  Specifically, no evidence of acute post-traumatic intracranial hemorrhage, no definite regions of acute/subacute cerebral ischemia, no focal mass, mass effect, hydrocephalus or abnormal intra or extra-axial fluid collections. Visualized paranasal sinuses and mastoids are well pneumatized.  IMPRESSION: 1.  No acute displaced skull fractures or evidence of significant acute intracranial trauma. 2.  The appearance of the brain is normal.  Original Report Authenticated By: Florencia Reasons, M.D.   Dg Shoulder Left  05/07/2012  *RADIOLOGY REPORT*  Clinical Data:  Fall with pain.  LEFT SHOULDER - 2+ VIEW  Comparison: None.  Findings: Visualized portion of the left hemithorax is normal. Prior left proximal humeral plate screw fixation.  No hardware complication or acute fracture.  Mild osteopenia.  Degenerative irregularity of the undersurface of the acromion.  IMPRESSION: Surgical changes, without acute osseous finding.  Original Report Authenticated By: Consuello Bossier, M.D.     1. Fall   2. Facial hematoma   3. Shoulder contusion       MDM  Patient with fall and minor head injury on Coumadin. No loss of conscious. Small hematoma. Negative CT of her head. She also has left shoulder pain with a negative x-ray. Visual follow up with Ortho-Est needed for shoulder. She's given  instructions on what to watch for for injury and possible delayed bleed. She'll be discharged home with pain medicines.        Juliet Rude. Rubin Payor, MD 05/07/12 2207

## 2012-05-07 NOTE — Discharge Instructions (Signed)
Contusion A contusion is a deep bruise. Contusions happen when an injury causes bleeding under the skin. Signs of bruising include pain, puffiness (swelling), and discolored skin. The contusion may turn blue, purple, or yellow. HOME CARE   Put ice on the injured area.   Put ice in a plastic bag.   Place a towel between your skin and the bag.   Leave the ice on for 15 to 20 minutes, 3 to 4 times a day.   Only take medicine as told by your doctor.   Rest the injured area.   If possible, raise (elevate) the injured area to lessen puffiness.  GET HELP RIGHT AWAY IF:   You have more bruising or puffiness.   You have pain that is getting worse.   Your puffiness or pain is not helped by medicine.  MAKE SURE YOU:   Understand these instructions.   Will watch your condition.   Will get help right away if you are not doing well or get worse.  Document Released: 05/01/2008 Document Revised: 11/02/2011 Document Reviewed: 09/18/2011 [facility] Patient Information 2012 [place], [place].Facial and Scalp Contusions You have a contusion (bruise) on your face or scalp. Injuries around the face and head generally cause a lot of swelling, especially around the eyes. This is because the blood supply to this area is good and tissues are loose. Swelling from a contusion is usually better in 2-3 days. It may take a week or longer for a "black eye" to clear up completely. HOME CARE INSTRUCTIONS   Apply ice packs to the injured area for about 15 to 20 minutes, 3 to 4 times a day, for the first couple days. This helps keep swelling down.   Use mild pain medicine as needed or instructed by your caregiver.   You may have a mild headache, slight dizziness, nausea, and weakness for a few days. This usually clears up with bed rest and mild pain medications.   Contact your caregiver if you are concerned about facial defects or have any difficulty with your bite or develop pain with chewing.  SEEK IMMEDIATE  MEDICAL CARE IF:  You develop severe pain or a headache, unrelieved by medication.   You develop unusual sleepiness, confusion, personality changes, or vomiting.   You have a persistent nosebleed, double or blurred vision, or drainage from the nose or ear.   You have difficulty walking or using your arms or legs.  MAKE SURE YOU:   Understand these instructions.   Will watch your condition.   Will get help right away if you are not doing well or get worse.  Document Released: 12/21/2004 Document Revised: 11/02/2011 Document Reviewed: 09/29/2011 Head Injury, Adult A head injury happens when the head is hit really hard. A head injury may cause sleepiness, headache, throwing up (vomiting), and problems seeing. If the head injury is really bad, you may need to stay in the hospital. HOME CARE  Have someone with you for the first 24 hours. This person should wake you up every 1 hour to check on your condition.   Only drink water or clear fluid for the rest of the day. Then, go back to your regular diet.   Only take medicines as told by your doctor. Do not take aspirin.   Do not drink alcohol for 2 days.   Do not take medicines that help your relax (sedatives) for 2 days.  Side effects may happen for up to 7 to 10 days. Watch for new problems.  GET HELP RIGHT AWAY IF:   You are confused or sleepy.   You cannot be woken up.   You feel sick to your stomach (nauseous) or keep throwing up.   Your dizziness or unsteadiness is get worse, or your cannot walk.   You start to shake (convulse) or pass out (faint).   You have very bad, lasting headaches that are not helped by medicine.   You cannot use your arms or legs like normal.   You have clear or bloody fluid coming from your nose or ears.  MAKE SURE YOU:   Understand these instructions.   Will watch your condition.   Will get help right away if you are not doing well or get worse.  Document Released: 10/26/2008 Document  Revised: 11/02/2011 Document Reviewed: 09/29/2009 [facility] Patient Information 2012 [place], [place].

## 2012-05-09 ENCOUNTER — Ambulatory Visit (INDEPENDENT_AMBULATORY_CARE_PROVIDER_SITE_OTHER): Payer: Medicare Other | Admitting: Pharmacist

## 2012-05-09 DIAGNOSIS — I4892 Unspecified atrial flutter: Secondary | ICD-10-CM

## 2012-05-09 DIAGNOSIS — I4891 Unspecified atrial fibrillation: Secondary | ICD-10-CM | POA: Diagnosis not present

## 2012-05-09 LAB — POCT INR: INR: 3.1

## 2012-05-27 ENCOUNTER — Ambulatory Visit (INDEPENDENT_AMBULATORY_CARE_PROVIDER_SITE_OTHER): Payer: Medicare Other | Admitting: Internal Medicine

## 2012-05-27 ENCOUNTER — Encounter: Payer: Self-pay | Admitting: Internal Medicine

## 2012-05-27 ENCOUNTER — Other Ambulatory Visit (INDEPENDENT_AMBULATORY_CARE_PROVIDER_SITE_OTHER): Payer: Medicare Other

## 2012-05-27 VITALS — BP 142/82 | HR 72 | Temp 97.3°F | Resp 16 | Wt 195.0 lb

## 2012-05-27 DIAGNOSIS — I4891 Unspecified atrial fibrillation: Secondary | ICD-10-CM | POA: Diagnosis not present

## 2012-05-27 DIAGNOSIS — R7309 Other abnormal glucose: Secondary | ICD-10-CM | POA: Diagnosis not present

## 2012-05-27 DIAGNOSIS — I10 Essential (primary) hypertension: Secondary | ICD-10-CM

## 2012-05-27 DIAGNOSIS — E785 Hyperlipidemia, unspecified: Secondary | ICD-10-CM

## 2012-05-27 DIAGNOSIS — Z23 Encounter for immunization: Secondary | ICD-10-CM | POA: Diagnosis not present

## 2012-05-27 DIAGNOSIS — Z0000 Encounter for general adult medical examination without abnormal findings: Secondary | ICD-10-CM

## 2012-05-27 DIAGNOSIS — R739 Hyperglycemia, unspecified: Secondary | ICD-10-CM

## 2012-05-27 LAB — HEPATIC FUNCTION PANEL
ALT: 24 U/L (ref 0–35)
AST: 25 U/L (ref 0–37)
Albumin: 3.9 g/dL (ref 3.5–5.2)
Alkaline Phosphatase: 102 U/L (ref 39–117)
Bilirubin, Direct: 0.3 mg/dL (ref 0.0–0.3)
Total Bilirubin: 1.6 mg/dL — ABNORMAL HIGH (ref 0.3–1.2)
Total Protein: 7.5 g/dL (ref 6.0–8.3)

## 2012-05-27 LAB — LIPID PANEL
Cholesterol: 180 mg/dL (ref 0–200)
HDL: 89 mg/dL (ref >39.00)
LDL Cholesterol: 77 mg/dL (ref 0–99)
Total CHOL/HDL Ratio: 2
Triglycerides: 72 mg/dL (ref 0.0–149.0)
VLDL: 14.4 mg/dL (ref 0.0–40.0)

## 2012-05-27 LAB — COMPREHENSIVE METABOLIC PANEL
ALT: 24 U/L (ref 0–35)
AST: 25 U/L (ref 0–37)
Albumin: 3.9 g/dL (ref 3.5–5.2)
Alkaline Phosphatase: 102 U/L (ref 39–117)
BUN: 21 mg/dL (ref 6–23)
CO2: 27 mEq/L (ref 19–32)
Calcium: 9.9 mg/dL (ref 8.4–10.5)
Chloride: 103 mEq/L (ref 96–112)
Creatinine, Ser: 0.8 mg/dL (ref 0.4–1.2)
GFR: 79.3 mL/min (ref >60.00)
Glucose, Bld: 148 mg/dL — ABNORMAL HIGH (ref 70–99)
Potassium: 4.6 mEq/L (ref 3.5–5.1)
Sodium: 140 mEq/L (ref 135–145)
Total Bilirubin: 1.6 mg/dL — ABNORMAL HIGH (ref 0.3–1.2)
Total Protein: 7.5 g/dL (ref 6.0–8.3)

## 2012-05-27 LAB — HEMOGLOBIN A1C: Hgb A1c MFr Bld: 6.2 % (ref 4.6–6.5)

## 2012-05-27 MED ORDER — ZINC OXIDE 20 % EX OINT: 1.0000 "application " | TOPICAL_OINTMENT | Freq: Two times a day (BID) | CUTANEOUS | Status: DC

## 2012-05-27 MED ORDER — METOPROLOL TARTRATE 50 MG PO TABS: 25.0000 mg | ORAL_TABLET | Freq: Two times a day (BID) | ORAL | Status: DC

## 2012-05-27 NOTE — Progress Notes (Signed)
Subjective:    Patient ID: [name] [name] [facility], female    DOB: 11/26/1931, 78 y.o.   MRN: [id number]  HPI Mrs. [name]  is here for annual Medicare wellness examination and management of other chronic and acute problems.   The risk factors are reflected in the social history.  The roster of all physicians providing medical care to patient - is listed in the Snapshot section of the chart.  Activities of daily living:  The patient is 100% inedpendent in all ADLs: dressing, toileting, feeding as well as independent mobility  Home safety : The patient has smoke detectors in the home. They wear seatbelts.Fall - home isd fall safe.  firearms are present in the home, kept in a safe fashion . There is no violence in the home.   There is no risks for hepatitis, STDs or HIV. There is no   history of blood transfusion. They have no travel history to infectious disease endemic areas of the world.  The patient has not seen their dentist in the last six month. They have seen their eye doctor in the last year. They deny any hearing difficulty and have not had audiologic testing in the last year.  They do not  have excessive sun exposure. Discussed the need for sun protection: hats, long sleeves and use of sunscreen if there is significant sun exposure.   Diet: the importance of a healthy diet is discussed. They do have a healthy diet.  The patient has a regular exercise program: walking , 30 min duration, 5 per week.  The benefits of regular aerobic exercise were discussed.  Depression screen: there are no signs or vegative symptoms of depression- irritability, change in appetite, anhedonia, sadness/tearfullness.  Cognitive assessment: the patient manages all their financial and personal affairs and is actively engaged.   The following portions of the patient's history were reviewed and updated as appropriate: allergies, current medications, past family history, past medical history,  past surgical history,  past social history  and problem list.  Vision, hearing, body mass index were assessed and reviewed.   During the course of the visit the patient was educated and counseled about appropriate screening and preventive services including : fall prevention , diabetes screening, nutrition counseling, colorectal cancer screening, and recommended immunizations.  Past Medical History  Diagnosis Date  . Aftercare for healing traumatic fracture of arm, unspecified   . Malignant melanoma of skin of trunk, except scrotum   . Rosacea   . Sprain of lumbar region   . Sciatica     right  . Other postprocedural status   . Hyperlipidemia   . H/O: hysterectomy   . S/P cholecystectomy   . H/O: hemorrhoidectomy   . Hypertension   . A-fib     paroxysmal   Past Surgical History  Procedure Date  . Catheter ablation of atrail flutter   . Hernia repair   . Bladder suspension   . Abdominal hysterectomy   . Cholecystectomy   . Hemorrhoid surgery    No family history on file. History   Social History  . Marital Status: Married    Spouse Name: N/A    Number of Children: 6  . Years of Education: 10   Occupational History  . telephone operator     retired   Social History Main Topics  . Smoking status: Never Smoker   . Smokeless tobacco: Never Used  . Alcohol Use: No  . Drug Use: No  . Sexually Active: No  Other Topics Concern  . Not on file   Social History Narrative   10th grade. Married '51.4 sons- '52, '54, '58, '70; 2 daughters- '56, '64 older girl died of ovarian cancer.11 grandchildren, 2 great grands. 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    Current Outpatient Prescriptions on File Prior to Visit  Medication Sig Dispense Refill  . acetaminophen (TYLENOL) 500 MG tablet Take 1,000 mg by mouth every 6 (six) hours as needed. For pain      . amiodarone (PACERONE) 200 MG tablet Take 200 mg by mouth daily.      Marland Kitchen  atorvastatin (LIPITOR) 20 MG tablet Take 20 mg by mouth daily.      . Calcium Carbonate-Vitamin D (CALCIUM-VITAMIN D) 500-200 MG-UNIT per tablet Take 1 tablet by mouth daily.        . hydrochlorothiazide (HYDRODIURIL) 25 MG tablet Take 25 mg by mouth daily.      . metoprolol (LOPRESSOR) 50 MG tablet Take 25 mg by mouth 2 (two) times daily.       . Multiple Vitamins-Minerals (MULTIVITAMIN WITH MINERALS) tablet Take 1 tablet by mouth daily.        Marland Kitchen warfarin (COUMADIN) 2.5 MG tablet Take 2.5 mg by mouth daily. Take 2 tablets on Wed. Take 1 tablet all other days            Tramadol 50 mg q 8 prn       Alprazolam 0.5 mg prn    Review of Systems Constitutional:  Negative for fever, chills, activity change and unexpected weight change.  HEENT:  Negative for hearing loss, ear pain, congestion, neck stiffness and postnasal drip. Negative for sore throat or swallowing problems. Negative for dental complaints.   Eyes: Negative for vision loss or change in visual acuity. She does have floaters Respiratory: Negative for chest tightness and wheezing. Negative for DOE.   Cardiovascular: Negative for chest pain or palpitations. No decreased exercise tolerance Gastrointestinal: No change in bowel habit. No bloating or gas. No reflux or indigestion Genitourinary: Negative for urgency, frequency, flank pain and difficulty urinating.  Musculoskeletal: Negative for myalgias, back pain, arthralgias and gait problem.  Neurological: Negative for dizziness, tremors, weakness and headaches.  Hematological: Negative for adenopathy.  Psychiatric/Behavioral: Negative for behavioral problems and dysphoric mood.       Objective:   Physical Exam Filed Vitals:   05/27/12 0904  BP: 142/82  Pulse: 72  Temp: 97.3 F (36.3 C)  Resp: 16   Wt Readings from Last 3 Encounters:  05/27/12 195 lb (88.451 kg)  11/07/11 195 lb 1.9 oz (88.506 kg)  07/10/11 194 lb (87.998 kg)    Gen'l: well nourished, well developed  white Woman in no distress HEENT - [place]/AT- with resolving bruise in the left periorbital region, EACs/TMs normal, oropharynx with full upper denture and few remaining native dentition mandible, no buccal or palatal lesions, posterior pharynx clear, mucous membranes moist. Small irritation on the frenulum. C&S clear, PERRLA, fundi - normal Neck - supple, no thyromegaly Nodes- negative submental, cervical, supraclavicular regions. Fullness in submandibular region Chest - no deformity, no CVAT Lungs - cleat without rales, wheezes. No increased work of breathing Breast - deferred to recent mammogram Cardiovascular - regular rate and rhythm, quiet precordium, no murmurs, rubs or gallops, 2+ radial, DP and PT pulse. 2+ pitting edema both ankles Abdomen - BS+ x 4, no HSM, no guarding or rebound or tenderness, obese Pelvic -  deferred  Rectal - deferred  Extremities - no clubbing, cyanosis, edema or deformity. Fading bruise left shoulder, normal ROM left shoulder, elbow wrist.  Neuro - A&O x 3, CN II-XII normal, motor strength normal and equal, DTRs 2+ and symmetrical biceps, radial, and patellar tendons. Cerebellar - no tremor, no rigidity, fluid movement and normal gait. Derm - Head, neck, back, abdomen and extremities without suspicious lesions  Lab Results  Component Value Date   WBC 5.6 08/27/2008   HGB 13.8 08/27/2008   HCT 40.8 08/27/2008   PLT 296 08/27/2008   GLUCOSE 148* 05/27/2012   CHOL 180 05/27/2012   TRIG 72.0 05/27/2012   HDL 89.00 05/27/2012   LDLCALC 77 05/27/2012        ALT 24 05/27/2012   AST 25 05/27/2012        NA 140 05/27/2012   K 4.6 05/27/2012   CL 103 05/27/2012   CREATININE 0.8 05/27/2012   BUN 21 05/27/2012   CO2 27 05/27/2012   TSH 2.71 07/10/2011   INR 3.1 05/09/2012   HGBA1C 6.2 05/27/2012          Assessment & Plan:

## 2012-05-28 ENCOUNTER — Telehealth: Payer: Self-pay | Admitting: *Deleted

## 2012-05-28 DIAGNOSIS — Z0000 Encounter for general adult medical examination without abnormal findings: Secondary | ICD-10-CM | POA: Insufficient documentation

## 2012-05-28 NOTE — Assessment & Plan Note (Signed)
Lab reveals excellent control with LDL better than goal of 80 or less and HDL greater than 40. Liver functions are normal.  Plan -  Continue present dose of Lipitor.

## 2012-05-28 NOTE — Assessment & Plan Note (Signed)
Holding a sinus rhythm on exam today. NO report of tachycardia, chest pain, near syncope. Tolerating medications well.  Plan - continue current regimen: amiodarone, coumadin  Follow up with cardiology as instructed.

## 2012-05-28 NOTE — Assessment & Plan Note (Signed)
BP Readings from Last 3 Encounters:  05/27/12 142/82  05/07/12 161/68  11/07/11 128/78   Borderline control. Goal is to have SBP of 140 or less most of the time.  Plan -  Monitor BP at home and call if not meeting goal so that medications can be adjusted.

## 2012-05-28 NOTE — Assessment & Plan Note (Addendum)
Interval medical history is stable with no major illness, injury or surgery. Physical exam, sans breast and pelvic, is remarkable only for increased weight. Lab results are in normal limits, including mildly elevated serum glucose but a normal range A1C @ 6.2% She may be due for colonoscopy - checking records. Immunizations are brought up to date and she will check her coverage for shingles vaccines. She is current with mammography.  In summary - a very nice woman who appears to be medically stable. She will continue to walk, work on [employer] with smart food choices and portion size control. She will return in 6 months, sooner as needed.

## 2012-05-28 NOTE — Telephone Encounter (Signed)
PATIENT REFILL ON CREAM CALLED TO [facility]

## 2012-06-03 ENCOUNTER — Encounter: Payer: Self-pay | Admitting: Internal Medicine

## 2012-06-06 ENCOUNTER — Ambulatory Visit (INDEPENDENT_AMBULATORY_CARE_PROVIDER_SITE_OTHER): Payer: Medicare Other | Admitting: *Deleted

## 2012-06-06 DIAGNOSIS — I4891 Unspecified atrial fibrillation: Secondary | ICD-10-CM | POA: Diagnosis not present

## 2012-06-06 DIAGNOSIS — I4892 Unspecified atrial flutter: Secondary | ICD-10-CM | POA: Diagnosis not present

## 2012-06-06 LAB — POCT INR: INR: 3

## 2012-06-15 ENCOUNTER — Other Ambulatory Visit: Payer: Self-pay | Admitting: Internal Medicine

## 2012-07-04 ENCOUNTER — Ambulatory Visit (INDEPENDENT_AMBULATORY_CARE_PROVIDER_SITE_OTHER): Payer: Medicare Other | Admitting: Pharmacist

## 2012-07-04 DIAGNOSIS — I4891 Unspecified atrial fibrillation: Secondary | ICD-10-CM | POA: Diagnosis not present

## 2012-07-04 DIAGNOSIS — I4892 Unspecified atrial flutter: Secondary | ICD-10-CM

## 2012-07-04 LAB — POCT INR: INR: 2.9

## 2012-07-09 ENCOUNTER — Encounter: Payer: Self-pay | Admitting: *Deleted

## 2012-07-09 ENCOUNTER — Telehealth: Payer: Self-pay | Admitting: *Deleted

## 2012-07-09 ENCOUNTER — Encounter: Payer: Self-pay | Admitting: Internal Medicine

## 2012-07-09 NOTE — Telephone Encounter (Signed)
Message copied by Elnora Morrison on Tue Jul 09, 2012  8:35 AM ------      Message from: Illene Regulus E      Created: Mon May 27, 2012  9:36 AM       pleaswe pull old chart to locate colonoscopy report

## 2012-07-15 ENCOUNTER — Other Ambulatory Visit: Payer: Self-pay | Admitting: Internal Medicine

## 2012-07-17 ENCOUNTER — Encounter: Payer: Self-pay | Admitting: Internal Medicine

## 2012-07-31 ENCOUNTER — Encounter: Payer: Self-pay | Admitting: Pharmacist

## 2012-08-01 ENCOUNTER — Ambulatory Visit (INDEPENDENT_AMBULATORY_CARE_PROVIDER_SITE_OTHER): Payer: Medicare Other | Admitting: Pharmacist

## 2012-08-01 DIAGNOSIS — I4891 Unspecified atrial fibrillation: Secondary | ICD-10-CM

## 2012-08-01 DIAGNOSIS — I4892 Unspecified atrial flutter: Secondary | ICD-10-CM

## 2012-08-01 LAB — POCT INR: INR: 3.1

## 2012-08-21 ENCOUNTER — Ambulatory Visit (INDEPENDENT_AMBULATORY_CARE_PROVIDER_SITE_OTHER): Payer: Medicare Other | Admitting: General Practice

## 2012-08-21 DIAGNOSIS — I4892 Unspecified atrial flutter: Secondary | ICD-10-CM | POA: Diagnosis not present

## 2012-08-21 DIAGNOSIS — I4891 Unspecified atrial fibrillation: Secondary | ICD-10-CM | POA: Diagnosis not present

## 2012-08-21 LAB — POCT INR: INR: 2.3

## 2012-09-18 ENCOUNTER — Ambulatory Visit (INDEPENDENT_AMBULATORY_CARE_PROVIDER_SITE_OTHER): Payer: Medicare Other | Admitting: General Practice

## 2012-09-18 DIAGNOSIS — I4892 Unspecified atrial flutter: Secondary | ICD-10-CM | POA: Diagnosis not present

## 2012-09-18 DIAGNOSIS — I4891 Unspecified atrial fibrillation: Secondary | ICD-10-CM

## 2012-09-18 LAB — POCT INR: INR: 3

## 2012-10-04 ENCOUNTER — Encounter: Payer: Self-pay | Admitting: Internal Medicine

## 2012-10-16 ENCOUNTER — Ambulatory Visit (INDEPENDENT_AMBULATORY_CARE_PROVIDER_SITE_OTHER): Payer: Medicare Other | Admitting: General Practice

## 2012-10-16 DIAGNOSIS — I4891 Unspecified atrial fibrillation: Secondary | ICD-10-CM

## 2012-10-16 DIAGNOSIS — I4892 Unspecified atrial flutter: Secondary | ICD-10-CM | POA: Diagnosis not present

## 2012-10-16 LAB — POCT INR: INR: 2.2

## 2012-10-21 ENCOUNTER — Other Ambulatory Visit: Payer: Self-pay | Admitting: *Deleted

## 2012-10-21 MED ORDER — ATORVASTATIN CALCIUM 20 MG PO TABS: 20.0000 mg | ORAL_TABLET | Freq: Every day | ORAL | Status: DC

## 2012-11-13 ENCOUNTER — Ambulatory Visit (INDEPENDENT_AMBULATORY_CARE_PROVIDER_SITE_OTHER): Payer: Medicare Other | Admitting: Internal Medicine

## 2012-11-13 ENCOUNTER — Encounter: Payer: Self-pay | Admitting: Internal Medicine

## 2012-11-13 ENCOUNTER — Ambulatory Visit (INDEPENDENT_AMBULATORY_CARE_PROVIDER_SITE_OTHER): Payer: Medicare Other | Admitting: *Deleted

## 2012-11-13 VITALS — BP 167/74 | HR 59 | Ht 64.0 in | Wt 194.4 lb

## 2012-11-13 DIAGNOSIS — I4892 Unspecified atrial flutter: Secondary | ICD-10-CM

## 2012-11-13 DIAGNOSIS — I4891 Unspecified atrial fibrillation: Secondary | ICD-10-CM

## 2012-11-13 DIAGNOSIS — I498 Other specified cardiac arrhythmias: Secondary | ICD-10-CM | POA: Diagnosis not present

## 2012-11-13 LAB — POCT INR: INR: 1.6

## 2012-11-13 NOTE — Assessment & Plan Note (Signed)
The patient is maintaining sinus rhythm very nicely on low-dose amiodarone. She takes 200 mg daily Monday through Thursday and 100 mg daily Friday through Sunday. No change in medical therapy at this time.

## 2012-11-13 NOTE — Progress Notes (Signed)
HPI [name] [name] returns today for followup. She is a very pleasant 78 year old woman with paroxysmal atrial fibrillation, hypertension, dyslipidemia, and chronic anticoagulation with Coumadin. In the interim she has done well. She is maintaining sinus rhythm on progressively lower doses of amiodarone. She currently takes just over a gram per week. She denies chest pain, shortness of breath, or palpitations. She has been unable to lose weight. Allergies  Allergen Reactions  . Oxycodone-Acetaminophen Nausea Only     Current Outpatient Prescriptions  Medication Sig Dispense Refill  . acetaminophen (TYLENOL) 500 MG tablet Take 1,000 mg by mouth every 6 (six) hours as needed. For pain      . ALPRAZolam (XANAX) 0.25 MG tablet Take 0.25 mg by mouth every 6 (six) hours as needed.      Marland Kitchen amiodarone (PACERONE) 200 MG tablet Take 200 mg by mouth daily.      Marland Kitchen atorvastatin (LIPITOR) 20 MG tablet Take 20 mg by mouth daily.      Marland Kitchen atorvastatin (LIPITOR) 20 MG tablet Take 1 tablet (20 mg total) by mouth at bedtime.  90 tablet  3  . Calcium Carbonate-Vitamin D (CALCIUM-VITAMIN D) 500-200 MG-UNIT per tablet Take 1 tablet by mouth daily.        . hydrochlorothiazide (HYDRODIURIL) 25 MG tablet Take 25 mg by mouth daily.      . hydrocortisone cream-nystatin cream-zinc oxide Apply 1 application topically 2 (two) times daily. Compound nystatint 4 MU/zo  (greersgoo)  240 g  240 g  2  . metoprolol (LOPRESSOR) 50 MG tablet Take 0.5 tablets (25 mg total) by mouth 2 (two) times daily.  90 tablet  3  . Multiple Vitamins-Minerals (MULTIVITAMIN WITH MINERALS) tablet Take 1 tablet by mouth daily.        . traMADol (ULTRAM) 50 MG tablet Take 50 mg by mouth every 6 (six) hours as needed.      . warfarin (COUMADIN) 2.5 MG tablet Take 2.5 mg by mouth daily. Take 2 tablets on Wed. Take 1 tablet all other days      . warfarin (COUMADIN) 2.5 MG tablet TAKE 1 TABLET (2.5 MG TOTAL) BY MOUTH DAILY.  116 tablet  6     Past Medical  History  Diagnosis Date  . Aftercare for healing traumatic fracture of arm, unspecified   . Malignant melanoma of skin of trunk, except scrotum   . Rosacea   . Sprain of lumbar region   . Sciatica     right  . Other postprocedural status   . Hyperlipidemia   . H/O: hysterectomy   . S/P cholecystectomy   . H/O: hemorrhoidectomy   . Hypertension   . A-fib     paroxysmal    ROS:   All systems reviewed and negative except as noted in the HPI.   Past Surgical History  Procedure Date  . Catheter ablation of atrail flutter   . Hernia repair   . Bladder suspension   . Abdominal hysterectomy   . Cholecystectomy   . Hemorrhoid surgery      No family history on file.   History   Social History  . Marital Status: Married    Spouse Name: N/A    Number of Children: 6  . Years of Education: 10   Occupational History  . telephone operator     retired   Social History Main Topics  . Smoking status: Never Smoker   . Smokeless tobacco: Never Used  . Alcohol Use: No  . Drug  Use: No  . Sexually Active: No   Other Topics Concern  . Not on file   Social History Narrative   10th grade. Married '51.4 sons- '52, '54, '58, '70; 2 daughters- '56, '64 older girl died of ovarian cancer.11 grandchildren, 2 great grands. 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      BP 167/74  Pulse 59  Ht 5\' 4"  (1.626 m)  Wt 194 lb 6.4 oz (88.179 kg)  BMI 33.37 kg/m2  Physical Exam:  Well appearing 78 year old woman, NAD HEENT: Unremarkable Neck:  7 cm JVD, no thyromegally Lungs:  Clear with no wheezes, rales, or rhonchi. HEART:  Regular rate rhythm, no murmurs, no rubs, no clicks Abd:  soft, positive bowel sounds, no organomegally, no rebound, no guarding Ext:  2 plus pulses, no edema, no cyanosis, no clubbing Skin:  No rashes no nodules Neuro:  CN II through XII intact, motor grossly intact  ECG - normal sinus  rhythm   Assess/Plan:

## 2012-11-13 NOTE — Assessment & Plan Note (Signed)
Her heart rate appears to be well-controlled. No indication for permanent pacemaker at this time.

## 2012-11-29 ENCOUNTER — Ambulatory Visit (INDEPENDENT_AMBULATORY_CARE_PROVIDER_SITE_OTHER): Payer: Medicare Other | Admitting: General Practice

## 2012-11-29 DIAGNOSIS — I4892 Unspecified atrial flutter: Secondary | ICD-10-CM

## 2012-11-29 DIAGNOSIS — I4891 Unspecified atrial fibrillation: Secondary | ICD-10-CM | POA: Diagnosis not present

## 2012-11-29 DIAGNOSIS — Z1231 Encounter for screening mammogram for malignant neoplasm of breast: Secondary | ICD-10-CM | POA: Diagnosis not present

## 2012-11-29 LAB — POCT INR: INR: 2.4

## 2013-01-01 ENCOUNTER — Ambulatory Visit (INDEPENDENT_AMBULATORY_CARE_PROVIDER_SITE_OTHER): Payer: Medicare Other | Admitting: General Practice

## 2013-01-01 DIAGNOSIS — I4892 Unspecified atrial flutter: Secondary | ICD-10-CM

## 2013-01-01 DIAGNOSIS — I4891 Unspecified atrial fibrillation: Secondary | ICD-10-CM

## 2013-01-01 LAB — POCT INR: INR: 1.7

## 2013-01-06 ENCOUNTER — Encounter: Payer: Self-pay | Admitting: Internal Medicine

## 2013-01-29 ENCOUNTER — Ambulatory Visit (INDEPENDENT_AMBULATORY_CARE_PROVIDER_SITE_OTHER): Payer: Medicare Other | Admitting: General Practice

## 2013-01-29 DIAGNOSIS — I4891 Unspecified atrial fibrillation: Secondary | ICD-10-CM

## 2013-01-29 DIAGNOSIS — I4892 Unspecified atrial flutter: Secondary | ICD-10-CM

## 2013-01-29 LAB — POCT INR: INR: 3.1

## 2013-02-08 ENCOUNTER — Other Ambulatory Visit: Payer: Self-pay | Admitting: Internal Medicine

## 2013-02-26 ENCOUNTER — Ambulatory Visit (INDEPENDENT_AMBULATORY_CARE_PROVIDER_SITE_OTHER): Payer: Medicare Other | Admitting: General Practice

## 2013-02-26 DIAGNOSIS — I4891 Unspecified atrial fibrillation: Secondary | ICD-10-CM

## 2013-02-26 DIAGNOSIS — I4892 Unspecified atrial flutter: Secondary | ICD-10-CM | POA: Diagnosis not present

## 2013-02-26 LAB — POCT INR: INR: 1.8

## 2013-03-20 ENCOUNTER — Encounter: Payer: Self-pay | Admitting: Internal Medicine

## 2013-03-25 ENCOUNTER — Other Ambulatory Visit: Payer: Self-pay | Admitting: Internal Medicine

## 2013-03-26 ENCOUNTER — Ambulatory Visit (INDEPENDENT_AMBULATORY_CARE_PROVIDER_SITE_OTHER): Payer: Medicare Other | Admitting: General Practice

## 2013-03-26 DIAGNOSIS — I4892 Unspecified atrial flutter: Secondary | ICD-10-CM

## 2013-03-26 DIAGNOSIS — I4891 Unspecified atrial fibrillation: Secondary | ICD-10-CM

## 2013-03-26 LAB — POCT INR: INR: 1.7

## 2013-04-17 ENCOUNTER — Other Ambulatory Visit: Payer: Self-pay | Admitting: Internal Medicine

## 2013-04-23 ENCOUNTER — Ambulatory Visit (INDEPENDENT_AMBULATORY_CARE_PROVIDER_SITE_OTHER): Payer: Medicare Other | Admitting: Family Medicine

## 2013-04-23 DIAGNOSIS — I4891 Unspecified atrial fibrillation: Secondary | ICD-10-CM

## 2013-04-23 DIAGNOSIS — I4892 Unspecified atrial flutter: Secondary | ICD-10-CM

## 2013-04-23 LAB — POCT INR: INR: 1.7

## 2013-05-01 ENCOUNTER — Other Ambulatory Visit: Payer: Self-pay | Admitting: Internal Medicine

## 2013-05-07 ENCOUNTER — Ambulatory Visit (INDEPENDENT_AMBULATORY_CARE_PROVIDER_SITE_OTHER): Payer: Medicare Other | Admitting: Family Medicine

## 2013-05-07 DIAGNOSIS — I4891 Unspecified atrial fibrillation: Secondary | ICD-10-CM

## 2013-05-07 DIAGNOSIS — I4892 Unspecified atrial flutter: Secondary | ICD-10-CM

## 2013-05-07 LAB — POCT INR: INR: 2.4

## 2013-05-14 ENCOUNTER — Other Ambulatory Visit: Payer: Self-pay | Admitting: Internal Medicine

## 2013-05-22 ENCOUNTER — Ambulatory Visit (INDEPENDENT_AMBULATORY_CARE_PROVIDER_SITE_OTHER): Payer: Medicare Other | Admitting: Internal Medicine

## 2013-05-22 ENCOUNTER — Encounter: Payer: Self-pay | Admitting: Internal Medicine

## 2013-05-22 ENCOUNTER — Ambulatory Visit: Payer: Medicare Other | Admitting: Internal Medicine

## 2013-05-22 VITALS — BP 138/80 | HR 56 | Temp 98.6°F | Wt 199.0 lb

## 2013-05-22 DIAGNOSIS — H6120 Impacted cerumen, unspecified ear: Secondary | ICD-10-CM

## 2013-05-22 DIAGNOSIS — H6122 Impacted cerumen, left ear: Secondary | ICD-10-CM

## 2013-05-22 NOTE — Progress Notes (Signed)
Subjective:    Patient ID: [name] [name], female    DOB: 04-26-1931, 79 y.o.   MRN: [id number]  HPI  Pt presents to the clinic today with c/o left ear pain. She also c/o impaired hearing in that earn. She thinks there is wax buildup in it. She has tried to get it out with a kleenex. It did not help. She has had a problem wax buildup in the past. She denies fever, chills or body aches. Additionally, she also request a refill of her xanax today.  Review of Systems      Past Medical History  Diagnosis Date  . Aftercare for healing traumatic fracture of arm, unspecified   . Malignant melanoma of skin of trunk, except scrotum   . Rosacea   . Sprain of lumbar region   . Sciatica     right  . Other postprocedural status(V45.89)   . Hyperlipidemia   . H/O: hysterectomy   . S/P cholecystectomy   . H/O: hemorrhoidectomy   . Hypertension   . A-fib     paroxysmal    Current Outpatient Prescriptions  Medication Sig Dispense Refill  . acetaminophen (TYLENOL) 500 MG tablet Take 1,000 mg by mouth every 6 (six) hours as needed. For pain      . ALPRAZolam (XANAX) 0.25 MG tablet Take 0.25 mg by mouth every 6 (six) hours as needed.      Marland Kitchen amiodarone (PACERONE) 200 MG tablet Take 200 mg by mouth daily.      Marland Kitchen amiodarone (PACERONE) 200 MG tablet TAKE 1 TABLET (200 MG TOTAL) BY MOUTH DAILY. **REFILL 12/20/11**  90 tablet  3  . atorvastatin (LIPITOR) 20 MG tablet *REFILL 8/21* TAKE 1 TABLET AT BEDTIME  30 tablet  6  . atorvastatin (LIPITOR) 20 MG tablet *REFILL 8/21* TAKE 1 TABLET AT BEDTIME  30 tablet  5  . Calcium Carbonate-Vitamin D (CALCIUM-VITAMIN D) 500-200 MG-UNIT per tablet Take 1 tablet by mouth daily.        . hydrochlorothiazide (HYDRODIURIL) 25 MG tablet Take 25 mg by mouth daily.      . hydrochlorothiazide (HYDRODIURIL) 25 MG tablet TAKE 1 TABLET (25 MG TOTAL) BY MOUTH DAILY.  90 tablet  0  . hydrocortisone cream-nystatin cream-zinc oxide Apply 1 application topically 2 (two) times  daily. Compound nystatint 4 MU/zo  (greersgoo)  240 g  240 g  2  . metoprolol (LOPRESSOR) 50 MG tablet TAKE 0.5 TABLETS (25 MG TOTAL) BY MOUTH 2 (TWO) TIMES DAILY.  90 tablet  3  . Multiple Vitamins-Minerals (MULTIVITAMIN WITH MINERALS) tablet Take 1 tablet by mouth daily.        . traMADol (ULTRAM) 50 MG tablet Take 50 mg by mouth every 6 (six) hours as needed.      . warfarin (COUMADIN) 2.5 MG tablet Take 2.5 mg by mouth daily. Take 2 tablets on Wed. Take 1 tablet all other days      . warfarin (COUMADIN) 2.5 MG tablet TAKE 1 TABLET (2.5 MG TOTAL) BY MOUTH DAILY.  116 tablet  6   No current facility-administered medications for this visit.    Allergies  Allergen Reactions  . Oxycodone-Acetaminophen Nausea Only    History reviewed. No pertinent family history.  History   Social History  . Marital Status: Married    Spouse Name: N/A    Number of Children: 6  . Years of Education: 10   Occupational History  . telephone operator     retired  Social History Main Topics  . Smoking status: Never Smoker   . Smokeless tobacco: Never Used  . Alcohol Use: No  . Drug Use: No  . Sexually Active: No   Other Topics Concern  . Not on file   Social History Narrative   10th grade. Married '51.4 sons- '52, '54, '58, '70; 2 daughters- '56, '64 older girl died of ovarian cancer.11 grandchildren, 2 great grands. 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      Constitutional: Denies fever, malaise, fatigue, headache or abrupt weight changes.  HEENT: Pt reports left ear pain. Denies eye pain, eye redness,  ringing in the ears, wax buildup, runny nose, nasal congestion, bloody nose, or sore throat. .   No other specific complaints in a complete review of systems (except as listed in HPI above).  Objective:   Physical Exam   BP 138/80  Pulse 56  Temp(Src) 98.6 F (37 C) (Oral)  Wt 199 lb (90.266 kg)  BMI 34.14  kg/m2  SpO2 95% Wt Readings from Last 3 Encounters:  05/22/13 199 lb (90.266 kg)  11/13/12 194 lb 6.4 oz (88.179 kg)  05/27/12 195 lb (88.451 kg)    General: Appears her stated age, well developed, well nourished in NAD. HEENT: Head: normal shape and size; Eyes: sclera white, no icterus, conjunctiva pink, PERRLA and EOMs intact;  Left Ear: impacted cerumen Right Ears: Tm's gray and intact, normal light reflex; Nose: mucosa pink and moist, septum midline; Throat/Mouth: Teeth present, mucosa pink and moist, no exudate, lesions or ulcerations noted.  Cardiovascular: Normal rate and rhythm. S1,S2 noted.  No murmur, rubs or gallops noted. No JVD or BLE edema. No carotid bruits noted. Pulmonary/Chest: Normal effort and positive vesicular breath sounds. No respiratory distress. No wheezes, rales or ronchi noted.    BMET    Component Value Date/Time   NA 140 05/27/2012 1018   K 4.6 05/27/2012 1018   CL 103 05/27/2012 1018   CO2 27 05/27/2012 1018   GLUCOSE 148* 05/27/2012 1018   BUN 21 05/27/2012 1018   CREATININE 0.8 05/27/2012 1018   CALCIUM 9.9 05/27/2012 1018   GFRNONAA 87 08/27/2008 1123   GFRAA 105 08/27/2008 1123    Lipid Panel     Component Value Date/Time   CHOL 180 05/27/2012 1018   TRIG 72.0 05/27/2012 1018   HDL 89.00 05/27/2012 1018   CHOLHDL 2 05/27/2012 1018   VLDL 14.4 05/27/2012 1018   LDLCALC 77 05/27/2012 1018    CBC    Component Value Date/Time   WBC 5.6 08/27/2008 1123   RBC 4.43 08/27/2008 1123   HGB 13.8 08/27/2008 1123   HCT 40.8 08/27/2008 1123   PLT 296 08/27/2008 1123   MCV 92.1 08/27/2008 1123   MCHC 33.7 08/27/2008 1123   RDW 12.9 08/27/2008 1123   MONOABS 0.4 08/27/2008 1123   EOSABS 0.2 08/27/2008 1123   BASOSABS 0.0 08/27/2008 1123    Hgb A1C Lab Results  Component Value Date   HGBA1C 6.2 05/27/2012        Assessment & Plan:   Cerumen impaction left ear:  Manually removed by CMA Can get Debrox OTC  RTC as needed

## 2013-05-22 NOTE — Patient Instructions (Signed)
Cerumen Impaction A cerumen impaction is when the wax in your ear forms a plug. This plug usually causes reduced hearing. Sometimes it also causes an earache or dizziness. Removing a cerumen impaction can be difficult and painful. The wax sticks to the ear canal. The canal is sensitive and bleeds easily. If you try to remove a heavy wax buildup with a cotton tipped swab, you may push it in further. Irrigation with water, suction, and small ear curettes may be used to clear out the wax. If the impaction is fixed to the skin in the ear canal, ear drops may be needed for a few days to loosen the wax. People who build up a lot of wax frequently can use ear wax removal products available in your local drugstore. SEEK MEDICAL CARE IF:  You develop an earache, increased hearing loss, or marked dizziness. Document Released: 12/21/2004 Document Revised: 02/05/2012 Document Reviewed: 02/10/2010 Exit[employer] Patient Information 2014 [employer].  

## 2013-06-04 ENCOUNTER — Ambulatory Visit (INDEPENDENT_AMBULATORY_CARE_PROVIDER_SITE_OTHER): Payer: Medicare Other | Admitting: General Practice

## 2013-06-04 DIAGNOSIS — I4891 Unspecified atrial fibrillation: Secondary | ICD-10-CM | POA: Diagnosis not present

## 2013-06-04 DIAGNOSIS — Z7901 Long term (current) use of anticoagulants: Secondary | ICD-10-CM | POA: Diagnosis not present

## 2013-06-04 DIAGNOSIS — I4892 Unspecified atrial flutter: Secondary | ICD-10-CM | POA: Diagnosis not present

## 2013-06-04 LAB — POCT INR: INR: 2.6

## 2013-06-28 ENCOUNTER — Other Ambulatory Visit: Payer: Self-pay | Admitting: Internal Medicine

## 2013-07-02 ENCOUNTER — Ambulatory Visit (INDEPENDENT_AMBULATORY_CARE_PROVIDER_SITE_OTHER): Payer: Medicare Other | Admitting: General Practice

## 2013-07-02 DIAGNOSIS — I4892 Unspecified atrial flutter: Secondary | ICD-10-CM

## 2013-07-02 DIAGNOSIS — I4891 Unspecified atrial fibrillation: Secondary | ICD-10-CM | POA: Diagnosis not present

## 2013-07-02 LAB — POCT INR: INR: 2.9

## 2013-07-27 ENCOUNTER — Other Ambulatory Visit: Payer: Self-pay | Admitting: Internal Medicine

## 2013-07-30 ENCOUNTER — Ambulatory Visit (INDEPENDENT_AMBULATORY_CARE_PROVIDER_SITE_OTHER): Payer: Medicare Other | Admitting: General Practice

## 2013-07-30 DIAGNOSIS — I4892 Unspecified atrial flutter: Secondary | ICD-10-CM | POA: Diagnosis not present

## 2013-07-30 DIAGNOSIS — I4891 Unspecified atrial fibrillation: Secondary | ICD-10-CM

## 2013-07-30 LAB — POCT INR: INR: 3

## 2013-08-27 ENCOUNTER — Ambulatory Visit (INDEPENDENT_AMBULATORY_CARE_PROVIDER_SITE_OTHER): Payer: Medicare Other | Admitting: General Practice

## 2013-08-27 DIAGNOSIS — I4891 Unspecified atrial fibrillation: Secondary | ICD-10-CM | POA: Diagnosis not present

## 2013-08-27 DIAGNOSIS — I4892 Unspecified atrial flutter: Secondary | ICD-10-CM | POA: Diagnosis not present

## 2013-08-27 LAB — POCT INR: INR: 3.9

## 2013-09-24 ENCOUNTER — Ambulatory Visit (INDEPENDENT_AMBULATORY_CARE_PROVIDER_SITE_OTHER): Payer: Medicare Other | Admitting: General Practice

## 2013-09-24 DIAGNOSIS — I4891 Unspecified atrial fibrillation: Secondary | ICD-10-CM

## 2013-09-24 DIAGNOSIS — I4892 Unspecified atrial flutter: Secondary | ICD-10-CM

## 2013-09-24 LAB — POCT INR: INR: 2.4

## 2013-09-30 ENCOUNTER — Other Ambulatory Visit: Payer: Self-pay | Admitting: Internal Medicine

## 2013-10-13 ENCOUNTER — Ambulatory Visit: Payer: Medicare Other | Admitting: Internal Medicine

## 2013-10-20 ENCOUNTER — Ambulatory Visit (INDEPENDENT_AMBULATORY_CARE_PROVIDER_SITE_OTHER): Payer: Medicare Other | Admitting: Internal Medicine

## 2013-10-20 ENCOUNTER — Encounter: Payer: Self-pay | Admitting: Internal Medicine

## 2013-10-20 ENCOUNTER — Ambulatory Visit (INDEPENDENT_AMBULATORY_CARE_PROVIDER_SITE_OTHER)
Admission: RE | Admit: 2013-10-20 | Discharge: 2013-10-20 | Disposition: A | Discharge status: Home / Self Care | Payer: Medicare Other | Source: Ambulatory Visit | Attending: Internal Medicine | Admitting: Internal Medicine

## 2013-10-20 ENCOUNTER — Other Ambulatory Visit (INDEPENDENT_AMBULATORY_CARE_PROVIDER_SITE_OTHER): Payer: Medicare Other

## 2013-10-20 VITALS — BP 130/70 | HR 62 | Temp 98.3°F | Wt 192.4 lb

## 2013-10-20 DIAGNOSIS — E785 Hyperlipidemia, unspecified: Secondary | ICD-10-CM | POA: Diagnosis not present

## 2013-10-20 DIAGNOSIS — M25559 Pain in unspecified hip: Secondary | ICD-10-CM | POA: Diagnosis not present

## 2013-10-20 DIAGNOSIS — I10 Essential (primary) hypertension: Secondary | ICD-10-CM | POA: Diagnosis not present

## 2013-10-20 DIAGNOSIS — Z0000 Encounter for general adult medical examination without abnormal findings: Secondary | ICD-10-CM

## 2013-10-20 DIAGNOSIS — G250 Essential tremor: Secondary | ICD-10-CM

## 2013-10-20 DIAGNOSIS — M25551 Pain in right hip: Secondary | ICD-10-CM

## 2013-10-20 DIAGNOSIS — Z23 Encounter for immunization: Secondary | ICD-10-CM | POA: Diagnosis not present

## 2013-10-20 DIAGNOSIS — R251 Tremor, unspecified: Secondary | ICD-10-CM

## 2013-10-20 DIAGNOSIS — L84 Corns and callosities: Secondary | ICD-10-CM

## 2013-10-20 LAB — HEPATIC FUNCTION PANEL
ALT: 30 U/L (ref 0–35)
AST: 30 U/L (ref 0–37)
Albumin: 4.1 g/dL (ref 3.5–5.2)
Alkaline Phosphatase: 80 U/L (ref 39–117)
Bilirubin, Direct: 0.3 mg/dL (ref 0.0–0.3)
Total Bilirubin: 1.7 mg/dL — ABNORMAL HIGH (ref 0.3–1.2)
Total Protein: 7.6 g/dL (ref 6.0–8.3)

## 2013-10-20 LAB — COMPREHENSIVE METABOLIC PANEL WITH GFR
ALT: 30 U/L (ref 0–35)
AST: 30 U/L (ref 0–37)
Albumin: 4.1 g/dL (ref 3.5–5.2)
Alkaline Phosphatase: 80 U/L (ref 39–117)
BUN: 21 mg/dL (ref 6–23)
CO2: 31 meq/L (ref 19–32)
Calcium: 9.7 mg/dL (ref 8.4–10.5)
Chloride: 103 meq/L (ref 96–112)
Creatinine, Ser: 0.7 mg/dL (ref 0.4–1.2)
GFR: 87 mL/min
Glucose, Bld: 101 mg/dL — ABNORMAL HIGH (ref 70–99)
Potassium: 4.2 meq/L (ref 3.5–5.1)
Sodium: 141 meq/L (ref 135–145)
Total Bilirubin: 1.7 mg/dL — ABNORMAL HIGH (ref 0.3–1.2)
Total Protein: 7.6 g/dL (ref 6.0–8.3)

## 2013-10-20 LAB — LIPID PANEL
Cholesterol: 186 mg/dL (ref 0–200)
HDL: 90.6 mg/dL (ref >39.00)
LDL Cholesterol: 84 mg/dL (ref 0–99)
Total CHOL/HDL Ratio: 2
Triglycerides: 58 mg/dL (ref 0.0–149.0)
VLDL: 11.6 mg/dL (ref 0.0–40.0)

## 2013-10-20 LAB — TSH: TSH: 2.42 u[IU]/mL (ref 0.35–5.50)

## 2013-10-20 MED ORDER — ALPRAZOLAM 0.25 MG PO TABS: 0.2500 mg | ORAL_TABLET | Freq: Four times a day (QID) | ORAL | Status: DC | PRN

## 2013-10-20 NOTE — Progress Notes (Signed)
Subjective:    Patient ID: [name] [name] [facility], female    DOB: 08-Jul-1931, 79 y.o.   MRN: [id number]  HPI [name] [name] has multiple complaints. 1. Right hip pain - it was worse on returning from the beach in august, pain in the groin but it is now better.  2. Callous - left foot and toe.  3. Had apthous ulcers - she had been eating a lot of candy and nuts. They have healed.  4. Tremor - resting tremor that is better with intention. She is slow to start her gait.  5. Weight management - did discuss available meds, Belviq, etc. She is afraid to try any new medication and will work on will power.   6. Anxiety - she does take xanax prn and needs a refill. Done  7. Somnolence - has excessive somnolence during the day. She does retire at 2-3 AM and gets up at 0800. Clearly is not getting enough sleep.   Past Medical History  Diagnosis Date  . Aftercare for healing traumatic fracture of arm, unspecified   . Malignant melanoma of skin of trunk, except scrotum   . Rosacea   . Sprain of lumbar region   . Sciatica     right  . Other postprocedural status(V45.89)   . Hyperlipidemia   . H/O: hysterectomy   . S/P cholecystectomy   . H/O: hemorrhoidectomy   . Hypertension   . A-fib     paroxysmal   Past Surgical History  Procedure Laterality Date  . Catheter ablation of atrail flutter    . Hernia repair    . Bladder suspension    . Abdominal hysterectomy    . Cholecystectomy    . Hemorrhoid surgery     History reviewed. No pertinent family history. History   Social History  . Marital Status: Married    Spouse Name: N/A    Number of Children: 6  . Years of Education: 10   Occupational History  . telephone operator     retired   Social History Main Topics  . Smoking status: Never Smoker   . Smokeless tobacco: Never Used  . Alcohol Use: No  . Drug Use: No  . Sexual Activity: No   Other Topics Concern  . Not on file   Social History Narrative   10th grade. Married '51.4  sons- '52, '54, '58, '70; 2 daughters- '56, '64 older girl died of ovarian cancer.11 grandchildren, 2 great grands. 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     Current Outpatient Prescriptions on File Prior to Visit  Medication Sig Dispense Refill  . acetaminophen (TYLENOL) 500 MG tablet Take 1,000 mg by mouth every 6 (six) hours as needed. For pain      . amiodarone (PACERONE) 200 MG tablet Take 200 mg by mouth daily.      Marland Kitchen atorvastatin (LIPITOR) 20 MG tablet *REFILL 8/21* TAKE 1 TABLET AT BEDTIME  30 tablet  5  . Calcium Carbonate-Vitamin D (CALCIUM-VITAMIN D) 500-200 MG-UNIT per tablet Take 1 tablet by mouth daily.        . hydrochlorothiazide (HYDRODIURIL) 25 MG tablet TAKE 1 TABLET (25 MG TOTAL) BY MOUTH DAILY.  90 tablet  3  . hydrocortisone cream-nystatin cream-zinc oxide Apply 1 application topically 2 (two) times daily. Compound nystatint 4 MU/zo  (greersgoo)  240 g  240 g  2  . metoprolol (LOPRESSOR) 50 MG tablet TAKE 0.5 TABLETS (25 MG TOTAL)  BY MOUTH 2 (TWO) TIMES DAILY.  90 tablet  3  . Multiple Vitamins-Minerals (MULTIVITAMIN WITH MINERALS) tablet Take 1 tablet by mouth daily.        . traMADol (ULTRAM) 50 MG tablet Take 50 mg by mouth every 6 (six) hours as needed.      . warfarin (COUMADIN) 2.5 MG tablet TAKE 1 TABLET (2.5 MG TOTAL) BY MOUTH DAILY.  116 tablet  2   No current facility-administered medications on file prior to visit.      Review of Systems System review is negative for any constitutional, cardiac, pulmonary, GI or neuro symptoms or complaints other than as described in the HPI.  Physical Exam: Filed Vitals:   10/20/13 1428  BP: 130/70  Pulse: 62  Temp: 98.3 F (36.8 C)   Gen'l - WNWD overweight woman in no distress HEENT - C&S clear Cor - 2+ Radial pulse Pulm - CTAP Neuro - low frequency resting tremor, resolves with intention, shuffling initial gait, minimal cog-wheeling  regidity at the elbow, minor facial twitch tremor. Derm - callus build up plantar aspect left MTP and 5th MTP joint. Callus build up right 5th MTP plantar aspect  A/P 1. Hip pain - may be degenerative joint disease although quiet right now Plan Hip x-rays so we know what we are dealing with. 2. Callus on both feet - has to do with weight bearing and arthritis. Trimmed the callus today which may help. For maintenance you can use a pumice stone to keep the callus from building back up. 3. Mouth ulcer - aphthous ulcers - usually related to sweets, nuts, acid foods. A common self limited problem. 4. Tremor - you do have a resting tremor and this may be early signs of a parkinson's like tremor. Plan  Referral to [facility] for further evaluation 5. Sleepiness - well, it sounds like you are not getting enough sleep. Try to retrain yourself to get 7-8 hours of sleep a night.   Bilateral hip x-ray: IMPRESSION:  There is no acute abnormality of the bony pelvis or of the hips. If  there are clinical concerns upper call fractures, follow-up CT  scanning is available upon request.    ,        Objective:   Physical Exam        Assessment & Plan:

## 2013-10-20 NOTE — Patient Instructions (Signed)
1. Hip pain - may be degenerative joint disease although quiet right now Plan Hip x-rays so we know what we are dealing with. 2. Callus on both feet - has to do with weight bearing and arthritis. Trimmed the callus today which may help. For maintenance you can use a pumice stone to keep the callus from building back up. 3. Mouth ulcer - aphthous ulcers - usually related to sweets, nuts, acid foods. A common self limited problem. 4. Tremor - you do have a resting tremor and this may be early signs of a parkinson's like tremor. Plan  Referral to [facility] for further evaluation 5. Sleepiness - well, it sounds like you are not getting enough sleep. Try to retrain yourself to get 7-8 hours of sleep a night.  Immunizations - will give flu shot today. Will need Prevnar 13 in 2-3 weeks - this will be a nurse visit.

## 2013-10-20 NOTE — Progress Notes (Signed)
Pre visit review using our clinic review tool, if applicable. No additional management support is needed unless otherwise documented below in the visit note. 

## 2013-10-21 DIAGNOSIS — R251 Tremor, unspecified: Secondary | ICD-10-CM | POA: Insufficient documentation

## 2013-10-21 NOTE — Assessment & Plan Note (Signed)
low frequency resting tremor, resolves with intention, shuffling initial gait, minimal cog-wheeling regidity at the elbow, minor facial twitch tremor.  Plan Refer to neurology to r/o Parkinsonian tremor or need for medical therapy.

## 2013-10-21 NOTE — Assessment & Plan Note (Signed)
BP Readings from Last 3 Encounters:  10/20/13 130/70  05/22/13 138/80  11/13/12 167/74   Good control on present medications

## 2013-10-21 NOTE — Assessment & Plan Note (Signed)
Callus trimmed from great toe and base of 5th toe left foot and base of 5th toe right foot. To use pumice stone for control of callus growth.

## 2013-10-21 NOTE — Assessment & Plan Note (Signed)
Immunizations - will give flu shot today. Will need Prevnar 13 in 2-3 weeks - this will be a nurse visit.

## 2013-10-24 ENCOUNTER — Encounter: Payer: Self-pay | Admitting: Internal Medicine

## 2013-10-28 ENCOUNTER — Ambulatory Visit (INDEPENDENT_AMBULATORY_CARE_PROVIDER_SITE_OTHER): Payer: Medicare Other | Admitting: Neurology

## 2013-10-28 ENCOUNTER — Encounter: Payer: Self-pay | Admitting: Neurology

## 2013-10-28 VITALS — BP 150/82 | HR 64 | Temp 97.7°F | Resp 14 | Ht 65.0 in | Wt 193.6 lb

## 2013-10-28 DIAGNOSIS — G250 Essential tremor: Secondary | ICD-10-CM

## 2013-10-28 DIAGNOSIS — G251 Drug-induced tremor: Secondary | ICD-10-CM

## 2013-10-28 NOTE — Progress Notes (Signed)
[name] [name] [facility] was seen today in the movement disorders clinic for neurologic consultation at the request of Illene Regulus, MD.  The consultation is for the evaluation of tremor and to r/o PD.  The pt is accompanied by her husband and 2 sons, who supplement the hx.  The pt c/o tremor x 5 years.  She has tremor in both hands.  She has tremor when holding something with weight.  It has gotten worse with time.  She has jaw tremor.   Specific Symptoms:  Tremor: yes Voice: no changes Sleep: sleeps well  Vivid Dreams:  no  Acting out dreams:  no Wet Pillows: no Postural symptoms:  yes  Falls?  yes (fell in 2010 and had to have shoulder surgery after that.  Has falls about 2 times per year per sons - pt denies) Bradykinesia symptoms: slow movements, difficulty getting out of a chair and difficulty regaining balance Loss of smell:  no Loss of taste:  no Urinary Incontinence:  no (rare, and only if takes "fluid pill.") Difficulty Swallowing:  no Handwriting, micrographia: no Trouble with ADL's:  no  Trouble buttoning clothing: no Depression:  no Memory changes:  no Hallucinations:  no  visual distortions: no N/V:  no Lightheaded:  no  Syncope: no Diplopia:  no Dyskinesia:  no   PREVIOUS MEDICATIONS: none to date  ALLERGIES:   Allergies  Allergen Reactions  . Oxycodone-Acetaminophen Nausea Only    CURRENT MEDICATIONS:  Current Outpatient Prescriptions on File Prior to Visit  Medication Sig Dispense Refill  . acetaminophen (TYLENOL) 500 MG tablet Take 1,000 mg by mouth every 6 (six) hours as needed. For pain      . ALPRAZolam (XANAX) 0.25 MG tablet Take 1 tablet (0.25 mg total) by mouth every 6 (six) hours as needed.  30 tablet  2  . amiodarone (PACERONE) 200 MG tablet Take 200 mg by mouth daily.      Marland Kitchen atorvastatin (LIPITOR) 20 MG tablet *REFILL 8/21* TAKE 1 TABLET AT BEDTIME  30 tablet  5  . Calcium Carbonate-Vitamin D (CALCIUM-VITAMIN D) 500-200 MG-UNIT per tablet Take 1  tablet by mouth daily.        . hydrochlorothiazide (HYDRODIURIL) 25 MG tablet TAKE 1 TABLET (25 MG TOTAL) BY MOUTH DAILY.  90 tablet  3  . hydrocortisone cream-nystatin cream-zinc oxide Apply 1 application topically 2 (two) times daily. Compound nystatint 4 MU/zo  (greersgoo)  240 g  240 g  2  . metoprolol (LOPRESSOR) 50 MG tablet TAKE 0.5 TABLETS (25 MG TOTAL) BY MOUTH 2 (TWO) TIMES DAILY.  90 tablet  3  . Multiple Vitamins-Minerals (MULTIVITAMIN WITH MINERALS) tablet Take 1 tablet by mouth daily.        . traMADol (ULTRAM) 50 MG tablet Take 50 mg by mouth every 6 (six) hours as needed.      . warfarin (COUMADIN) 2.5 MG tablet TAKE 1 TABLET (2.5 MG TOTAL) BY MOUTH DAILY.  116 tablet  2   No current facility-administered medications on file prior to visit.    PAST MEDICAL HISTORY:   Past Medical History  Diagnosis Date  . Aftercare for healing traumatic fracture of arm, unspecified   . Malignant melanoma of skin of trunk, except scrotum   . Rosacea   . Sprain of lumbar region   . Sciatica     right  . Other postprocedural status(V45.89)   . Hyperlipidemia   . H/O: hysterectomy   . S/P cholecystectomy   .  H/O: hemorrhoidectomy   . Hypertension   . A-fib     paroxysmal    PAST SURGICAL HISTORY:   Past Surgical History  Procedure Laterality Date  . Catheter ablation of atrail flutter    . Hernia repair    . Bladder suspension    . Abdominal hysterectomy    . Cholecystectomy    . Hemorrhoid surgery      SOCIAL HISTORY:   History   Social History  . Marital Status: Married    Spouse Name: N/A    Number of Children: 6  . Years of Education: 10   Occupational History  . telephone operator     retired   Social History Main Topics  . Smoking status: Never Smoker   . Smokeless tobacco: Never Used  . Alcohol Use: No  . Drug Use: No  . Sexual Activity: No   Other Topics Concern  . Not on file   Social History Narrative   10th grade. Married '51.4 sons- '52, '54,  '58, '70; 2 daughters- '56, '64 older girl died of ovarian cancer.11 grandchildren, 2 great grands. 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     FAMILY HISTORY:   Family Status  Relation Status Death Age  . Mother Deceased 66    CHF  . Father Deceased 35    CHF  . Brother Alive   . Son Alive     4  . Daughter Deceased 7    Liver Cancer  . Daughter Alive     ROS:  A complete 10 system review of systems was obtained and was unremarkable apart from what is mentioned above.  PHYSICAL EXAMINATION:    VITALS:   Filed Vitals:   10/28/13 1237  BP: 150/82  Pulse: 64  Temp: 97.7 F (36.5 C)  Resp: 14  Height: 5\' 5"  (1.651 m)  Weight: 193 lb 9.6 oz (87.816 kg)    GEN:  The patient appears stated age and is in NAD. HEENT:  Normocephalic, atraumatic.  The mucous membranes are moist. The superficial temporal arteries are without ropiness or tenderness. CV:  RRR Lungs:  CTAB Neck/HEME:  There are no carotid bruits bilaterally.  Neurological examination:  Orientation: The patient is alert and oriented x3. Fund of knowledge is appropriate.  Recent and remote memory are intact.  Attention and concentration are normal.    Able to name objects and repeat phrases. Cranial nerves: There is good facial symmetry. Pupils are equal round and reactive to light bilaterally. Fundoscopic exam reveals clear margins bilaterally. Extraocular muscles are intact. The visual fields are full to confrontational testing. The speech is fluent and clear. Soft palate rises symmetrically and there is no tongue deviation. Hearing is intact to conversational tone. Sensation: Sensation is intact to light and pinprick throughout (facial, trunk, extremities). Vibration is decreased at the bilateral big toe. There is no extinction with double simultaneous stimulation. There is no sensory dermatomal level identified. Motor: Strength is 5/5 in the  bilateral upper and lower extremities.   Shoulder shrug is equal and symmetric.  There is no pronator drift. Deep tendon reflexes: Deep tendon reflexes are 3/4 at the bilateral biceps, triceps, brachioradialis, patella and trace at the bilateral achilles. Pre-patellar reflexes are easily obtained.  Plantar responses are downgoing bilaterally.  Movement examination: Tone: There is normal tone in the bilateral upper extremities.  The tone in the lower extremities is normal.  Abnormal movements: There is  tremor at rest, L greater than R.  It is worse with distraction procedures.  There is an intermittent jaw tremor Coordination:  There is no significant decremation with RAM's Gait and Station: The patient has minimal difficulty arising out of a deep-seated chair without the use of the hands. The patient's stride length is decreased and mildly antalgic.  She is slow and tenuous.      ASSESSMENT/PLAN:  1.  Tremor.    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  The onset of the tremor correlates to when she was started on amiodarone.  About 1/3 of pts on this med will experience tremor, which can be severe.  -I saw no evidence of PD  -I will send a letter to Dr. Ladona Ridgel, the pts cardiologist, letting him know what the pt and I discussed today.  I let the pt/family ask questions and I answered them to the best of my ability.  I will f/u with her prn.

## 2013-10-29 ENCOUNTER — Ambulatory Visit (INDEPENDENT_AMBULATORY_CARE_PROVIDER_SITE_OTHER): Payer: Medicare Other | Admitting: General Practice

## 2013-10-29 DIAGNOSIS — I4891 Unspecified atrial fibrillation: Secondary | ICD-10-CM | POA: Diagnosis not present

## 2013-10-29 DIAGNOSIS — I4892 Unspecified atrial flutter: Secondary | ICD-10-CM | POA: Diagnosis not present

## 2013-10-29 LAB — POCT INR: INR: 2.4

## 2013-10-29 NOTE — Progress Notes (Signed)
Pre-visit discussion using our clinic review tool. No additional management support is needed unless otherwise documented below in the visit note.  

## 2013-11-11 ENCOUNTER — Ambulatory Visit (INDEPENDENT_AMBULATORY_CARE_PROVIDER_SITE_OTHER): Payer: Medicare Other | Admitting: Internal Medicine

## 2013-11-11 ENCOUNTER — Encounter: Payer: Self-pay | Admitting: Internal Medicine

## 2013-11-11 VITALS — BP 132/64 | HR 55 | Ht 65.0 in | Wt 187.8 lb

## 2013-11-11 DIAGNOSIS — I4891 Unspecified atrial fibrillation: Secondary | ICD-10-CM | POA: Diagnosis not present

## 2013-11-11 DIAGNOSIS — I10 Essential (primary) hypertension: Secondary | ICD-10-CM

## 2013-11-11 NOTE — Assessment & Plan Note (Signed)
Despite obesity, her blood pressure is fairly well controlled. I've encouraged the patient to reduce her salt intake and maintain her current medical therapy.

## 2013-11-11 NOTE — Progress Notes (Signed)
HPI [name] [name] returns today for followup. She is a very pleasant 79 year old woman with paroxysmal atrial fibrillation, hypertension, dyslipidemia, and chronic anticoagulation with Coumadin. In the interim she has done well. She is maintaining sinus rhythm on progressively lower doses of amiodarone. She currently takes just over a gram per week. She denies chest pain, shortness of breath, or palpitations. She has lost 5 pounds. The patient has noted some tremors, and has been told by her ophthalmologist that she has a thin film over her eyes. Both of these have been thought to be due to amiodarone, despite a low dose. She denies cough, chest pain, or nausea or vomiting. Allergies  Allergen Reactions  . Oxycodone-Acetaminophen Nausea Only     Current Outpatient Prescriptions  Medication Sig Dispense Refill  . acetaminophen (TYLENOL) 500 MG tablet Take 1,000 mg by mouth every 6 (six) hours as needed. For pain      . ALPRAZolam (XANAX) 0.25 MG tablet Take 1 tablet (0.25 mg total) by mouth every 6 (six) hours as needed.  30 tablet  2  . amiodarone (PACERONE) 200 MG tablet Take 1 tablet once daily except for Friday, Saturday & Sunday only take 1/2 tablet      . atorvastatin (LIPITOR) 20 MG tablet TAKE 1 TABLET AT BEDTIME      . Calcium Carbonate-Vitamin D (CALCIUM-VITAMIN D) 500-200 MG-UNIT per tablet Take 1 tablet by mouth daily.        . hydrochlorothiazide (HYDRODIURIL) 25 MG tablet TAKE 1 TABLET (25 MG TOTAL) BY MOUTH DAILY.  90 tablet  3  . Hydrocortisone (GERHARDT'S BUTT CREAM) CREA Apply 1 application topically as needed for irritation.      . metoprolol (LOPRESSOR) 50 MG tablet TAKE 0.5 TABLETS (25 MG TOTAL) BY MOUTH 2 (TWO) TIMES DAILY.  90 tablet  3  . Multiple Vitamins-Minerals (MULTIVITAMIN WITH MINERALS) tablet Take 1 tablet by mouth daily.        . traMADol (ULTRAM) 50 MG tablet Take 50 mg by mouth every 6 (six) hours as needed.      . warfarin (COUMADIN) 2.5 MG tablet Take as directed  by the coumadin clinic       No current facility-administered medications for this visit.     Past Medical History  Diagnosis Date  . Aftercare for healing traumatic fracture of arm, unspecified   . Malignant melanoma of skin of trunk, except scrotum   . Rosacea   . Sprain of lumbar region   . Sciatica     right  . Other postprocedural status(V45.89)   . Hyperlipidemia   . H/O: hysterectomy   . S/P cholecystectomy   . H/O: hemorrhoidectomy   . Hypertension   . A-fib     paroxysmal    ROS:   All systems reviewed and negative except as noted in the HPI.   Past Surgical History  Procedure Laterality Date  . Catheter ablation of atrail flutter    . Hernia repair    . Bladder suspension    . Abdominal hysterectomy    . Cholecystectomy    . Hemorrhoid surgery       History reviewed. No pertinent family history.   History   Social History  . Marital Status: Married    Spouse Name: N/A    Number of Children: 6  . Years of Education: 10   Occupational History  . telephone operator     retired   Social History Main Topics  . Smoking status: Never Smoker   .  Smokeless tobacco: Never Used  . Alcohol Use: No  . Drug Use: No  . Sexual Activity: No   Other Topics Concern  . Not on file   Social History Narrative   10th grade. Married '51.4 sons- '52, '54, '58, '70; 2 daughters- '56, '64 older girl died of ovarian cancer.11 grandchildren, 2 great grands. 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      BP 132/64  Pulse 55  Ht 5\' 5"  (1.651 m)  Wt 187 lb 12.8 oz (85.186 kg)  BMI 31.25 kg/m2  Physical Exam:  Well appearing 79 year old woman, NAD HEENT: Unremarkable Neck:  7 cm JVD, no thyromegally Lungs:  Clear with no wheezes, rales, or rhonchi. HEART:  Regular rate rhythm, no murmurs, no rubs, no clicks Abd:  soft, obese,positive bowel sounds, no organomegally, no rebound, no  guarding Ext:  2 plus pulses, no edema, no cyanosis, no clubbing Skin:  No rashes no nodules Neuro:  CN II through XII intact, motor grossly intact  ECG - normal sinus rhythm   Assess/Plan:

## 2013-11-11 NOTE — Assessment & Plan Note (Signed)
She is maintaining sinus rhythm very nicely on low-dose amiodarone therapy. She'll continue her current medications.

## 2013-11-11 NOTE — Patient Instructions (Signed)
Your physician wants you to follow-up in: 12 months with Dr. Taylor. You will receive a reminder letter in the mail two months in advance. If you don't receive a letter, please call our office to schedule the follow-up appointment.    

## 2013-11-26 ENCOUNTER — Telehealth: Payer: Self-pay | Admitting: Internal Medicine

## 2013-11-26 MED ORDER — AMOXICILLIN-POT CLAVULANATE 875-125 MG PO TABS: 1.0000 | ORAL_TABLET | Freq: Two times a day (BID) | ORAL | Status: DC

## 2013-11-26 NOTE — Telephone Encounter (Signed)
Patient's spouse with bronchitis. She is also coughing and feels like she has a low grade fever.  Plan augmentin 875 mg bid x 7

## 2013-12-01 DIAGNOSIS — Z1231 Encounter for screening mammogram for malignant neoplasm of breast: Secondary | ICD-10-CM | POA: Diagnosis not present

## 2013-12-03 ENCOUNTER — Encounter: Payer: Medicare Other | Admitting: General Practice

## 2013-12-09 ENCOUNTER — Ambulatory Visit (INDEPENDENT_AMBULATORY_CARE_PROVIDER_SITE_OTHER): Payer: Medicare Other | Admitting: General Practice

## 2013-12-09 DIAGNOSIS — I4891 Unspecified atrial fibrillation: Secondary | ICD-10-CM | POA: Diagnosis not present

## 2013-12-09 DIAGNOSIS — I4892 Unspecified atrial flutter: Secondary | ICD-10-CM | POA: Diagnosis not present

## 2013-12-09 LAB — POCT INR: INR: 2.3

## 2013-12-09 NOTE — Progress Notes (Signed)
Pre-visit discussion using our clinic review tool. No additional management support is needed unless otherwise documented below in the visit note.  

## 2013-12-23 ENCOUNTER — Encounter: Payer: Self-pay | Admitting: Internal Medicine

## 2014-01-28 ENCOUNTER — Ambulatory Visit (INDEPENDENT_AMBULATORY_CARE_PROVIDER_SITE_OTHER): Payer: Medicare Other | Admitting: Family Medicine

## 2014-01-28 DIAGNOSIS — I4891 Unspecified atrial fibrillation: Secondary | ICD-10-CM | POA: Diagnosis not present

## 2014-01-28 DIAGNOSIS — I4892 Unspecified atrial flutter: Secondary | ICD-10-CM

## 2014-01-28 LAB — POCT INR: INR: 2.4

## 2014-02-01 ENCOUNTER — Other Ambulatory Visit: Payer: Self-pay | Admitting: Internal Medicine

## 2014-03-04 ENCOUNTER — Emergency Department (INDEPENDENT_AMBULATORY_CARE_PROVIDER_SITE_OTHER)
Admission: EM | Admit: 2014-03-04 | Discharge: 2014-03-04 | Disposition: A | Discharge status: Home / Self Care | Payer: Medicare Other | Source: Home / Self Care

## 2014-03-04 ENCOUNTER — Encounter (HOSPITAL_COMMUNITY): Payer: Self-pay | Admitting: Emergency Medicine

## 2014-03-04 DIAGNOSIS — J45909 Unspecified asthma, uncomplicated: Secondary | ICD-10-CM | POA: Diagnosis not present

## 2014-03-04 DIAGNOSIS — J309 Allergic rhinitis, unspecified: Secondary | ICD-10-CM | POA: Diagnosis not present

## 2014-03-04 MED ORDER — ALBUTEROL SULFATE (2.5 MG/3ML) 0.083% IN NEBU
INHALATION_SOLUTION | RESPIRATORY_TRACT | Status: AC
  Filled 2014-03-04: qty 3

## 2014-03-04 MED ORDER — ALBUTEROL SULFATE (2.5 MG/3ML) 0.083% IN NEBU
2.5000 mg | INHALATION_SOLUTION | Freq: Once | RESPIRATORY_TRACT | Status: AC
  Administered 2014-03-04: 2.5 mg via RESPIRATORY_TRACT

## 2014-03-04 MED ORDER — BECLOMETHASONE DIPROPIONATE 40 MCG/ACT IN AERS: 1.0000 | INHALATION_SPRAY | Freq: Two times a day (BID) | RESPIRATORY_TRACT | Status: DC

## 2014-03-04 MED ORDER — METHYLPREDNISOLONE SODIUM SUCC 125 MG IJ SOLR: 80.0000 mg | Freq: Once | INTRAMUSCULAR | Status: DC

## 2014-03-04 MED ORDER — ALBUTEROL SULFATE HFA 108 (90 BASE) MCG/ACT IN AERS: 2.0000 | INHALATION_SPRAY | RESPIRATORY_TRACT | Status: DC | PRN

## 2014-03-04 MED ORDER — METHYLPREDNISOLONE SODIUM SUCC 125 MG IJ SOLR
INTRAMUSCULAR | Status: AC
  Filled 2014-03-04: qty 2

## 2014-03-04 MED ORDER — TRIAMCINOLONE ACETONIDE 40 MG/ML IJ SUSP: 40.0000 mg | Freq: Once | INTRAMUSCULAR | Status: DC

## 2014-03-04 MED ORDER — METHYLPREDNISOLONE SODIUM SUCC 125 MG IJ SOLR
80.0000 mg | Freq: Once | INTRAMUSCULAR | Status: AC
Start: 2014-03-04 — End: 2014-03-04
  Administered 2014-03-04: 80 mg via INTRAMUSCULAR

## 2014-03-04 NOTE — ED Provider Notes (Signed)
CSN: [id number]     Arrival date & time 03/04/14  1044 History   First MD Initiated Contact with Patient 03/04/14 1231     Chief Complaint  Patient presents with  . URI   (Consider location/radiation/quality/duration/timing/severity/associated sxs/prior Treatment) HPI Comments: 80 year old female complaining of recent cough, runny nose, nasal congestion and occasional slight shortness of breath. She denies fever, chest pain, GI or GU symptoms.   Past Medical History  Diagnosis Date  . Aftercare for healing traumatic fracture of arm, unspecified   . Malignant melanoma of skin of trunk, except scrotum   . Rosacea   . Sprain of lumbar region   . Sciatica     right  . Other postprocedural status(V45.89)   . Hyperlipidemia   . H/O: hysterectomy   . S/P cholecystectomy   . H/O: hemorrhoidectomy   . Hypertension   . A-fib     paroxysmal   Past Surgical History  Procedure Laterality Date  . Catheter ablation of atrail flutter    . Hernia repair    . Bladder suspension    . Abdominal hysterectomy    . Cholecystectomy    . Hemorrhoid surgery     No family history on file. History  Substance Use Topics  . Smoking status: Never Smoker   . Smokeless tobacco: Never Used  . Alcohol Use: No   OB History   Grav Para Term Preterm Abortions TAB SAB Ect Mult Living                 Review of Systems  Constitutional: Positive for activity change. Negative for fever, chills, appetite change and fatigue.  HENT: Positive for congestion, postnasal drip, rhinorrhea and sinus pressure. Negative for facial swelling and sore throat.   Eyes: Negative.   Respiratory: Positive for cough and wheezing.        As per history of present illness  Cardiovascular: Negative.   Gastrointestinal: Negative.   Genitourinary: Negative.   Musculoskeletal: Negative for neck pain and neck stiffness.  Skin: Negative for pallor and rash.  Neurological: Negative.     Allergies   Oxycodone-acetaminophen  Home Medications   Current Outpatient Rx  Name  Route  Sig  Dispense  Refill  . atorvastatin (LIPITOR) 20 MG tablet      TAKE 1 TABLET AT BEDTIME         . hydrochlorothiazide (HYDRODIURIL) 25 MG tablet      TAKE 1 TABLET (25 MG TOTAL) BY MOUTH DAILY.   90 tablet   3   . metoprolol (LOPRESSOR) 50 MG tablet      TAKE 0.5 TABLETS (25 MG TOTAL) BY MOUTH 2 (TWO) TIMES DAILY.   90 tablet   3   . warfarin (COUMADIN) 2.5 MG tablet      Take as directed by the coumadin clinic         . acetaminophen (TYLENOL) 500 MG tablet   Oral   Take 1,000 mg by mouth every 6 (six) hours as needed. For pain         . albuterol (PROVENTIL HFA;VENTOLIN HFA) 108 (90 BASE) MCG/ACT inhaler   Inhalation   Inhale 2 puffs into the lungs every 4 (four) hours as needed for wheezing or shortness of breath.   1 Inhaler   0   . ALPRAZolam (XANAX) 0.25 MG tablet   Oral   Take 1 tablet (0.25 mg total) by mouth every 6 (six) hours as needed.   30 tablet   2   .  amiodarone (PACERONE) 200 MG tablet      Take 1 tablet once daily except for Friday, Saturday & Sunday only take 1/2 tablet         . amiodarone (PACERONE) 200 MG tablet      Take 1 tablet once daily except for Friday, Saturday & Sunday only take 1/2 tablet   90 tablet   1   . amoxicillin-clavulanate (AUGMENTIN) 875-125 MG per tablet   Oral   Take 1 tablet by mouth 2 (two) times daily.   14 tablet   0   . beclomethasone (QVAR) 40 MCG/ACT inhaler   Inhalation   Inhale 1 puff into the lungs 2 (two) times daily.   1 Inhaler   0   . Calcium Carbonate-Vitamin D (CALCIUM-VITAMIN D) 500-200 MG-UNIT per tablet   Oral   Take 1 tablet by mouth daily.           . Hydrocortisone (GERHARDT'S BUTT CREAM) CREA   Topical   Apply 1 application topically as needed for irritation.         . Multiple Vitamins-Minerals (MULTIVITAMIN WITH MINERALS) tablet   Oral   Take 1 tablet by mouth daily.            . traMADol (ULTRAM) 50 MG tablet   Oral   Take 50 mg by mouth every 6 (six) hours as needed.          BP 155/57  Pulse 63  Temp(Src) 97.9 F (36.6 C) (Oral)  Resp 16  SpO2 98% Physical Exam  Nursing note and vitals reviewed. Constitutional: She is oriented to person, place, and time. She appears well-developed and well-nourished. No distress.  HENT:  Bilateral TMs are normal Oropharynx with clear PND. No exudates or swelling.  Eyes: Conjunctivae and EOM are normal.  Neck: Normal range of motion. Neck supple.  Cardiovascular: Normal rate, regular rhythm and normal heart sounds.   Pulmonary/Chest: Effort normal. No respiratory distress. She has wheezes. She has no rales.  Mildly prolonged expiratory phase associated with expiratory wheezing bilaterally.  Lymphadenopathy:    She has no cervical adenopathy.  Neurological: She is alert and oriented to person, place, and time. She exhibits normal muscle tone.  Skin: Skin is warm and dry.  Psychiatric: She has a normal mood and affect.    ED Course  Procedures (including critical care time) Labs Review Labs Reviewed - No data to display Imaging Review No results found.   MDM   1. Allergic rhinosinusitis   2. RAD (reactive airway disease) with wheezing      Albuterol Neb. Modest to fair improvement in air movement and reduction in wheezes. Pt st she feels better. Repeat Albuterol neb 20 min after completing first, at 1325. Post second neb st breathing better and can take a deeper breath...she is ready to go.  Solumedrol 80 mg IM qvar 40 2 puffs bid Albuterol HFA 2 puffs q 4-6h prn cough and wheeze Allegra or claritin flonase ns Saline ns Robitussin DM    Janne Napoleon, NP 03/04/14 1352

## 2014-03-04 NOTE — ED Notes (Signed)
Pt c/o cold sxs onset 5 days Sxs include: productive cough, SOB, runny nose Denies f/v/n/d, wheezing Taking mucinex w/no relief Alert w/no signs of acute distress.

## 2014-03-04 NOTE — Discharge Instructions (Signed)
Allergic Rhinitis Allegra 60 mg or claritin 10 mg a day Robitussin DM Lots of saline nasal spray Flonase nasal spray twice a day Qvar inhaler and Albuterol inhaler for wheezing and cough. Allergic rhinitis is when the mucous membranes in the nose respond to allergens. Allergens are particles in the air that cause your body to have an allergic reaction. This causes you to release allergic antibodies. Through a chain of events, these eventually cause you to release histamine into the blood stream. Although meant to protect the body, it is this release of histamine that causes your discomfort, such as frequent sneezing, congestion, and an itchy, runny nose.  CAUSES  Seasonal allergic rhinitis (hay fever) is caused by pollen allergens that may come from grasses, trees, and weeds. Year-round allergic rhinitis (perennial allergic rhinitis) is caused by allergens such as house dust mites, pet dander, and mold spores.  SYMPTOMS   Nasal stuffiness (congestion).  Itchy, runny nose with sneezing and tearing of the eyes. DIAGNOSIS  Your health care provider can help you determine the allergen or allergens that trigger your symptoms. If you and your health care provider are unable to determine the allergen, skin or blood testing may be used. TREATMENT  Allergic Rhinitis does not have a cure, but it can be controlled by:  Medicines and allergy shots (immunotherapy).  Avoiding the allergen. Hay fever may often be treated with antihistamines in pill or nasal spray forms. Antihistamines block the effects of histamine. There are over-the-counter medicines that may help with nasal congestion and swelling around the eyes. Check with your health care provider before taking or giving this medicine.  If avoiding the allergen or the medicine prescribed do not work, there are many new medicines your health care provider can prescribe. Stronger medicine may be used if initial measures are ineffective. Desensitizing  injections can be used if medicine and avoidance does not work. Desensitization is when a patient is given ongoing shots until the body becomes less sensitive to the allergen. Make sure you follow up with your health care provider if problems continue. HOME CARE INSTRUCTIONS It is not possible to completely avoid allergens, but you can reduce your symptoms by taking steps to limit your exposure to them. It helps to know exactly what you are allergic to so that you can avoid your specific triggers. SEEK MEDICAL CARE IF:   You have a fever.  You develop a cough that does not stop easily (persistent).  You have shortness of breath.  You start wheezing.  Symptoms interfere with normal daily activities. Document Released: 08/08/2001 Document Revised: 09/03/2013 Document Reviewed: 07/21/2013 [facility] Patient Information 2014 [place].  Bronchospasm, Adult A bronchospasm is when the tubes that carry air in and out of your lungs (airwarys) spasm or tighten. During a bronchospasm it is hard to breathe. This is because the airways get smaller. A bronchospasm can be triggered by:  Allergies. These may be to animals, pollen, food, or mold.  Infection. This is a common cause of bronchospasm.  Exercise.  Irritants. These include pollution, cigarette smoke, strong odors, aerosol sprays, and paint fumes.  Weather changes.  Stress.  Being emotional. HOME CARE   Always have a plan for getting help. Know when to call your doctor and local emergency services (911 in the [place] Know where you can get emergency care.  Only take medicines as told by your doctor.  If you were prescribed an inhaler or nebulizer machine, ask your doctor how to use it correctly. Always  use a spacer with your inhaler if you were given one.  Stay calm during an attack. Try to relax and breathe more slowly.  Control your home environment:  Change your heating and air conditioning filter at least once a  month.  Limit your use of fireplaces and wood stoves.  Do not  smoke. Do not  allow smoking in your home.  Avoid perfumes and fragrances.  Get rid of pests (such as roaches and mice) and their droppings.  Throw away plants if you see mold on them.  Keep your house clean and dust free.  Replace carpet with wood, tile, or vinyl flooring. Carpet can trap dander and dust.  Use allergy-proof pillows, mattress covers, and box spring covers.  Wash bed sheets and blankets every week in hot water. Dry them in a dryer.  Use blankets that are made of polyester or cotton.  Wash hands frequently. GET HELP IF:  You have muscle aches.  You have chest pain.  The thick spit you spit or cough up (sputum) changes from clear or white to yellow, green, gray, or bloody.  The thick spit you spit or cough up gets thicker.  There are problems that may be related to the medicine you are given such as:  A rash.  Itching.  Swelling.  Trouble breathing. GET HELP RIGHT AWAY IF:  You feel you cannot breathe or catch your breath.  You cannot stop coughing.  Your treatment is not helping you breathe better. MAKE SURE YOU:   Understand these instructions.  Will watch your condition.  Will get help right away if you are not doing well or get worse. Document Released: 09/10/2009 Document Revised: 07/16/2013 Document Reviewed: 05/06/2013 [facility] Patient Information 2014 [place].  How to Use an Inhaler Using your inhaler correctly is very important. Good technique will make sure that the medicine reaches your lungs.  HOW TO USE AN INHALER: 1. Take the cap off the inhaler. 2. If this is the first time using your inhaler, you need to prime it. Shake the inhaler for 5 seconds. Release four puffs into the air, away from your face. Ask your doctor for help if you have questions. 3. Shake the inhaler for 5 seconds. 4. Turn the inhaler so the bottle is above the mouthpiece. 5. Put your  pointer finger on top of the bottle. Your thumb holds the bottom of the inhaler. 6. Open your mouth. 7. Either hold the inhaler away from your mouth (the width of 2 fingers) or place your lips tightly around the mouthpiece. Ask your doctor which way to use your inhaler. 8. Breathe out as much air as possible. 9. Breathe in and push down on the bottle 1 time to release the medicine. You will feel the medicine go in your mouth and throat. 10. Continue to take a deep breath in very slowly. Try to fill your lungs. 11. After you have breathed in completely, hold your breath for 10 seconds. This will help the medicine to settle in your lungs. If you cannot hold your breath for 10 seconds, hold it for as long as you can before you breathe out. 12. Breathe out slowly, through pursed lips. Whistling is an example of pursed lips. 13. If your doctor has told you to take more than 1 puff, wait at least 15 30 seconds between puffs. This will help you get the best results from your medicine. Do not use the inhaler more than your doctor tells you to. 14.  Put the cap back on the inhaler. 15. Follow the directions from your doctor or from the inhaler package about cleaning the inhaler. If you use more than one inhaler, ask your doctor which inhalers to use and what order to use them in. Ask your doctor to help you figure out when you will need to refill your inhaler.  If you use a steroid inhaler, always rinse your mouth with water after your last puff, gargle and spit out the water. Do not swallow the water. GET HELP IF:  The inhaler medicine only partially helps to stop wheezing or shortness of breath.  You are having trouble using your inhaler.  You have some increase in thick spit (phlegm). GET HELP RIGHT AWAY IF:  The inhaler medicine does not help your wheezing or shortness of breath or you have tightness in your chest.  You have dizziness, headaches, or fast heart rate.  You have chills, fever, or  night sweats.  You have a large increase of thick spit, or your thick spit is bloody. MAKE SURE YOU:   Understand these instructions.  Will watch your condition.  Will get help right away if you are not doing well or get worse. Document Released: 08/22/2008 Document Revised: 09/03/2013 Document Reviewed: 06/12/2013 [facility] Patient Information 2014 [place], [place].

## 2014-03-05 NOTE — ED Provider Notes (Signed)
Medical screening examination/treatment/procedure(s) were performed by non-physician practitioner and as supervising physician I was immediately available for consultation/collaboration.  Philipp Deputy, M.D.  Harden Mo, MD 03/05/14 [phone]

## 2014-03-20 ENCOUNTER — Ambulatory Visit (INDEPENDENT_AMBULATORY_CARE_PROVIDER_SITE_OTHER): Payer: Medicare Other

## 2014-03-20 DIAGNOSIS — I4891 Unspecified atrial fibrillation: Secondary | ICD-10-CM | POA: Diagnosis not present

## 2014-03-20 DIAGNOSIS — I4892 Unspecified atrial flutter: Secondary | ICD-10-CM | POA: Diagnosis not present

## 2014-03-20 LAB — POCT INR: INR: 2.6

## 2014-04-14 ENCOUNTER — Ambulatory Visit (INDEPENDENT_AMBULATORY_CARE_PROVIDER_SITE_OTHER): Payer: Medicare Other

## 2014-04-14 VITALS — BP 157/92 | HR 65 | Resp 15 | Ht 65.5 in | Wt 190.0 lb

## 2014-04-14 DIAGNOSIS — B351 Tinea unguium: Secondary | ICD-10-CM | POA: Diagnosis not present

## 2014-04-14 DIAGNOSIS — Q828 Other specified congenital malformations of skin: Secondary | ICD-10-CM

## 2014-04-14 DIAGNOSIS — M79609 Pain in unspecified limb: Secondary | ICD-10-CM | POA: Diagnosis not present

## 2014-04-14 DIAGNOSIS — L97509 Non-pressure chronic ulcer of other part of unspecified foot with unspecified severity: Secondary | ICD-10-CM | POA: Diagnosis not present

## 2014-04-14 NOTE — Patient Instructions (Signed)
Onychomycosis/Fungal Toenails  WHAT IS IT? An infection that lies within the keratin of your nail plate that is caused by a fungus.  WHY ME? Fungal infections affect all ages, sexes, races, and creeds.  There may be many factors that predispose you to a fungal infection such as age, coexisting medical conditions such as diabetes, or an autoimmune disease; stress, medications, fatigue, genetics, etc.  Bottom line: fungus thrives in a warm, moist environment and your shoes offer such a location.  IS IT CONTAGIOUS? Theoretically, yes.  You do not want to share shoes, nail clippers or files with someone who has fungal toenails.  Walking around barefoot in the same room or sleeping in the same bed is unlikely to transfer the organism.  It is important to realize, however, that fungus can spread easily from one nail to the next on the same foot.  HOW DO WE TREAT THIS?  There are several ways to treat this condition.  Treatment may depend on many factors such as age, medications, pregnancy, liver and kidney conditions, etc.  It is best to ask your doctor which options are available to you.  1. No treatment.   Unlike many other medical concerns, you can live with this condition.  However for many people this can be a painful condition and may lead to ingrown toenails or a bacterial infection.  It is recommended that you keep the nails cut short to help reduce the amount of fungal nail. 2. Topical treatment.  These range from herbal remedies to prescription strength nail lacquers.  About 40-50% effective, topicals require twice daily application for approximately 9 to 12 months or until an entirely new nail has grown out.  The most effective topicals are medical grade medications available through physicians offices. 3. Oral antifungal medications.  With an 80-90% cure rate, the most common oral medication requires 3 to 4 months of therapy and stays in your system for a year as the new nail grows out.  Oral  antifungal medications do require blood work to make sure it is a safe drug for you.  A liver function panel will be performed prior to starting the medication and after the first month of treatment.  It is important to have the blood work performed to avoid any harmful side effects.  In general, this medication safe but blood work is required. 4. Laser Therapy.  This treatment is performed by applying a specialized laser to the affected nail plate.  This therapy is noninvasive, fast, and non-painful.  It is not covered by insurance and is therefore, out of pocket.  The results have been very good with a 80-95% cure rate.  [facility] is the only practice in the area to offer this therapy. 5. Permanent Nail Avulsion.  Removing the entire nail so that a new nail will not grow back.  Recommend over-the-counter treatment which can be obtained at any drugstore. Obtain Fungi-Nail over-the-counter apply once or twice daily to all affected nails for 12 months

## 2014-04-14 NOTE — Progress Notes (Signed)
   Subjective:    Patient ID: Khaleah Duer [facility], female    DOB: Jul 20, 1931, 80 y.o.   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   Cardiovascular: Positive for leg swelling.  Musculoskeletal: Positive for back pain and gait problem.  Allergic/Immunologic: Positive for environmental allergies.  Neurological: Positive for tremors.  Hematological: Bruises/bleeds easily.  All other systems reviewed and are negative.      Objective:   Physical Exam 80 year old white female presents this time well-developed well-nourished oriented x3 with mild distress of both feet patient's husband recently passed away and she's been on her feet almost daily at all times for the last 30 days or more. Patient has a blister ulceration medial hallux IP joint left foot has thick brittle crumbly incurvated discolored in ingrowing nails both feet 1 through 5 as well as multiple hammertoes with digital contractures. Patient's pain discomfort with ambulation standing walking or with any include shoe at this time cannot tolerate the nails which are exquisitely painful tender and symptomatic also ulceration the IP joint left hallux noted. Orthopedic biomechanical exam rectal for rigid digital contractures 2 through 4 and 5 bilateral with adductovarus rotated fourth and fifth. There is mild bunion deformity noted bilateral hallux otherwise relatively rectus no secondary infection is noted at this time keratoses sub-5 bilateral secondary plantarflexed metatarsal pinch callus with ulceration first digit IP joint left as well as interdigital keratoses second digit right.  Did overlapping hammertoe deformity       Assessment & Plan:  Assessment this time is onychomycosis painful mycotic nails 1 through 5 bilateral debrided this time and the presence of pain in symptomology patient also some pinpoint bleeding which was addressed with lumicain and Neosporin. The nails are debrided and would recommend debridement the future as needed also topical antifungal therapy Fungi-Nail in the future to be obtained over-the-counter.  Patient also has hemorrhage a keratoses and ulceration of the hallux IP joint left this ulcer site is debrided down about 2 mm ulceration to the dermal level only not full-thickness partial-thickness however bleeding into the skin due to the Coumadin use. This time the ulcer site is debrided Neosporin and Band-Aid dressing applied maintain it was needed maintain more accommodative shoes and cushioning on the toes keep the toes separated were to prevent recurrence of keratoses patient is advised future palliative nail care may or may not be covered if not symptomatic are painful also debridement of corns or calluses the future likely noncovered service if needed [employer] forms are signed by the patient this time however today's visit should be covered due to ulceration and infection and onychomycosis which is painful and symptomatic recess recheck in 3 months for an as-needed basis for future followup  Harriet Masson DPM

## 2014-04-21 ENCOUNTER — Other Ambulatory Visit: Payer: Self-pay

## 2014-04-21 DIAGNOSIS — I4891 Unspecified atrial fibrillation: Secondary | ICD-10-CM

## 2014-04-21 MED ORDER — WARFARIN SODIUM 2.5 MG PO TABS: ORAL_TABLET | ORAL | Status: DC

## 2014-04-21 MED ORDER — ATORVASTATIN CALCIUM 20 MG PO TABS: ORAL_TABLET | ORAL | Status: DC

## 2014-05-08 ENCOUNTER — Ambulatory Visit (INDEPENDENT_AMBULATORY_CARE_PROVIDER_SITE_OTHER): Payer: Medicare Other | Admitting: General Practice

## 2014-05-08 DIAGNOSIS — Z5181 Encounter for therapeutic drug level monitoring: Secondary | ICD-10-CM | POA: Insufficient documentation

## 2014-05-08 DIAGNOSIS — I4892 Unspecified atrial flutter: Secondary | ICD-10-CM | POA: Diagnosis not present

## 2014-05-08 DIAGNOSIS — I4891 Unspecified atrial fibrillation: Secondary | ICD-10-CM

## 2014-05-08 LAB — POCT INR: INR: 3

## 2014-05-08 NOTE — Progress Notes (Signed)
Pre visit review using our clinic review tool, if applicable. No additional management support is needed unless otherwise documented below in the visit note. 

## 2014-05-15 ENCOUNTER — Telehealth: Payer: Self-pay | Admitting: Internal Medicine

## 2014-05-15 DIAGNOSIS — I482 Chronic atrial fibrillation, unspecified: Secondary | ICD-10-CM

## 2014-05-15 NOTE — Telephone Encounter (Signed)
Pt call request refill for warfarin. They gave her 30 days supply and told her that she need new PCP, she is schedule with Dr. Doug Sou. Not sure if pt need to see any provider for this refill or if you can help her. Please advise.

## 2014-05-18 MED ORDER — WARFARIN SODIUM 2.5 MG PO TABS: ORAL_TABLET | ORAL | Status: DC

## 2014-06-02 ENCOUNTER — Other Ambulatory Visit: Payer: Self-pay

## 2014-06-02 DIAGNOSIS — I4891 Unspecified atrial fibrillation: Secondary | ICD-10-CM

## 2014-06-02 MED ORDER — ATORVASTATIN CALCIUM 20 MG PO TABS: ORAL_TABLET | ORAL | Status: DC

## 2014-06-24 ENCOUNTER — Ambulatory Visit (INDEPENDENT_AMBULATORY_CARE_PROVIDER_SITE_OTHER): Payer: Medicare Other | Admitting: General Practice

## 2014-06-24 DIAGNOSIS — Z5181 Encounter for therapeutic drug level monitoring: Secondary | ICD-10-CM

## 2014-06-24 DIAGNOSIS — I4891 Unspecified atrial fibrillation: Secondary | ICD-10-CM | POA: Diagnosis not present

## 2014-06-24 LAB — POCT INR: INR: 2.2

## 2014-06-24 NOTE — Progress Notes (Signed)
Pre visit review using our clinic review tool, if applicable. No additional management support is needed unless otherwise documented below in the visit note. 

## 2014-07-13 ENCOUNTER — Telehealth: Payer: Self-pay | Admitting: Internal Medicine

## 2014-07-13 NOTE — Telephone Encounter (Signed)
New message      Pt had an ablation years ago, her heart rate was 110 over the weekend.  She did take clariton, flonase and an inhaler over the weekend.  Could it have been the medication that made her heart rate go up?

## 2014-07-13 NOTE — Telephone Encounter (Signed)
Reviewed with Dr Lovena Le -increase amiodarone to 400mg  daily for 3 days, call if heart rate does not return to normal after 3 days.  Pt advised, verbalized understanding.

## 2014-07-13 NOTE — Telephone Encounter (Signed)
Pt states about a week ago she had drippy nose. She took claritin, proair HFA, and flonase for several days.  Pt stopped taking these medications on Saturday.  She noticed her heart rate had been in 80s-90s off and on today.  Her heart rate is usually 56-64.

## 2014-07-16 ENCOUNTER — Telehealth: Payer: Self-pay | Admitting: Internal Medicine

## 2014-07-16 NOTE — Telephone Encounter (Signed)
I spoke with the pt and she increased Amiodarone to 400mg  daily for 3 days as instructed by Dr Lovena Le on 07/13/14. The pt's pulse continues to "run all over the place", 50-110.  The pt states that she has not taken any decongestants or antihistamines since Saturday. The pt does have chest congestion and has a cough.  She has scheduled an appointment tomorrow with Dr Linna Darner. I will forward this message to Dr Lovena Le to review since the pt's pulse remains elevated. The pt's home phone is not working at this time and her cell [phone] is the best contact number.

## 2014-07-16 NOTE — Telephone Encounter (Signed)
New Message  Pt called to give an update of blood pressure (no readings provided) due to the dosage change of amiodarone.. From 200-400 mg Please call the pt back to discuss.

## 2014-07-17 ENCOUNTER — Other Ambulatory Visit (INDEPENDENT_AMBULATORY_CARE_PROVIDER_SITE_OTHER): Payer: Medicare Other

## 2014-07-17 ENCOUNTER — Ambulatory Visit (INDEPENDENT_AMBULATORY_CARE_PROVIDER_SITE_OTHER)
Admission: RE | Admit: 2014-07-17 | Discharge: 2014-07-17 | Disposition: A | Discharge status: Home / Self Care | Payer: Medicare Other | Source: Ambulatory Visit | Attending: Internal Medicine | Admitting: Internal Medicine

## 2014-07-17 ENCOUNTER — Other Ambulatory Visit: Payer: Self-pay

## 2014-07-17 ENCOUNTER — Other Ambulatory Visit: Payer: Self-pay | Admitting: *Deleted

## 2014-07-17 ENCOUNTER — Encounter: Payer: Self-pay | Admitting: Internal Medicine

## 2014-07-17 ENCOUNTER — Ambulatory Visit (INDEPENDENT_AMBULATORY_CARE_PROVIDER_SITE_OTHER): Payer: Medicare Other | Admitting: Internal Medicine

## 2014-07-17 VITALS — BP 160/100 | HR 90 | Temp 98.3°F | Wt 188.4 lb

## 2014-07-17 DIAGNOSIS — J209 Acute bronchitis, unspecified: Secondary | ICD-10-CM

## 2014-07-17 DIAGNOSIS — I10 Essential (primary) hypertension: Secondary | ICD-10-CM

## 2014-07-17 DIAGNOSIS — I4819 Other persistent atrial fibrillation: Secondary | ICD-10-CM

## 2014-07-17 DIAGNOSIS — I4891 Unspecified atrial fibrillation: Secondary | ICD-10-CM

## 2014-07-17 DIAGNOSIS — J40 Bronchitis, not specified as acute or chronic: Secondary | ICD-10-CM | POA: Diagnosis not present

## 2014-07-17 MED ORDER — METOPROLOL TARTRATE 50 MG PO TABS: ORAL_TABLET | ORAL | Status: DC

## 2014-07-17 MED ORDER — WARFARIN SODIUM 2.5 MG PO TABS: ORAL_TABLET | ORAL | Status: DC

## 2014-07-17 MED ORDER — AMOXICILLIN 500 MG PO CAPS: 500.0000 mg | ORAL_CAPSULE | Freq: Three times a day (TID) | ORAL | Status: DC

## 2014-07-17 MED ORDER — PREDNISONE 20 MG PO TABS: 20.0000 mg | ORAL_TABLET | Freq: Two times a day (BID) | ORAL | Status: DC

## 2014-07-17 NOTE — Patient Instructions (Signed)
Plain Mucinex (NOT D) for thick secretions ;force NON dairy fluids up to 32 oz daily.   Flonase OR Nasacort AQ 1 spray in each nostril twice a day as needed. Use the "crossover" technique into opposite nostril spraying toward opposite ear @ 45 degree angle, not straight up into nostril.  Plain Allegra (NOT D )  160 daily , Loratidine 10 mg , OR Zyrtec 10 mg @ bedtime  as needed for itchy eyes & sneezing.

## 2014-07-17 NOTE — Progress Notes (Signed)
Pre visit review using our clinic review tool, if applicable. No additional management support is needed unless otherwise documented below in the visit note. 

## 2014-07-17 NOTE — Progress Notes (Signed)
   Subjective:    Patient ID: [name] [name] [facility], female    DOB: Dec 31, 1930, 80 y.o.   MRN: [id number]  HPI    Her symptoms began 07/05/14 as rhinitis with postnasal drainage and scratchy throat.  Subsequently she developed chest & throat congestion associated with paroxysms of cough lasting 30 seconds to 3 minutes. She is now producing a green sputum which is "glue-like".  The cough is aggravated by talking or activity. She does have some discomfort with deep breathing. She also describes some wheezing as well as sneezing. The symptoms are associated shortness of breath.  She did use Albuterol MDI prescribed by the urgent care last year; but this resulted in tachycardia.  Her amiodarone was increased by Dr. Lovena Le this week because of increased heart rate.  She's never smoked. She is not on ACE inhibitor. She denies itchy, watery eyes.    Review of Systems Frontal headache, facial pain , nasal purulence, dental pain, sore throat , otic pain or otic discharge denied. No fever , chills or sweats.     Objective:   Physical Exam   Positive or pertinent findings include: Upper dental plate. She has mild rhonchi at the bases. She has paroxysms of cough with deep breathing. Heart rhythm is irregular  rapid. She has intermittent coarse tremor of the  upper extremities & head.  General appearance:well nourished; no acute distress or increased work of breathing is present.  No  lymphadenopathy about the head, neck, or axilla noted.  Eyes: No conjunctival inflammation or lid edema is present. There is no scleral icterus. Ears:  External ear exam shows no significant lesions or deformities.  Otoscopic examination reveals clear canals, tympanic membranes are intact bilaterally without bulging, retraction, inflammation or discharge. Nose:  External nasal examination shows no deformity or inflammation. Nasal mucosa are pink and moist without lesions or exudates. No septal dislocation or deviation.No  obstruction to airflow.  Oral exam:  lips and gums are healthy appearing.There is no oropharyngeal erythema or exudate noted.  Neck:  No deformities, thyromegaly, masses, or tenderness noted.   Supple without pain.  Extremities:  No cyanosis, edema, or clubbing  noted  Skin: Warm & dry w/o jaundice or tenting.         Assessment & Plan:  #1 acute bronchitis w/o bronchospasm #2 AF  #3 HTN #4 tachycardia with Albuterol MDI Plan: See orders and recommendations

## 2014-07-17 NOTE — Telephone Encounter (Signed)
I spoke with the [name] [name] and made her aware that Dr Lovena Le would like her to continue Amiodarone 400mg  daily and proceed to the ER if she develops CP, SOB or syncope.  The [name] [name] is going to follow her plan of care from Dr Linna Darner in regards to bronchitis and if she is still having issues with her pulse after finishing this treatment then she will contact our office. The [name] [name] said she normally takes a half dose (100mg ) of Amiodarone 3 days a week and I advised her that she can take 200mg  on these days if that makes her more comfortable. [name] [name] agreed with plan.

## 2014-07-17 NOTE — Telephone Encounter (Signed)
I left a detailed message on the pt's cell phone in regards to Dr Tanna Furry recommendation.  The pt did see Dr Linna Darner today and was diagnosed with Bronchitis.

## 2014-07-17 NOTE — Telephone Encounter (Signed)
Continue the extra amiodarone. If the patient cannot breath, has chest pain or syncope, she will need to go to the ED. GT

## 2014-07-18 LAB — BASIC METABOLIC PANEL
BUN: 15 mg/dL (ref 6–23)
CO2: 26 mEq/L (ref 19–32)
Calcium: 9.9 mg/dL (ref 8.4–10.5)
Chloride: 105 mEq/L (ref 96–112)
Creatinine, Ser: 0.9 mg/dL (ref 0.4–1.2)
GFR: 64.73 mL/min (ref >60.00)
Glucose, Bld: 101 mg/dL — ABNORMAL HIGH (ref 70–99)
Potassium: 3.8 mEq/L (ref 3.5–5.1)
Sodium: 142 mEq/L (ref 135–145)

## 2014-07-18 LAB — CBC WITH DIFFERENTIAL/PLATELET
Basophils Absolute: 0 10*3/uL (ref 0.0–0.1)
Basophils Relative: 0.5 % (ref 0.0–3.0)
Eosinophils Absolute: 0.1 10*3/uL (ref 0.0–0.7)
Eosinophils Relative: 1.2 % (ref 0.0–5.0)
HCT: 47.2 % — ABNORMAL HIGH (ref 36.0–46.0)
Hemoglobin: 15.6 g/dL — ABNORMAL HIGH (ref 12.0–15.0)
Lymphocytes Relative: 26.7 % (ref 12.0–46.0)
Lymphs Abs: 2 10*3/uL (ref 0.7–4.0)
MCHC: 33.1 g/dL (ref 30.0–36.0)
MCV: 94.7 fl (ref 78.0–100.0)
Monocytes Absolute: 0.4 10*3/uL (ref 0.1–1.0)
Monocytes Relative: 5.7 % (ref 3.0–12.0)
Neutro Abs: 5 10*3/uL (ref 1.4–7.7)
Neutrophils Relative %: 65.9 % (ref 43.0–77.0)
Platelets: 286 10*3/uL (ref 150.0–400.0)
RBC: 4.99 Mil/uL (ref 3.87–5.11)
RDW: 13.6 % (ref 11.5–15.5)
WBC: 7.6 10*3/uL (ref 4.0–10.5)

## 2014-07-20 ENCOUNTER — Ambulatory Visit: Payer: Medicare Other

## 2014-07-20 ENCOUNTER — Telehealth: Payer: Self-pay

## 2014-07-20 DIAGNOSIS — R7309 Other abnormal glucose: Secondary | ICD-10-CM

## 2014-07-20 LAB — HEMOGLOBIN A1C: Hgb A1c MFr Bld: 6.2 % (ref 4.6–6.5)

## 2014-07-20 NOTE — Telephone Encounter (Signed)
Message copied by Shelly Coss on Mon Jul 20, 2014  9:14 AM ------      Message from: Hendricks Limes      Created: Sun Jul 19, 2014 10:49 AM       Please add A1c (790.29)       ------

## 2014-07-20 NOTE — Telephone Encounter (Signed)
Request for add on has been faxed to lab 

## 2014-07-21 ENCOUNTER — Ambulatory Visit: Payer: PRIVATE HEALTH INSURANCE

## 2014-07-21 ENCOUNTER — Encounter: Payer: Self-pay | Admitting: Internal Medicine

## 2014-08-04 ENCOUNTER — Ambulatory Visit (INDEPENDENT_AMBULATORY_CARE_PROVIDER_SITE_OTHER): Payer: Medicare Other | Admitting: *Deleted

## 2014-08-04 ENCOUNTER — Ambulatory Visit (INDEPENDENT_AMBULATORY_CARE_PROVIDER_SITE_OTHER): Payer: Medicare Other

## 2014-08-04 DIAGNOSIS — Z5181 Encounter for therapeutic drug level monitoring: Secondary | ICD-10-CM | POA: Diagnosis not present

## 2014-08-04 DIAGNOSIS — M79609 Pain in unspecified limb: Secondary | ICD-10-CM

## 2014-08-04 DIAGNOSIS — Q828 Other specified congenital malformations of skin: Secondary | ICD-10-CM

## 2014-08-04 DIAGNOSIS — B351 Tinea unguium: Secondary | ICD-10-CM | POA: Diagnosis not present

## 2014-08-04 DIAGNOSIS — M79676 Pain in unspecified toe(s): Secondary | ICD-10-CM

## 2014-08-04 DIAGNOSIS — I4819 Other persistent atrial fibrillation: Secondary | ICD-10-CM

## 2014-08-04 DIAGNOSIS — I4891 Unspecified atrial fibrillation: Secondary | ICD-10-CM | POA: Diagnosis not present

## 2014-08-04 DIAGNOSIS — L84 Corns and callosities: Secondary | ICD-10-CM

## 2014-08-04 LAB — POCT INR: INR: 3.6

## 2014-08-04 NOTE — Patient Instructions (Signed)
Corns and Calluses Corns are small areas of thickened skin that usually occur on the top, sides, or tip of a toe. They contain a cone-shaped core with a point that can press on a nerve below. This causes pain. Calluses are areas of thickened skin that usually develop on hands, fingers, palms, soles of the feet, and heels. These are areas that experience frequent friction or pressure. CAUSES  Corns are usually the result of rubbing (friction) or pressure from shoes that are too tight or do not fit properly. Calluses are caused by repeated friction and pressure on the affected areas. SYMPTOMS  A hard growth on the skin.  Pain or tenderness under the skin.  Sometimes, redness and swelling.  Increased discomfort while wearing tight-fitting shoes. DIAGNOSIS  Your caregiver can usually tell what the problem is by doing a physical exam. TREATMENT  Removing the cause of the friction or pressure is usually the only treatment needed. However, sometimes medicines can be used to help soften the hardened, thickened areas. These medicines include salicylic acid plasters and 12% ammonium lactate lotion. These medicines should only be used under the direction of your caregiver. HOME CARE INSTRUCTIONS   Try to remove pressure from the affected area.  You may wear donut-shaped corn pads to protect your skin.  You may use a pumice stone or nonmetallic nail file to gently reduce the thickness of a corn.  Wear properly fitted footwear.  If you have calluses on the hands, wear gloves during activities that cause friction.  If you have diabetes, you should regularly examine your feet. Tell your caregiver if you notice any problems with your feet. SEEK IMMEDIATE MEDICAL CARE IF:   You have increased pain, swelling, redness, or warmth in the affected area.  Your corn or callus starts to drain fluid or bleeds.  You are not getting better, even with treatment. Document Released: 08/19/2004 Document  Revised: 02/05/2012 Document Reviewed: 07/11/2011 [employer] Patient Information 2015 [employer]. This information is not intended to replace advice given to you by your health care provider. Make sure you discuss any questions you have with your health care provider.  

## 2014-08-04 NOTE — Progress Notes (Signed)
   Subjective:    Patient ID: [name] [name] [facility], female    DOB: Apr 16, 1931, 80 y.o.   MRN: [id number]  HPI Comments: Pt request trimming of 10 toenails and callouses B/L 1st toe, left 5th, left 3rd toe tip.     Review of Systems no new findings or systemic changes no     Objective:   Physical Exam Neurovascular status is intact and unchanged patient continues to have thick criptotic incurvated and brittle crumbly nails 1 through 5 bilateral also keratoses pinch callus of the hallux left with 3 ulcer keratoses also sub-fifth MTP area left secondary plantarflexed fifth metatarsal tailor bunion. Pedal pulses are palpable DP +2/4 PT plus one over 4 bilateral capillary refill timed 3-4 seconds. Epicritic and proprioceptive sensations intact and symmetric bilateral there is normal plantar response DTRs not listed there is digital contractures hammertoe deformities associated keratoses again plantarflexed metatarsal fifth left as well as hallux limitus rigidus deformity first MTP area left. The nails extremely criptotic incurvated and painful tender both on palpation ambulation with enclosed shoe wear       Assessment & Plan:  Assessment this time onychomycosis painful mycotic nails debrided 1 through 5 bilateral this time Neosporin applied to the hallux left and fifth digit right following debridement treated with lumicain Neosporin patient does have hemorrhage a keratoses sub-hallux IP joint left and sub-fifth MTP area left multiple keratoses are debrided likely noncovered service return for future palliative care. Mycotic nail care and corns and calluses report keratoses as needed  Harriet Masson DPM

## 2014-08-21 ENCOUNTER — Ambulatory Visit (INDEPENDENT_AMBULATORY_CARE_PROVIDER_SITE_OTHER): Payer: Medicare Other | Admitting: Internal Medicine

## 2014-08-21 ENCOUNTER — Encounter: Payer: Self-pay | Admitting: Internal Medicine

## 2014-08-21 VITALS — BP 142/78 | HR 105 | Temp 98.1°F | Resp 14 | Ht 65.0 in | Wt 190.0 lb

## 2014-08-21 DIAGNOSIS — I4891 Unspecified atrial fibrillation: Secondary | ICD-10-CM | POA: Diagnosis not present

## 2014-08-21 DIAGNOSIS — I10 Essential (primary) hypertension: Secondary | ICD-10-CM | POA: Diagnosis not present

## 2014-08-21 DIAGNOSIS — Z23 Encounter for immunization: Secondary | ICD-10-CM | POA: Diagnosis not present

## 2014-08-21 DIAGNOSIS — E785 Hyperlipidemia, unspecified: Secondary | ICD-10-CM

## 2014-08-21 DIAGNOSIS — I4819 Other persistent atrial fibrillation: Secondary | ICD-10-CM

## 2014-08-21 DIAGNOSIS — Z0000 Encounter for general adult medical examination without abnormal findings: Secondary | ICD-10-CM

## 2014-08-21 NOTE — Assessment & Plan Note (Signed)
Flu shot given today

## 2014-08-21 NOTE — Assessment & Plan Note (Signed)
She is taking amiodarone and metoprolol. HR high end of normal today and if continues to be elevated at next visit could increase lopressor to 1 pill BID (now 1/2 pill BID). She is also on coumadin and follows with the coumadin clinic for INR monitoring.

## 2014-08-21 NOTE — Assessment & Plan Note (Signed)
Continue lipitor  ?

## 2014-08-21 NOTE — Patient Instructions (Addendum)
We will have you try melatonin for sleeping. It is over the counter and ask the pharmacist to help you find it. You will take 1 pill before bed and it should help you to sleep through the night better.   We are not going to change your medicines today but want to check on you in about 6 months. If you have problems sooner please feel free to call our office.  It was a pleasure to meet you today and I look forward to seeing you back.  Insomnia Insomnia is frequent trouble falling and/or staying asleep. Insomnia can be a long term problem or a short term problem. Both are common. Insomnia can be a short term problem when the wakefulness is related to a certain stress or worry. Long term insomnia is often related to ongoing stress during waking hours and/or poor sleeping habits. Overtime, sleep deprivation itself can make the problem worse. Every little thing feels more severe because you are overtired and your ability to cope is decreased. CAUSES   Stress, anxiety, and depression.  Poor sleeping habits.  Distractions such as TV in the bedroom.  Naps close to bedtime.  Engaging in emotionally charged conversations before bed.  Technical reading before sleep.  Alcohol and other sedatives. They may make the problem worse. They can hurt normal sleep patterns and normal dream activity.  Stimulants such as caffeine for several hours prior to bedtime.  Pain syndromes and shortness of breath can cause insomnia.  Exercise late at night.  Changing time zones may cause sleeping problems (jet lag). It is sometimes helpful to have someone observe your sleeping patterns. They should look for periods of not breathing during the night (sleep apnea). They should also look to see how long those periods last. If you live alone or observers are uncertain, you can also be observed at a sleep clinic where your sleep patterns will be professionally monitored. Sleep apnea requires a checkup and treatment. Give  your caregivers your medical history. Give your caregivers observations your family has made about your sleep.  SYMPTOMS   Not feeling rested in the morning.  Anxiety and restlessness at bedtime.  Difficulty falling and staying asleep. TREATMENT   Your caregiver may prescribe treatment for an underlying medical disorders. Your caregiver can give advice or help if you are using alcohol or other drugs for self-medication. Treatment of underlying problems will usually eliminate insomnia problems.  Medications can be prescribed for short time use. They are generally not recommended for lengthy use.  Over-the-counter sleep medicines are not recommended for lengthy use. They can be habit forming.  You can promote easier sleeping by making lifestyle changes such as:  Using relaxation techniques that help with breathing and reduce muscle tension.  Exercising earlier in the day.  Changing your diet and the time of your last meal. No night time snacks.  Establish a regular time to go to bed.  Counseling can help with stressful problems and worry.  Soothing music and white noise may be helpful if there are background noises you cannot remove.  Stop tedious detailed work at least one hour before bedtime. HOME CARE INSTRUCTIONS   Keep a diary. Inform your caregiver about your progress. This includes any medication side effects. See your caregiver regularly. Take note of:  Times when you are asleep.  Times when you are awake during the night.  The quality of your sleep.  How you feel the next day. This information will help your caregiver care  for you.  Get out of bed if you are still awake after 15 minutes. Read or do some quiet activity. Keep the lights down. Wait until you feel sleepy and go back to bed.  Keep regular sleeping and waking hours. Avoid naps.  Exercise regularly.  Avoid distractions at bedtime. Distractions include watching television or engaging in any intense or  detailed activity like attempting to balance the household checkbook.  Develop a bedtime ritual. Keep a familiar routine of bathing, brushing your teeth, climbing into bed at the same time each night, listening to soothing music. Routines increase the success of falling to sleep faster.  Use relaxation techniques. This can be using breathing and muscle tension release routines. It can also include visualizing peaceful scenes. You can also help control troubling or intruding thoughts by keeping your mind occupied with boring or repetitive thoughts like the old concept of counting sheep. You can make it more creative like imagining planting one beautiful flower after another in your backyard garden.  During your day, work to eliminate stress. When this is not possible use some of the previous suggestions to help reduce the anxiety that accompanies stressful situations. MAKE SURE YOU:   Understand these instructions.  Will watch your condition.  Will get help right away if you are not doing well or get worse. Document Released: 11/10/2000 Document Revised: 02/05/2012 Document Reviewed: 12/11/2007 [facility] Patient Information 2015 [place], [place]. This information is not intended to replace advice given to you by your health care provider. Make sure you discuss any questions you have with your health care provider.

## 2014-08-21 NOTE — Progress Notes (Signed)
Pre visit review using our clinic review tool, if applicable. No additional management support is needed unless otherwise documented below in the visit note. 

## 2014-08-21 NOTE — Assessment & Plan Note (Signed)
BP mildly elevated but came down with sitting for 10 minutes. Continue HCTZ 25 mg daily, metoprolol 25 mg BID, amiodarone 200 mg daily. Filed Vitals:   08/21/14 1028  BP: 142/78  Pulse:   Temp:   Resp:

## 2014-08-21 NOTE — Progress Notes (Signed)
   Subjective:    Patient ID: [name] [name] [facility], female    DOB: [birth year], 80 y.o.   MRN: [id number]  HPI The patient is an 80 YO female who is coming in to establish care. She has lost her husband earlier this year and is still trying to adjust to life without him. She has been nervous about a few things and wanted to ask about them. She is having some problems with sleeping at night and wanted to ask about a safe option that is not addictive. She also wanted to know if she could take xanax and has taken it only 1-2 times per 6 months. She is not having chest pains, SOB, abdominal pain, constipation, diarrhea. She has 5 living children and they are doing well at staying in touch with her and getting her out of the house some. She has not needed her albuterol inhaler in a long time. She occasionally gets shoulder pains but taking tylenol resolves the pain.  Review of Systems  Constitutional: Negative for fever, activity change, appetite change and fatigue.  HENT: Negative.   Eyes: Negative.   Respiratory: Negative for cough, chest tightness and shortness of breath.   Cardiovascular: Negative for chest pain, palpitations and leg swelling.  Gastrointestinal: Negative for abdominal pain, diarrhea, constipation and abdominal distention.  Endocrine: Negative.   Genitourinary: Negative.   Musculoskeletal: Positive for arthralgias. Negative for gait problem and myalgias.       Mild aches and pains but not consistent  Neurological: Negative for dizziness, weakness, light-headedness and headaches.      Objective:   Physical Exam  Constitutional: She is oriented to person, place, and time. She appears well-developed and well-nourished.  HENT:  Head: Normocephalic and atraumatic.  Eyes: EOM are normal.  Neck: Normal range of motion.  Cardiovascular:  Slightly fast rate and appears to be irregular although occasional irregular beat.   Pulmonary/Chest: Effort normal and breath sounds normal. No  respiratory distress. She has no wheezes. She has no rales.  Abdominal: Soft. Bowel sounds are normal. She exhibits no distension. There is no tenderness. There is no rebound.  Neurological: She is alert and oriented to person, place, and time.  Skin: Skin is warm and dry.   Filed Vitals:   08/21/14 0948 08/21/14 1028  BP: 160/90 142/78  Pulse: 105   Temp: 98.1 F (36.7 C)   TempSrc: Oral   Resp: 14   Height: 5\' 5"  (1.651 m)   Weight: 190 lb (86.183 kg)   SpO2: 97%       Assessment & Plan:  She got flu shot today.

## 2014-09-09 ENCOUNTER — Telehealth: Payer: Self-pay | Admitting: Internal Medicine

## 2014-09-09 DIAGNOSIS — I4891 Unspecified atrial fibrillation: Secondary | ICD-10-CM

## 2014-09-09 DIAGNOSIS — I4819 Other persistent atrial fibrillation: Secondary | ICD-10-CM

## 2014-09-09 DIAGNOSIS — I10 Essential (primary) hypertension: Secondary | ICD-10-CM

## 2014-09-09 MED ORDER — METOPROLOL TARTRATE 50 MG PO TABS: ORAL_TABLET | ORAL | Status: DC

## 2014-09-09 MED ORDER — ATORVASTATIN CALCIUM 20 MG PO TABS: ORAL_TABLET | ORAL | Status: DC

## 2014-09-09 MED ORDER — WARFARIN SODIUM 2.5 MG PO TABS: ORAL_TABLET | ORAL | Status: DC

## 2014-09-09 MED ORDER — HYDROCHLOROTHIAZIDE 25 MG PO TABS: ORAL_TABLET | ORAL | Status: DC

## 2014-09-09 NOTE — Telephone Encounter (Signed)
Notified pt will send lipitor to [facility]. Pt stated she is needing refill on her other meds too. Inform her will send...[name] [name]

## 2014-09-09 NOTE — Telephone Encounter (Signed)
Pt called in looking for a refill on her atorvastatin (LIPITOR) 20 MG tablet [[id number]].  She stated that the pharmacy faxed it over last week.  She is requesting it to be sent to pharmacy on file

## 2014-09-23 ENCOUNTER — Ambulatory Visit (INDEPENDENT_AMBULATORY_CARE_PROVIDER_SITE_OTHER): Payer: Medicare Other | Admitting: *Deleted

## 2014-09-23 DIAGNOSIS — I4891 Unspecified atrial fibrillation: Secondary | ICD-10-CM | POA: Diagnosis not present

## 2014-09-23 LAB — POCT INR: INR: 2.7

## 2014-10-27 ENCOUNTER — Ambulatory Visit: Payer: PRIVATE HEALTH INSURANCE

## 2014-11-04 ENCOUNTER — Telehealth: Payer: Self-pay | Admitting: Family

## 2014-11-04 ENCOUNTER — Ambulatory Visit (INDEPENDENT_AMBULATORY_CARE_PROVIDER_SITE_OTHER): Payer: Medicare Other

## 2014-11-04 DIAGNOSIS — I4891 Unspecified atrial fibrillation: Secondary | ICD-10-CM | POA: Diagnosis not present

## 2014-11-04 LAB — POCT INR: INR: 3.3

## 2014-11-04 NOTE — Telephone Encounter (Signed)
Agree with plan 

## 2014-11-18 ENCOUNTER — Ambulatory Visit: Payer: Medicare Other | Admitting: Family Medicine

## 2014-11-18 DIAGNOSIS — I4891 Unspecified atrial fibrillation: Secondary | ICD-10-CM

## 2014-11-18 DIAGNOSIS — I4819 Other persistent atrial fibrillation: Secondary | ICD-10-CM

## 2014-11-18 LAB — POCT INR: INR: 3.8

## 2014-11-25 ENCOUNTER — Ambulatory Visit: Payer: Medicare Other | Admitting: Internal Medicine

## 2014-12-02 DIAGNOSIS — Z1231 Encounter for screening mammogram for malignant neoplasm of breast: Secondary | ICD-10-CM | POA: Diagnosis not present

## 2014-12-04 DIAGNOSIS — H1849 Other corneal degeneration: Secondary | ICD-10-CM | POA: Diagnosis not present

## 2014-12-04 DIAGNOSIS — H2513 Age-related nuclear cataract, bilateral: Secondary | ICD-10-CM | POA: Diagnosis not present

## 2014-12-04 DIAGNOSIS — H43813 Vitreous degeneration, bilateral: Secondary | ICD-10-CM | POA: Diagnosis not present

## 2014-12-05 ENCOUNTER — Other Ambulatory Visit: Payer: Self-pay | Admitting: Internal Medicine

## 2014-12-09 ENCOUNTER — Ambulatory Visit (INDEPENDENT_AMBULATORY_CARE_PROVIDER_SITE_OTHER): Payer: Medicare Other | Admitting: Family Medicine

## 2014-12-09 DIAGNOSIS — I481 Persistent atrial fibrillation: Secondary | ICD-10-CM | POA: Diagnosis not present

## 2014-12-09 DIAGNOSIS — I4819 Other persistent atrial fibrillation: Secondary | ICD-10-CM

## 2014-12-09 DIAGNOSIS — I4891 Unspecified atrial fibrillation: Secondary | ICD-10-CM

## 2014-12-09 LAB — POCT INR: INR: 2.3

## 2014-12-10 ENCOUNTER — Other Ambulatory Visit: Payer: Self-pay | Admitting: Geriatric Medicine

## 2014-12-10 DIAGNOSIS — I4819 Other persistent atrial fibrillation: Secondary | ICD-10-CM

## 2014-12-10 MED ORDER — WARFARIN SODIUM 2.5 MG PO TABS: ORAL_TABLET | ORAL | Status: DC

## 2014-12-24 ENCOUNTER — Ambulatory Visit (INDEPENDENT_AMBULATORY_CARE_PROVIDER_SITE_OTHER): Payer: Medicare Other | Admitting: Internal Medicine

## 2014-12-24 ENCOUNTER — Encounter: Payer: Self-pay | Admitting: Internal Medicine

## 2014-12-24 VITALS — BP 158/84 | HR 98 | Ht 64.0 in | Wt 185.8 lb

## 2014-12-24 DIAGNOSIS — M25511 Pain in right shoulder: Secondary | ICD-10-CM | POA: Diagnosis not present

## 2014-12-24 DIAGNOSIS — I480 Paroxysmal atrial fibrillation: Secondary | ICD-10-CM

## 2014-12-24 MED ORDER — DIGOXIN 125 MCG PO TABS: 0.1250 mg | ORAL_TABLET | Freq: Every day | ORAL | Status: DC

## 2014-12-24 NOTE — Progress Notes (Signed)
HPI [name] returns today for followup. She is a very pleasant 81 year old woman with paroxysmal atrial fibrillation, hypertension, dyslipidemia, and chronic anticoagulation with Coumadin. In the interim she has lost her husband and has had some worsening of her tremor. She denies cough, chest pain, or nausea or vomiting. Allergies  Allergen Reactions  . Oxycodone-Acetaminophen Nausea Only     Current Outpatient Prescriptions  Medication Sig Dispense Refill  . acetaminophen (TYLENOL) 500 MG tablet Take 1,000 mg by mouth every 6 (six) hours as needed. For pain    . albuterol (PROVENTIL HFA;VENTOLIN HFA) 108 (90 BASE) MCG/ACT inhaler Inhale 2 puffs into the lungs every 4 (four) hours as needed for wheezing or shortness of breath. 1 Inhaler 0  . ALPRAZolam (XANAX) 0.25 MG tablet Take 1 tablet (0.25 mg total) by mouth every 6 (six) hours as needed. (Patient taking differently: Take 0.25 mg by mouth every 6 (six) hours as needed for anxiety. ) 30 tablet 2  . amiodarone (PACERONE) 200 MG tablet Take 1 tablet once daily except for Friday, Saturday & Sunday only take 1/2 tablet 90 tablet 1  . atorvastatin (LIPITOR) 20 MG tablet TAKE 1 TABLET AT BEDTIME 90 tablet 3  . Calcium Carbonate-Vitamin D (CALCIUM-VITAMIN D) 500-200 MG-UNIT per tablet Take 1 tablet by mouth daily.      . hydrochlorothiazide (HYDRODIURIL) 25 MG tablet TAKE 1 TABLET (25 MG TOTAL) BY MOUTH DAILY. 90 tablet 3  . Hydrocortisone (GERHARDT'S BUTT CREAM) CREA Apply 1 application topically as needed for irritation.    . metoprolol (LOPRESSOR) 50 MG tablet TAKE 0.5 TABLETS (25 MG TOTAL) BY MOUTH 2 (TWO) TIMES DAILY. 90 tablet 3  . Multiple Vitamins-Minerals (MULTIVITAMIN WITH MINERALS) tablet Take 1 tablet by mouth daily.      Marland Kitchen warfarin (COUMADIN) 2.5 MG tablet Take as directed by the coumadin clinic 30 tablet 1   No current facility-administered medications for this visit.     Past Medical History  Diagnosis Date  . Aftercare  for healing traumatic fracture of arm, unspecified   . Malignant melanoma of skin of trunk, except scrotum   . Rosacea   . Sprain of lumbar region   . Sciatica     right  . Other postprocedural status(V45.89)   . Hyperlipidemia   . H/O: hysterectomy   . S/P cholecystectomy   . H/O: hemorrhoidectomy   . Hypertension   . A-fib     paroxysmal    ROS:   All systems reviewed and negative except as noted in the HPI.   Past Surgical History  Procedure Laterality Date  . Catheter ablation of atrail flutter    . Hernia repair    . Bladder suspension    . Abdominal hysterectomy    . Cholecystectomy    . Hemorrhoid surgery       History reviewed. No pertinent family history.   History   Social History  . Marital Status: Widowed    Spouse Name: N/A    Number of Children: 6  . Years of Education: 10   Occupational History  . telephone operator     retired   Social History Main Topics  . Smoking status: Never Smoker   . Smokeless tobacco: Never Used  . Alcohol Use: No  . Drug Use: No  . Sexual Activity: No   Other Topics Concern  . Not on file   Social History Narrative   10th grade. Married '51.4 sons- '52, '54, '58, '70; 2 daughters- '56, '64  older girl died of ovarian cancer.11 grandchildren, 2 great grands. Retired Primary school teacher. ACP - wishes full code status: CPR, mechanical ventilator and all heroic measures.      BP 158/84 mmHg  Pulse 98  Ht 5\' 4"  (1.626 m)  Wt 185 lb 12.8 oz (84.278 kg)  BMI 31.88 kg/m2  Physical Exam:  Well appearing 81 year old woman, NAD HEENT: Unremarkable Neck:  7 cm JVD, no thyromegally Lungs:  Clear with no wheezes, rales, or rhonchi. HEART:  Regular rate rhythm, no murmurs, no rubs, no clicks Abd:  soft, obese,positive bowel sounds, no organomegally, no rebound, no guarding Ext:  2 plus pulses, no edema, no cyanosis, no clubbing Skin:  No rashes no nodules Neuro:  CN II through XII intact, motor grossly  intact  ECG - normal sinus rhythm   Assess/Plan:

## 2014-12-24 NOTE — Assessment & Plan Note (Signed)
Hopefully this will improve with stopping of amiodarone.

## 2014-12-24 NOTE — Assessment & Plan Note (Signed)
Her systolic blood pressure is elevated. She will continue her current meds. I encouraged her to reduce her sodium intake. She is anxious.

## 2014-12-24 NOTE — Patient Instructions (Signed)
Your physician has recommended you make the following change in your medication:  1) Start digoxin 0.125 mg one tablet by mouth once daily 2) Stop amiodarone  Please contact the practice where your coumadin is followed and let them know that Dr. Lovena Le has stopped your amiodarone.  Your physician recommends that you have lab work today: BMP  Your physician recommends that you return for lab work in: 2-3 weeks - Digoxin level  Nurse visit in 2-3 weeks- EKG (same day as lab work)  You have been referred to : [place]- right shoulder pain.  Your physician recommends that you schedule a follow-up appointment in: 3 months with Dr. Lovena Le.

## 2014-12-24 NOTE — Assessment & Plan Note (Signed)
She has been to physical therapy in the past at [place]. Will refer for evaluation and treatment of the right shoulder.

## 2014-12-24 NOTE — Assessment & Plan Note (Signed)
She has reverted back to atrial fib. After an extensive discussion of the treatment options with the patient and her daughter, I have recommended she go back to a trial of rate control. She has had problems with amiodarone with tremor and vision and she did not know that she was out of rhythm. Will stop amiodarone, continue metoprolol and start digoxin. I would like her to return for a digoxin level and ecg in 3 weeks.

## 2014-12-25 LAB — BASIC METABOLIC PANEL
BUN: 15 mg/dL (ref 6–23)
CO2: 21 mEq/L (ref 19–32)
Calcium: 10.2 mg/dL (ref 8.4–10.5)
Chloride: 110 mEq/L (ref 96–112)
Creatinine, Ser: 0.82 mg/dL (ref 0.40–1.20)
GFR: 71.07 mL/min (ref >60.00)
Glucose, Bld: 119 mg/dL — ABNORMAL HIGH (ref 70–99)
Potassium: 4.4 mEq/L (ref 3.5–5.1)
Sodium: 146 mEq/L — ABNORMAL HIGH (ref 135–145)

## 2015-01-05 ENCOUNTER — Encounter: Payer: Self-pay | Admitting: Internal Medicine

## 2015-01-06 ENCOUNTER — Ambulatory Visit (INDEPENDENT_AMBULATORY_CARE_PROVIDER_SITE_OTHER): Payer: Medicare Other | Admitting: General Practice

## 2015-01-06 DIAGNOSIS — I4891 Unspecified atrial fibrillation: Secondary | ICD-10-CM | POA: Diagnosis not present

## 2015-01-06 DIAGNOSIS — Z5181 Encounter for therapeutic drug level monitoring: Secondary | ICD-10-CM

## 2015-01-06 LAB — POCT INR: INR: 2.1

## 2015-01-06 NOTE — Progress Notes (Signed)
Agree with plan 

## 2015-01-06 NOTE — Progress Notes (Signed)
Pre visit review using our clinic review tool, if applicable. No additional management support is needed unless otherwise documented below in the visit note. 

## 2015-01-07 ENCOUNTER — Other Ambulatory Visit (INDEPENDENT_AMBULATORY_CARE_PROVIDER_SITE_OTHER): Payer: Medicare Other | Admitting: *Deleted

## 2015-01-07 DIAGNOSIS — I480 Paroxysmal atrial fibrillation: Secondary | ICD-10-CM | POA: Diagnosis not present

## 2015-01-07 DIAGNOSIS — I4891 Unspecified atrial fibrillation: Secondary | ICD-10-CM | POA: Diagnosis not present

## 2015-01-07 NOTE — Addendum Note (Signed)
Addended by: Eulis Foster on: 01/07/2015 02:24 PM   Modules accepted: Orders

## 2015-01-08 DIAGNOSIS — M7541 Impingement syndrome of right shoulder: Secondary | ICD-10-CM | POA: Diagnosis not present

## 2015-01-08 LAB — DIGOXIN LEVEL: Digoxin Level: 1.5 ng/mL (ref 0.8–2.0)

## 2015-01-09 ENCOUNTER — Other Ambulatory Visit: Payer: Self-pay | Admitting: Internal Medicine

## 2015-01-11 ENCOUNTER — Other Ambulatory Visit: Payer: Self-pay | Admitting: Geriatric Medicine

## 2015-01-15 ENCOUNTER — Telehealth: Payer: Self-pay | Admitting: Internal Medicine

## 2015-01-15 MED ORDER — CIPROFLOXACIN HCL 500 MG PO TABS: 500.0000 mg | ORAL_TABLET | Freq: Two times a day (BID) | ORAL | Status: DC

## 2015-01-15 NOTE — Telephone Encounter (Signed)
Please advise, thanks.

## 2015-01-15 NOTE — Telephone Encounter (Signed)
Pt called in said that she is going a trip to [place] and [place].  She wanted to know if she could get something for Travel Diarrhea?

## 2015-01-15 NOTE — Telephone Encounter (Signed)
Patient aware and will go pick up medication.

## 2015-01-15 NOTE — Telephone Encounter (Signed)
Have sent in an rx for ciprofloxacin. If she gets diarrhea she should start taking 1 pill twice a day until gone.

## 2015-02-03 ENCOUNTER — Ambulatory Visit (INDEPENDENT_AMBULATORY_CARE_PROVIDER_SITE_OTHER): Payer: Medicare Other | Admitting: General Practice

## 2015-02-03 DIAGNOSIS — Z7901 Long term (current) use of anticoagulants: Secondary | ICD-10-CM | POA: Diagnosis not present

## 2015-02-03 LAB — POCT INR: INR: 3.8

## 2015-02-03 NOTE — Progress Notes (Signed)
Pre visit review using our clinic review tool, if applicable. No additional management support is needed unless otherwise documented below in the visit note. 

## 2015-02-03 NOTE — Progress Notes (Signed)
Agree with plan 

## 2015-02-07 ENCOUNTER — Other Ambulatory Visit: Payer: Self-pay | Admitting: Internal Medicine

## 2015-02-10 ENCOUNTER — Encounter: Payer: Self-pay | Admitting: Internal Medicine

## 2015-02-10 ENCOUNTER — Ambulatory Visit (INDEPENDENT_AMBULATORY_CARE_PROVIDER_SITE_OTHER): Payer: Medicare Other | Admitting: Internal Medicine

## 2015-02-10 VITALS — BP 124/70 | HR 83 | Ht 65.0 in | Wt 183.4 lb

## 2015-02-10 DIAGNOSIS — R251 Tremor, unspecified: Secondary | ICD-10-CM

## 2015-02-10 DIAGNOSIS — I480 Paroxysmal atrial fibrillation: Secondary | ICD-10-CM | POA: Diagnosis not present

## 2015-02-10 DIAGNOSIS — I10 Essential (primary) hypertension: Secondary | ICD-10-CM | POA: Diagnosis not present

## 2015-02-10 MED ORDER — DIGOXIN 125 MCG PO TABS: 0.1250 mg | ORAL_TABLET | Freq: Every day | ORAL | Status: DC

## 2015-02-10 NOTE — Assessment & Plan Note (Signed)
She now has persistent atrial fibrillation. We discussed the possibility of additional antiarrhythmic drug therapy, but instead have decided to proceed with rate control strategy. She will continue her current medications.

## 2015-02-10 NOTE — Progress Notes (Signed)
HPI [name] returns today for followup. She is a very pleasant 81 year old woman with paroxysmal atrial fibrillation, hypertension, dyslipidemia, and chronic anticoagulation with Coumadin. In the interim she has lost her husband. We noted that she had been experiencing worsening of her tremor and had her hold her amiodarone but her tremor is unchanged. She denies cough, chest pain, or nausea or vomiting. She does not feel much in the way of palpitations.  Allergies  Allergen Reactions  . Antihistamines, Diphenhydramine-Type     ALL ANTIHISTAMINES causes her heart to race  . Oxycodone-Acetaminophen Nausea Only     Current Outpatient Prescriptions  Medication Sig Dispense Refill  . acetaminophen (TYLENOL) 500 MG tablet Take 1,000 mg by mouth every 6 (six) hours as needed. For pain    . ALPRAZolam (XANAX) 0.25 MG tablet Take 1 tablet (0.25 mg total) by mouth every 6 (six) hours as needed. (Patient taking differently: Take 0.25 mg by mouth every 6 (six) hours as needed for anxiety. ) 30 tablet 2  . atorvastatin (LIPITOR) 20 MG tablet TAKE 1 TABLET AT BEDTIME (Patient taking differently: TAKE 1 TABLET BY MOUTH AT BEDTIME) 90 tablet 3  . Calcium Carbonate-Vitamin D (CALCIUM-VITAMIN D) 500-200 MG-UNIT per tablet Take 1 tablet by mouth daily.      . digoxin (LANOXIN) 0.125 MG tablet Take 1 tablet (0.125 mg total) by mouth daily. 30 tablet 3  . hydrochlorothiazide (HYDRODIURIL) 25 MG tablet TAKE 1 TABLET (25 MG TOTAL) BY MOUTH DAILY. 90 tablet 3  . Hydrocortisone (GERHARDT'S BUTT CREAM) CREA Apply 1 application topically daily as needed for irritation.     . metoprolol (LOPRESSOR) 50 MG tablet TAKE 0.5 TABLETS (25 MG TOTAL) BY MOUTH 2 (TWO) TIMES DAILY. 90 tablet 0  . Multiple Vitamins-Minerals (MULTIVITAMIN WITH MINERALS) tablet Take 1 tablet by mouth daily.      Marland Kitchen warfarin (COUMADIN) 2.5 MG tablet TAKE AS DIRECTED BY THE COUMADIN CLINIC 30 tablet 1  . ciprofloxacin (CIPRO) 500 MG tablet Take 1  tablet (500 mg total) by mouth 2 (two) times daily. (Patient not taking: Reported on 02/10/2015) 10 tablet 0   No current facility-administered medications for this visit.     Past Medical History  Diagnosis Date  . Aftercare for healing traumatic fracture of arm, unspecified   . Malignant melanoma of skin of trunk, except scrotum   . Rosacea   . Sprain of lumbar region   . Sciatica     right  . Other postprocedural status(V45.89)   . Hyperlipidemia   . H/O: hysterectomy   . S/P cholecystectomy   . H/O: hemorrhoidectomy   . Hypertension   . A-fib     paroxysmal    ROS:   All systems reviewed and negative except as noted in the HPI.   Past Surgical History  Procedure Laterality Date  . Catheter ablation of atrail flutter    . Hernia repair    . Bladder suspension    . Abdominal hysterectomy    . Cholecystectomy    . Hemorrhoid surgery       Family History  Problem Relation Age of Onset  . Heart failure Mother   . Heart failure Father   . Liver cancer Daughter      History   Social History  . Marital Status: Widowed    Spouse Name: N/A  . Number of Children: 6  . Years of Education: 10   Occupational History  . telephone operator     retired  Social History Main Topics  . Smoking status: Never Smoker   . Smokeless tobacco: Never Used  . Alcohol Use: No  . Drug Use: No  . Sexual Activity: No   Other Topics Concern  . Not on file   Social History Narrative   10th grade. Married '51.4 sons- '52, '54, '58, '70; 2 daughters- '56, '64 older girl died of ovarian cancer.11 grandchildren, 2 great grands. Retired Primary school teacher. ACP - wishes full code status: CPR, mechanical ventilator and all heroic measures.      BP 124/70 mmHg  Pulse 83  Ht 5\' 5"  (1.651 m)  Wt 183 lb 6.4 oz (83.19 kg)  BMI 30.52 kg/m2  Physical Exam:  Well appearing 81 year old woman, NAD HEENT: Unremarkable Neck:  7 cm JVD, no thyromegally Lungs:  Clear with no  wheezes, rales, or rhonchi. HEART:  Regular rate rhythm, no murmurs, no rubs, no clicks Abd:  soft, obese,positive bowel sounds, no organomegally, no rebound, no guarding Ext:  2 plus pulses, no edema, no cyanosis, no clubbing Skin:  No rashes no nodules Neuro:  CN II through XII intact, motor grossly intact  ECG - atrial fib with a controlled VR  Assess/Plan:

## 2015-02-10 NOTE — Patient Instructions (Signed)
Your physician recommends that you continue on your current medications as directed. Please refer to the Current Medication list given to you today.  Your physician wants you to follow-up in: January 2017 with Dr. Lovena Le.  You will receive a reminder letter in the mail two months in advance. If you don't receive a letter, please call our office to schedule the follow-up appointment.

## 2015-02-10 NOTE — Assessment & Plan Note (Signed)
Her tremor does not appear to have improved with discontinuation of amiodarone. She will follow-up as needed with her primary physician and neurologist.

## 2015-02-10 NOTE — Assessment & Plan Note (Signed)
Her blood pressure is well controlled. No change in medications this time. She will maintain a low-sodium diet.

## 2015-02-12 DIAGNOSIS — M7541 Impingement syndrome of right shoulder: Secondary | ICD-10-CM | POA: Diagnosis not present

## 2015-03-10 ENCOUNTER — Ambulatory Visit (INDEPENDENT_AMBULATORY_CARE_PROVIDER_SITE_OTHER): Payer: Medicare Other | Admitting: General Practice

## 2015-03-10 DIAGNOSIS — Z7901 Long term (current) use of anticoagulants: Secondary | ICD-10-CM

## 2015-03-10 LAB — POCT INR: INR: 2.7

## 2015-03-10 NOTE — Progress Notes (Signed)
Agree with plan 

## 2015-03-10 NOTE — Progress Notes (Signed)
Pre visit review using our clinic review tool, if applicable. No additional management support is needed unless otherwise documented below in the visit note. 

## 2015-03-11 ENCOUNTER — Other Ambulatory Visit: Payer: Self-pay | Admitting: Internal Medicine

## 2015-04-05 ENCOUNTER — Other Ambulatory Visit: Payer: Self-pay | Admitting: Internal Medicine

## 2015-04-07 ENCOUNTER — Other Ambulatory Visit: Payer: Self-pay | Admitting: Internal Medicine

## 2015-04-07 ENCOUNTER — Ambulatory Visit (INDEPENDENT_AMBULATORY_CARE_PROVIDER_SITE_OTHER): Payer: Medicare Other | Admitting: General Practice

## 2015-04-07 DIAGNOSIS — Z7901 Long term (current) use of anticoagulants: Secondary | ICD-10-CM | POA: Diagnosis not present

## 2015-04-07 LAB — POCT INR: INR: 2.7

## 2015-04-07 NOTE — Progress Notes (Signed)
Pre visit review using our clinic review tool, if applicable. No additional management support is needed unless otherwise documented below in the visit note. 

## 2015-04-07 NOTE — Progress Notes (Signed)
Agree with plan 

## 2015-05-05 ENCOUNTER — Ambulatory Visit (INDEPENDENT_AMBULATORY_CARE_PROVIDER_SITE_OTHER): Payer: Medicare Other | Admitting: *Deleted

## 2015-05-05 DIAGNOSIS — Z7901 Long term (current) use of anticoagulants: Secondary | ICD-10-CM | POA: Diagnosis not present

## 2015-05-05 LAB — POCT INR: INR: 2.9

## 2015-05-05 NOTE — Progress Notes (Signed)
Pre visit review using our clinic review tool, if applicable. No additional management support is needed unless otherwise documented below in the visit note. 

## 2015-05-05 NOTE — Progress Notes (Signed)
I have reviewed and agree with the plan. 

## 2015-05-24 ENCOUNTER — Other Ambulatory Visit: Payer: Self-pay

## 2015-05-30 ENCOUNTER — Other Ambulatory Visit: Payer: Self-pay | Admitting: Internal Medicine

## 2015-06-02 ENCOUNTER — Ambulatory Visit (INDEPENDENT_AMBULATORY_CARE_PROVIDER_SITE_OTHER): Payer: Medicare Other | Admitting: General Practice

## 2015-06-02 DIAGNOSIS — Z7901 Long term (current) use of anticoagulants: Secondary | ICD-10-CM

## 2015-06-02 LAB — POCT INR: INR: 2.1

## 2015-06-02 NOTE — Progress Notes (Signed)
Pre visit review using our clinic review tool, if applicable. No additional management support is needed unless otherwise documented below in the visit note. 

## 2015-06-02 NOTE — Progress Notes (Signed)
I have reviewed and agree with the plan. 

## 2015-07-04 ENCOUNTER — Other Ambulatory Visit: Payer: Self-pay | Admitting: Internal Medicine

## 2015-07-05 ENCOUNTER — Other Ambulatory Visit: Payer: Self-pay

## 2015-07-05 MED ORDER — METOPROLOL TARTRATE 50 MG PO TABS: ORAL_TABLET | ORAL | Status: DC

## 2015-07-14 ENCOUNTER — Ambulatory Visit: Payer: Medicare Other

## 2015-07-21 ENCOUNTER — Ambulatory Visit (INDEPENDENT_AMBULATORY_CARE_PROVIDER_SITE_OTHER): Payer: Medicare Other | Admitting: General Practice

## 2015-07-21 DIAGNOSIS — Z7901 Long term (current) use of anticoagulants: Secondary | ICD-10-CM

## 2015-07-21 LAB — POCT INR: INR: 2.4

## 2015-07-21 NOTE — Progress Notes (Signed)
Pre visit review using our clinic review tool, if applicable. No additional management support is needed unless otherwise documented below in the visit note. 

## 2015-07-21 NOTE — Progress Notes (Signed)
I have reviewed and agree with the plan. 

## 2015-07-28 ENCOUNTER — Other Ambulatory Visit: Payer: Self-pay | Admitting: Internal Medicine

## 2015-08-11 ENCOUNTER — Other Ambulatory Visit: Payer: Self-pay | Admitting: Internal Medicine

## 2015-08-13 ENCOUNTER — Encounter: Payer: Self-pay | Admitting: Internal Medicine

## 2015-08-13 ENCOUNTER — Other Ambulatory Visit (INDEPENDENT_AMBULATORY_CARE_PROVIDER_SITE_OTHER): Payer: Medicare Other

## 2015-08-13 ENCOUNTER — Ambulatory Visit (INDEPENDENT_AMBULATORY_CARE_PROVIDER_SITE_OTHER): Payer: Medicare Other | Admitting: Internal Medicine

## 2015-08-13 VITALS — BP 150/88 | HR 119 | Temp 98.5°F | Resp 14 | Ht 64.0 in | Wt 168.8 lb

## 2015-08-13 DIAGNOSIS — Z0000 Encounter for general adult medical examination without abnormal findings: Secondary | ICD-10-CM | POA: Insufficient documentation

## 2015-08-13 DIAGNOSIS — I4891 Unspecified atrial fibrillation: Secondary | ICD-10-CM | POA: Diagnosis not present

## 2015-08-13 DIAGNOSIS — I10 Essential (primary) hypertension: Secondary | ICD-10-CM | POA: Diagnosis not present

## 2015-08-13 DIAGNOSIS — E785 Hyperlipidemia, unspecified: Secondary | ICD-10-CM | POA: Diagnosis not present

## 2015-08-13 DIAGNOSIS — L989 Disorder of the skin and subcutaneous tissue, unspecified: Secondary | ICD-10-CM | POA: Diagnosis not present

## 2015-08-13 DIAGNOSIS — Z23 Encounter for immunization: Secondary | ICD-10-CM

## 2015-08-13 DIAGNOSIS — R251 Tremor, unspecified: Secondary | ICD-10-CM

## 2015-08-13 DIAGNOSIS — R7301 Impaired fasting glucose: Secondary | ICD-10-CM

## 2015-08-13 LAB — COMPREHENSIVE METABOLIC PANEL
ALT: 23 U/L (ref 0–35)
AST: 24 U/L (ref 0–37)
Albumin: 4.2 g/dL (ref 3.5–5.2)
Alkaline Phosphatase: 99 U/L (ref 39–117)
BUN: 13 mg/dL (ref 6–23)
CO2: 29 mEq/L (ref 19–32)
Calcium: 10 mg/dL (ref 8.4–10.5)
Chloride: 101 mEq/L (ref 96–112)
Creatinine, Ser: 0.63 mg/dL (ref 0.40–1.20)
GFR: 96.19 mL/min (ref >60.00)
Glucose, Bld: 125 mg/dL — ABNORMAL HIGH (ref 70–99)
Potassium: 3.9 mEq/L (ref 3.5–5.1)
Sodium: 140 mEq/L (ref 135–145)
Total Bilirubin: 2.8 mg/dL — ABNORMAL HIGH (ref 0.2–1.2)
Total Protein: 7.5 g/dL (ref 6.0–8.3)

## 2015-08-13 LAB — LIPID PANEL
Cholesterol: 149 mg/dL (ref 0–200)
HDL: 53.4 mg/dL (ref >39.00)
LDL Cholesterol: 67 mg/dL (ref 0–99)
NonHDL: 95.1
Total CHOL/HDL Ratio: 3
Triglycerides: 139 mg/dL (ref 0.0–149.0)
VLDL: 27.8 mg/dL (ref 0.0–40.0)

## 2015-08-13 LAB — HEMOGLOBIN A1C: Hgb A1c MFr Bld: 6.6 % — ABNORMAL HIGH (ref 4.6–6.5)

## 2015-08-13 NOTE — Assessment & Plan Note (Signed)
Checking lipid panel today and adjust regimen as needed. Currently taking lipitor 20 mg daily without side effects.

## 2015-08-13 NOTE — Assessment & Plan Note (Signed)
HR initially up some at visit but below goal after resting several minutes. Some history of bradycardia so reluctant to increase metoprolol at this time. Also on coumadin for anticoagulation, digoxin (checking level today).

## 2015-08-13 NOTE — Assessment & Plan Note (Signed)
Flu and prevnar shots given today. Aged out of colonoscopy and mammogram up to date. Talked to her about fall prevention and skin cancer screening and prevention. 10 year of screening recommendations given to her at visit.

## 2015-08-13 NOTE — Assessment & Plan Note (Signed)
Prior history of skin cancer. Will refer back to dermatology for the lesion on her nose. Several other moles and spots that warrant screening skin exam.

## 2015-08-13 NOTE — Progress Notes (Signed)
   Subjective:    Patient ID: [name] [name] [facility], female    DOB: 09-06-1931, 81 y.o.   MRN: [id number]  HPI Here for medicare wellness. Please see A/P for status and treatment of chronic medical problems.   HPI #2: Here for acute concern for skin lesion on the nose which is growing over the last 6 months. She thinks she scratched it on her glasses but it has not gone away. Has not tried anything for it. Has not seen a dermatologist in some time.   Diet: heart healthy Physical activity: sedentary Depression/mood screen: negative Hearing: intact to whispered voice Visual acuity: grossly normal, performs annual eye exam  ADLs: capable Fall risk: none Home safety: good Cognitive evaluation: intact to orientation, naming, recall and repetition EOL planning: adv directives discussed  I have personally reviewed and have noted 1. The patient's medical and social history - reviewed today no changes 2. Their use of alcohol, tobacco or illicit drugs 3. Their current medications and supplements 4. The patient's functional ability including ADL's, fall risks, home safety risks and hearing or visual impairment. 5. Diet and physical activities 6. Evidence for depression or mood disorders 7. Care team reviewed and updated (available in snapshot)  Review of Systems  Constitutional: Negative for fever, activity change, appetite change, fatigue and unexpected weight change.  HENT: Positive for congestion. Negative for ear pain, mouth sores and nosebleeds.   Eyes: Negative.   Respiratory: Negative for cough, chest tightness, shortness of breath and wheezing.   Cardiovascular: Negative for chest pain, palpitations and leg swelling.  Gastrointestinal: Negative for nausea, abdominal pain, diarrhea, constipation, blood in stool, abdominal distention and anal bleeding.  Musculoskeletal: Positive for back pain and arthralgias. Negative for myalgias and gait problem.  Skin: Negative for color change, rash and  wound.       Skin lesion on nose  Neurological: Negative for dizziness, syncope, weakness, light-headedness, numbness and headaches.  Psychiatric/Behavioral: Negative.       Objective:   Physical Exam  Constitutional: She is oriented to person, place, and time. She appears well-developed and well-nourished.  HENT:  Head: Normocephalic and atraumatic.  Eyes: EOM are normal.  Neck: Normal range of motion.  Cardiovascular: Normal rate.   Irreg irreg  Pulmonary/Chest: Effort normal and breath sounds normal. No respiratory distress. She has no wheezes. She has no rales.  Abdominal: Soft. Bowel sounds are normal. She exhibits no distension. There is no tenderness. There is no rebound.  Some umbilical hernia <9DG above the umbilicus.   Musculoskeletal: She exhibits no edema.  Neurological: She is alert and oriented to person, place, and time. Coordination normal.  Skin: Skin is warm and dry.  Psychiatric: She has a normal mood and affect.   Filed Vitals:   08/13/15 1440  BP: 150/88  Pulse: 119  Temp: 98.5 F (36.9 C)  TempSrc: Oral  Resp: 14  Height: 5\' 4"  (1.626 m)  Weight: 168 lb 12.8 oz (76.567 kg)  SpO2: 97%      Assessment & Plan:  Prevnar and flu shot given at visit.

## 2015-08-13 NOTE — Assessment & Plan Note (Signed)
Currently on HCTZ and metoprolol. Checking CMP and adjust as needed. BP initially high but recheck lower.

## 2015-08-13 NOTE — Progress Notes (Signed)
Pre visit review using our clinic review tool, if applicable. No additional management support is needed unless otherwise documented below in the visit note. 

## 2015-08-13 NOTE — Patient Instructions (Addendum)
We will check the blood work today and call you back with the results.   We will get you in to the dermatologist for the area on your nose.   The spot on your side looks fine.   Health Maintenance Adopting a healthy lifestyle and getting preventive care can go a long way to promote health and wellness. Talk with your health care provider about what schedule of regular examinations is right for you. This is a good chance for you to check in with your provider about disease prevention and staying healthy. In between checkups, there are plenty of things you can do on your own. Experts have done a lot of research about which lifestyle changes and preventive measures are most likely to keep you healthy. Ask your health care provider for more information. WEIGHT AND DIET  Eat a healthy diet  Be sure to include plenty of vegetables, fruits, low-fat dairy products, and lean protein.  Do not eat a lot of foods high in solid fats, added sugars, or salt.  Get regular exercise. This is one of the most important things you can do for your health.  Most adults should exercise for at least 150 minutes each week. The exercise should increase your heart rate and make you sweat (moderate-intensity exercise).  Most adults should also do strengthening exercises at least twice a week. This is in addition to the moderate-intensity exercise.  Maintain a healthy weight  Body mass index (BMI) is a measurement that can be used to identify possible weight problems. It estimates body fat based on height and weight. Your health care provider can help determine your BMI and help you achieve or maintain a healthy weight.  For females 11 years of age and older:   A BMI below 18.5 is considered underweight.  A BMI of 18.5 to 24.9 is normal.  A BMI of 25 to 29.9 is considered overweight.  A BMI of 30 and above is considered obese.  Watch levels of cholesterol and blood lipids  You should start having your blood  tested for lipids and cholesterol at 79 years of age, then have this test every 5 years.  You may need to have your cholesterol levels checked more often if:  Your lipid or cholesterol levels are high.  You are older than 79 years of age.  You are at high risk for heart disease.  CANCER SCREENING   Lung Cancer  Lung cancer screening is recommended for adults 65-79 years old who are at high risk for lung cancer because of a history of smoking.  A yearly low-dose CT scan of the lungs is recommended for people who:  Currently smoke.  Have quit within the past 15 years.  Have at least a 30-pack-year history of smoking. A pack year is smoking an average of one pack of cigarettes a day for 1 year.  Yearly screening should continue until it has been 15 years since you quit.  Yearly screening should stop if you develop a health problem that would prevent you from having lung cancer treatment.  Breast Cancer  Practice breast self-awareness. This means understanding how your breasts normally appear and feel.  It also means doing regular breast self-exams. Let your health care provider know about any changes, no matter how small.  If you are in your 20s or 30s, you should have a clinical breast exam (CBE) by a health care provider every 1-3 years as part of a regular health exam.  If you  are 91 or older, have a CBE every year. Also consider having a breast X-ray (mammogram) every year.  If you have a family history of breast cancer, talk to your health care provider about genetic screening.  If you are at high risk for breast cancer, talk to your health care provider about having an MRI and a mammogram every year.  Breast cancer gene (BRCA) assessment is recommended for women who have family members with BRCA-related cancers. BRCA-related cancers include:  Breast.  Ovarian.  Tubal.  Peritoneal cancers.  Results of the assessment will determine the need for genetic counseling  and BRCA1 and BRCA2 testing. Cervical Cancer Routine pelvic examinations to screen for cervical cancer are no longer recommended for nonpregnant women who are considered low risk for cancer of the pelvic organs (ovaries, uterus, and vagina) and who do not have symptoms. A pelvic examination may be necessary if you have symptoms including those associated with pelvic infections. Ask your health care provider if a screening pelvic exam is right for you.   The Pap test is the screening test for cervical cancer for women who are considered at risk.  If you had a hysterectomy for a problem that was not cancer or a condition that could lead to cancer, then you no longer need Pap tests.  If you are older than 65 years, and you have had normal Pap tests for the past 10 years, you no longer need to have Pap tests.  If you have had past treatment for cervical cancer or a condition that could lead to cancer, you need Pap tests and screening for cancer for at least 20 years after your treatment.  If you no longer get a Pap test, assess your risk factors if they change (such as having a new sexual partner). This can affect whether you should start being screened again.  Some women have medical problems that increase their chance of getting cervical cancer. If this is the case for you, your health care provider may recommend more frequent screening and Pap tests.  The human papillomavirus (HPV) test is another test that may be used for cervical cancer screening. The HPV test looks for the virus that can cause cell changes in the cervix. The cells collected during the Pap test can be tested for HPV.  The HPV test can be used to screen women 59 years of age and older. Getting tested for HPV can extend the interval between normal Pap tests from three to five years.  An HPV test also should be used to screen women of any age who have unclear Pap test results.  After 79 years of age, women should have HPV testing  as often as Pap tests.  Colorectal Cancer  This type of cancer can be detected and often prevented.  Routine colorectal cancer screening usually begins at 79 years of age and continues through 79 years of age.  Your health care provider may recommend screening at an earlier age if you have risk factors for colon cancer.  Your health care provider may also recommend using home test kits to check for hidden blood in the stool.  A small camera at the end of a tube can be used to examine your colon directly (sigmoidoscopy or colonoscopy). This is done to check for the earliest forms of colorectal cancer.  Routine screening usually begins at age 41.  Direct examination of the colon should be repeated every 5-10 years through 79 years of age. However, you may  need to be screened more often if early forms of precancerous polyps or small growths are found. Skin Cancer  Check your skin from head to toe regularly.  Tell your health care provider about any new moles or changes in moles, especially if there is a change in a mole's shape or color.  Also tell your health care provider if you have a mole that is larger than the size of a pencil eraser.  Always use sunscreen. Apply sunscreen liberally and repeatedly throughout the day.  Protect yourself by wearing long sleeves, pants, a wide-brimmed hat, and sunglasses whenever you are outside. HEART DISEASE, DIABETES, AND HIGH BLOOD PRESSURE   Have your blood pressure checked at least every 1-2 years. High blood pressure causes heart disease and increases the risk of stroke.  If you are between 31 years and 9 years old, ask your health care provider if you should take aspirin to prevent strokes.  Have regular diabetes screenings. This involves taking a blood sample to check your fasting blood sugar level.  If you are at a normal weight and have a low risk for diabetes, have this test once every three years after 79 years of age.  If you are  overweight and have a high risk for diabetes, consider being tested at a younger age or more often. PREVENTING INFECTION  Hepatitis B  If you have a higher risk for hepatitis B, you should be screened for this virus. You are considered at high risk for hepatitis B if:  You were born in a country where hepatitis B is common. Ask your health care provider which countries are considered high risk.  Your parents were born in a high-risk country, and you have not been immunized against hepatitis B (hepatitis B vaccine).  You have HIV or AIDS.  You use needles to inject street drugs.  You live with someone who has hepatitis B.  You have had sex with someone who has hepatitis B.  You get hemodialysis treatment.  You take certain medicines for conditions, including cancer, organ transplantation, and autoimmune conditions. Hepatitis C  Blood testing is recommended for:  Everyone born from [birth year] through 1963.  Anyone with known risk factors for hepatitis C. Sexually transmitted infections (STIs)  You should be screened for sexually transmitted infections (STIs) including gonorrhea and chlamydia if:  You are sexually active and are younger than 80 years of age.  You are older than 79 years of age and your health care provider tells you that you are at risk for this type of infection.  Your sexual activity has changed since you were last screened and you are at an increased risk for chlamydia or gonorrhea. Ask your health care provider if you are at risk.  If you do not have HIV, but are at risk, it may be recommended that you take a prescription medicine daily to prevent HIV infection. This is called pre-exposure prophylaxis (PrEP). You are considered at risk if:  You are sexually active and do not regularly use condoms or know the HIV status of your partner(s).  You take drugs by injection.  You are sexually active with a partner who has HIV. Talk with your health care provider  about whether you are at high risk of being infected with HIV. If you choose to begin PrEP, you should first be tested for HIV. You should then be tested every 3 months for as long as you are taking PrEP.  PREGNANCY   If you are  premenopausal and you may become pregnant, ask your health care provider about preconception counseling.  If you may become pregnant, take 400 to 800 micrograms (mcg) of folic acid every day.  If you want to prevent pregnancy, talk to your health care provider about birth control (contraception). OSTEOPOROSIS AND MENOPAUSE   Osteoporosis is a disease in which the bones lose minerals and strength with aging. This can result in serious bone fractures. Your risk for osteoporosis can be identified using a bone density scan.  If you are 62 years of age or older, or if you are at risk for osteoporosis and fractures, ask your health care provider if you should be screened.  Ask your health care provider whether you should take a calcium or vitamin D supplement to lower your risk for osteoporosis.  Menopause may have certain physical symptoms and risks.  Hormone replacement therapy may reduce some of these symptoms and risks. Talk to your health care provider about whether hormone replacement therapy is right for you.  HOME CARE INSTRUCTIONS   Schedule regular health, dental, and eye exams.  Stay current with your immunizations.   Do not use any tobacco products including cigarettes, chewing tobacco, or electronic cigarettes.  If you are pregnant, do not drink alcohol.  If you are breastfeeding, limit how much and how often you drink alcohol.  Limit alcohol intake to no more than 1 drink per day for nonpregnant women. One drink equals 12 ounces of beer, 5 ounces of wine, or 1 ounces of hard liquor.  Do not use street drugs.  Do not share needles.  Ask your health care provider for help if you need support or information about quitting drugs.  Tell your  health care provider if you often feel depressed.  Tell your health care provider if you have ever been abused or do not feel safe at home. Document Released: 05/29/2011 Document Revised: 03/30/2014 Document Reviewed: 10/15/2013 [facility] Patient Information 2015 [place], [place]. This information is not intended to replace advice given to you by your health care provider. Make sure you discuss any questions you have with your health care provider.

## 2015-08-13 NOTE — Assessment & Plan Note (Signed)
Some improvement with stopping the amiodarone. Will not add medicine at this time as the tremor is tolerable. If it worsens in the future can re-address.

## 2015-08-14 LAB — DIGOXIN LEVEL: Digoxin Level: 0.7 ug/L — ABNORMAL LOW (ref 0.8–2.0)

## 2015-09-01 ENCOUNTER — Ambulatory Visit: Payer: Medicare Other

## 2015-09-01 ENCOUNTER — Ambulatory Visit (INDEPENDENT_AMBULATORY_CARE_PROVIDER_SITE_OTHER): Payer: Medicare Other | Admitting: *Deleted

## 2015-09-01 DIAGNOSIS — Z7901 Long term (current) use of anticoagulants: Secondary | ICD-10-CM | POA: Diagnosis not present

## 2015-09-01 LAB — POCT INR: INR: 1.4

## 2015-09-01 NOTE — Progress Notes (Signed)
I have reviewed and agree with the plan. 

## 2015-09-01 NOTE — Progress Notes (Signed)
Pre visit review using our clinic review tool, if applicable. No additional management support is needed unless otherwise documented below in the visit note. 

## 2015-09-15 ENCOUNTER — Ambulatory Visit (INDEPENDENT_AMBULATORY_CARE_PROVIDER_SITE_OTHER): Payer: Medicare Other | Admitting: General Practice

## 2015-09-15 DIAGNOSIS — Z7901 Long term (current) use of anticoagulants: Secondary | ICD-10-CM

## 2015-09-15 LAB — POCT INR: INR: 1.8

## 2015-09-15 NOTE — Progress Notes (Signed)
Pre visit review using our clinic review tool, if applicable. No additional management support is needed unless otherwise documented below in the visit note. 

## 2015-09-15 NOTE — Progress Notes (Signed)
I have reviewed and agree with the plan. 

## 2015-09-20 ENCOUNTER — Other Ambulatory Visit: Payer: Self-pay | Admitting: Physician Assistant

## 2015-09-20 DIAGNOSIS — C44319 Basal cell carcinoma of skin of other parts of face: Secondary | ICD-10-CM | POA: Diagnosis not present

## 2015-09-20 DIAGNOSIS — L821 Other seborrheic keratosis: Secondary | ICD-10-CM | POA: Diagnosis not present

## 2015-09-22 ENCOUNTER — Other Ambulatory Visit: Payer: Self-pay | Admitting: Internal Medicine

## 2015-09-26 ENCOUNTER — Other Ambulatory Visit: Payer: Self-pay | Admitting: Internal Medicine

## 2015-09-29 ENCOUNTER — Other Ambulatory Visit: Payer: Self-pay | Admitting: Internal Medicine

## 2015-10-13 ENCOUNTER — Ambulatory Visit (INDEPENDENT_AMBULATORY_CARE_PROVIDER_SITE_OTHER): Payer: Medicare Other | Admitting: General Practice

## 2015-10-13 DIAGNOSIS — Z7901 Long term (current) use of anticoagulants: Secondary | ICD-10-CM

## 2015-10-13 LAB — POCT INR: INR: 1.4

## 2015-10-13 NOTE — Progress Notes (Signed)
I have reviewed and agree with the plan. 

## 2015-10-13 NOTE — Progress Notes (Signed)
Pre visit review using our clinic review tool, if applicable. No additional management support is needed unless otherwise documented below in the visit note. 

## 2015-10-18 ENCOUNTER — Telehealth: Payer: Self-pay | Admitting: Internal Medicine

## 2015-10-18 NOTE — Telephone Encounter (Signed)
Patient is going on a cruise after Thanksgiving.  She needs a letter stating that she is handicap so she can get a handicap room on the boat.  Patient is leaving December 3rd.

## 2015-10-18 NOTE — Telephone Encounter (Signed)
Printed and signed, call for pickup.

## 2015-10-20 ENCOUNTER — Ambulatory Visit (INDEPENDENT_AMBULATORY_CARE_PROVIDER_SITE_OTHER): Payer: Medicare Other | Admitting: General Practice

## 2015-10-20 ENCOUNTER — Telehealth: Payer: Self-pay

## 2015-10-20 DIAGNOSIS — Z7901 Long term (current) use of anticoagulants: Secondary | ICD-10-CM

## 2015-10-20 LAB — POCT INR: INR: 2.1

## 2015-10-20 MED ORDER — ALPRAZOLAM 0.25 MG PO TABS: 0.2500 mg | ORAL_TABLET | Freq: Every day | ORAL | Status: DC | PRN

## 2015-10-20 NOTE — Telephone Encounter (Signed)
rx faxed

## 2015-10-20 NOTE — Telephone Encounter (Signed)
Rf rq for alprazolam 0.25 mg tablet to [facility] on [address].   Please advise if rf is okay.

## 2015-10-20 NOTE — Telephone Encounter (Signed)
Printed and signed.  

## 2015-10-20 NOTE — Progress Notes (Signed)
I have reviewed and agree with the plan. 

## 2015-10-20 NOTE — Progress Notes (Signed)
Pre visit review using our clinic review tool, if applicable. No additional management support is needed unless otherwise documented below in the visit note. 

## 2015-10-24 ENCOUNTER — Other Ambulatory Visit: Payer: Self-pay | Admitting: Internal Medicine

## 2015-11-05 ENCOUNTER — Other Ambulatory Visit: Payer: Self-pay | Admitting: Internal Medicine

## 2015-11-17 ENCOUNTER — Ambulatory Visit (INDEPENDENT_AMBULATORY_CARE_PROVIDER_SITE_OTHER): Payer: Medicare Other | Admitting: General Practice

## 2015-11-17 DIAGNOSIS — Z7901 Long term (current) use of anticoagulants: Secondary | ICD-10-CM | POA: Diagnosis not present

## 2015-11-17 LAB — POCT INR: INR: 1.9

## 2015-11-17 NOTE — Progress Notes (Signed)
I have reviewed and agree with the plan. 

## 2015-11-17 NOTE — Progress Notes (Signed)
Pre visit review using our clinic review tool, if applicable. No additional management support is needed unless otherwise documented below in the visit note. 

## 2015-11-20 ENCOUNTER — Other Ambulatory Visit: Payer: Self-pay | Admitting: Internal Medicine

## 2015-11-25 DIAGNOSIS — L821 Other seborrheic keratosis: Secondary | ICD-10-CM | POA: Diagnosis not present

## 2015-11-25 DIAGNOSIS — D225 Melanocytic nevi of trunk: Secondary | ICD-10-CM | POA: Diagnosis not present

## 2015-11-25 DIAGNOSIS — Z85828 Personal history of other malignant neoplasm of skin: Secondary | ICD-10-CM | POA: Diagnosis not present

## 2015-11-25 DIAGNOSIS — D1801 Hemangioma of skin and subcutaneous tissue: Secondary | ICD-10-CM | POA: Diagnosis not present

## 2015-12-01 DIAGNOSIS — H5213 Myopia, bilateral: Secondary | ICD-10-CM | POA: Diagnosis not present

## 2015-12-01 DIAGNOSIS — H1849 Other corneal degeneration: Secondary | ICD-10-CM | POA: Diagnosis not present

## 2015-12-01 DIAGNOSIS — H2513 Age-related nuclear cataract, bilateral: Secondary | ICD-10-CM | POA: Diagnosis not present

## 2015-12-01 DIAGNOSIS — H43813 Vitreous degeneration, bilateral: Secondary | ICD-10-CM | POA: Diagnosis not present

## 2015-12-10 ENCOUNTER — Ambulatory Visit (INDEPENDENT_AMBULATORY_CARE_PROVIDER_SITE_OTHER): Payer: Medicare Other | Admitting: Internal Medicine

## 2015-12-10 ENCOUNTER — Encounter: Payer: Self-pay | Admitting: Internal Medicine

## 2015-12-10 VITALS — BP 132/80 | HR 107 | Ht 65.0 in | Wt 161.6 lb

## 2015-12-10 DIAGNOSIS — I482 Chronic atrial fibrillation, unspecified: Secondary | ICD-10-CM

## 2015-12-10 DIAGNOSIS — E785 Hyperlipidemia, unspecified: Secondary | ICD-10-CM

## 2015-12-10 DIAGNOSIS — R634 Abnormal weight loss: Secondary | ICD-10-CM | POA: Insufficient documentation

## 2015-12-10 DIAGNOSIS — I480 Paroxysmal atrial fibrillation: Secondary | ICD-10-CM | POA: Diagnosis not present

## 2015-12-10 DIAGNOSIS — Z79899 Other long term (current) drug therapy: Secondary | ICD-10-CM | POA: Diagnosis not present

## 2015-12-10 DIAGNOSIS — I10 Essential (primary) hypertension: Secondary | ICD-10-CM

## 2015-12-10 DIAGNOSIS — I4891 Unspecified atrial fibrillation: Secondary | ICD-10-CM | POA: Diagnosis not present

## 2015-12-10 LAB — CBC WITH DIFFERENTIAL/PLATELET
Basophils Absolute: 0 10*3/uL (ref 0.0–0.1)
Basophils Relative: 0 % (ref 0–1)
Eosinophils Absolute: 0.1 10*3/uL (ref 0.0–0.7)
Eosinophils Relative: 1 % (ref 0–5)
HCT: 46.4 % — ABNORMAL HIGH (ref 36.0–46.0)
Hemoglobin: 15.9 g/dL — ABNORMAL HIGH (ref 12.0–15.0)
Lymphocytes Relative: 30 % (ref 12–46)
Lymphs Abs: 2.3 10*3/uL (ref 0.7–4.0)
MCH: 30.9 pg (ref 26.0–34.0)
MCHC: 34.3 g/dL (ref 30.0–36.0)
MCV: 90.3 fL (ref 78.0–100.0)
MPV: 9.6 fL (ref 8.6–12.4)
Monocytes Absolute: 0.5 10*3/uL (ref 0.1–1.0)
Monocytes Relative: 6 % (ref 3–12)
Neutro Abs: 4.9 10*3/uL (ref 1.7–7.7)
Neutrophils Relative %: 63 % (ref 43–77)
Platelets: 245 10*3/uL (ref 150–400)
RBC: 5.14 MIL/uL — ABNORMAL HIGH (ref 3.87–5.11)
RDW: 13.3 % (ref 11.5–15.5)
WBC: 7.8 10*3/uL (ref 4.0–10.5)

## 2015-12-10 NOTE — Assessment & Plan Note (Signed)
Her now chronic atrial fib is reasonably well controlled. I have asked her to continue her current meds. We will obtain a digoxin level. We would consider uptitration of her beta blocker.

## 2015-12-10 NOTE — Patient Instructions (Signed)
Medication Instructions:  Your physician recommends that you continue on your current medications as directed. Please refer to the Current Medication list given to you today.   Labwork: Your physician recommends that you return for lab work in: TODAY (bmet, cbc with diff, lipid/liver, Dig. Level)   Testing/Procedures: none  Follow-Up: Your physician wants you to follow-up in: 12 months with Dr. Lovena Le. You will receive a reminder letter in the mail two months in advance. If you don't receive a letter, please call our office to schedule the follow-up appointment.   Any Other Special Instructions Will Be Listed Below (If Applicable).     If you need a refill on your cardiac medications before your next appointment, please call your pharmacy.

## 2015-12-10 NOTE — Progress Notes (Signed)
HPI [name] [name] returns today for followup. She is a very pleasant 82 year old woman with chronic atrial fibrillation, hypertension, dyslipidemia, and chronic anticoagulation with Coumadin. In the interim she has lost over 40 lbs since her husband died. She admits to not eating as much.  She denies cough, chest pain, or nausea or vomiting. She does not feel much in the way of palpitations. She has occaisional episodes of sob but she hasnot had syncope. Allergies  Allergen Reactions  . Antihistamines, Diphenhydramine-Type     ALL ANTIHISTAMINES causes her heart to race  . Oxycodone-Acetaminophen Nausea Only     Current Outpatient Prescriptions  Medication Sig Dispense Refill  . acetaminophen (TYLENOL) 500 MG tablet Take 1,000 mg by mouth every 6 (six) hours as needed. For pain    . ALPRAZolam (XANAX) 0.25 MG tablet Take 1 tablet (0.25 mg total) by mouth daily as needed for anxiety. 30 tablet 0  . atorvastatin (LIPITOR) 20 MG tablet Take 20 mg by mouth at bedtime.    . Calcium Carbonate-Vitamin D (CALCIUM-VITAMIN D) 500-200 MG-UNIT per tablet Take 1 tablet by mouth daily.      . digoxin (LANOXIN) 0.125 MG tablet TAKE 1 TABLET (0.125 MG TOTAL) BY MOUTH DAILY. 90 tablet 0  . hydrochlorothiazide (HYDRODIURIL) 25 MG tablet TAKE 1 TABLET (25 MG TOTAL) BY MOUTH DAILY. 90 tablet 3  . Hydrocortisone (GERHARDT'S BUTT CREAM) CREA Apply 1 application topically daily as needed for irritation.     . metoprolol (LOPRESSOR) 50 MG tablet TAKE 1/2 TABLET BY MOUTH 2 TIMES DAILY.Marland Kitchen PATIENT NEEDS OFFICE VISIT BEFORE ANY FURTHER REFILLS 90 tablet 0  . Multiple Vitamins-Minerals (MULTIVITAMIN WITH MINERALS) tablet Take 1 tablet by mouth daily.      Marland Kitchen warfarin (COUMADIN) 2.5 MG tablet TAKE AS DIRECTED BY THE COUMADIN CLINIC 30 tablet 1   No current facility-administered medications for this visit.     Past Medical History  Diagnosis Date  . Aftercare for healing traumatic fracture of arm, unspecified   .  Malignant melanoma of skin of trunk, except scrotum ([place])   . Rosacea   . Sprain of lumbar region   . Sciatica     right  . Other postprocedural status(V45.89)   . Hyperlipidemia   . H/O: hysterectomy   . S/P cholecystectomy   . H/O: hemorrhoidectomy   . Hypertension   . A-fib ([facility])     paroxysmal    ROS:   All systems reviewed and negative except as noted in the HPI.   Past Surgical History  Procedure Laterality Date  . Catheter ablation of atrail flutter    . Hernia repair    . Bladder suspension    . Abdominal hysterectomy    . Cholecystectomy    . Hemorrhoid surgery       Family History  Problem Relation Age of Onset  . Heart failure Mother   . Heart failure Father   . Liver cancer Daughter      Social History   Social History  . Marital Status: Widowed    Spouse Name: N/A  . Number of Children: 6  . Years of Education: 10   Occupational History  . telephone operator     retired   Social History Main Topics  . Smoking status: Never Smoker   . Smokeless tobacco: Never Used  . Alcohol Use: No  . Drug Use: No  . Sexual Activity: No   Other Topics Concern  . Not on file   Social History  Narrative   10th grade. Married '51.4 sons- '52, '54, '58, '70; 2 daughters- '56, '64 older girl died of ovarian cancer.11 grandchildren, 2 great grands. Retired Primary school teacher. ACP - wishes full code status: CPR, mechanical ventilator and all heroic measures.      BP 132/80 mmHg  Pulse 107  Ht 5\' 5"  (1.651 m)  Wt 161 lb 9.6 oz (73.301 kg)  BMI 26.89 kg/m2  Physical Exam:  stable appearing 82 year old woman, NAD HEENT: Unremarkable Neck:  7 cm JVD, no thyromegally Lungs:  Clear with no wheezes, rales, or rhonchi. HEART:  Regular rate rhythm, no murmurs, no rubs, no clicks Abd:  soft, obese,positive bowel sounds, no organomegally, no rebound, no guarding Ext:  2 plus pulses, no edema, no cyanosis, no clubbing Skin:  No rashes no nodules Neuro:  CN II  through XII intact, motor grossly intact  ECG - atrial fib with a rapid VR  Assess/Plan:

## 2015-12-10 NOTE — Assessment & Plan Note (Signed)
We will continue her statin. We will check her labs today.

## 2015-12-10 NOTE — Assessment & Plan Note (Signed)
She has lost 40 lbs in the last 1.5 years. She is eating less. Her weight is nearly normal now. I have asked her to maintain her weight in the 140-150 range.

## 2015-12-10 NOTE — Assessment & Plan Note (Signed)
Her blood pressure has been well controlled. Will continue her current meds.

## 2015-12-11 LAB — HEPATIC FUNCTION PANEL
ALT: 28 U/L (ref 6–29)
AST: 30 U/L (ref 10–35)
Albumin: 4.1 g/dL (ref 3.6–5.1)
Alkaline Phosphatase: 86 U/L (ref 33–130)
Bilirubin, Direct: 0.4 mg/dL — ABNORMAL HIGH (ref <=0.2)
Indirect Bilirubin: 2.2 mg/dL — ABNORMAL HIGH (ref 0.2–1.2)
Total Bilirubin: 2.6 mg/dL — ABNORMAL HIGH (ref 0.2–1.2)
Total Protein: 7.3 g/dL (ref 6.1–8.1)

## 2015-12-11 LAB — BASIC METABOLIC PANEL
BUN: 9 mg/dL (ref 7–25)
CO2: 23 mmol/L (ref 20–31)
Calcium: 9.5 mg/dL (ref 8.6–10.4)
Chloride: 103 mmol/L (ref 98–110)
Creat: 0.62 mg/dL (ref 0.60–0.88)
Glucose, Bld: 135 mg/dL — ABNORMAL HIGH (ref 65–99)
Potassium: 4 mmol/L (ref 3.5–5.3)
Sodium: 139 mmol/L (ref 135–146)

## 2015-12-11 LAB — DIGOXIN LEVEL: Digoxin Level: 0.9 ug/L (ref 0.8–2.0)

## 2015-12-11 LAB — LIPID PANEL
Cholesterol: 154 mg/dL (ref 125–200)
HDL: 53 mg/dL (ref >=46)
LDL Cholesterol: 80 mg/dL (ref <130)
Total CHOL/HDL Ratio: 2.9 Ratio (ref <=5.0)
Triglycerides: 106 mg/dL (ref <150)
VLDL: 21 mg/dL (ref <30)

## 2015-12-13 DIAGNOSIS — Z1231 Encounter for screening mammogram for malignant neoplasm of breast: Secondary | ICD-10-CM | POA: Diagnosis not present

## 2015-12-13 LAB — HM MAMMOGRAPHY

## 2015-12-15 ENCOUNTER — Ambulatory Visit (INDEPENDENT_AMBULATORY_CARE_PROVIDER_SITE_OTHER): Payer: Medicare Other | Admitting: General Practice

## 2015-12-15 DIAGNOSIS — Z7901 Long term (current) use of anticoagulants: Secondary | ICD-10-CM

## 2015-12-15 LAB — POCT INR: INR: 1.8

## 2015-12-15 NOTE — Progress Notes (Signed)
I have reviewed and agree with the plan. 

## 2015-12-20 ENCOUNTER — Encounter: Payer: Self-pay | Admitting: Internal Medicine

## 2015-12-25 ENCOUNTER — Other Ambulatory Visit: Payer: Self-pay | Admitting: Internal Medicine

## 2016-01-14 ENCOUNTER — Other Ambulatory Visit: Payer: Self-pay | Admitting: General Practice

## 2016-01-14 ENCOUNTER — Ambulatory Visit (INDEPENDENT_AMBULATORY_CARE_PROVIDER_SITE_OTHER): Payer: Medicare Other | Admitting: General Practice

## 2016-01-14 ENCOUNTER — Ambulatory Visit: Payer: Medicare Other

## 2016-01-14 DIAGNOSIS — Z7901 Long term (current) use of anticoagulants: Secondary | ICD-10-CM

## 2016-01-14 LAB — POCT INR: INR: 2.1

## 2016-01-14 MED ORDER — WARFARIN SODIUM 2.5 MG PO TABS: ORAL_TABLET | ORAL | Status: DC

## 2016-01-14 NOTE — Progress Notes (Signed)
I have reviewed and agree with the plan. 

## 2016-01-14 NOTE — Progress Notes (Signed)
Pre visit review using our clinic review tool, if applicable. No additional management support is needed unless otherwise documented below in the visit note. 

## 2016-01-29 ENCOUNTER — Other Ambulatory Visit: Payer: Self-pay | Admitting: Internal Medicine

## 2016-02-03 ENCOUNTER — Other Ambulatory Visit: Payer: Self-pay | Admitting: Internal Medicine

## 2016-02-10 ENCOUNTER — Encounter: Payer: Self-pay | Admitting: Internal Medicine

## 2016-02-10 ENCOUNTER — Ambulatory Visit (INDEPENDENT_AMBULATORY_CARE_PROVIDER_SITE_OTHER): Payer: Medicare Other | Admitting: Internal Medicine

## 2016-02-10 VITALS — BP 158/90 | HR 98 | Temp 98.3°F | Resp 16 | Ht 65.0 in | Wt 158.8 lb

## 2016-02-10 DIAGNOSIS — I10 Essential (primary) hypertension: Secondary | ICD-10-CM

## 2016-02-10 NOTE — Progress Notes (Signed)
Pre visit review using our clinic review tool, if applicable. No additional management support is needed unless otherwise documented below in the visit note. 

## 2016-02-10 NOTE — Patient Instructions (Signed)
Your liver is not fatty and it is okay for you to take tylenol. 4 pills per day.   The mole on your back is fine.   The hernia on your stomach does not need to be fixed.   Come back in about 6 months if you are doing well.

## 2016-02-11 ENCOUNTER — Ambulatory Visit (INDEPENDENT_AMBULATORY_CARE_PROVIDER_SITE_OTHER): Payer: Medicare Other | Admitting: General Practice

## 2016-02-11 DIAGNOSIS — Z7901 Long term (current) use of anticoagulants: Secondary | ICD-10-CM | POA: Diagnosis not present

## 2016-02-11 LAB — POCT INR: INR: 2.1

## 2016-02-11 NOTE — Progress Notes (Signed)
   Subjective:    Patient ID: [name] [name] [facility], female    DOB: May 11, 1931, 82 y.o.   MRN: [id number]  HPI The patient is an 82 YO female coming in for follow up of her blood pressure. She had some labs with her heart doctor and wants to discuss the results. She is still taking her medicines and denies any headache or chest pains. She does feel that her heart rate goes up some with exercise and this causes her to self limit her exercise. She does take her beta blocker as prescribed.   Review of Systems  Constitutional: Negative for fever, activity change, appetite change, fatigue and unexpected weight change.  Eyes: Negative.   Respiratory: Negative for cough, chest tightness, shortness of breath and wheezing.   Cardiovascular: Negative for chest pain, palpitations and leg swelling.  Gastrointestinal: Negative for nausea, abdominal pain, diarrhea, constipation, blood in stool, abdominal distention and anal bleeding.  Musculoskeletal: Positive for back pain and arthralgias. Negative for myalgias and gait problem.  Skin: Negative for color change, rash and wound.  Neurological: Negative for dizziness, syncope, weakness, light-headedness, numbness and headaches.  Psychiatric/Behavioral: Negative.       Objective:   Physical Exam  Constitutional: She is oriented to person, place, and time. She appears well-developed and well-nourished.  HENT:  Head: Normocephalic and atraumatic.  Eyes: EOM are normal.  Neck: Normal range of motion.  Cardiovascular: Normal rate.   Irreg irreg  Pulmonary/Chest: Effort normal and breath sounds normal. No respiratory distress. She has no wheezes. She has no rales.  Abdominal: Soft. She exhibits no distension. There is no tenderness. There is no rebound.  Musculoskeletal: She exhibits no edema.  Neurological: She is alert and oriented to person, place, and time. Coordination normal.  Skin: Skin is warm and dry.  Psychiatric: She has a normal mood and affect.    Filed Vitals:   02/10/16 1441  BP: 158/90  Pulse: 98  Temp: 98.3 F (36.8 C)  TempSrc: Oral  Resp: 16  Height: 5\' 5"  (1.651 m)  Weight: 158 lb 12.8 oz (72.031 kg)  SpO2: 97%      Assessment & Plan:

## 2016-02-11 NOTE — Progress Notes (Signed)
Pre visit review using our clinic review tool, if applicable. No additional management support is needed unless otherwise documented below in the visit note. 

## 2016-02-11 NOTE — Progress Notes (Signed)
I have reviewed and agree with the plan. 

## 2016-02-11 NOTE — Assessment & Plan Note (Signed)
Encouraged her to do some limited exercise such as walking with frequent rests to build back up her stamina until she is able to walk for 20 minutes at a time. She will work on this and wants to be more strong again. Taking hctz and metoprolol for her BP and has not taken the hctz yet today as it makes her urinate frequently. Previously BP at goal.

## 2016-02-27 ENCOUNTER — Other Ambulatory Visit: Payer: Self-pay | Admitting: Internal Medicine

## 2016-03-17 ENCOUNTER — Ambulatory Visit (INDEPENDENT_AMBULATORY_CARE_PROVIDER_SITE_OTHER): Payer: Medicare Other | Admitting: General Practice

## 2016-03-17 DIAGNOSIS — Z7901 Long term (current) use of anticoagulants: Secondary | ICD-10-CM | POA: Diagnosis not present

## 2016-03-17 LAB — POCT INR: INR: 2

## 2016-03-17 NOTE — Progress Notes (Signed)
Pre visit review using our clinic review tool, if applicable. No additional management support is needed unless otherwise documented below in the visit note. 

## 2016-03-17 NOTE — Progress Notes (Signed)
I have reviewed and agree with the plan. 

## 2016-04-28 ENCOUNTER — Ambulatory Visit (INDEPENDENT_AMBULATORY_CARE_PROVIDER_SITE_OTHER): Payer: Medicare Other | Admitting: General Practice

## 2016-04-28 DIAGNOSIS — Z7901 Long term (current) use of anticoagulants: Secondary | ICD-10-CM

## 2016-04-28 LAB — POCT INR: INR: 2.4

## 2016-04-28 NOTE — Progress Notes (Signed)
Pre visit review using our clinic review tool, if applicable. No additional management support is needed unless otherwise documented below in the visit note. 

## 2016-04-28 NOTE — Progress Notes (Signed)
I have reviewed and agree with the plan. 

## 2016-05-25 DIAGNOSIS — D225 Melanocytic nevi of trunk: Secondary | ICD-10-CM | POA: Diagnosis not present

## 2016-05-25 DIAGNOSIS — L821 Other seborrheic keratosis: Secondary | ICD-10-CM | POA: Diagnosis not present

## 2016-05-25 DIAGNOSIS — D1801 Hemangioma of skin and subcutaneous tissue: Secondary | ICD-10-CM | POA: Diagnosis not present

## 2016-05-25 DIAGNOSIS — Z85828 Personal history of other malignant neoplasm of skin: Secondary | ICD-10-CM | POA: Diagnosis not present

## 2016-06-09 ENCOUNTER — Ambulatory Visit (INDEPENDENT_AMBULATORY_CARE_PROVIDER_SITE_OTHER): Payer: Medicare Other | Admitting: General Practice

## 2016-06-09 DIAGNOSIS — Z7901 Long term (current) use of anticoagulants: Secondary | ICD-10-CM

## 2016-06-09 LAB — POCT INR: INR: 2

## 2016-06-09 NOTE — Progress Notes (Signed)
Pre visit review using our clinic review tool, if applicable. No additional management support is needed unless otherwise documented below in the visit note. 

## 2016-06-09 NOTE — Progress Notes (Signed)
I have reviewed and agree with the plan. 

## 2016-06-16 ENCOUNTER — Other Ambulatory Visit: Payer: Self-pay | Admitting: *Deleted

## 2016-06-16 MED ORDER — METOPROLOL TARTRATE 50 MG PO TABS
ORAL_TABLET | ORAL | Status: DC
Start: 2016-06-16 — End: 2016-09-10

## 2016-07-08 ENCOUNTER — Other Ambulatory Visit: Payer: Self-pay | Admitting: Internal Medicine

## 2016-07-10 ENCOUNTER — Other Ambulatory Visit: Payer: Self-pay | Admitting: General Practice

## 2016-07-10 MED ORDER — WARFARIN SODIUM 2.5 MG PO TABS
ORAL_TABLET | ORAL | 1 refills | Status: DC
Start: 2016-07-10 — End: 2017-01-05

## 2016-07-21 ENCOUNTER — Ambulatory Visit (INDEPENDENT_AMBULATORY_CARE_PROVIDER_SITE_OTHER): Payer: Medicare Other | Admitting: General Practice

## 2016-07-21 DIAGNOSIS — Z7901 Long term (current) use of anticoagulants: Secondary | ICD-10-CM

## 2016-07-21 LAB — POCT INR: INR: 2.9

## 2016-07-21 NOTE — Progress Notes (Signed)
I have reviewed and agree with the plan. 

## 2016-08-25 ENCOUNTER — Ambulatory Visit (INDEPENDENT_AMBULATORY_CARE_PROVIDER_SITE_OTHER): Payer: Medicare Other | Admitting: General Practice

## 2016-08-25 DIAGNOSIS — Z23 Encounter for immunization: Secondary | ICD-10-CM | POA: Diagnosis not present

## 2016-08-25 DIAGNOSIS — Z7901 Long term (current) use of anticoagulants: Secondary | ICD-10-CM | POA: Diagnosis not present

## 2016-08-25 LAB — POCT INR: INR: 1.8

## 2016-08-25 NOTE — Progress Notes (Signed)
I have reviewed and agree with the plan. 

## 2016-08-29 ENCOUNTER — Other Ambulatory Visit: Payer: Self-pay | Admitting: Internal Medicine

## 2016-09-10 ENCOUNTER — Other Ambulatory Visit: Payer: Self-pay | Admitting: Internal Medicine

## 2016-09-22 ENCOUNTER — Ambulatory Visit: Payer: Medicare Other

## 2016-09-27 ENCOUNTER — Ambulatory Visit (INDEPENDENT_AMBULATORY_CARE_PROVIDER_SITE_OTHER): Payer: Medicare Other | Admitting: General Practice

## 2016-09-27 DIAGNOSIS — Z7901 Long term (current) use of anticoagulants: Secondary | ICD-10-CM | POA: Diagnosis not present

## 2016-09-27 LAB — POCT INR: INR: 2.8

## 2016-09-27 NOTE — Patient Instructions (Signed)
Pre visit review using our clinic review tool, if applicable. No additional management support is needed unless otherwise documented below in the visit note. 

## 2016-09-27 NOTE — Progress Notes (Signed)
I have reviewed and agree with the plan. 

## 2016-10-22 ENCOUNTER — Other Ambulatory Visit: Payer: Self-pay | Admitting: Internal Medicine

## 2016-10-23 ENCOUNTER — Other Ambulatory Visit: Payer: Self-pay | Admitting: Internal Medicine

## 2016-10-25 ENCOUNTER — Other Ambulatory Visit: Payer: Self-pay | Admitting: Internal Medicine

## 2016-10-27 ENCOUNTER — Ambulatory Visit (INDEPENDENT_AMBULATORY_CARE_PROVIDER_SITE_OTHER): Payer: Medicare Other | Admitting: General Practice

## 2016-10-27 DIAGNOSIS — Z7901 Long term (current) use of anticoagulants: Secondary | ICD-10-CM

## 2016-10-27 LAB — POCT INR: INR: 1.8

## 2016-10-27 NOTE — Progress Notes (Signed)
I have reviewed and agree with the plan. 

## 2016-10-27 NOTE — Patient Instructions (Signed)
Pre visit review using our clinic review tool, if applicable. No additional management support is needed unless otherwise documented below in the visit note. 

## 2016-11-10 ENCOUNTER — Encounter: Payer: Self-pay | Admitting: Family

## 2016-11-10 ENCOUNTER — Ambulatory Visit (INDEPENDENT_AMBULATORY_CARE_PROVIDER_SITE_OTHER): Payer: Medicare Other | Admitting: Family

## 2016-11-10 DIAGNOSIS — J0140 Acute pansinusitis, unspecified: Secondary | ICD-10-CM

## 2016-11-10 DIAGNOSIS — J329 Chronic sinusitis, unspecified: Secondary | ICD-10-CM | POA: Insufficient documentation

## 2016-11-10 MED ORDER — AMOXICILLIN-POT CLAVULANATE 875-125 MG PO TABS
1.0000 | ORAL_TABLET | Freq: Two times a day (BID) | ORAL | 0 refills | Status: DC
Start: 2016-11-10 — End: 2017-05-02

## 2016-11-10 NOTE — Patient Instructions (Signed)
Thank you for choosing [facility].  SUMMARY AND INSTRUCTIONS:  Medication:  Your prescription(s) have been submitted to your pharmacy or been printed and provided for you. Please take as directed and contact our office if you believe you are having problem(s) with the medication(s) or have any questions.  Follow up:  If your symptoms worsen or fail to improve, please contact our office for further instruction, or in case of emergency go directly to the emergency room at the closest medical facility.    General Recommendations:    Please drink plenty of fluids.  Get plenty of rest   Sleep in humidified air  Use saline nasal sprays  Netti pot   OTC Medications:  Decongestants - helps relieve congestion   Flonase (generic fluticasone) or Nasacort (generic triamcinolone) - please make sure to use the "cross-over" technique at a 45 degree angle towards the opposite eye as opposed to straight up the nasal passageway.   Sudafed (generic pseudoephedrine - Note this is the one that is available behind the pharmacy counter); Products with phenylephrine (-PE) may also be used but is often not as effective as pseudoephedrine.   If you have HIGH BLOOD PRESSURE - Coricidin HBP; AVOID any product that is -D as this contains pseudoephedrine which may increase your blood pressure.  Afrin (oxymetazoline) every 6-8 hours for up to 3 days.   Allergies - helps relieve runny nose, itchy eyes and sneezing   Claritin (generic loratidine), Allegra (fexofenidine), or Zyrtec (generic cyrterizine) for runny nose. These medications should not cause drowsiness.  Note - Benadryl (generic diphenhydramine) may be used however may cause drowsiness  Cough -   Delsym or Robitussin (generic dextromethorphan)  Expectorants - helps loosen mucus to ease removal   Mucinex (generic guaifenesin) as directed on the package.  Headaches / General Aches   Tylenol (generic acetaminophen) - DO NOT  EXCEED 3 grams (3,000 mg) in a 24 hour time period  Advil/Motrin (generic ibuprofen)   Sore Throat -   Salt water gargle   Chloraseptic (generic benzocaine) spray or lozenges / Sucrets (generic dyclonine)     

## 2016-11-10 NOTE — Progress Notes (Signed)
Subjective:    Patient ID: [name] [name], female    DOB: January 08, 1931, 82 y.o.   MRN: [id number]  Chief Complaint  Patient presents with  . Cough    congestion, cough, and feels bad, started the beginning of the week    HPI:  [name] [name] is a 82 y.o. female who  has a past medical history of A-fib ([place]); Aftercare for healing traumatic fracture of arm, unspecified; H/O: hemorrhoidectomy; H/O: hysterectomy; Hyperlipidemia; Hypertension; Malignant melanoma of skin of trunk, except scrotum ([place]); Other postprocedural status(V45.89); Rosacea; S/P cholecystectomy; Sciatica; and Sprain of lumbar region. and presents today for an acute office visit.  This is a new problem. Associated symptoms of cough, congestion and malaise have been going on for about 4 days. Describes a productive cough with green color sputum. May have a mild fever at home. No modifying factors that make it better or worse with no attempted treatments. Over the course she has stayed about the same with no improvement. No sick contracts. No recent antibiotics.   Allergies  Allergen Reactions  . Antihistamines, Diphenhydramine-Type     ALL ANTIHISTAMINES causes her heart to race  . Oxycodone-Acetaminophen Nausea Only      Outpatient Medications Prior to Visit  Medication Sig Dispense Refill  . acetaminophen (TYLENOL) 500 MG tablet Take 1,000 mg by mouth every 6 (six) hours as needed. For pain    . ALPRAZolam (XANAX) 0.25 MG tablet Take 1 tablet (0.25 mg total) by mouth daily as needed for anxiety. 30 tablet 0  . atorvastatin (LIPITOR) 20 MG tablet TAKE 1 TABLET AT BEDTIME 90 tablet 0  . Calcium Carbonate-Vitamin D (CALCIUM-VITAMIN D) 500-200 MG-UNIT per tablet Take 1 tablet by mouth daily.      . digoxin (LANOXIN) 0.125 MG tablet TAKE 1 TABLET (0.125 MG TOTAL) BY MOUTH DAILY. 30 tablet 1  . hydrochlorothiazide (HYDRODIURIL) 25 MG tablet TAKE 1 TABLET BY MOUTH EVERY DAY 90 tablet 3  . Hydrocortisone (GERHARDT'S BUTT  CREAM) CREA Apply 1 application topically daily as needed for irritation.     . metoprolol (LOPRESSOR) 50 MG tablet TAKE 1/2 TABLET BY MOUTH TWICE DAILY (NEED PHYSICAL IN SEPT) 90 tablet 0  . Multiple Vitamins-Minerals (MULTIVITAMIN WITH MINERALS) tablet Take 1 tablet by mouth daily.      Marland Kitchen warfarin (COUMADIN) 2.5 MG tablet Take as directed by anticoagulation clinic 120 tablet 1  . atorvastatin (LIPITOR) 20 MG tablet Take 20 mg by mouth at bedtime.     No facility-administered medications prior to visit.       Past Surgical History:  Procedure Laterality Date  . ABDOMINAL HYSTERECTOMY    . BLADDER SUSPENSION    . catheter ablation of atrail flutter    . CHOLECYSTECTOMY    . HEMORRHOID SURGERY    . HERNIA REPAIR        Past Medical History:  Diagnosis Date  . A-fib ([place])    paroxysmal  . Aftercare for healing traumatic fracture of arm, unspecified   . H/O: hemorrhoidectomy   . H/O: hysterectomy   . Hyperlipidemia   . Hypertension   . Malignant melanoma of skin of trunk, except scrotum ([place])   . Other postprocedural status(V45.89)   . Rosacea   . S/P cholecystectomy   . Sciatica    right  . Sprain of lumbar region       Review of Systems  Constitutional: Negative for chills and fever.  HENT: Positive for congestion and sore throat. Negative  for ear pain.   Respiratory: Positive for cough. Negative for chest tightness, shortness of breath and wheezing.   Neurological: Positive for headaches.      Objective:    BP (!) 152/80 (BP Location: Left Arm, Patient Position: Sitting, Cuff Size: Normal)   Pulse 85   Temp 99.3 F (37.4 C) (Oral)   Resp 16   Ht 5\' 5"  (1.651 m)   Wt 148 lb (67.1 kg)   SpO2 98%   BMI 24.63 kg/m  Nursing note and vital signs reviewed.  Physical Exam  Constitutional: She is oriented to person, place, and time. She appears well-developed and well-nourished. No distress.  HENT:  Right Ear: Hearing, tympanic membrane, external ear and ear  canal normal.  Left Ear: Hearing, tympanic membrane, external ear and ear canal normal.  Nose: Right sinus exhibits maxillary sinus tenderness and frontal sinus tenderness. Left sinus exhibits maxillary sinus tenderness and frontal sinus tenderness.  Mouth/Throat: Uvula is midline, oropharynx is clear and moist and mucous membranes are normal.  Neck: Neck supple.  Cardiovascular: Normal rate, regular rhythm, normal heart sounds and intact distal pulses.   Pulmonary/Chest: Effort normal and breath sounds normal.  Neurological: She is alert and oriented to person, place, and time.  Skin: Skin is warm and dry.  Psychiatric: She has a normal mood and affect. Her behavior is normal. Judgment and thought content normal.       Assessment & Plan:   Problem List Items Addressed This Visit      Respiratory   Sinusitis    Symptoms and exam concern for bacterial sinusitis given continued increased temperature. Lung sounds are clear and vital signs stable. Start Augmentin. Continue OTC medications as needed for symptom relief and supportive care. Has potential to worsen given age. Follow up if symptoms worsen or do not improve.       Relevant Medications   amoxicillin-clavulanate (AUGMENTIN) 875-125 MG tablet       I am having [name] start on amoxicillin-clavulanate. I am also having her maintain her multivitamin with minerals, calcium-vitamin D, acetaminophen, Gerhardt's butt cream, ALPRAZolam, warfarin, atorvastatin, metoprolol, digoxin, and hydrochlorothiazide.   Meds ordered this encounter  Medications  . amoxicillin-clavulanate (AUGMENTIN) 875-125 MG tablet    Sig: Take 1 tablet by mouth 2 (two) times daily.    Dispense:  20 tablet    Refill:  0    Order Specific Question:   Supervising Provider    Answer:   Pricilla Holm A [id number]     Follow-up: Return if symptoms worsen or fail to improve.  Mauricio Po, FNP

## 2016-11-10 NOTE — Assessment & Plan Note (Signed)
Symptoms and exam concern for bacterial sinusitis given continued increased temperature. Lung sounds are clear and vital signs stable. Start Augmentin. Continue OTC medications as needed for symptom relief and supportive care. Has potential to worsen given age. Follow up if symptoms worsen or do not improve.

## 2016-11-25 ENCOUNTER — Other Ambulatory Visit: Payer: Self-pay | Admitting: Internal Medicine

## 2016-12-01 ENCOUNTER — Ambulatory Visit (INDEPENDENT_AMBULATORY_CARE_PROVIDER_SITE_OTHER): Payer: Medicare Other | Admitting: General Practice

## 2016-12-01 DIAGNOSIS — Z7901 Long term (current) use of anticoagulants: Secondary | ICD-10-CM

## 2016-12-01 LAB — POCT INR: INR: 2

## 2016-12-01 NOTE — Patient Instructions (Signed)
Pre visit review using our clinic review tool, if applicable. No additional management support is needed unless otherwise documented below in the visit note. 

## 2016-12-01 NOTE — Progress Notes (Signed)
I have reviewed and agree with the plan. 

## 2016-12-08 ENCOUNTER — Other Ambulatory Visit: Payer: Self-pay | Admitting: Internal Medicine

## 2016-12-14 ENCOUNTER — Ambulatory Visit: Payer: Medicare Other | Admitting: Internal Medicine

## 2016-12-19 ENCOUNTER — Other Ambulatory Visit: Payer: Self-pay | Admitting: Internal Medicine

## 2016-12-20 ENCOUNTER — Ambulatory Visit (INDEPENDENT_AMBULATORY_CARE_PROVIDER_SITE_OTHER): Payer: Medicare Other | Admitting: Internal Medicine

## 2016-12-20 ENCOUNTER — Encounter: Payer: Self-pay | Admitting: Internal Medicine

## 2016-12-20 VITALS — BP 132/88 | HR 108 | Ht 64.5 in | Wt 147.4 lb

## 2016-12-20 DIAGNOSIS — I482 Chronic atrial fibrillation, unspecified: Secondary | ICD-10-CM

## 2016-12-20 DIAGNOSIS — I10 Essential (primary) hypertension: Secondary | ICD-10-CM | POA: Diagnosis not present

## 2016-12-20 DIAGNOSIS — Z1231 Encounter for screening mammogram for malignant neoplasm of breast: Secondary | ICD-10-CM | POA: Diagnosis not present

## 2016-12-20 DIAGNOSIS — E785 Hyperlipidemia, unspecified: Secondary | ICD-10-CM | POA: Diagnosis not present

## 2016-12-20 DIAGNOSIS — Z79899 Other long term (current) drug therapy: Secondary | ICD-10-CM | POA: Diagnosis not present

## 2016-12-20 LAB — HM MAMMOGRAPHY

## 2016-12-20 NOTE — Patient Instructions (Addendum)
Medication Instructions:  Your physician recommends that you continue on your current medications as directed. Please refer to the Current Medication list given to you today.   Labwork: TODAY: Fasting Lipid / Liver Panel / Digoxin Level   Testing/Procedures: None Ordered   Follow-Up: Your physician wants you to follow-up in: 1 year with Dr. Lovena Le. You will receive a reminder letter in the mail two months in advance. If you don't receive a letter, please call our office to schedule the follow-up appointment.   Any Other Special Instructions Will Be Listed Below (If Applicable).     If you need a refill on your cardiac medications before your next appointment, please call your pharmacy.

## 2016-12-20 NOTE — Progress Notes (Signed)
HPI [name] [name] returns today for followup. She is a very pleasant 83 year old woman with chronic atrial fibrillation, hypertension, dyslipidemia, and chronic anticoagulation with Coumadin. In the interim she has lost over 40 lbs since her husband died. Her weight has remained off. She admits to not eating as much.  She denies cough, chest pain, or nausea or vomiting. She does not feel much in the way of palpitations. She has occaisional episodes of sob but she has not had syncope. She did not take any of her meds today and she is fasting in anticipation of having lab work done. Allergies  Allergen Reactions  . Antihistamines, Diphenhydramine-Type     ALL ANTIHISTAMINES causes her heart to race  . Oxycodone-Acetaminophen Nausea Only     Current Outpatient Prescriptions  Medication Sig Dispense Refill  . acetaminophen (TYLENOL) 500 MG tablet Take 1,000 mg by mouth every 6 (six) hours as needed. For pain    . ALPRAZolam (XANAX) 0.25 MG tablet Take 1 tablet (0.25 mg total) by mouth daily as needed for anxiety. 30 tablet 0  . amoxicillin-clavulanate (AUGMENTIN) 875-125 MG tablet Take 1 tablet by mouth 2 (two) times daily. 20 tablet 0  . atorvastatin (LIPITOR) 20 MG tablet TAKE 1 TABLET AT BEDTIME 90 tablet 0  . Calcium Carbonate-Vitamin D (CALCIUM-VITAMIN D) 500-200 MG-UNIT per tablet Take 1 tablet by mouth daily.      . digoxin (LANOXIN) 0.125 MG tablet TAKE 1 TABLET (0.125 MG TOTAL) BY MOUTH DAILY. 30 tablet 1  . hydrochlorothiazide (HYDRODIURIL) 25 MG tablet TAKE 1 TABLET BY MOUTH EVERY DAY 90 tablet 3  . Hydrocortisone (GERHARDT'S BUTT CREAM) CREA Apply 1 application topically daily as needed for irritation.     . metoprolol (LOPRESSOR) 50 MG tablet TAKE 1/2 TABLET BY MOUTH TWICE DAILY (NEED PHYSICAL IN SEPT) 90 tablet 1  . Multiple Vitamins-Minerals (MULTIVITAMIN WITH MINERALS) tablet Take 1 tablet by mouth daily.      Marland Kitchen warfarin (COUMADIN) 2.5 MG tablet Take as directed by anticoagulation  clinic 120 tablet 1   No current facility-administered medications for this visit.      Past Medical History:  Diagnosis Date  . A-fib ([place])    paroxysmal  . Aftercare for healing traumatic fracture of arm, unspecified   . H/O: hemorrhoidectomy   . H/O: hysterectomy   . Hyperlipidemia   . Hypertension   . Malignant melanoma of skin of trunk, except scrotum ([place])   . Other postprocedural status(V45.89)   . Rosacea   . S/P cholecystectomy   . Sciatica    right  . Sprain of lumbar region     ROS:   All systems reviewed and negative except as noted in the HPI.   Past Surgical History:  Procedure Laterality Date  . ABDOMINAL HYSTERECTOMY    . BLADDER SUSPENSION    . catheter ablation of atrail flutter    . CHOLECYSTECTOMY    . HEMORRHOID SURGERY    . HERNIA REPAIR       Family History  Problem Relation Age of Onset  . Heart failure Mother   . Heart failure Father   . Liver cancer Daughter      Social History   Social History  . Marital status: Widowed    Spouse name: N/A  . Number of children: 6  . Years of education: 10   Occupational History  . telephone operator Retired    retired   Social History Main Topics  . Smoking status: Never Smoker  .  Smokeless tobacco: Never Used  . Alcohol use No  . Drug use: No  . Sexual activity: No   Other Topics Concern  . Not on file   Social History Narrative   10th grade. Married '51.4 sons- '52, '54, '58, '70; 2 daughters- '56, '64 older girl died of ovarian cancer.11 grandchildren, 2 great grands. Retired Primary school teacher. ACP - wishes full code status: CPR, mechanical ventilator and all heroic measures.      BP 132/88   Pulse (!) 108   Ht 5' 4.5" (1.638 m)   Wt 147 lb 6.4 oz (66.9 kg)   BMI 24.91 kg/m   Physical Exam:  stable appearing 83 year old woman, NAD HEENT: Unremarkable Neck:  7 cm JVD, no thyromegally Lungs:  Clear with no wheezes, rales, or rhonchi. HEART:  Regular rate rhythm, no  murmurs, no rubs, no clicks Abd:  soft, obese,positive bowel sounds, no organomegally, no rebound, no guarding Ext:  2 plus pulses, no edema, no cyanosis, no clubbing Skin:  No rashes no nodules Neuro:  CN II through XII intact, motor grossly intact  ECG - atrial fib with a rapid VR  Assess/Plan:  1. Atrial fib - at home her rates are controlled. Today she did not take her meds and her VR is up. I have encouraged her not to miss her meds. 2. HTN - her blood pressure is up a bit. She did not take her meds. 3. Obesity - this has resolved. 4. Dyslipidemia - she will continue her statin. She will have fasting lipids done today.   Mikle Bosworth.D.

## 2016-12-21 LAB — HEPATIC FUNCTION PANEL
ALT: 36 IU/L — ABNORMAL HIGH (ref 0–32)
AST: 28 IU/L (ref 0–40)
Albumin: 4.4 g/dL (ref 3.5–4.7)
Alkaline Phosphatase: 102 IU/L (ref 39–117)
Bilirubin Total: 2.1 mg/dL — ABNORMAL HIGH (ref 0.0–1.2)
Bilirubin, Direct: 0.4 mg/dL (ref 0.00–0.40)
Total Protein: 7.2 g/dL (ref 6.0–8.5)

## 2016-12-21 LAB — LIPID PANEL
Chol/HDL Ratio: 2.8 ratio units (ref 0.0–4.4)
Cholesterol, Total: 167 mg/dL (ref 100–199)
HDL: 59 mg/dL (ref >39)
LDL Calculated: 86 mg/dL (ref 0–99)
Triglycerides: 108 mg/dL (ref 0–149)
VLDL Cholesterol Cal: 22 mg/dL (ref 5–40)

## 2016-12-21 LAB — DIGOXIN LEVEL: Digoxin, Serum: 0.6 ng/mL (ref 0.5–0.9)

## 2016-12-26 ENCOUNTER — Encounter: Payer: Self-pay | Admitting: Internal Medicine

## 2016-12-26 NOTE — Progress Notes (Unsigned)
Results entered and sent to scan  

## 2016-12-29 ENCOUNTER — Ambulatory Visit (INDEPENDENT_AMBULATORY_CARE_PROVIDER_SITE_OTHER): Payer: Medicare Other | Admitting: General Practice

## 2016-12-29 DIAGNOSIS — Z7901 Long term (current) use of anticoagulants: Secondary | ICD-10-CM

## 2016-12-29 LAB — POCT INR: INR: 2

## 2016-12-29 NOTE — Progress Notes (Signed)
I have reviewed and agree with the plan. 

## 2016-12-29 NOTE — Patient Instructions (Signed)
Pre visit review using our clinic review tool, if applicable. No additional management support is needed unless otherwise documented below in the visit note. 

## 2017-01-05 ENCOUNTER — Other Ambulatory Visit: Payer: Self-pay | Admitting: Internal Medicine

## 2017-01-25 DIAGNOSIS — H43813 Vitreous degeneration, bilateral: Secondary | ICD-10-CM | POA: Diagnosis not present

## 2017-01-25 DIAGNOSIS — H25093 Other age-related incipient cataract, bilateral: Secondary | ICD-10-CM | POA: Diagnosis not present

## 2017-01-25 DIAGNOSIS — H5213 Myopia, bilateral: Secondary | ICD-10-CM | POA: Diagnosis not present

## 2017-01-25 DIAGNOSIS — H1840 Unspecified corneal degeneration: Secondary | ICD-10-CM | POA: Diagnosis not present

## 2017-01-26 ENCOUNTER — Ambulatory Visit (INDEPENDENT_AMBULATORY_CARE_PROVIDER_SITE_OTHER): Payer: Medicare Other | Admitting: General Practice

## 2017-01-26 DIAGNOSIS — Z5181 Encounter for therapeutic drug level monitoring: Secondary | ICD-10-CM

## 2017-01-26 LAB — POCT INR: INR: 2.2

## 2017-01-26 NOTE — Patient Instructions (Signed)
Pre visit review using our clinic review tool, if applicable. No additional management support is needed unless otherwise documented below in the visit note. 

## 2017-01-26 NOTE — Progress Notes (Signed)
I have reviewed and agree with the plan. 

## 2017-02-24 ENCOUNTER — Other Ambulatory Visit: Payer: Self-pay | Admitting: Internal Medicine

## 2017-02-26 ENCOUNTER — Telehealth: Payer: Self-pay | Admitting: Internal Medicine

## 2017-02-26 NOTE — Telephone Encounter (Signed)
Called patient to schedule awv with health coach. Patient did not answer. Will try to call patient at a later time.

## 2017-03-02 ENCOUNTER — Ambulatory Visit (INDEPENDENT_AMBULATORY_CARE_PROVIDER_SITE_OTHER): Payer: Medicare Other | Admitting: General Practice

## 2017-03-02 DIAGNOSIS — Z5181 Encounter for therapeutic drug level monitoring: Secondary | ICD-10-CM

## 2017-03-02 LAB — POCT INR: INR: 2.5

## 2017-03-02 NOTE — Progress Notes (Signed)
I have reviewed and agree with the plan. 

## 2017-03-02 NOTE — Patient Instructions (Signed)
Pre visit review using our clinic review tool, if applicable. No additional management support is needed unless otherwise documented below in the visit note. 

## 2017-03-30 ENCOUNTER — Ambulatory Visit (INDEPENDENT_AMBULATORY_CARE_PROVIDER_SITE_OTHER): Payer: Medicare Other | Admitting: General Practice

## 2017-03-30 ENCOUNTER — Ambulatory Visit: Payer: Medicare Other

## 2017-03-30 DIAGNOSIS — Z5181 Encounter for therapeutic drug level monitoring: Secondary | ICD-10-CM

## 2017-03-30 LAB — POCT INR: INR: 2.5

## 2017-03-30 NOTE — Progress Notes (Signed)
I have reviewed and agree with the plan. 

## 2017-03-30 NOTE — Patient Instructions (Signed)
Pre visit review using our clinic review tool, if applicable. No additional management support is needed unless otherwise documented below in the visit note. 

## 2017-04-06 ENCOUNTER — Other Ambulatory Visit: Payer: Self-pay | Admitting: Internal Medicine

## 2017-04-10 ENCOUNTER — Other Ambulatory Visit: Payer: Self-pay | Admitting: Internal Medicine

## 2017-05-02 ENCOUNTER — Other Ambulatory Visit (INDEPENDENT_AMBULATORY_CARE_PROVIDER_SITE_OTHER): Payer: Medicare Other

## 2017-05-02 ENCOUNTER — Encounter: Payer: Self-pay | Admitting: Internal Medicine

## 2017-05-02 ENCOUNTER — Ambulatory Visit (INDEPENDENT_AMBULATORY_CARE_PROVIDER_SITE_OTHER): Payer: Medicare Other | Admitting: Internal Medicine

## 2017-05-02 VITALS — BP 150/80 | HR 101 | Temp 97.8°F | Resp 12 | Ht 64.5 in | Wt 147.0 lb

## 2017-05-02 DIAGNOSIS — F419 Anxiety disorder, unspecified: Secondary | ICD-10-CM | POA: Diagnosis not present

## 2017-05-02 DIAGNOSIS — E785 Hyperlipidemia, unspecified: Secondary | ICD-10-CM | POA: Diagnosis not present

## 2017-05-02 DIAGNOSIS — I10 Essential (primary) hypertension: Secondary | ICD-10-CM

## 2017-05-02 LAB — BASIC METABOLIC PANEL
BUN: 16 mg/dL (ref 6–23)
CO2: 28 mEq/L (ref 19–32)
Calcium: 10.8 mg/dL — ABNORMAL HIGH (ref 8.4–10.5)
Chloride: 104 mEq/L (ref 96–112)
Creatinine, Ser: 0.63 mg/dL (ref 0.40–1.20)
GFR: 95.78 mL/min (ref >60.00)
Glucose, Bld: 145 mg/dL — ABNORMAL HIGH (ref 70–99)
Potassium: 3.6 mEq/L (ref 3.5–5.1)
Sodium: 139 mEq/L (ref 135–145)

## 2017-05-02 LAB — CBC
HCT: 44.8 % (ref 36.0–46.0)
Hemoglobin: 15.5 g/dL — ABNORMAL HIGH (ref 12.0–15.0)
MCHC: 34.6 g/dL (ref 30.0–36.0)
MCV: 88.5 fl (ref 78.0–100.0)
Platelets: 263 10*3/uL (ref 150.0–400.0)
RBC: 5.06 Mil/uL (ref 3.87–5.11)
RDW: 13.6 % (ref 11.5–15.5)
WBC: 7.3 10*3/uL (ref 4.0–10.5)

## 2017-05-02 MED ORDER — ALPRAZOLAM 0.25 MG PO TABS: 0.2500 mg | ORAL_TABLET | Freq: Every day | ORAL | 0 refills | Status: DC | PRN

## 2017-05-02 NOTE — Assessment & Plan Note (Signed)
Uses rare xanax and last filled #30 about 1.5 years ago. Refilled at today's visit. She does know about calming techniques and uses these as first line.

## 2017-05-02 NOTE — Assessment & Plan Note (Addendum)
BP mildly elevated at onset of visit and lower at recheck, taking metoprolol and hctz. Checking BMP and adjust as needed.

## 2017-05-02 NOTE — Progress Notes (Signed)
   Subjective:    Patient ID: Andersen [facility], female    DOB: [birth year], 83 y.o.   MRN: [id number]  HPI The patient is an 83 YO female coming in for follow up of blood pressure (taking hctz and metoprolol, no side effects, has taken today, no chest pains or SOB or abdominal pain), and her cholesterol (taking lipitor 20 mg daily, no side effects), and her anxiety (she rarely uses xanax, has an old bottle and wants refill, some life stressors with her child recently, denies depression, denies SI/HI, generally uses calming techniques before trying the xanax).   Review of Systems  Constitutional: Negative.   HENT: Negative.   Eyes: Negative.   Respiratory: Negative for cough, chest tightness and shortness of breath.   Cardiovascular: Negative for chest pain, palpitations and leg swelling.  Gastrointestinal: Negative for abdominal distention, abdominal pain, constipation, diarrhea, nausea and vomiting.  Musculoskeletal: Negative.   Skin: Negative.   Neurological: Negative.   Psychiatric/Behavioral: Negative.       Objective:   Physical Exam  Constitutional: She is oriented to person, place, and time. She appears well-developed and well-nourished.  HENT:  Head: Normocephalic and atraumatic.  Eyes: EOM are normal.  Neck: Normal range of motion.  Cardiovascular: Normal rate.   Pulmonary/Chest: Effort normal and breath sounds normal. No respiratory distress. She has no wheezes. She has no rales.  Abdominal: Soft. Bowel sounds are normal. She exhibits no distension. There is no tenderness. There is no rebound.  Musculoskeletal: She exhibits no edema.  Neurological: She is alert and oriented to person, place, and time. Coordination normal.  Skin: Skin is warm and dry.  Psychiatric: She has a normal mood and affect.   Vitals:   05/02/17 1331  BP: (!) 150/80  Pulse: (!) 101  Resp: 12  Temp: 97.8 F (36.6 C)  TempSrc: Oral  SpO2: 96%  Weight: 147 lb (66.7 kg)  Height: 5' 4.5" (1.638 m)        Assessment & Plan:

## 2017-05-02 NOTE — Patient Instructions (Signed)
We will check the labs today and refill the xanax (alprazolam).

## 2017-05-02 NOTE — Assessment & Plan Note (Signed)
Recent lipid panel at goal on her lipitor 20 mg daily, no side effects.

## 2017-05-04 ENCOUNTER — Ambulatory Visit (INDEPENDENT_AMBULATORY_CARE_PROVIDER_SITE_OTHER): Payer: Medicare Other | Admitting: General Practice

## 2017-05-04 DIAGNOSIS — Z7901 Long term (current) use of anticoagulants: Secondary | ICD-10-CM

## 2017-05-04 DIAGNOSIS — I4891 Unspecified atrial fibrillation: Secondary | ICD-10-CM

## 2017-05-04 LAB — POCT INR: INR: 2.4

## 2017-05-04 NOTE — Patient Instructions (Signed)
Pre visit review using our clinic review tool, if applicable. No additional management support is needed unless otherwise documented below in the visit note. 

## 2017-05-04 NOTE — Progress Notes (Signed)
I have reviewed and agree with the plan. 

## 2017-05-08 DIAGNOSIS — L905 Scar conditions and fibrosis of skin: Secondary | ICD-10-CM | POA: Diagnosis not present

## 2017-05-08 DIAGNOSIS — L821 Other seborrheic keratosis: Secondary | ICD-10-CM | POA: Diagnosis not present

## 2017-05-08 DIAGNOSIS — D239 Other benign neoplasm of skin, unspecified: Secondary | ICD-10-CM | POA: Diagnosis not present

## 2017-05-15 DIAGNOSIS — L905 Scar conditions and fibrosis of skin: Secondary | ICD-10-CM | POA: Diagnosis not present

## 2017-05-18 NOTE — Progress Notes (Signed)
Pre visit review using our clinic review tool, if applicable. No additional management support is needed unless otherwise documented below in the visit note. 

## 2017-05-18 NOTE — Progress Notes (Addendum)
Subjective:   [name] [name] is a 83 y.o. female who presents for Medicare Annual (Subsequent) preventive examination.  Review of Systems:  No ROS.  Medicare Wellness Visit. Additional risk factors are reflected in the social history.  Cardiac Risk Factors include: advanced age (>46men, >20 women);hypertension;dyslipidemia Sleep patterns: has frequent nighttime awakenings, feels rested on waking, gets up 1 times nightly to void and sleeps 5 hours nightly.  Patient reports insomnia issues, discussed recommended sleep tips and stress reduction tips, education was attached to patient's AVS.  Home Safety/Smoke Alarms: Feels safe in home. Smoke alarms in place.  Living environment; residence and Firearm Safety: 2-story house, no firearms. Alone ,Seat Belt Safety/Bike Helmet: Wears seat belt.   Counseling:   Eye Exam- appointment yearly  Dental- appointment yearly   Female:   Pap- N/A     Mammo- Last 12/20/16, BI-RADS category 2: benign      Dexa scan- N/D, declined referral today      CCS- N/A    Objective:     Vitals: BP (!) 144/76   Pulse 64   Resp 20   Ht 5\' 5"  (1.651 m)   Wt 146 lb (66.2 kg)   SpO2 98%   BMI 24.30 kg/m   Body mass index is 24.3 kg/m.   Tobacco History  Smoking Status  . Never Smoker  Smokeless Tobacco  . Never Used     Counseling given: Not Answered   Past Medical History:  Diagnosis Date  . A-fib ([place])    paroxysmal  . Aftercare for healing traumatic fracture of arm, unspecified   . H/O: hemorrhoidectomy   . H/O: hysterectomy   . Hyperlipidemia   . Hypertension   . Malignant melanoma of skin of trunk, except scrotum ([place])   . Other postprocedural status(V45.89)   . Rosacea   . S/P cholecystectomy   . Sciatica    right  . Sprain of lumbar region    Past Surgical History:  Procedure Laterality Date  . ABDOMINAL HYSTERECTOMY    . BLADDER SUSPENSION    . catheter ablation of atrail flutter    . CHOLECYSTECTOMY    . HEMORRHOID  SURGERY    . HERNIA REPAIR     Family History  Problem Relation Age of Onset  . Heart failure Mother   . Heart failure Father   . Liver cancer Daughter    History  Sexual Activity  . Sexual activity: No    Outpatient Encounter Prescriptions as of 05/21/2017  Medication Sig  . acetaminophen (TYLENOL) 500 MG tablet Take 1,000 mg by mouth every 6 (six) hours as needed. For pain  . ALPRAZolam (XANAX) 0.25 MG tablet Take 1 tablet (0.25 mg total) by mouth daily as needed for anxiety.  Marland Kitchen atorvastatin (LIPITOR) 20 MG tablet TAKE 1 TABLET AT BEDTIME  . Calcium Carbonate-Vitamin D (CALCIUM-VITAMIN D) 500-200 MG-UNIT per tablet Take 1 tablet by mouth daily.    . digoxin (LANOXIN) 0.125 MG tablet TAKE 1 TABLET BY MOUTH DAILY. PATIENT TO CALL & [facility] APPT BEFORE ANY FUTURE REFILLS [phone]  . ENSURE (ENSURE) Take 237 mLs by mouth.  . hydrochlorothiazide (HYDRODIURIL) 25 MG tablet TAKE 1 TABLET BY MOUTH EVERY DAY  . Hydrocortisone (GERHARDT'S BUTT CREAM) CREA Apply 1 application topically daily as needed for irritation.   . metoprolol (LOPRESSOR) 50 MG tablet TAKE 1/2 TABLET BY MOUTH TWICE DAILY (NEED PHYSICAL IN SEPT)  . Multiple Vitamins-Minerals (MULTIVITAMIN WITH MINERALS) tablet Take 1 tablet by mouth daily.    Marland Kitchen  warfarin (COUMADIN) 2.5 MG tablet TAKE AS DIRECTED BY ANTICOAGULATION CLINIC   No facility-administered encounter medications on file as of 05/21/2017.     Activities of Daily Living In your present state of health, do you have any difficulty performing the following activities: 05/21/2017  Hearing? N  Vision? N  Difficulty concentrating or making decisions? N  Walking or climbing stairs? N  Dressing or bathing? N  Doing errands, shopping? N  Preparing Food and eating ? N  Using the Toilet? N  In the past six months, have you accidently leaked urine? N  Do you have problems with loss of bowel control? N  Managing your Medications? N  Managing your Finances? N  Housekeeping  or managing your Housekeeping? N  Some recent data might be hidden    Patient Care Team: Hoyt Koch, MD as PCP - General (Internal Medicine) Evans Lance, MD (Cardiology) Thalia Bloodgood, OD (Optometry) Paulla Dolly Tamala Fothergill, DPM (Podiatry)    Assessment:    Physical assessment deferred to PCP.  Exercise Activities and Dietary recommendations Current Exercise Habits: Home exercise routine (Discussed Silver Sneakers and water aerobics), Type of exercise: walking;Other - see comments (ROM exercises for shoulder, back ROM exercises provided), Time (Minutes): 30, Frequency (Times/Week): 5, Weekly Exercise (Minutes/Week): 150, Intensity: Mild, Exercise limited by: orthopedic condition(s)  Diet (meal preparation, eat out, water intake, caffeinated beverages, dairy products, fruits and vegetables): in general, a "healthy" diet  , well balanced, diabetic, low fat/ cholesterol, low salt eats a variety of fruits and vegetables daily, limits salt, fat/cholesterol, sugar, caffeine, drinks 2-3 glasses of water daily.  Supplements with ensure, discussed glucerna and coupons provided, encouraged patient to increase daily water intake. Reviewed diabetic diet, Diet education was attached to patient's AVS.   Goals    . <enter goal here>    . Maintian current health status          Stay active and independent, enjoy life, family, and church      Fall Risk Fall Risk  05/21/2017 02/10/2016  Falls in the past year? No No   Depression Screen PHQ 2/9 Scores 05/21/2017 02/10/2016  PHQ - 2 Score 0 0     Cognitive Function MMSE - Mini Mental State Exam 05/21/2017  Orientation to time 5  Orientation to Place 5  Registration 3  Attention/ Calculation 5  Recall 3  Language- name 2 objects 2  Language- repeat 1  Language- follow 3 step command 3  Language- read & follow direction 1  Write a sentence 1  Copy design 1  Total score 30        Immunization History  Administered Date(s)  Administered  . Influenza Whole 09/15/2010  . Influenza, High Dose Seasonal PF 10/20/2013, 08/25/2016  . Influenza,inj,Quad PF,36+ Mos 08/21/2014, 08/13/2015  . Pneumococcal Conjugate-13 08/13/2015  . Pneumococcal Polysaccharide-23 05/27/2012  . Td 05/27/2012   Screening Tests Health Maintenance  Topic Date Due  . DEXA SCAN  10/03/1998  . INFLUENZA VACCINE  06/27/2017  . TETANUS/TDAP  05/27/2022  . PNA vac Low Risk Adult  Completed      Plan:    Continue to eat heart healthy diet (full of fruits, vegetables, whole grains, lean protein, water--limit salt, fat, and sugar intake) and increase physical activity as tolerated.  Continue doing brain stimulating activities (puzzles, reading, adult coloring books, staying active) to keep memory sharp.    I have personally reviewed and noted the following in the patient's chart:   . Medical  and social history . Use of alcohol, tobacco or illicit drugs  . Current medications and supplements . Functional ability and status . Nutritional status . Physical activity . Advanced directives . List of other physicians . Vitals . Screenings to include cognitive, depression, and falls . Referrals and appointments  In addition, I have reviewed and discussed with patient certain preventive protocols, quality metrics, and best practice recommendations. A written personalized care plan for preventive services as well as general preventive health recommendations were provided to patient.     Michiel Cowboy, RN  05/21/2017   Medical screening examination/treatment/procedure(s) were performed by non-physician practitioner and as supervising physician I was immediately available for consultation/collaboration. I agree with above. Scarlette Calico, MD

## 2017-05-21 ENCOUNTER — Ambulatory Visit (INDEPENDENT_AMBULATORY_CARE_PROVIDER_SITE_OTHER): Payer: Medicare Other | Admitting: *Deleted

## 2017-05-21 VITALS — BP 144/76 | HR 64 | Resp 20 | Ht 65.0 in | Wt 146.0 lb

## 2017-05-21 DIAGNOSIS — Z0000 Encounter for general adult medical examination without abnormal findings: Secondary | ICD-10-CM | POA: Diagnosis not present

## 2017-05-21 NOTE — Patient Instructions (Addendum)
Continue to eat heart healthy diet (full of fruits, vegetables, whole grains, lean protein, water--limit salt, fat, and sugar intake) and increase physical activity as tolerated.  Continue doing brain stimulating activities (puzzles, reading, adult coloring books, staying active) to keep memory sharp.    [name] [name] , Thank you for taking time to come for your Medicare Wellness Visit. I appreciate your ongoing commitment to your health goals. Please review the following plan we discussed and let me know if I can assist you in the future.   These are the goals we discussed: Goals    . <enter goal here>    . Maintian current health status          Stay active and independent, enjoy life, family, and church       This is a list of the screening recommended for you and due dates:  Health Maintenance  Topic Date Due  . DEXA scan (bone density measurement)  10/03/1998  . Flu Shot  06/27/2017  . Tetanus Vaccine  05/27/2022  . Pneumonia vaccines  Completed    Insomnia Insomnia is a sleep disorder that makes it difficult to fall asleep or to stay asleep. Insomnia can cause tiredness (fatigue), low energy, difficulty concentrating, mood swings, and poor performance at work or school. There are three different ways to classify insomnia:  Difficulty falling asleep.  Difficulty staying asleep.  Waking up too early in the morning.  Any type of insomnia can be long-term (chronic) or short-term (acute). Both are common. Short-term insomnia usually lasts for three months or less. Chronic insomnia occurs at least three times a week for longer than three months. What are the causes? Insomnia may be caused by another condition, situation, or substance, such as:  Anxiety.  Certain medicines.  Gastroesophageal reflux disease (GERD) or other gastrointestinal conditions.  Asthma or other breathing conditions.  Restless legs syndrome, sleep apnea, or other sleep disorders.  Chronic  pain.  Menopause. This may include hot flashes.  Stroke.  Abuse of alcohol, tobacco, or illegal drugs.  Depression.  Caffeine.  Neurological disorders, such as Alzheimer disease.  An overactive thyroid (hyperthyroidism).  The cause of insomnia may not be known. What increases the risk? Risk factors for insomnia include:  Gender. Women are more commonly affected than men.  Age. Insomnia is more common as you get older.  Stress. This may involve your professional or personal life.  Income. Insomnia is more common in people with lower income.  Lack of exercise.  Irregular work schedule or night shifts.  Traveling between different time zones.  What are the signs or symptoms? If you have insomnia, trouble falling asleep or trouble staying asleep is the main symptom. This may lead to other symptoms, such as:  Feeling fatigued.  Feeling nervous about going to sleep.  Not feeling rested in the morning.  Having trouble concentrating.  Feeling irritable, anxious, or depressed.  How is this treated? Treatment for insomnia depends on the cause. If your insomnia is caused by an underlying condition, treatment will focus on addressing the condition. Treatment may also include:  Medicines to help you sleep.  Counseling or therapy.  Lifestyle adjustments.  Follow these instructions at home:  Take medicines only as directed by your health care provider.  Keep regular sleeping and waking hours. Avoid naps.  Keep a sleep diary to help you and your health care provider figure out what could be causing your insomnia. Include: ? When you sleep. ? When you wake  up during the night. ? How well you sleep. ? How rested you feel the next day. ? Any side effects of medicines you are taking. ? What you eat and drink.  Make your bedroom a comfortable place where it is easy to fall asleep: ? Put up shades or special blackout curtains to block light from outside. ? Use a  white noise machine to block noise. ? Keep the temperature cool.  Exercise regularly as directed by your health care provider. Avoid exercising right before bedtime.  Use relaxation techniques to manage stress. Ask your health care provider to suggest some techniques that may work well for you. These may include: ? Breathing exercises. ? Routines to release muscle tension. ? Visualizing peaceful scenes.  Cut back on alcohol, caffeinated beverages, and cigarettes, especially close to bedtime. These can disrupt your sleep.  Do not overeat or eat spicy foods right before bedtime. This can lead to digestive discomfort that can make it hard for you to sleep.  Limit screen use before bedtime. This includes: ? Watching TV. ? Using your smartphone, tablet, and computer.  Stick to a routine. This can help you fall asleep faster. Try to do a quiet activity, brush your teeth, and go to bed at the same time each night.  Get out of bed if you are still awake after 15 minutes of trying to sleep. Keep the lights down, but try reading or doing a quiet activity. When you feel sleepy, go back to bed.  Make sure that you drive carefully. Avoid driving if you feel very sleepy.  Keep all follow-up appointments as directed by your health care provider. This is important. Contact a health care provider if:  You are tired throughout the day or have trouble in your daily routine due to sleepiness.  You continue to have sleep problems or your sleep problems get worse. Get help right away if:  You have serious thoughts about hurting yourself or someone else. This information is not intended to replace advice given to you by your health care provider. Make sure you discuss any questions you have with your health care provider. Document Released: 11/10/2000 Document Revised: 04/14/2016 Document Reviewed: 08/14/2014 Elsevier Interactive Patient Education  2018 [employer].   Complementary and Alternative  Medical Therapies for Diabetes Complementary and alternative medical therapies are treatments that are different from typical medical treatments (Western treatments). "Complementary" means that the therapy is used with Western treatments. "Alternative" means that the therapy is used instead of Western treatments. Are these therapies safe? Some of these therapies are usually safe. Others may be harmful. Often, there is not enough research to show how safe or effective a therapy is. If you want to try a complementary or alternative therapy, talk with your health care provider to make sure it is safe. What alternative or complementary therapies are used to treat diabetes? Acupuncture Acupuncture is the insertion of needles into certain places on the skin. This is done by a professional. It is often used to relieve long-term (chronic) pain, especially of the bones and joints. It may help you if you have painful nerve damage. Biofeedback Biofeedback helps you to become more aware of your body's response to pain. It also helps you to learn ways of dealing with pain. Biofeedback is about relaxing and reducing stress. An example of a biofeedback technique is guided imagery. This involves creating peaceful images in your mind. Chromium Chromium is a substance that can help improve how insulin works in the  body. Chromium is in many foods, including whole grains, nuts, and egg yolks. Chromium may also be taken as a supplement. Taking chromium supplements may help to control diabetes, especially if you have a lack of chromium (deficiency) in your body. However, research has not proven this. If you have kidney problems, you should be careful with chromium supplements. American ginseng American ginseng is an herb that may lower glucose levels. It may also help lower A1C levels. More research is needed before recommendations for ginseng use can be made. Magnesium Magnesium is a mineral found in many foods, such as  whole grains, nuts, and green leafy vegetables. Having low magnesium levels may make controlling blood glucose more difficult for people who have type 2 diabetes. Low magnesium levels may also contribute to certain diabetes complications. Getting more magnesium and eating a high-fiber diet may reduce the risk of developing type 2 diabetes. Vanadium Vanadium is a compound found in small amounts in certain plants and animals. Some studies show that it improves glucose levels in animals with diabetes. In one study, people with diabetes were able to lower their insulin dosage when taking vanadium. More research about side effects and safe dosage levels is needed. Cinnamon Cinnamon may decrease insulin resistance, increase insulin production, and lower blood glucose levels. It may work best when used with diabetes medicines. Fenugreek Fenugreek is an herb whose seeds are often used in cooking. It may help lower blood glucose by decreasing carbohydrate absorption and increasing insulin production. Summary  Talk with your health care provider about complementary or alternative therapy for you. Some therapies may be appropriate for you, but others may cause side effects.  Follow your diabetes care plan as prescribed. This information is not intended to replace advice given to you by your health care provider. Make sure you discuss any questions you have with your health care provider. Document Released: 09/10/2007 Document Revised: 11/29/2016 Document Reviewed: 11/29/2016 Elsevier Interactive Patient Education  2017 [employer]. Diabetes Mellitus and Food It is important for you to manage your blood sugar (glucose) level. Your blood glucose level can be greatly affected by what you eat. Eating healthier foods in the appropriate amounts throughout the day at about the same time each day will help you control your blood glucose level. It can also help slow or prevent worsening of your diabetes mellitus.  Healthy eating may even help you improve the level of your blood pressure and reach or maintain a healthy weight. General recommendations for healthful eating and cooking habits include:  Eating meals and snacks regularly. Avoid going long periods of time without eating to lose weight.  Eating a diet that consists mainly of plant-based foods, such as fruits, vegetables, nuts, legumes, and whole grains.  Using low-heat cooking methods, such as baking, instead of high-heat cooking methods, such as deep frying.  Work with your dietitian to make sure you understand how to use the Nutrition Facts information on food labels. How can food affect me? Carbohydrates Carbohydrates affect your blood glucose level more than any other type of food. Your dietitian will help you determine how many carbohydrates to eat at each meal and teach you how to count carbohydrates. Counting carbohydrates is important to keep your blood glucose at a healthy level, especially if you are using insulin or taking certain medicines for diabetes mellitus. Alcohol Alcohol can cause sudden decreases in blood glucose (hypoglycemia), especially if you use insulin or take certain medicines for diabetes mellitus. Hypoglycemia can be a life-threatening condition.  Symptoms of hypoglycemia (sleepiness, dizziness, and disorientation) are similar to symptoms of having too much alcohol. If your health care provider has given you approval to drink alcohol, do so in moderation and use the following guidelines:  Women should not have more than one drink per day, and men should not have more than two drinks per day. One drink is equal to: ? 12 oz of beer. ? 5 oz of [place]aglio. ? 1 oz of hard liquor.  Do not drink on an empty stomach.  Keep yourself hydrated. Have water, diet soda, or unsweetened iced tea.  Regular soda, juice, and other mixers might contain a lot of carbohydrates and should be counted.  What foods are not recommended? As  you make food choices, it is important to remember that all foods are not the same. Some foods have fewer nutrients per serving than other foods, even though they might have the same number of calories or carbohydrates. It is difficult to get your body what it needs when you eat foods with fewer nutrients. Examples of foods that you should avoid that are high in calories and carbohydrates but low in nutrients include:  Trans fats (most processed foods list trans fats on the Nutrition Facts label).  Regular soda.  Juice.  Candy.  Sweets, such as cake, pie, doughnuts, and cookies.  Fried foods.  What foods can I eat? Eat nutrient-rich foods, which will nourish your body and keep you healthy. The food you should eat also will depend on several factors, including:  The calories you need.  The medicines you take.  Your weight.  Your blood glucose level.  Your blood pressure level.  Your cholesterol level.  You should eat a variety of foods, including:  Protein. ? Lean cuts of meat. ? Proteins low in saturated fats, such as fish, egg whites, and beans. Avoid processed meats.  Fruits and vegetables. ? Fruits and vegetables that may help control blood glucose levels, such as apples, mangoes, and yams.  Dairy products. ? Choose fat-free or low-fat dairy products, such as milk, yogurt, and cheese.  Grains, bread, pasta, and rice. ? Choose whole grain products, such as multigrain bread, whole oats, and brown rice. These foods may help control blood pressure.  Fats. ? Foods containing healthful fats, such as nuts, avocado, olive oil, canola oil, and fish.  Does everyone with diabetes mellitus have the same meal plan? Because every person with diabetes mellitus is different, there is not one meal plan that works for everyone. It is very important that you meet with a dietitian who will help you create a meal plan that is just right for you. This information is not intended to  replace advice given to you by your health care provider. Make sure you discuss any questions you have with your health care provider. Document Released: 08/10/2005 Document Revised: 04/20/2016 Document Reviewed: 10/10/2013 Elsevier Interactive Patient Education  2017 [employer].

## 2017-06-01 ENCOUNTER — Ambulatory Visit (INDEPENDENT_AMBULATORY_CARE_PROVIDER_SITE_OTHER): Payer: Medicare Other | Admitting: General Practice

## 2017-06-01 DIAGNOSIS — Z7901 Long term (current) use of anticoagulants: Secondary | ICD-10-CM | POA: Diagnosis not present

## 2017-06-01 DIAGNOSIS — I4891 Unspecified atrial fibrillation: Secondary | ICD-10-CM

## 2017-06-01 LAB — POCT INR: INR: 2.7

## 2017-06-01 NOTE — Progress Notes (Signed)
I have reviewed and agree with the plan. 

## 2017-06-01 NOTE — Patient Instructions (Signed)
Pre visit review using our clinic review tool, if applicable. No additional management support is needed unless otherwise documented below in the visit note. 

## 2017-06-04 ENCOUNTER — Other Ambulatory Visit: Payer: Self-pay | Admitting: Internal Medicine

## 2017-06-29 ENCOUNTER — Ambulatory Visit: Payer: Medicare Other

## 2017-07-02 ENCOUNTER — Other Ambulatory Visit: Payer: Self-pay | Admitting: Internal Medicine

## 2017-07-02 NOTE — Telephone Encounter (Signed)
Routing to cindy, please advise, thanks 

## 2017-07-13 ENCOUNTER — Ambulatory Visit (INDEPENDENT_AMBULATORY_CARE_PROVIDER_SITE_OTHER): Payer: Medicare Other | Admitting: General Practice

## 2017-07-13 DIAGNOSIS — I4891 Unspecified atrial fibrillation: Secondary | ICD-10-CM | POA: Diagnosis not present

## 2017-07-13 DIAGNOSIS — Z7901 Long term (current) use of anticoagulants: Secondary | ICD-10-CM

## 2017-07-13 LAB — POCT INR: INR: 3.2

## 2017-07-13 NOTE — Progress Notes (Signed)
I have reviewed and agree with the plan. 

## 2017-07-13 NOTE — Patient Instructions (Signed)
Pre visit review using our clinic review tool, if applicable. No additional management support is needed unless otherwise documented below in the visit note. 

## 2017-08-10 ENCOUNTER — Ambulatory Visit: Payer: Medicare Other

## 2017-08-15 DIAGNOSIS — L910 Hypertrophic scar: Secondary | ICD-10-CM | POA: Diagnosis not present

## 2017-08-15 DIAGNOSIS — L570 Actinic keratosis: Secondary | ICD-10-CM | POA: Diagnosis not present

## 2017-08-15 DIAGNOSIS — L821 Other seborrheic keratosis: Secondary | ICD-10-CM | POA: Diagnosis not present

## 2017-08-15 DIAGNOSIS — D225 Melanocytic nevi of trunk: Secondary | ICD-10-CM | POA: Diagnosis not present

## 2017-08-15 DIAGNOSIS — D1801 Hemangioma of skin and subcutaneous tissue: Secondary | ICD-10-CM | POA: Diagnosis not present

## 2017-08-15 DIAGNOSIS — L0291 Cutaneous abscess, unspecified: Secondary | ICD-10-CM | POA: Diagnosis not present

## 2017-08-17 ENCOUNTER — Ambulatory Visit: Payer: Medicare Other | Admitting: General Practice

## 2017-08-17 DIAGNOSIS — Z7901 Long term (current) use of anticoagulants: Secondary | ICD-10-CM

## 2017-08-17 DIAGNOSIS — I4891 Unspecified atrial fibrillation: Secondary | ICD-10-CM

## 2017-08-17 LAB — POCT INR: INR: 2.5

## 2017-08-20 ENCOUNTER — Telehealth: Payer: Self-pay | Admitting: Internal Medicine

## 2017-08-20 NOTE — Telephone Encounter (Signed)
New Message     Pt c/o medication issue:  1. Name of Medication:   digoxin (LANOXIN) 0.125 MG tablet TAKE 1 TABLET BY MOUTH DAILY. PATIENT TO CALL & [facility] APPT BEFORE ANY FUTURE REFILLS [phone]   warfarin (COUMADIN) 2.5 MG tablet TAKE AS DIRECTED BY ANTICOAGULATION CLINIC     2. How are you currently taking this medication (dosage and times per day)?  As prescribed   3. Are you having a reaction (difficulty breathing--STAT)?  no  4. What is your medication issue? There is a inter action with a antibiotic she was put on

## 2017-08-20 NOTE — Telephone Encounter (Signed)
Pt calling because she is concerned that her new antibiotic has some interaction with medications she is already on.  Pt states she is taking doxycycline 100 mg bid for a boil.   Pt takes digoxin and warfarin. Per Dr. Marge Duncans issues with dig and doxycycline. Spoke with pharmacy-Pt should have PT/INR rechecked 3 days after starting antibiotic.   Call returned to Pt.  Pt INR managed by her PCP.  Pt states she has a follow up appt with them for her INR.  Notified Pt that Dr. Lovena Le states no interaction with dig.  Pt indicates understanding, thanked this nurse for return call.

## 2017-08-22 ENCOUNTER — Ambulatory Visit (INDEPENDENT_AMBULATORY_CARE_PROVIDER_SITE_OTHER): Payer: Medicare Other | Admitting: General Practice

## 2017-08-22 DIAGNOSIS — I4891 Unspecified atrial fibrillation: Secondary | ICD-10-CM | POA: Diagnosis not present

## 2017-08-22 DIAGNOSIS — Z7901 Long term (current) use of anticoagulants: Secondary | ICD-10-CM | POA: Diagnosis not present

## 2017-08-22 LAB — POCT INR: INR: 2.8

## 2017-08-22 NOTE — Patient Instructions (Addendum)
Pre visit review using our clinic review tool, if applicable. No additional management support is needed unless otherwise documented below in the visit note.   Please take 1 tablet daily until you finish the round of doxycycline.  After you finish then resume taking current dosage of coumadin.

## 2017-08-28 ENCOUNTER — Ambulatory Visit (INDEPENDENT_AMBULATORY_CARE_PROVIDER_SITE_OTHER): Payer: Medicare Other

## 2017-08-28 DIAGNOSIS — Z23 Encounter for immunization: Secondary | ICD-10-CM | POA: Diagnosis not present

## 2017-09-02 ENCOUNTER — Other Ambulatory Visit: Payer: Self-pay | Admitting: Family

## 2017-09-13 ENCOUNTER — Ambulatory Visit (INDEPENDENT_AMBULATORY_CARE_PROVIDER_SITE_OTHER): Payer: Medicare Other | Admitting: Internal Medicine

## 2017-09-13 ENCOUNTER — Encounter: Payer: Self-pay | Admitting: Internal Medicine

## 2017-09-13 DIAGNOSIS — R05 Cough: Secondary | ICD-10-CM | POA: Diagnosis not present

## 2017-09-13 DIAGNOSIS — R059 Cough, unspecified: Secondary | ICD-10-CM

## 2017-09-13 MED ORDER — AZITHROMYCIN 250 MG PO TABS: ORAL_TABLET | ORAL | 0 refills | Status: DC

## 2017-09-13 NOTE — Progress Notes (Signed)
   Subjective:    Patient ID: [name] [name], female    DOB: Apr 08, 1931, 83 y.o.   MRN: [id number]  HPI The patient is an 83 YO female coming in for fever and cough. Started several days ago. Productive with yellow sputum. Some chills as well. Significant SOB with activity. Overall worsening. Took tylenol for the fever this morning which was 101. Also some aching and chills. Possible sick contact. Denies significant sinus drainage or pain.   Review of Systems  Constitutional: Positive for activity change, appetite change, chills, fatigue and fever. Negative for unexpected weight change.  HENT: Positive for congestion. Negative for ear pain, facial swelling, hearing loss, mouth sores, postnasal drip, rhinorrhea, sinus pain, sinus pressure, sore throat, trouble swallowing and voice change.   Eyes: Negative.   Respiratory: Positive for cough, shortness of breath and wheezing. Negative for chest tightness.   Cardiovascular: Negative.   Gastrointestinal: Negative.   Musculoskeletal: Negative.       Objective:   Physical Exam  Constitutional: She is oriented to person, place, and time. She appears well-developed and well-nourished.  HENT:  Head: Normocephalic and atraumatic.  Right Ear: External ear normal.  Left Ear: External ear normal.  Mouth/Throat: Oropharynx is clear and moist.  Eyes: EOM are normal.  Neck: Normal range of motion. No JVD present. No thyromegaly present.  Cardiovascular: Normal rate and regular rhythm.   Pulmonary/Chest: Effort normal.  Able to speak in full sentences, some rhonchi in the bases bilaterally  Abdominal: Soft.  Lymphadenopathy:    She has no cervical adenopathy.  Neurological: She is alert and oriented to person, place, and time.  Skin: Skin is warm and dry.   Vitals:   09/13/17 1405  BP: (!) 144/90  Pulse: (!) 57  Temp: 98.8 F (37.1 C)  TempSrc: Oral  SpO2: 98%  Weight: 142 lb (64.4 kg)  Height: 5\' 5"  (1.651 m)      Assessment & Plan:

## 2017-09-13 NOTE — Assessment & Plan Note (Signed)
Given production and fevers treat with azithromycin. If no improvement 3-4 days add prednisone.

## 2017-09-13 NOTE — Patient Instructions (Signed)
Today take 2 pills of the azithromycin. Days 2-5 take 1 pill daily.

## 2017-09-14 ENCOUNTER — Telehealth: Payer: Self-pay | Admitting: Internal Medicine

## 2017-09-14 NOTE — Telephone Encounter (Signed)
Informed pt ok to take zpack and Digoxin. Patient verbalized understanding and agreeable to plan.

## 2017-09-14 NOTE — Telephone Encounter (Signed)
New message     Pt c/o medication issue:  1. Name of Medication: zpack  2. How are you currently taking this medication (dosage and times per day)?  2 day 1 and then 1 for the next 5 days  3. Are you having a reaction (difficulty breathing--STAT)? No has not started it yet  4. What is your medication issue?  Pt was told by pharmacy that this will interact with her medication??

## 2017-09-17 ENCOUNTER — Telehealth: Payer: Self-pay | Admitting: Internal Medicine

## 2017-09-17 NOTE — Telephone Encounter (Signed)
Pt called in said she started a day late and she still has a low grade fever and not feeling much better.  Dr Sharlet Salina told her she may have to change her meds

## 2017-09-17 NOTE — Telephone Encounter (Signed)
New message    STAT if HR is under 50 or over 120 (normal HR is 60-100 beats per minute)  1) What is your heart rate? 130-152   2) Do you have a log of your heart rate readings (document readings)? No   3) Do you have any other symptoms? Nervous

## 2017-09-17 NOTE — Telephone Encounter (Signed)
Called pt no answer LMOM w/MD response../lmb 

## 2017-09-17 NOTE — Telephone Encounter (Signed)
Call made to Pt.  Pt has been taking antibiotic for a skin infection.  Per Pt her heart has started racing, running between 100-152 at rest.  Pt BP 126/82.  Temp 99.8.  Pt c/o nausea and nervousness.  Pt with chronic afib, on 25 mg lopressor bid.  Will discuss with Dr. Lovena Le.  Pt indicates understanding.

## 2017-09-17 NOTE — Telephone Encounter (Signed)
Message back from Dr. Michaele Offer metoprolol to 1 tab (50 mg) by mouth bid.  Will try to work Pt in this week.  Will send to scheduler.  Pt indicates understanding.

## 2017-09-17 NOTE — Telephone Encounter (Signed)
She should finish medication before we decide to change or not. Is breathing okay?

## 2017-09-20 ENCOUNTER — Telehealth: Payer: Self-pay | Admitting: Internal Medicine

## 2017-09-20 ENCOUNTER — Telehealth: Payer: Self-pay | Admitting: General Practice

## 2017-09-20 NOTE — Telephone Encounter (Signed)
New message   Patient has diarrhea.  Wants to if it is safe for her to take Imodium with medications. Please call

## 2017-09-20 NOTE — Telephone Encounter (Signed)
Advised ok to take Imodium w/ her current medications. Pt thanks me for calling and letting her know. She has close f/u w/ office that follows her Coumadin being that she is taking abx.

## 2017-09-20 NOTE — Telephone Encounter (Signed)
Patient called in to re-schedule coumadin clinic appointment to next Friday, 11/2.  Patient has been sick and has just finished a Z-Pak.  Patient reports having diarrhea and no appetite since Monday.  I reduced coumadin dosage to 2.5 mg daily until appointment next Friday.  I also instructed pt to watch and report any unusual bleeding.  Patient verbalized understanding.

## 2017-09-20 NOTE — Telephone Encounter (Signed)
09-20-17 Called pt to add on for Friday per Sonia Baller, pt thinks she may have the flu and wants to post pone appt. Will see PCP asap, and will call back when/if needed. Pt aware to call if needs [place] before then.

## 2017-09-21 ENCOUNTER — Encounter: Payer: Self-pay | Admitting: Internal Medicine

## 2017-09-21 ENCOUNTER — Ambulatory Visit: Payer: Medicare Other

## 2017-09-21 ENCOUNTER — Ambulatory Visit (INDEPENDENT_AMBULATORY_CARE_PROVIDER_SITE_OTHER): Payer: Medicare Other | Admitting: Internal Medicine

## 2017-09-21 VITALS — BP 138/80 | HR 91 | Resp 16 | Ht 65.0 in | Wt 141.0 lb

## 2017-09-21 DIAGNOSIS — I482 Chronic atrial fibrillation, unspecified: Secondary | ICD-10-CM

## 2017-09-21 MED ORDER — METHYLPREDNISOLONE 4 MG PO TBPK: ORAL_TABLET | ORAL | 0 refills | Status: DC

## 2017-09-21 MED ORDER — METOPROLOL TARTRATE 50 MG PO TABS: 50.0000 mg | ORAL_TABLET | Freq: Two times a day (BID) | ORAL | 3 refills | Status: DC

## 2017-09-21 NOTE — Progress Notes (Signed)
HPI [name] [name] returns today for ongoing evaluation and management of her atrial fibrillation. The patient has a long history of chronic atrial fibrillation, as well as remote sinus node dysfunction and chronic Coumadin therapy. Approximate 2 weeks ago, she began to experience cough shortness of breath and the sensation of wheezing. She had subjective fevers. She was seen in the outpatient setting and was diagnosed with bronchitis. She was treated with azithromycin. Her fever has improved. She was noted to have palpitations and called our office and we recommended up titration of her beta blocker. Her palpitations improved. The patient now notes some difficulty with breathing, and she feels wheezing and can hear her self breeze. She has not had syncope. She denies a productive cough. No rigors. Allergies  Allergen Reactions  . Antihistamines, Diphenhydramine-Type     ALL ANTIHISTAMINES causes her heart to race  . Oxycodone-Acetaminophen Nausea Only     Current Outpatient Prescriptions  Medication Sig Dispense Refill  . acetaminophen (TYLENOL) 500 MG tablet Take 1,000 mg by mouth every 6 (six) hours as needed. For pain    . ALPRAZolam (XANAX) 0.25 MG tablet Take 1 tablet (0.25 mg total) by mouth daily as needed for anxiety. 30 tablet 0  . atorvastatin (LIPITOR) 20 MG tablet TAKE 1 TABLET AT BEDTIME 90 tablet 3  . Calcium Carbonate-Vitamin D (CALCIUM-VITAMIN D) 500-200 MG-UNIT per tablet Take 1 tablet by mouth daily.      Marland Kitchen ENSURE (ENSURE) Take 237 mLs by mouth.    . hydrochlorothiazide (HYDRODIURIL) 25 MG tablet TAKE 1 TABLET BY MOUTH EVERY DAY 90 tablet 3  . Hydrocortisone (GERHARDT'S BUTT CREAM) CREA Apply 1 application topically daily as needed for irritation.     . metoprolol tartrate (LOPRESSOR) 50 MG tablet Take 1 tablet (50 mg total) by mouth 2 (two) times daily. 180 tablet 3  . Multiple Vitamins-Minerals (MULTIVITAMIN WITH MINERALS) tablet Take 1 tablet by mouth daily.      Marland Kitchen  warfarin (COUMADIN) 2.5 MG tablet TAKE AS DIRECTED BY ANTICOAGULATION CLINIC 120 tablet 1  . methylPREDNISolone (MEDROL DOSEPAK) 4 MG TBPK tablet Take as directed on package 21 tablet 0   No current facility-administered medications for this visit.      Past Medical History:  Diagnosis Date  . A-fib ([place])    paroxysmal  . Aftercare for healing traumatic fracture of arm, unspecified   . H/O: hemorrhoidectomy   . H/O: hysterectomy   . Hyperlipidemia   . Hypertension   . Malignant melanoma of skin of trunk, except scrotum ([place])   . Other postprocedural status(V45.89)   . Rosacea   . S/P cholecystectomy   . Sciatica    right  . Sprain of lumbar region     ROS:   All systems reviewed and negative except as noted in the HPI.   Past Surgical History:  Procedure Laterality Date  . ABDOMINAL HYSTERECTOMY    . BLADDER SUSPENSION    . catheter ablation of atrail flutter    . CHOLECYSTECTOMY    . HEMORRHOID SURGERY    . HERNIA REPAIR       Family History  Problem Relation Age of Onset  . Heart failure Mother   . Heart failure Father   . Liver cancer Daughter      Social History   Social History  . Marital status: Widowed    Spouse name: N/A  . Number of children: 6  . Years of education: 10   Occupational  History  . telephone operator Retired    retired   Social History Main Topics  . Smoking status: Never Smoker  . Smokeless tobacco: Never Used  . Alcohol use No  . Drug use: No  . Sexual activity: No   Other Topics Concern  . Not on file   Social History Narrative   10th grade. Married '51.4 sons- '52, '54, '58, '70; 2 daughters- '56, '64 older girl died of ovarian cancer.11 grandchildren, 2 great grands. Retired Primary school teacher. ACP - wishes full code status: CPR, mechanical ventilator and all heroic measures.      BP 138/80   Pulse 91   Resp 16   Ht 5\' 5"  (1.651 m)   Wt 141 lb (64 kg)   SpO2 93%   BMI 23.46 kg/m   Physical Exam:  Well  appearing 83 year old man, NAD HEENT: Unremarkable Neck:  6 cm JVD, no thyromegally Lymphatics:  No adenopathy Back:  No CVA tenderness Lungs:  Scattered wheezes, rhonchi, and rales throughout. No increased work of breathing HEART:  IRegular rate rhythm, no murmurs, no rubs, no clicks Abd:  soft, positive bowel sounds, no organomegally, no rebound, no guarding Ext:  2 plus pulses, no edema, no cyanosis, no clubbing Skin:  No rashes no nodules Neuro:  CN II through XII intact, motor grossly intact  EKG - atrial fibrillation with a controlled ventricular response   Assess/Plan: 1. Atrial fibrillation - her ventricular rate is under better control today though not perfect. She will continue her higher dose of beta blocker. 2. Upper respiratory illness - the patient has improved her fever but continues to have very poor pulmonary toilet. I would prefer her to get her treatment from her primary doctor, but because of the late nature of the day on Friday afternoon, I will prescribe her a Medrol dosepak. I have asked the patient to follow-up with her primary physician on Monday or Tuesday to see how she is doing. I considered having the patient obtain a chest x-ray, but I will defer this to her follow-up visit early next week. 3. Hypertension - her blood pressure is reasonably well controlled. No change in medical therapy 4. Dyslipidemia - she will continue statin therapy.

## 2017-09-21 NOTE — Telephone Encounter (Signed)
Pt with appt to see Dr. Lovena Le today 09/21/2017.  No further action needed at this time.

## 2017-09-21 NOTE — Patient Instructions (Addendum)
Medication Instructions:  Your physician has recommended you make the following change in your medication:  1.  Stop digoxin 2.  Continue taking metoprolol 50 mg (one tablet) by mouth twice a day. 3.   We have ordered you a steroid taper.  Take as directed by your pharmacist.  Labwork: None ordered.  Testing/Procedures: None ordered.  Follow-Up: Your physician wants you to follow-up in: first part of December.   Any Other Special Instructions Will Be Listed Below (If Applicable).   If you need a refill on your cardiac medications before your next appointment, please call your pharmacy.

## 2017-09-25 NOTE — Telephone Encounter (Signed)
LVM for patient to call back and let me know what exactly she is inquiring about with the steroid pack

## 2017-09-25 NOTE — Telephone Encounter (Signed)
Pt called and she finished her Zpak and and she is now on a steroid pack. Her cardiologists prescribed this for her and she would like a call back in regard.  Please advise and call back.

## 2017-09-25 NOTE — Telephone Encounter (Signed)
It is okay to take mucinex. Recommend to schedule repeat visit for evaluation if not improved with steroid pack.

## 2017-09-25 NOTE — Telephone Encounter (Signed)
Patient states that she can still hear a humming in her chest at times causing her nit to be able to take a good deep breath. States the phlegm is breaking up and is coughing up mucus. Wants to know if she can take mucinex? Heart doctor put her on a steroid pack and she finishing it this Thursday. States she feels a little better but not a whole lot. Wants to know what you want her to do. Does she need to come back for a visit?

## 2017-09-25 NOTE — Telephone Encounter (Signed)
Patient notified and stated understanding.

## 2017-09-28 ENCOUNTER — Ambulatory Visit (INDEPENDENT_AMBULATORY_CARE_PROVIDER_SITE_OTHER): Payer: Medicare Other | Admitting: General Practice

## 2017-09-28 DIAGNOSIS — Z7901 Long term (current) use of anticoagulants: Secondary | ICD-10-CM

## 2017-09-28 DIAGNOSIS — I4891 Unspecified atrial fibrillation: Secondary | ICD-10-CM

## 2017-09-28 LAB — POCT INR: INR: 4.5

## 2017-09-28 NOTE — Patient Instructions (Signed)
Pre visit review using our clinic review tool, if applicable. No additional management support is needed unless otherwise documented below in the visit note. 

## 2017-09-29 ENCOUNTER — Encounter (HOSPITAL_BASED_OUTPATIENT_CLINIC_OR_DEPARTMENT_OTHER): Payer: Self-pay

## 2017-09-29 ENCOUNTER — Emergency Department (HOSPITAL_BASED_OUTPATIENT_CLINIC_OR_DEPARTMENT_OTHER)
Admission: EM | Admit: 2017-09-29 | Discharge: 2017-09-29 | Disposition: A | Discharge status: Home / Self Care | Payer: Medicare Other | Attending: Physician Assistant | Admitting: Physician Assistant

## 2017-09-29 ENCOUNTER — Emergency Department (HOSPITAL_BASED_OUTPATIENT_CLINIC_OR_DEPARTMENT_OTHER): Payer: Medicare Other

## 2017-09-29 ENCOUNTER — Encounter: Payer: Self-pay | Admitting: Family Medicine

## 2017-09-29 ENCOUNTER — Encounter (HOSPITAL_BASED_OUTPATIENT_CLINIC_OR_DEPARTMENT_OTHER): Payer: Self-pay | Admitting: *Deleted

## 2017-09-29 ENCOUNTER — Other Ambulatory Visit: Payer: Self-pay | Admitting: Family Medicine

## 2017-09-29 ENCOUNTER — Ambulatory Visit (INDEPENDENT_AMBULATORY_CARE_PROVIDER_SITE_OTHER): Payer: Medicare Other | Admitting: Family Medicine

## 2017-09-29 ENCOUNTER — Inpatient Hospital Stay (HOSPITAL_BASED_OUTPATIENT_CLINIC_OR_DEPARTMENT_OTHER): Admission: RE | Admit: 2017-09-29 | Payer: Medicare Other | Source: Ambulatory Visit

## 2017-09-29 VITALS — BP 134/86 | HR 67 | Temp 97.9°F | Wt 139.1 lb

## 2017-09-29 DIAGNOSIS — I4891 Unspecified atrial fibrillation: Secondary | ICD-10-CM | POA: Insufficient documentation

## 2017-09-29 DIAGNOSIS — R0602 Shortness of breath: Secondary | ICD-10-CM | POA: Diagnosis not present

## 2017-09-29 DIAGNOSIS — R059 Cough, unspecified: Secondary | ICD-10-CM

## 2017-09-29 DIAGNOSIS — I10 Essential (primary) hypertension: Secondary | ICD-10-CM | POA: Insufficient documentation

## 2017-09-29 DIAGNOSIS — E785 Hyperlipidemia, unspecified: Secondary | ICD-10-CM | POA: Diagnosis not present

## 2017-09-29 DIAGNOSIS — Z7901 Long term (current) use of anticoagulants: Secondary | ICD-10-CM | POA: Insufficient documentation

## 2017-09-29 DIAGNOSIS — Z79899 Other long term (current) drug therapy: Secondary | ICD-10-CM | POA: Insufficient documentation

## 2017-09-29 DIAGNOSIS — R05 Cough: Secondary | ICD-10-CM

## 2017-09-29 DIAGNOSIS — R062 Wheezing: Secondary | ICD-10-CM | POA: Insufficient documentation

## 2017-09-29 DIAGNOSIS — R058 Other specified cough: Secondary | ICD-10-CM

## 2017-09-29 LAB — COMPREHENSIVE METABOLIC PANEL
ALT: 108 U/L — ABNORMAL HIGH (ref 14–54)
AST: 49 U/L — ABNORMAL HIGH (ref 15–41)
Albumin: 3.9 g/dL (ref 3.5–5.0)
Alkaline Phosphatase: 96 U/L (ref 38–126)
Anion gap: 9 (ref 5–15)
BUN: 15 mg/dL (ref 6–20)
CO2: 28 mmol/L (ref 22–32)
Calcium: 9.8 mg/dL (ref 8.9–10.3)
Chloride: 101 mmol/L (ref 101–111)
Creatinine, Ser: 0.63 mg/dL (ref 0.44–1.00)
GFR calc Af Amer: >60 mL/min (ref >60)
GFR calc non Af Amer: >60 mL/min (ref >60)
Glucose, Bld: 155 mg/dL — ABNORMAL HIGH (ref 65–99)
Potassium: 4.3 mmol/L (ref 3.5–5.1)
Sodium: 138 mmol/L (ref 135–145)
Total Bilirubin: 3 mg/dL — ABNORMAL HIGH (ref 0.3–1.2)
Total Protein: 7.6 g/dL (ref 6.5–8.1)

## 2017-09-29 LAB — URINALYSIS, ROUTINE W REFLEX MICROSCOPIC
Bilirubin Urine: NEGATIVE
Glucose, UA: NEGATIVE mg/dL
Hgb urine dipstick: NEGATIVE
Ketones, ur: NEGATIVE mg/dL
Leukocytes, UA: NEGATIVE
Nitrite: NEGATIVE
Protein, ur: NEGATIVE mg/dL
Specific Gravity, Urine: 1.01 (ref 1.005–1.030)
pH: 7 (ref 5.0–8.0)

## 2017-09-29 LAB — CBC WITH DIFFERENTIAL/PLATELET
Basophils Absolute: 0 10*3/uL (ref 0.0–0.1)
Basophils Relative: 0 %
Eosinophils Absolute: 0.2 10*3/uL (ref 0.0–0.7)
Eosinophils Relative: 1 %
HCT: 50.9 % — ABNORMAL HIGH (ref 36.0–46.0)
Hemoglobin: 17.3 g/dL — ABNORMAL HIGH (ref 12.0–15.0)
Lymphocytes Relative: 24 %
Lymphs Abs: 2.8 10*3/uL (ref 0.7–4.0)
MCH: 30.4 pg (ref 26.0–34.0)
MCHC: 34 g/dL (ref 30.0–36.0)
MCV: 89.3 fL (ref 78.0–100.0)
Monocytes Absolute: 1.1 10*3/uL — ABNORMAL HIGH (ref 0.1–1.0)
Monocytes Relative: 10 %
Neutro Abs: 7.3 10*3/uL (ref 1.7–7.7)
Neutrophils Relative %: 65 %
Platelets: 345 10*3/uL (ref 150–400)
RBC: 5.7 MIL/uL — ABNORMAL HIGH (ref 3.87–5.11)
RDW: 13.4 % (ref 11.5–15.5)
WBC: 11.3 10*3/uL — ABNORMAL HIGH (ref 4.0–10.5)

## 2017-09-29 LAB — PROTIME-INR
INR: 3.07
Prothrombin Time: 31.5 seconds — ABNORMAL HIGH (ref 11.4–15.2)

## 2017-09-29 MED ORDER — AEROCHAMBER PLUS FLO-VU MEDIUM MISC
1.0000 | Freq: Once | Status: AC
  Administered 2017-09-29: 1
  Filled 2017-09-29: qty 1

## 2017-09-29 MED ORDER — ALBUTEROL SULFATE HFA 108 (90 BASE) MCG/ACT IN AERS
2.0000 | INHALATION_SPRAY | Freq: Once | RESPIRATORY_TRACT | Status: AC
  Administered 2017-09-29: 2 via RESPIRATORY_TRACT
  Filled 2017-09-29: qty 6.7

## 2017-09-29 MED ORDER — IPRATROPIUM-ALBUTEROL 0.5-2.5 (3) MG/3ML IN SOLN
3.0000 mL | Freq: Once | RESPIRATORY_TRACT | Status: AC
  Administered 2017-09-29: 3 mL via RESPIRATORY_TRACT
  Filled 2017-09-29: qty 3

## 2017-09-29 NOTE — Patient Instructions (Signed)
I agree that you likely need a chest x-ray and perhaps labs. I would recommend that you go to an emergency dept- you might try the [facility]  Address: [address] [place], [place] [address]  I hope that you feel better very son!

## 2017-09-29 NOTE — ED Provider Notes (Signed)
[facility] EMERGENCY DEPARTMENT Provider Note   CSN: [id number] Arrival date & time: 09/29/17  1047     History   Chief Complaint Chief Complaint  Patient presents with  . Shortness of Breath    HPI [name] [name] is a 83 y.o. female.  HPI   Patient is a 83 year old female.  Patient was seen in 17 October and diagnosed with commute acquired pneumonia, given a Z-Pak.  Patient finished the Z-Pak but was concerned about the interaction with digoxin so she went to Dr. Lovena Le, her A. fib physician.  He took her off digoxin and gave her a steroid taper for the cough.  She finished a steroid taper  yesterday and still has coughing and wheezing to the right lung.  She went to her primary care today and was sent here for further evaluation x-ray and labs.  Patient has no chest pain does have cough wheezing and fatigue.  Patient has lost several pounds in the last couple weeks with this illness.  Past Medical History:  Diagnosis Date  . A-fib ([place])    paroxysmal  . Aftercare for healing traumatic fracture of arm, unspecified   . H/O: hemorrhoidectomy   . H/O: hysterectomy   . Hyperlipidemia   . Hypertension   . Malignant melanoma of skin of trunk, except scrotum ([place])   . Other postprocedural status(V45.89)   . Rosacea   . S/P cholecystectomy   . Sciatica    right  . Sprain of lumbar region     Patient Active Problem List   Diagnosis Date Noted  . Cough 09/13/2017  . Long term (current) use of anticoagulants [Z79.01] 05/04/2017  . Anxiety 05/02/2017  . Routine general medical examination at a health care facility 08/13/2015  . Tremor of hands and face 10/21/2013  . Routine health maintenance 05/28/2012  . Skin lesion 10/07/2008  . SCIATICA, RIGHT 06/03/2008  . Hyperlipidemia 09/06/2007  . Essential hypertension 09/06/2007  . Atrial fibrillation ([place]) 09/06/2007    Past Surgical History:  Procedure Laterality Date  . ABDOMINAL HYSTERECTOMY    . BLADDER  SUSPENSION    . catheter ablation of atrail flutter    . CHOLECYSTECTOMY    . HEMORRHOID SURGERY    . HERNIA REPAIR      OB History    No data available       Home Medications    Prior to Admission medications   Medication Sig Start Date End Date Taking? Authorizing Provider  acetaminophen (TYLENOL) 500 MG tablet Take 1,000 mg by mouth every 6 (six) hours as needed. For pain   Yes [provider]  ALPRAZolam (XANAX) 0.25 MG tablet Take 1 tablet (0.25 mg total) by mouth daily as needed for anxiety. 05/02/17  Yes Hoyt Koch, MD  atorvastatin (LIPITOR) 20 MG tablet TAKE 1 TABLET AT BEDTIME 02/26/17  Yes Hoyt Koch, MD  Calcium Carbonate-Vitamin D (CALCIUM-VITAMIN D) 500-200 MG-UNIT per tablet Take 1 tablet by mouth daily.     Yes [provider]  ENSURE (ENSURE) Take 237 mLs by mouth.   Yes [provider]  hydrochlorothiazide (HYDRODIURIL) 25 MG tablet TAKE 1 TABLET BY MOUTH EVERY DAY 10/24/16  Yes Hoyt Koch, MD  Hydrocortisone (GERHARDT'S BUTT CREAM) CREA Apply 1 application topically daily as needed for irritation.    Yes [provider]  metoprolol tartrate (LOPRESSOR) 50 MG tablet Take 1 tablet (50 mg total) by mouth 2 (two) times daily. 09/21/17  Yes Cristopher Peru  W, MD  Multiple Vitamins-Minerals (MULTIVITAMIN WITH MINERALS) tablet Take 1 tablet by mouth daily.     Yes [provider]  warfarin (COUMADIN) 2.5 MG tablet TAKE AS DIRECTED BY ANTICOAGULATION CLINIC 07/02/17  Yes Hoyt Koch, MD  methylPREDNISolone (MEDROL DOSEPAK) 4 MG TBPK tablet Take as directed on package Patient not taking: Reported on 09/29/2017 09/21/17   Evans Lance, MD    Family History Family History  Problem Relation Age of Onset  . Heart failure Mother   . Heart failure Father   . Liver cancer Daughter     Social History Social History  Substance Use Topics  . Smoking status: Never Smoker  . Smokeless tobacco:  Never Used  . Alcohol use No     Allergies   Antihistamines, diphenhydramine-type and Oxycodone-acetaminophen   Review of Systems Review of Systems  Constitutional: Negative for activity change and fever.  Respiratory: Positive for cough, shortness of breath and wheezing. Negative for chest tightness.   Cardiovascular: Negative for chest pain.  Gastrointestinal: Negative for abdominal pain.     Physical Exam Updated Vital Signs BP (!) 143/93   Pulse 78   Temp 98.3 F (36.8 C) (Oral)   Resp (!) 22   Ht 5\' 5"  (1.651 m)   Wt 63 kg (139 lb)   SpO2 97%   BMI 23.13 kg/m   Physical Exam  Constitutional: She is oriented to person, place, and time. She appears well-developed and well-nourished.  HENT:  Head: Normocephalic and atraumatic.  Eyes: Right eye exhibits no discharge. Left eye exhibits no discharge.  Cardiovascular:  Irregularly irregular.  Pulmonary/Chest: Effort normal. No respiratory distress. She has wheezes.  Mild wheezing and tightness of the right upper lung.  Neurological: She is oriented to person, place, and time. No cranial nerve deficit.  Skin: Skin is warm and dry. She is not diaphoretic.  Psychiatric: She has a normal mood and affect.  Nursing note and vitals reviewed.    ED Treatments / Results  Labs (all labs ordered are listed, but only abnormal results are displayed) Labs Reviewed  CBC WITH DIFFERENTIAL/PLATELET - Abnormal; Notable for the following:       Result Value   WBC 11.3 (*)    RBC 5.70 (*)    Hemoglobin 17.3 (*)    HCT 50.9 (*)    Monocytes Absolute 1.1 (*)    All other components within normal limits  COMPREHENSIVE METABOLIC PANEL - Abnormal; Notable for the following:    Glucose, Bld 155 (*)    AST 49 (*)    ALT 108 (*)    Total Bilirubin 3.0 (*)    All other components within normal limits  PROTIME-INR - Abnormal; Notable for the following:    Prothrombin Time 31.5 (*)    All other components within normal limits    URINALYSIS, ROUTINE W REFLEX MICROSCOPIC    EKG  EKG Interpretation None       Radiology Dg Chest 2 View  Result Date: 09/29/2017 CLINICAL DATA:  Pt stated that she has had a dry persistent cough for over a month. Pt stated that she can also hear wheezing on the right side, SOB, and had a fever during the past month too. EXAM: CHEST  2 VIEW COMPARISON:  07/17/2014 FINDINGS: Cardiac silhouette is borderline enlarged. No mediastinal or hilar masses. No convincing adenopathy. Lungs are hyperexpanded, but clear. No pleural effusion or pneumothorax. Stable changes from ORIF of a proximal left humerus fracture. Skeletal structures are  demineralized. IMPRESSION: No acute cardiopulmonary disease Electronically Signed   By: Lajean Manes M.D.   On: 09/29/2017 11:49    Procedures Procedures (including critical care time)  Medications Ordered in ED Medications  AEROCHAMBER PLUS FLO-VU MEDIUM MISC 1 each (not administered)  albuterol (PROVENTIL HFA;VENTOLIN HFA) 108 (90 Base) MCG/ACT inhaler 2 puff (not administered)  ipratropium-albuterol (DUONEB) 0.5-2.5 (3) MG/3ML nebulizer solution 3 mL (3 mLs Nebulization Given by Other 09/29/17 1153)     Initial Impression / Assessment and Plan / ED Course  I have reviewed the triage vital signs and the nursing notes.  Pertinent labs & imaging results that were available during my care of the patient were reviewed by me and considered in my medical decision making (see chart for details).     Patient is a 83 year old female.  Patient was seen in 17 October and diagnosed with commute acquired pneumonia, given a Z-Pak.  Patient finished the Z-Pak but was concerned about the interaction with digoxin so she went to Dr. Lovena Le, her A. fib physician.  He took her off digoxin and gave her a steroid taper for the cough.  She finished a steroid taper  yesterday and still has coughing and wheezing to the right lung.  She went to her primary care today and was sent  here for further evaluation x-ray and labs.  Patient has no chest pain does have cough wheezing and fatigue.  Patient has lost several pounds in the last couple weeks with this illness  11:28 AM Will get chest x-ray.  Concern because of the wheezing.  That she may have residual pneumonia.  1:29 PM Chest x-ray looks clear.  Responded well to the albuterol.  We will give her albuterol for home.  Otherwise patient has normal vital signs, able to walk without desaturation, is eating and drinking and labs appear reassuring.  We will have her follow-up in 1 week to 2 weeks time.  I think this is likely just a post viral cough that has extended for period of time but given that patient is elderly, could be concerned about neoplastic process we will have her return for report repeat evaluation to primary care physician.  Final Clinical Impressions(s) / ED Diagnoses   Final diagnoses:  None    New Prescriptions New Prescriptions   No medications on file     Macarthur Critchley, MD 09/29/17 1331

## 2017-09-29 NOTE — ED Notes (Signed)
Pt discharged to home with family. NAD.  

## 2017-09-29 NOTE — ED Notes (Signed)
Patient transported to X-ray 

## 2017-09-29 NOTE — Discharge Instructions (Signed)
Please use the inhaler with the spacer to help with symptoms.  Please follow-up with your primary in 1-2 weeks time.  We anticipate able to be getting better by then if not they will may want more advanced imaging. We have printed out a copy of your labs as requested.  Return with any concerns.

## 2017-09-29 NOTE — ED Triage Notes (Signed)
Patient states has a three week history of non-productive cough, sob and fatigue.  States she was seen by her PCP and treated with Doxycycline first and a Z-pack and steroids with no relief.  Was seen today by Dr. Lorelei Pont and seen here for chest xray and labs.

## 2017-09-29 NOTE — ED Notes (Signed)
ED Provider at bedside. 

## 2017-09-29 NOTE — ED Notes (Signed)
Pt ambulated with cane from home and without difficulty -- sats 94-96% with HR in 80s-90s.

## 2017-09-29 NOTE — Progress Notes (Signed)
[place] [facility], [place], [place] [address] [phone]  Date:  09/29/2017   Name:  [name] [name] [facility]   DOB:  [birth year]   MRN:  [id number]  PCP:  Hoyt Koch, MD    Chief Complaint: Cough (Symptoms X three weeks. )   History of Present Illness:  [name] [name] is a 83 y.o. very pleasant female patient who presents with the following:  Elderly lady with illness for about 3.5 weeks overall History of a fib, HTN  She saw her PCP Dr. Sharlet Salina on 10/18 with cough, fever; was given a zpack on 10/18- she then saw Dr. Lovena Le on 10/26 and was given a steroid pack by Dr. Lovena Le Dr. Lovena Le stopped her digoxin at that visit as well Her son notes that she had some diarrhea when she was on the azithromcyin, but this has gone away over the last few days She was also recently treated with doxycycline for another infection and had diarrhea then as well Her last fever was at least 5 days ago No chills or body aches Her cough is non- productive No vomiting She feels a bit nauseated. Is able to eat pretty well but not normally   She is a never smoker He son is here with her today and helps give the history  They are worried that [name] [name] needs a chest x-ray and labs, neither of which I can do here today She continues to cough and feel somewhat weak.     Patient Active Problem List   Diagnosis Date Noted  . Cough 09/13/2017  . Long term (current) use of anticoagulants [Z79.01] 05/04/2017  . Anxiety 05/02/2017  . Routine general medical examination at a health care facility 08/13/2015  . Tremor of hands and face 10/21/2013  . Routine health maintenance 05/28/2012  . Skin lesion 10/07/2008  . SCIATICA, RIGHT 06/03/2008  . Hyperlipidemia 09/06/2007  . Essential hypertension 09/06/2007  . Atrial fibrillation ([place]) 09/06/2007    Past Medical History:  Diagnosis Date  . A-fib ([place])    paroxysmal  . Aftercare for  healing traumatic fracture of arm, unspecified   . H/O: hemorrhoidectomy   . H/O: hysterectomy   . Hyperlipidemia   . Hypertension   . Malignant melanoma of skin of trunk, except scrotum ([place])   . Other postprocedural status(V45.89)   . Rosacea   . S/P cholecystectomy   . Sciatica    right  . Sprain of lumbar region     Past Surgical History:  Procedure Laterality Date  . ABDOMINAL HYSTERECTOMY    . BLADDER SUSPENSION    . catheter ablation of atrail flutter    . CHOLECYSTECTOMY    . HEMORRHOID SURGERY    . HERNIA REPAIR      Social History  Substance Use Topics  . Smoking status: Never Smoker  . Smokeless tobacco: Never Used  . Alcohol use No    Family History  Problem Relation Age of Onset  . Heart failure Mother   . Heart failure Father   . Liver cancer Daughter     Allergies  Allergen Reactions  . Antihistamines, Diphenhydramine-Type     ALL ANTIHISTAMINES causes her heart to race  . Oxycodone-Acetaminophen Nausea Only    Medication list has been reviewed and updated.  Current Outpatient Prescriptions on File Prior to Visit  Medication Sig Dispense Refill  . acetaminophen (TYLENOL) 500 MG tablet Take 1,000 mg by  mouth every 6 (six) hours as needed. For pain    . ALPRAZolam (XANAX) 0.25 MG tablet Take 1 tablet (0.25 mg total) by mouth daily as needed for anxiety. 30 tablet 0  . atorvastatin (LIPITOR) 20 MG tablet TAKE 1 TABLET AT BEDTIME 90 tablet 3  . Calcium Carbonate-Vitamin D (CALCIUM-VITAMIN D) 500-200 MG-UNIT per tablet Take 1 tablet by mouth daily.      Marland Kitchen ENSURE (ENSURE) Take 237 mLs by mouth.    . hydrochlorothiazide (HYDRODIURIL) 25 MG tablet TAKE 1 TABLET BY MOUTH EVERY DAY 90 tablet 3  . Hydrocortisone (GERHARDT'S BUTT CREAM) CREA Apply 1 application topically daily as needed for irritation.     . metoprolol tartrate (LOPRESSOR) 50 MG tablet Take 1 tablet (50 mg total) by mouth 2 (two) times daily. 180 tablet 3  . Multiple Vitamins-Minerals  (MULTIVITAMIN WITH MINERALS) tablet Take 1 tablet by mouth daily.      Marland Kitchen warfarin (COUMADIN) 2.5 MG tablet TAKE AS DIRECTED BY ANTICOAGULATION CLINIC 120 tablet 1  . methylPREDNISolone (MEDROL DOSEPAK) 4 MG TBPK tablet Take as directed on package (Patient not taking: Reported on 09/29/2017) 21 tablet 0   No current facility-administered medications on file prior to visit.     Review of Systems:  As per HPI- otherwise negative.   Physical Examination: Vitals:   09/29/17 0927  BP: 134/86  Pulse: 67  Temp: 97.9 F (36.6 C)  SpO2: 93%   Vitals:   09/29/17 0927  Weight: 139 lb 1.9 oz (63.1 kg)   Body mass index is 23.15 kg/m. Ideal Body Weight:    GEN: WDWN, NAD, Non-toxic, A & O x 3, elderly lady, does not appear to feel well HEENT: Atraumatic, Normocephalic. Neck supple. No masses, No LAD.  Bilateral TM wnl, oropharynx normal.  PEERL,EOMI.   Ears and Nose: No external deformity. CV: no murmur, but her pulse is much faster than documented at check in.  Pt states that her pulse fluctuates rapidly due to her a fib PULM: CTA B, no wheezes, crackles, rhonchi. No retractions. No resp. distress. No accessory muscle use. ABD: S, NT, ND, +BS. No rebound. No HSM.   Cough and mild wheeze, ronchi here today EXTR: No c/c/e NEURO Normal gait.  PSYCH: Normally interactive. Conversant. Not depressed or anxious appearing.  Calm demeanor.    Assessment and Plan: Cough  Here today with persistent cough following treatment with steroids and azithromycin She recently had diarrhea following abx so hesitate to re-rx abx without dx of CAP Will refer to ER today for further management as x-ray, labs not available to me today Her son will take her to the ER   Signed Lamar Blinks, MD

## 2017-10-08 ENCOUNTER — Other Ambulatory Visit: Payer: Self-pay | Admitting: Internal Medicine

## 2017-10-09 ENCOUNTER — Ambulatory Visit (INDEPENDENT_AMBULATORY_CARE_PROVIDER_SITE_OTHER): Payer: Medicare Other | Admitting: General Practice

## 2017-10-09 DIAGNOSIS — Z7901 Long term (current) use of anticoagulants: Secondary | ICD-10-CM

## 2017-10-09 LAB — POCT INR: INR: 2

## 2017-10-09 NOTE — Patient Instructions (Addendum)
Pre visit review using our clinic review tool, if applicable. No additional management support is needed unless otherwise documented below in the visit note.  Continue to take 1 tablet all days except 2 tablets on Mondays.  Re-check in 4 weeks in the lab.

## 2017-10-09 NOTE — Progress Notes (Signed)
Medical treatment/procedure(s) were performed by non-physician practitioner and as supervising physician I was immediately available for consultation/collaboration. I agree with above. Aydan Levitz A Tamanna Whitson, MD  

## 2017-10-30 ENCOUNTER — Encounter: Payer: Self-pay | Admitting: Internal Medicine

## 2017-10-30 ENCOUNTER — Ambulatory Visit (INDEPENDENT_AMBULATORY_CARE_PROVIDER_SITE_OTHER): Payer: Medicare Other | Admitting: Internal Medicine

## 2017-10-30 VITALS — BP 140/74 | HR 122 | Ht 64.0 in | Wt 149.0 lb

## 2017-10-30 DIAGNOSIS — R002 Palpitations: Secondary | ICD-10-CM | POA: Diagnosis not present

## 2017-10-30 DIAGNOSIS — I482 Chronic atrial fibrillation, unspecified: Secondary | ICD-10-CM

## 2017-10-30 DIAGNOSIS — I10 Essential (primary) hypertension: Secondary | ICD-10-CM | POA: Diagnosis not present

## 2017-10-30 NOTE — Progress Notes (Signed)
HPI [name] [name] returns today for ongoing evaluation and management of atrial fibrillation.  The patient is an 83 year old woman with hypertension, persistent atrial fibrillation, and dyslipidemia.  I saw her approximately 6 weeks ago and she was in the middle of an upper respiratory infection.  She was treated with antibiotic therapy and steroids and improved.  In the interim she feels well.  She checks her heart rate and blood pressure regularly.  She notes that her heart rate is almost always in the 90s.  She has not had syncope.  She denies peripheral edema.  She has minimal dyspnea with exertion.  Elderly woman, Allergies  Allergen Reactions  . Antihistamines, Diphenhydramine-Type     ALL ANTIHISTAMINES causes her heart to race  . Oxycodone-Acetaminophen Nausea Only     Current Outpatient Medications  Medication Sig Dispense Refill  . acetaminophen (TYLENOL) 500 MG tablet Take 1,000 mg by mouth every 6 (six) hours as needed. For pain    . ALPRAZolam (XANAX) 0.25 MG tablet Take 1 tablet (0.25 mg total) by mouth daily as needed for anxiety. 30 tablet 0  . atorvastatin (LIPITOR) 20 MG tablet TAKE 1 TABLET AT BEDTIME 90 tablet 3  . Calcium Carbonate-Vitamin D (CALCIUM-VITAMIN D) 500-200 MG-UNIT per tablet Take 1 tablet by mouth every other day.     . ENSURE (ENSURE) Take 237 mLs by mouth as needed.     . hydrochlorothiazide (HYDRODIURIL) 25 MG tablet TAKE 1 TABLET BY MOUTH EVERY DAY 90 tablet 1  . Hydrocortisone (GERHARDT'S BUTT CREAM) CREA Apply 1 application topically daily as needed for irritation.     . metoprolol tartrate (LOPRESSOR) 50 MG tablet Take 1 tablet (50 mg total) by mouth 2 (two) times daily. 180 tablet 3  . Multiple Vitamins-Minerals (MULTIVITAMIN WITH MINERALS) tablet Take 1 tablet by mouth daily.      Marland Kitchen warfarin (COUMADIN) 2.5 MG tablet TAKE AS DIRECTED BY ANTICOAGULATION CLINIC 120 tablet 1   No current facility-administered medications for this visit.       Past Medical History:  Diagnosis Date  . A-fib ([place])    paroxysmal  . Aftercare for healing traumatic fracture of arm, unspecified   . H/O: hemorrhoidectomy   . H/O: hysterectomy   . Hyperlipidemia   . Hypertension   . Malignant melanoma of skin of trunk, except scrotum ([place])   . Other postprocedural status(V45.89)   . Rosacea   . S/P cholecystectomy   . Sciatica    right  . Sprain of lumbar region     ROS:   All systems reviewed and negative except as noted in the HPI.   Past Surgical History:  Procedure Laterality Date  . ABDOMINAL HYSTERECTOMY    . BLADDER SUSPENSION    . catheter ablation of atrail flutter    . CHOLECYSTECTOMY    . HEMORRHOID SURGERY    . HERNIA REPAIR       Family History  Problem Relation Age of Onset  . Heart failure Mother   . Heart failure Father   . Liver cancer Daughter      Social History   Socioeconomic History  . Marital status: Widowed    Spouse name: Not on file  . Number of children: 6  . Years of education: 74  . Highest education level: Not on file  Social Needs  . Financial resource strain: Not on file  . Food insecurity - worry: Not on file  . Food insecurity - inability: Not  on file  . Transportation needs - medical: Not on file  . Transportation needs - non-medical: Not on file  Occupational History  . Occupation: IT trainer: RETIRED    Comment: retired  Tobacco Use  . Smoking status: Never Smoker  . Smokeless tobacco: Never Used  Substance and Sexual Activity  . Alcohol use: No  . Drug use: No  . Sexual activity: No  Other Topics Concern  . Not on file  Social History Narrative   10th grade. Married '51.4 sons- '52, '54, '58, '70; 2 daughters- '56, '64 older girl died of ovarian cancer.11 grandchildren, 2 great grands. Retired Primary school teacher. ACP - wishes full code status: CPR, mechanical ventilator and all heroic measures.      BP 140/74   Pulse (!) 122   Ht 5\' 4"   (1.626 m)   Wt 149 lb (67.6 kg)   BMI 25.58 kg/m   Physical Exam:  Well appearing elderly woman, NAD HEENT: Unremarkable Neck: 6 cm JVD, no thyromegally Lymphatics:  No adenopathy Back:  No CVA tenderness Lungs:  Clear, with no wheezes, rales, or rhonchi. HEART:  IRegular rate rhythm, no murmurs, no rubs, no clicks Abd:  soft, positive bowel sounds, no organomegally, no rebound, no guarding Ext:  2 plus pulses, no edema, no cyanosis, no clubbing Skin:  No rashes no nodules Neuro:  CN II through XII intact, motor grossly intact  EKG atrial fibrillation with a rapid ventricular response  Assess/Plan: 1.  Atrial fibrillation with a rapid ventricular response -the patient is asymptomatic and is convinced that her ventricular rate is well controlled at home.  I discussed the symptoms that she might experience and she is instructed to call us if she has any increasing shortness of breath, syncope, or chest pressure. 2.  Hypertension -her systolic blood pressure is elevated a bit today.  I consider up titrating her medications but will hold off for now. 3.  Dyslipidemia -she will continue her statin therapy.  Crissie Sickles, MD

## 2017-10-30 NOTE — Patient Instructions (Addendum)

## 2017-11-02 ENCOUNTER — Ambulatory Visit (INDEPENDENT_AMBULATORY_CARE_PROVIDER_SITE_OTHER): Payer: Medicare Other | Admitting: General Practice

## 2017-11-02 DIAGNOSIS — I4891 Unspecified atrial fibrillation: Secondary | ICD-10-CM | POA: Diagnosis not present

## 2017-11-02 DIAGNOSIS — Z7901 Long term (current) use of anticoagulants: Secondary | ICD-10-CM

## 2017-11-02 LAB — POCT INR: INR: 1.4

## 2017-11-02 NOTE — Progress Notes (Signed)
Medical treatment/procedure(s) were performed by non-physician practitioner and as supervising physician I was immediately available for consultation/collaboration. I agree with above. Elizabeth A Crawford, MD  

## 2017-11-02 NOTE — Patient Instructions (Addendum)
Pre visit review using our clinic review tool, if applicable. No additional management support is needed unless otherwise documented below in the visit note.  Take 2 tablets today and tomorrow (12/7 and 12/8) and then take 1 tablet all days except 2 tablets on Mondays and Fridays.  Re-check in 2 weeks.

## 2017-11-09 ENCOUNTER — Ambulatory Visit: Payer: Medicare Other

## 2017-11-12 ENCOUNTER — Telehealth: Payer: Self-pay | Admitting: Internal Medicine

## 2017-11-12 NOTE — Telephone Encounter (Signed)
Copied from [place] [id number]. Topic: Quick Communication - Rx Refill/Question >> Nov 12, 2017 10:17 AM Arletha Grippe wrote: Has the patient contacted their pharmacy? Yes.     (Agent: If no, request that the patient contact the pharmacy for the refill.)   Preferred Pharmacy (with phone number or street name): [facility] on [address] - pt need refill on  metoprolol tartrate (LOPRESSOR) 50 MG tablet, She sates Dr Lovena Le doubled her dosage and now pt has only 2-3 days left.  She now takes 50 mg in am and 50 mg in pm. She would like 90 day supply.She said that Dr Sharlet Salina handles all her rx refills.   Cb num is [phone]    Agent: Please be advised that RX refills may take up to 3 business days. We ask that you follow-up with your pharmacy.

## 2017-11-12 NOTE — Telephone Encounter (Signed)
Lopressor has refills - message left for pt. In regard to this.

## 2017-11-13 ENCOUNTER — Telehealth: Payer: Self-pay | Admitting: Internal Medicine
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
## 2017-11-13 NOTE — Telephone Encounter (Signed)
Received page from patient regarding heart rates being elevated today. HR's in the 100's frequently today, as fast as 179 bpm. Current 168 bpm. BP currently 168/125. Taking all medications as scheduled. Feeling some palpitations, no chest pain, pressure, shortness of breath, dizziness, lightheadedness. Feels anxious.   Advised patient to take PM dose of metoprolol now. Ok to take an additional dose of metoprolol 50mg  in 2 to 3 hours if HR/BP still elevated. Ok to take dose of PRN xanax as well if she feels as though anxiety is contributing to symptoms. Advised patient to call back or come to ED tonight if symptoms worsen or if HR's remain consistently elevated in the 160's+.  Marcie Mowers, MD Cardiology Fellow, PGY-5

## 2017-11-14 MED ORDER — METOPROLOL TARTRATE 50 MG PO TABS: 50.0000 mg | ORAL_TABLET | Freq: Three times a day (TID) | ORAL | 3 refills | Status: DC

## 2017-11-14 NOTE — Telephone Encounter (Signed)
Per Dr. Michaele Offer metoprolol to 50 mg (one tablet) by mouth TID with meals.  Do not skip any meals.   Call returned to pt.  Notified of new order.  Pt indicates understanding.  Per Pt she will need a new prescription sent to her pharmacy.   All Pt questions answered.  Will refill.  Notified Pt to call if HR continues to be above 100 after med change.  Pt indicates understanding.

## 2017-11-16 ENCOUNTER — Ambulatory Visit (INDEPENDENT_AMBULATORY_CARE_PROVIDER_SITE_OTHER): Payer: Medicare Other | Admitting: General Practice

## 2017-11-16 DIAGNOSIS — Z7901 Long term (current) use of anticoagulants: Secondary | ICD-10-CM | POA: Diagnosis not present

## 2017-11-16 DIAGNOSIS — I4891 Unspecified atrial fibrillation: Secondary | ICD-10-CM

## 2017-11-16 LAB — POCT INR: INR: 2.3

## 2017-11-16 NOTE — Patient Instructions (Addendum)
Pre visit review using our clinic review tool, if applicable. No additional management support is needed unless otherwise documented below in the visit note.  Continue to take 1 tablet all days except 2 tablets on Mondays/ and Fridays.  Re-check in 4 weeks.  

## 2017-11-16 NOTE — Progress Notes (Signed)
Medical treatment/procedure(s) were performed by non-physician practitioner and as supervising physician I was immediately available for consultation/collaboration. I agree with above. Tab Rylee A Markayla Reichart, MD  

## 2017-11-27 HISTORY — PX: OTHER SURGICAL HISTORY: SHX169

## 2017-12-11 ENCOUNTER — Encounter: Payer: Self-pay | Admitting: Internal Medicine

## 2017-12-14 ENCOUNTER — Ambulatory Visit (INDEPENDENT_AMBULATORY_CARE_PROVIDER_SITE_OTHER): Payer: Medicare Other | Admitting: General Practice

## 2017-12-14 DIAGNOSIS — Z7901 Long term (current) use of anticoagulants: Secondary | ICD-10-CM | POA: Diagnosis not present

## 2017-12-14 LAB — POCT INR: INR: 2.1

## 2017-12-14 NOTE — Progress Notes (Signed)
Medical treatment/procedure(s) were performed by non-physician practitioner and as supervising physician I was immediately available for consultation/collaboration. I agree with above. Elizabeth A Crawford, MD  

## 2017-12-14 NOTE — Patient Instructions (Addendum)
Pre visit review using our clinic review tool, if applicable. No additional management support is needed unless otherwise documented below in the visit note.  Continue to take 1 tablet all days except 2 tablets on Mondays/ and Fridays.  Re-check in 4 weeks.  

## 2017-12-21 DIAGNOSIS — Z1231 Encounter for screening mammogram for malignant neoplasm of breast: Secondary | ICD-10-CM | POA: Diagnosis not present

## 2017-12-21 LAB — HM MAMMOGRAPHY

## 2017-12-25 ENCOUNTER — Encounter: Payer: Self-pay | Admitting: Internal Medicine

## 2017-12-25 NOTE — Progress Notes (Signed)
Abstracted and sent to scan  

## 2018-01-06 ENCOUNTER — Other Ambulatory Visit: Payer: Self-pay | Admitting: Internal Medicine

## 2018-01-11 ENCOUNTER — Ambulatory Visit (INDEPENDENT_AMBULATORY_CARE_PROVIDER_SITE_OTHER): Payer: Medicare Other | Admitting: General Practice

## 2018-01-11 ENCOUNTER — Other Ambulatory Visit: Payer: Self-pay | Admitting: General Practice

## 2018-01-11 DIAGNOSIS — Z7901 Long term (current) use of anticoagulants: Secondary | ICD-10-CM | POA: Diagnosis not present

## 2018-01-11 DIAGNOSIS — I4891 Unspecified atrial fibrillation: Secondary | ICD-10-CM | POA: Diagnosis not present

## 2018-01-11 LAB — POCT INR: INR: 1.4

## 2018-01-11 MED ORDER — WARFARIN SODIUM 2.5 MG PO TABS: ORAL_TABLET | ORAL | 1 refills | Status: DC

## 2018-01-11 NOTE — Patient Instructions (Addendum)
Pre visit review using our clinic review tool, if applicable. No additional management support is needed unless otherwise documented below in the visit note.  Take a total of 3 tablets today (2/15) and take 2 tablets tomorrow (2/16) and then continue to take 1 tablet all days except 2 tablets on Mondays and Fridays.  Re-check in 2 weeks.

## 2018-01-11 NOTE — Progress Notes (Signed)
Medical treatment/procedure(s) were performed by non-physician practitioner and as supervising physician I was immediately available for consultation/collaboration. I agree with above. Takita Riecke A Jamell Opfer, MD  

## 2018-01-25 ENCOUNTER — Ambulatory Visit (INDEPENDENT_AMBULATORY_CARE_PROVIDER_SITE_OTHER): Payer: Medicare Other | Admitting: General Practice

## 2018-01-25 DIAGNOSIS — Z7901 Long term (current) use of anticoagulants: Secondary | ICD-10-CM | POA: Diagnosis not present

## 2018-01-25 DIAGNOSIS — I4891 Unspecified atrial fibrillation: Secondary | ICD-10-CM | POA: Diagnosis not present

## 2018-01-25 LAB — POCT INR: INR: 2

## 2018-01-25 NOTE — Progress Notes (Signed)
Medical treatment/procedure(s) were performed by non-physician practitioner and as supervising physician I was immediately available for consultation/collaboration. I agree with above. Carrisa Keller A Laird Runnion, MD  

## 2018-01-25 NOTE — Patient Instructions (Addendum)
Pre visit review using our clinic review tool, if applicable. No additional management support is needed unless otherwise documented below in the visit note.  Continue to take 1 tablet all days except 2 tablets on Mondays/Wednesdays and Fridays.  Re-check in 4 weeks.

## 2018-02-16 ENCOUNTER — Other Ambulatory Visit: Payer: Self-pay | Admitting: Internal Medicine

## 2018-02-22 ENCOUNTER — Ambulatory Visit (INDEPENDENT_AMBULATORY_CARE_PROVIDER_SITE_OTHER): Payer: Medicare Other | Admitting: General Practice

## 2018-02-22 DIAGNOSIS — Z7901 Long term (current) use of anticoagulants: Secondary | ICD-10-CM | POA: Diagnosis not present

## 2018-02-22 DIAGNOSIS — I4891 Unspecified atrial fibrillation: Secondary | ICD-10-CM

## 2018-02-22 LAB — POCT INR: INR: 2.1

## 2018-02-22 NOTE — Progress Notes (Signed)
Medical treatment/procedure(s) were performed by non-physician practitioner and as supervising physician I was immediately available for consultation/collaboration. I agree with above. Brier Reid A Mckenzye Cutright, MD  

## 2018-02-22 NOTE — Patient Instructions (Addendum)
Pre visit review using our clinic review tool, if applicable. No additional management support is needed unless otherwise documented below in the visit note.  Continue to take 1 tablet all days except 2 tablets on Mondays/Wednesdays and Fridays.  Re-check in 4 weeks.

## 2018-03-08 ENCOUNTER — Other Ambulatory Visit: Payer: Self-pay

## 2018-03-08 DIAGNOSIS — D2239 Melanocytic nevi of other parts of face: Secondary | ICD-10-CM | POA: Diagnosis not present

## 2018-03-08 DIAGNOSIS — D485 Neoplasm of uncertain behavior of skin: Secondary | ICD-10-CM | POA: Diagnosis not present

## 2018-03-08 DIAGNOSIS — C44319 Basal cell carcinoma of skin of other parts of face: Secondary | ICD-10-CM | POA: Diagnosis not present

## 2018-03-22 ENCOUNTER — Ambulatory Visit (INDEPENDENT_AMBULATORY_CARE_PROVIDER_SITE_OTHER): Payer: Medicare Other | Admitting: General Practice

## 2018-03-22 DIAGNOSIS — I4891 Unspecified atrial fibrillation: Secondary | ICD-10-CM

## 2018-03-22 DIAGNOSIS — Z7901 Long term (current) use of anticoagulants: Secondary | ICD-10-CM

## 2018-03-22 LAB — POCT INR: INR: 2.2

## 2018-03-22 NOTE — Progress Notes (Signed)
Medical treatment/procedure(s) were performed by non-physician practitioner and as supervising physician I was immediately available for consultation/collaboration. I agree with above. Elizabeth A Crawford, MD  

## 2018-03-22 NOTE — Patient Instructions (Addendum)
Pre visit review using our clinic review tool, if applicable. No additional management support is needed unless otherwise documented below in the visit note.  Continue to take 1 tablet all days except 2 tablets on Mondays/ and Fridays.  Re-check in 4 weeks.

## 2018-04-01 ENCOUNTER — Other Ambulatory Visit: Payer: Self-pay | Admitting: Internal Medicine

## 2018-04-26 ENCOUNTER — Ambulatory Visit (INDEPENDENT_AMBULATORY_CARE_PROVIDER_SITE_OTHER): Payer: Medicare Other | Admitting: General Practice

## 2018-04-26 ENCOUNTER — Telehealth: Payer: Self-pay | Admitting: General Practice

## 2018-04-26 ENCOUNTER — Other Ambulatory Visit: Payer: Self-pay | Admitting: General Practice

## 2018-04-26 DIAGNOSIS — I4891 Unspecified atrial fibrillation: Secondary | ICD-10-CM

## 2018-04-26 DIAGNOSIS — Z7901 Long term (current) use of anticoagulants: Secondary | ICD-10-CM | POA: Diagnosis not present

## 2018-04-26 LAB — POCT INR: INR: 1.8 — AB (ref 2.0–3.0)

## 2018-04-26 NOTE — Patient Instructions (Addendum)
Pre visit review using our clinic review tool, if applicable. No additional management support is needed unless otherwise documented below in the visit note.  Take extra tablet today (5/31) and then continue to take 1 tablet all days except 2 tablets on Mondays/ and Fridays.  Re-check in 4 weeks.

## 2018-04-26 NOTE — Telephone Encounter (Signed)
Patient is requesting Xanax 0.25 mg tablets.  Last filled 04/2017.  Please advise.

## 2018-04-26 NOTE — Telephone Encounter (Signed)
Faxed INR results to [facility] today, 5/31.  INR results are 1.8.  [phone]

## 2018-05-01 DIAGNOSIS — C44319 Basal cell carcinoma of skin of other parts of face: Secondary | ICD-10-CM | POA: Diagnosis not present

## 2018-05-01 MED ORDER — ALPRAZOLAM 0.25 MG PO TABS: 0.2500 mg | ORAL_TABLET | Freq: Every day | ORAL | 0 refills | Status: DC | PRN

## 2018-05-01 NOTE — Telephone Encounter (Signed)
filled

## 2018-05-01 NOTE — Addendum Note (Signed)
Addended by: Felisha Claytor J on: 05/01/2018 01:56 PM   Modules accepted: Orders  

## 2018-05-01 NOTE — Telephone Encounter (Signed)
Can you advise on refill request in PCP absence?

## 2018-05-11 ENCOUNTER — Other Ambulatory Visit: Payer: Self-pay | Admitting: Internal Medicine

## 2018-05-24 ENCOUNTER — Ambulatory Visit (INDEPENDENT_AMBULATORY_CARE_PROVIDER_SITE_OTHER): Payer: Medicare Other | Admitting: General Practice

## 2018-05-24 DIAGNOSIS — Z7901 Long term (current) use of anticoagulants: Secondary | ICD-10-CM

## 2018-05-24 DIAGNOSIS — I4891 Unspecified atrial fibrillation: Secondary | ICD-10-CM

## 2018-05-24 LAB — POCT INR: INR: 2.5 (ref 2.0–3.0)

## 2018-05-24 NOTE — Progress Notes (Signed)
Medical treatment/procedure(s) were performed by non-physician practitioner and as supervising physician I was immediately available for consultation/collaboration. I agree with above. Callen Vancuren A Tanequa Kretz, MD  

## 2018-05-24 NOTE — Patient Instructions (Addendum)
Pre visit review using our clinic review tool, if applicable. No additional management support is needed unless otherwise documented below in the visit note.  Continue to take 1 tablet all days except 2 tablets on Mondays/ and Fridays.  Re-check in 4 weeks.

## 2018-06-21 ENCOUNTER — Ambulatory Visit (INDEPENDENT_AMBULATORY_CARE_PROVIDER_SITE_OTHER): Payer: Medicare Other | Admitting: General Practice

## 2018-06-21 DIAGNOSIS — Z7901 Long term (current) use of anticoagulants: Secondary | ICD-10-CM | POA: Diagnosis not present

## 2018-06-21 DIAGNOSIS — I4891 Unspecified atrial fibrillation: Secondary | ICD-10-CM

## 2018-06-21 LAB — POCT INR: INR: 2 (ref 2.0–3.0)

## 2018-06-21 NOTE — Patient Instructions (Addendum)
Pre visit review using our clinic review tool, if applicable. No additional management support is needed unless otherwise documented below in the visit note.  Continue to take 1 tablet all days except 2 tablets on Mondays/ and Fridays.  Re-check in 4 weeks.  

## 2018-06-21 NOTE — Progress Notes (Signed)
Medical treatment/procedure(s) were performed by non-physician practitioner and as supervising physician I was immediately available for consultation/collaboration. I agree with above. Elizabeth A Crawford, MD  

## 2018-07-08 ENCOUNTER — Other Ambulatory Visit: Payer: Self-pay | Admitting: Internal Medicine

## 2018-07-16 DIAGNOSIS — L304 Erythema intertrigo: Secondary | ICD-10-CM | POA: Diagnosis not present

## 2018-07-16 DIAGNOSIS — Z85828 Personal history of other malignant neoplasm of skin: Secondary | ICD-10-CM | POA: Diagnosis not present

## 2018-07-16 DIAGNOSIS — D225 Melanocytic nevi of trunk: Secondary | ICD-10-CM | POA: Diagnosis not present

## 2018-07-16 DIAGNOSIS — L570 Actinic keratosis: Secondary | ICD-10-CM | POA: Diagnosis not present

## 2018-07-16 DIAGNOSIS — D1801 Hemangioma of skin and subcutaneous tissue: Secondary | ICD-10-CM | POA: Diagnosis not present

## 2018-07-16 DIAGNOSIS — L821 Other seborrheic keratosis: Secondary | ICD-10-CM | POA: Diagnosis not present

## 2018-07-19 ENCOUNTER — Ambulatory Visit (INDEPENDENT_AMBULATORY_CARE_PROVIDER_SITE_OTHER): Payer: Medicare Other | Admitting: General Practice

## 2018-07-19 DIAGNOSIS — I4891 Unspecified atrial fibrillation: Secondary | ICD-10-CM

## 2018-07-19 DIAGNOSIS — Z7901 Long term (current) use of anticoagulants: Secondary | ICD-10-CM

## 2018-07-19 LAB — POCT INR: INR: 2.6 (ref 2.0–3.0)

## 2018-07-19 NOTE — Patient Instructions (Addendum)
Pre visit review using our clinic review tool, if applicable. No additional management support is needed unless otherwise documented below in the visit note.  Continue to take 1 tablet all days except 2 tablets on Mondays/ and Fridays.  Re-check in 6 weeks.  

## 2018-07-19 NOTE — Progress Notes (Signed)
Medical treatment/procedure(s) were performed by non-physician practitioner and as supervising physician I was immediately available for consultation/collaboration. I agree with above. Elizabeth A Crawford, MD  

## 2018-08-12 ENCOUNTER — Other Ambulatory Visit: Payer: Self-pay | Admitting: Internal Medicine

## 2018-08-13 ENCOUNTER — Ambulatory Visit (INDEPENDENT_AMBULATORY_CARE_PROVIDER_SITE_OTHER)
Admission: RE | Admit: 2018-08-13 | Discharge: 2018-08-13 | Disposition: A | Discharge status: Home / Self Care | Payer: Medicare Other | Source: Ambulatory Visit | Attending: Internal Medicine | Admitting: Internal Medicine

## 2018-08-13 ENCOUNTER — Other Ambulatory Visit (INDEPENDENT_AMBULATORY_CARE_PROVIDER_SITE_OTHER): Payer: Medicare Other

## 2018-08-13 ENCOUNTER — Encounter: Payer: Self-pay | Admitting: Internal Medicine

## 2018-08-13 ENCOUNTER — Ambulatory Visit (INDEPENDENT_AMBULATORY_CARE_PROVIDER_SITE_OTHER): Payer: Medicare Other | Admitting: Internal Medicine

## 2018-08-13 VITALS — BP 124/82 | HR 105 | Temp 98.6°F | Ht 64.0 in | Wt 162.0 lb

## 2018-08-13 DIAGNOSIS — F419 Anxiety disorder, unspecified: Secondary | ICD-10-CM

## 2018-08-13 DIAGNOSIS — R739 Hyperglycemia, unspecified: Secondary | ICD-10-CM

## 2018-08-13 DIAGNOSIS — I10 Essential (primary) hypertension: Secondary | ICD-10-CM | POA: Diagnosis not present

## 2018-08-13 DIAGNOSIS — E785 Hyperlipidemia, unspecified: Secondary | ICD-10-CM | POA: Diagnosis not present

## 2018-08-13 DIAGNOSIS — Z0000 Encounter for general adult medical examination without abnormal findings: Secondary | ICD-10-CM

## 2018-08-13 DIAGNOSIS — E2839 Other primary ovarian failure: Secondary | ICD-10-CM

## 2018-08-13 LAB — COMPREHENSIVE METABOLIC PANEL
ALT: 32 U/L (ref 0–35)
AST: 34 U/L (ref 0–37)
Albumin: 4.3 g/dL (ref 3.5–5.2)
Alkaline Phosphatase: 107 U/L (ref 39–117)
BUN: 14 mg/dL (ref 6–23)
CO2: 26 mEq/L (ref 19–32)
Calcium: 10.5 mg/dL (ref 8.4–10.5)
Chloride: 102 mEq/L (ref 96–112)
Creatinine, Ser: 0.66 mg/dL (ref 0.40–1.20)
GFR: 90.5 mL/min (ref >60.00)
Glucose, Bld: 115 mg/dL — ABNORMAL HIGH (ref 70–99)
Potassium: 3.9 mEq/L (ref 3.5–5.1)
Sodium: 140 mEq/L (ref 135–145)
Total Bilirubin: 3.3 mg/dL — ABNORMAL HIGH (ref 0.2–1.2)
Total Protein: 7.9 g/dL (ref 6.0–8.3)

## 2018-08-13 LAB — LIPID PANEL
Cholesterol: 154 mg/dL (ref 0–200)
HDL: 58.3 mg/dL (ref >39.00)
LDL Cholesterol: 77 mg/dL (ref 0–99)
NonHDL: 95.66
Total CHOL/HDL Ratio: 3
Triglycerides: 91 mg/dL (ref 0.0–149.0)
VLDL: 18.2 mg/dL (ref 0.0–40.0)

## 2018-08-13 LAB — HEMOGLOBIN A1C: Hgb A1c MFr Bld: 6.9 % — ABNORMAL HIGH (ref 4.6–6.5)

## 2018-08-13 LAB — CBC
HCT: 47.1 % — ABNORMAL HIGH (ref 36.0–46.0)
Hemoglobin: 16.2 g/dL — ABNORMAL HIGH (ref 12.0–15.0)
MCHC: 34.4 g/dL (ref 30.0–36.0)
MCV: 89.8 fl (ref 78.0–100.0)
Platelets: 250 10*3/uL (ref 150.0–400.0)
RBC: 5.24 Mil/uL — ABNORMAL HIGH (ref 3.87–5.11)
RDW: 13.7 % (ref 11.5–15.5)
WBC: 6.6 10*3/uL (ref 4.0–10.5)

## 2018-08-13 NOTE — Patient Instructions (Signed)

## 2018-08-13 NOTE — Progress Notes (Signed)
   Subjective:    Patient ID: [name] [name] [facility], female    DOB: September 01, 1931, 84 y.o.   MRN: [id number]  HPI Here for medicare wellness, no new complaints. Please see A/P for status and treatment of chronic medical problems.   HPI #2: Here for follow up anxiety (taking xanax 2-3 times per month to help with stress, she usually tries to use alternative methods to relieve stress, some breathing techniques), hypertension (BP at goal, taking hctz and metoprolol and mostly feeling good, some fatigue at times, rare dizziness with standing too quickly, passes in less than 1 minute, denies chest pains or headaches) and hyperlipidemia (taking lipitor, denies muscle aches or cramps, denies chest pains or stroke symptoms, taking regularly).   Diet: heart healthy Physical activity: some activity Depression/mood screen: negative Hearing: intact to whispered voice Visual acuity: grossly normal, performs annual eye exam  ADLs: capable Fall risk: none Home safety: good Cognitive evaluation: intact to orientation, naming, recall and repetition EOL planning: adv directives discussed  I have personally reviewed and have noted 1. The patient's medical and social history - reviewed today no changes 2. Their use of alcohol, tobacco or illicit drugs 3. Their current medications and supplements 4. The patient's functional ability including ADL's, fall risks, home safety risks and hearing or visual impairment. 5. Diet and physical activities 6. Evidence for depression or mood disorders 7. Care team reviewed and updated (available in snapshot)  Review of Systems  Constitutional: Negative.   HENT: Negative.   Eyes: Negative.   Respiratory: Negative for cough, chest tightness and shortness of breath.   Cardiovascular: Negative for chest pain, palpitations and leg swelling.  Gastrointestinal: Negative for abdominal distention, abdominal pain, constipation, diarrhea, nausea and vomiting.  Musculoskeletal: Negative.    Skin: Negative.   Neurological: Negative.   Psychiatric/Behavioral: Negative.       Objective:   Physical Exam  Constitutional: She is oriented to person, place, and time. She appears well-developed and well-nourished.  HENT:  Head: Normocephalic and atraumatic.  Eyes: EOM are normal.  Neck: Normal range of motion.  Cardiovascular: Normal rate and regular rhythm.  Pulmonary/Chest: Effort normal and breath sounds normal. No respiratory distress. She has no wheezes. She has no rales.  Abdominal: Soft. Bowel sounds are normal. She exhibits no distension. There is no tenderness. There is no rebound.  Musculoskeletal: She exhibits no edema.  Neurological: She is alert and oriented to person, place, and time. Coordination normal.  Skin: Skin is warm and dry.  Psychiatric: She has a normal mood and affect.   Vitals:   08/13/18 1439  BP: 124/82  Pulse: (!) 105  Temp: 98.6 F (37 C)  TempSrc: Oral  SpO2: 96%  Weight: 162 lb (73.5 kg)  Height: 5\' 4"  (1.626 m)      Assessment & Plan:

## 2018-08-14 NOTE — Assessment & Plan Note (Signed)
Flu shot declined. Pneumonia complete. Shingrix counseled. Tetanus up to date. Colonoscopy aged out. Mammogram aged out, pap smear aged out and dexa ordered. Counseled about sun safety and mole surveillance. Counseled about the dangers of distracted driving. Given 10 year screening recommendations.

## 2018-08-14 NOTE — Assessment & Plan Note (Signed)
Taking xanax rarely. Advised not to use regularly and talked about risk of side effects such as dependence, falls, memory changes with long term usage. She also uses other methods when stress and anxiety is not severe to overcome this.

## 2018-08-14 NOTE — Assessment & Plan Note (Signed)
Checking lipid panel and adjust lipitor 20 mg daily if needed.  

## 2018-08-14 NOTE — Assessment & Plan Note (Signed)
Taking hctz and metoprolol and BP at goal. Checking CMP and adjust as needed.

## 2018-08-20 ENCOUNTER — Ambulatory Visit (INDEPENDENT_AMBULATORY_CARE_PROVIDER_SITE_OTHER): Payer: Medicare Other | Admitting: Internal Medicine

## 2018-08-20 ENCOUNTER — Encounter: Payer: Self-pay | Admitting: Internal Medicine

## 2018-08-20 VITALS — BP 134/86 | HR 111 | Ht 64.0 in | Wt 162.2 lb

## 2018-08-20 DIAGNOSIS — Z23 Encounter for immunization: Secondary | ICD-10-CM | POA: Diagnosis not present

## 2018-08-20 DIAGNOSIS — I482 Chronic atrial fibrillation, unspecified: Secondary | ICD-10-CM

## 2018-08-20 DIAGNOSIS — E785 Hyperlipidemia, unspecified: Secondary | ICD-10-CM

## 2018-08-20 DIAGNOSIS — I10 Essential (primary) hypertension: Secondary | ICD-10-CM

## 2018-08-20 MED ORDER — METOPROLOL SUCCINATE ER 100 MG PO TB24: 100.0000 mg | ORAL_TABLET | Freq: Every day | ORAL | 3 refills | Status: DC

## 2018-08-20 NOTE — Progress Notes (Signed)
HPI [name] [name] returns today for ongoing evaluation and management of atrial fibrillation.  The patient is an 84 year old woman with hypertension, persistent atrial fibrillation, and dyslipidemia. She has poorly controlled atrial fib although she maintains that her atrial fib is well controlled at home. She denies chest pain or sob. No syncope. She does admit to some non-compliance. Allergies  Allergen Reactions  . Antihistamines, Diphenhydramine-Type     ALL ANTIHISTAMINES causes her heart to race  . Oxycodone-Acetaminophen Nausea Only     Current Outpatient Medications  Medication Sig Dispense Refill  . acetaminophen (TYLENOL) 500 MG tablet Take 1,000 mg by mouth every 6 (six) hours as needed. For pain    . ALPRAZolam (XANAX) 0.25 MG tablet Take 1 tablet (0.25 mg total) by mouth daily as needed for anxiety. 30 tablet 0  . atorvastatin (LIPITOR) 20 MG tablet Take 1 tablet (20 mg total) by mouth at bedtime. Need annual visit for further refills 90 tablet 0  . Calcium Carbonate-Vitamin D (CALCIUM-VITAMIN D) 500-200 MG-UNIT per tablet Take 1 tablet by mouth every other day.     . ENSURE (ENSURE) Take 237 mLs by mouth as needed.     . hydrochlorothiazide (HYDRODIURIL) 25 MG tablet TAKE 1 TABLET BY MOUTH EVERY DAY 90 tablet 1  . Hydrocortisone (GERHARDT'S BUTT CREAM) CREA Apply 1 application topically daily as needed for irritation.     . Multiple Vitamins-Minerals (MULTIVITAMIN WITH MINERALS) tablet Take 1 tablet by mouth daily.      Marland Kitchen warfarin (COUMADIN) 2.5 MG tablet TAKE AS DIRECTED BY ANTICOAGULATION CLINIC 120 tablet 1  . Zinc Oxide (TRIPLE PASTE) 12.8 % ointment Apply 1 application topically as needed for irritation.    . metoprolol tartrate (LOPRESSOR) 50 MG tablet Take 1 tablet (50 mg total) by mouth 3 (three) times daily with meals. 270 tablet 3   No current facility-administered medications for this visit.      Past Medical History:  Diagnosis Date  . A-fib ([place])    paroxysmal  . Aftercare for healing traumatic fracture of arm, unspecified   . H/O: hemorrhoidectomy   . H/O: hysterectomy   . Hyperlipidemia   . Hypertension   . Malignant melanoma of skin of trunk, except scrotum ([place])   . Other postprocedural status(V45.89)   . Rosacea   . S/P cholecystectomy   . Sciatica    right  . Sprain of lumbar region     ROS:   All systems reviewed and negative except as noted in the HPI.   Past Surgical History:  Procedure Laterality Date  . ABDOMINAL HYSTERECTOMY    . BLADDER SUSPENSION     never go surgery because it got canceled due to heart rate   . catheter ablation of atrail flutter    . CHOLECYSTECTOMY    . HEMORRHOID SURGERY    . HERNIA REPAIR       Family History  Problem Relation Age of Onset  . Heart failure Mother   . Heart failure Father   . Liver cancer Daughter      Social History   Socioeconomic History  . Marital status: Widowed    Spouse name: Not on file  . Number of children: 6  . Years of education: 9  . Highest education level: Not on file  Occupational History  . 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 Financial resource strain: Not on file  .  Food insecurity:    Worry: Not on file    Inability: Not on file  . Transportation needs:    Medical: Not on file    Non-medical: Not on file  Tobacco Use  . Smoking status: Never Smoker  . Smokeless tobacco: Never Used  Substance and Sexual Activity  . Alcohol use: No  . Drug use: No  . Sexual activity: Never  Lifestyle  . Physical activity:    Days per week: Not on file    Minutes per session: Not on file  . Stress: Not on file  Relationships  . Social connections:    Talks on phone: Not on file    Gets together: Not on file    Attends religious service: Not on file    Active member of club or organization: Not on file    Attends meetings of clubs or organizations: Not on file    Relationship  status: Not on file  . Intimate partner violence:    Fear of current or ex partner: Not on file    Emotionally abused: Not on file    Physically abused: Not on file    Forced sexual activity: Not on file  Other Topics Concern  . Not on file  Social History Narrative   10th grade. Married '51.4 sons- '52, '54, '58, '70; 2 daughters- '56, '64 older girl died of ovarian cancer.11 grandchildren, 2 great grands. Retired Primary school teacher. ACP - wishes full code status: CPR, mechanical ventilator and all heroic measures.      BP 134/86   Pulse (!) 111   Ht 5\' 4"  (1.626 m)   Wt 162 lb 3.2 oz (73.6 kg)   SpO2 96%   BMI 27.84 kg/m   Physical Exam:  Elderly appearing NAD HEENT: Unremarkable Neck:  No JVD, no thyromegally Lymphatics:  No adenopathy Back:  No CVA tenderness Lungs:  Clear with no wheezes HEART:  IRegular rate rhythm, no murmurs, no rubs, no clicks Abd:  soft, positive bowel sounds, no organomegally, no rebound, no guarding Ext:  2 plus pulses, no edema, no cyanosis, no clubbing Skin:  No rashes no nodules Neuro:  CN II through XII intact, motor grossly intact  EKG - atrial fib with an RVR   Assess/Plan: 1. Chronic atrial fib - her rates are still not well controlled. I have recommended that she switch to toprol 100 mg daily.  2. Chronic diastolic heart failure - her symptoms are class 2. She did not take her meds today. She will take her diuretic when she gets home. 3. HTN - her blood pressure is only minimally elevated.  4. Dyslipidemia - she will continue her atorvastatin.  Mikle Bosworth.D.

## 2018-08-20 NOTE — Patient Instructions (Addendum)
Medication Instructions:  Your physician has recommended you make the following change in your medication:  1.  When your metoprolol tartrate is gone you are CHANGING to:  2.  Toprol XL 100 mg- Take one tablet by mouth daily.  Labwork: None ordered.  Testing/Procedures: None ordered.  Follow-Up: Your physician wants you to follow-up in: one year with Dr. Lovena Le.   You will receive a reminder letter in the mail two months in advance. If you don't receive a letter, please call our office to schedule the follow-up appointment.  Any Other Special Instructions Will Be Listed Below (If Applicable).  If you need a refill on your cardiac medications before your next appointment, please call your pharmacy.

## 2018-08-28 ENCOUNTER — Other Ambulatory Visit: Payer: Self-pay | Admitting: Internal Medicine

## 2018-08-30 ENCOUNTER — Ambulatory Visit (INDEPENDENT_AMBULATORY_CARE_PROVIDER_SITE_OTHER): Payer: Medicare Other | Admitting: General Practice

## 2018-08-30 DIAGNOSIS — I4891 Unspecified atrial fibrillation: Secondary | ICD-10-CM | POA: Diagnosis not present

## 2018-08-30 LAB — POCT INR: INR: 2.1 (ref 2.0–3.0)

## 2018-08-30 NOTE — Patient Instructions (Addendum)
Pre visit review using our clinic review tool, if applicable. No additional management support is needed unless otherwise documented below in the visit note.  Continue to take 1 tablet all days except 2 tablets on Mondays/ and Fridays.  Re-check in 6 weeks.  

## 2018-08-30 NOTE — Progress Notes (Signed)
Medical treatment/procedure(s) were performed by non-physician practitioner and as supervising physician I was immediately available for consultation/collaboration. I agree with above. Elizabeth A Crawford, MD  

## 2018-09-20 ENCOUNTER — Other Ambulatory Visit: Payer: Self-pay | Admitting: Internal Medicine

## 2018-10-04 DIAGNOSIS — H25812 Combined forms of age-related cataract, left eye: Secondary | ICD-10-CM | POA: Diagnosis not present

## 2018-10-11 ENCOUNTER — Ambulatory Visit (INDEPENDENT_AMBULATORY_CARE_PROVIDER_SITE_OTHER): Payer: Medicare Other | Admitting: General Practice

## 2018-10-11 DIAGNOSIS — I4891 Unspecified atrial fibrillation: Secondary | ICD-10-CM

## 2018-10-11 LAB — POCT INR: INR: 2 (ref 2.0–3.0)

## 2018-10-11 NOTE — Progress Notes (Signed)
Medical treatment/procedure(s) were performed by non-physician practitioner and as supervising physician I was immediately available for consultation/collaboration. I agree with above. Jeffie Spivack A Milynn Quirion, MD  

## 2018-10-11 NOTE — Patient Instructions (Addendum)
Pre visit review using our clinic review tool, if applicable. No additional management support is needed unless otherwise documented below in the visit note.  Continue to take 1 tablet all days except 2 tablets on Mondays/ and Fridays.  Re-check in 6 weeks.  

## 2018-11-02 ENCOUNTER — Other Ambulatory Visit: Payer: Self-pay | Admitting: Internal Medicine

## 2018-11-04 DIAGNOSIS — H25812 Combined forms of age-related cataract, left eye: Secondary | ICD-10-CM | POA: Diagnosis not present

## 2018-11-04 DIAGNOSIS — H2512 Age-related nuclear cataract, left eye: Secondary | ICD-10-CM | POA: Diagnosis not present

## 2018-11-04 DIAGNOSIS — Z01818 Encounter for other preprocedural examination: Secondary | ICD-10-CM | POA: Diagnosis not present

## 2018-11-06 NOTE — Telephone Encounter (Signed)
Outpatient Medication Detail    Disp Refills Start End   metoprolol succinate (TOPROL-XL) 100 MG 24 hr tablet 90 tablet 3 08/20/2018 11/18/2018   Sig - Route: Take 1 tablet (100 mg total) by mouth daily. Take with or immediately following a meal. - Oral   Sent to pharmacy as: metoprolol succinate (TOPROL-XL) 100 MG 24 hr tablet   E-Prescribing Status: Receipt confirmed by pharmacy (08/20/2018 9:48 AM EDT)   Pharmacy   [id number] - [place], [place] - [address].

## 2018-11-11 ENCOUNTER — Other Ambulatory Visit: Payer: Self-pay | Admitting: Internal Medicine

## 2018-11-15 ENCOUNTER — Ambulatory Visit (INDEPENDENT_AMBULATORY_CARE_PROVIDER_SITE_OTHER): Payer: Medicare Other | Admitting: General Practice

## 2018-11-15 DIAGNOSIS — I4891 Unspecified atrial fibrillation: Secondary | ICD-10-CM

## 2018-11-15 DIAGNOSIS — Z7901 Long term (current) use of anticoagulants: Secondary | ICD-10-CM | POA: Diagnosis not present

## 2018-11-15 LAB — POCT INR: INR: 2.6 (ref 2.0–3.0)

## 2018-11-15 NOTE — Patient Instructions (Addendum)
Pre visit review using our clinic review tool, if applicable. No additional management support is needed unless otherwise documented below in the visit note.  Continue to take 1 tablet all days except 2 tablets on Mondays/ and Fridays.  Re-check in 6 weeks.  

## 2018-11-15 NOTE — Progress Notes (Signed)
Medical treatment/procedure(s) were performed by non-physician practitioner and as supervising physician I was immediately available for consultation/collaboration. I agree with above. Ardie Dragoo A Kurk Corniel, MD  

## 2018-11-18 DIAGNOSIS — H25811 Combined forms of age-related cataract, right eye: Secondary | ICD-10-CM | POA: Diagnosis not present

## 2018-11-18 DIAGNOSIS — H2511 Age-related nuclear cataract, right eye: Secondary | ICD-10-CM | POA: Diagnosis not present

## 2018-12-23 DIAGNOSIS — Z1231 Encounter for screening mammogram for malignant neoplasm of breast: Secondary | ICD-10-CM | POA: Diagnosis not present

## 2018-12-23 LAB — HM MAMMOGRAPHY

## 2018-12-24 ENCOUNTER — Encounter: Payer: Self-pay | Admitting: Internal Medicine

## 2018-12-24 NOTE — Progress Notes (Signed)
Abstracted and sent to scan  

## 2018-12-27 ENCOUNTER — Ambulatory Visit (INDEPENDENT_AMBULATORY_CARE_PROVIDER_SITE_OTHER): Payer: Medicare Other | Admitting: General Practice

## 2018-12-27 DIAGNOSIS — I4891 Unspecified atrial fibrillation: Secondary | ICD-10-CM

## 2018-12-27 LAB — POCT INR: INR: 2.7 (ref 2.0–3.0)

## 2018-12-27 NOTE — Progress Notes (Signed)
Medical treatment/procedure(s) were performed by non-physician practitioner and as supervising physician I was immediately available for consultation/collaboration. I agree with above. Elizabeth A Crawford, MD  

## 2018-12-27 NOTE — Patient Instructions (Addendum)
Pre visit review using our clinic review tool, if applicable. No additional management support is needed unless otherwise documented below in the visit note.  Continue to take 1 tablet all days except 2 tablets on Mondays/ and Fridays.  Re-check in 6 weeks.  

## 2019-01-01 ENCOUNTER — Other Ambulatory Visit: Payer: Self-pay | Admitting: Internal Medicine

## 2019-01-27 ENCOUNTER — Other Ambulatory Visit: Payer: Self-pay | Admitting: Internal Medicine

## 2019-01-27 NOTE — Telephone Encounter (Signed)
Control database checked last refill: 05/01/2018 LOV:08/13/2018 cpe NOV: 08/15/2019

## 2019-02-07 ENCOUNTER — Other Ambulatory Visit: Payer: Self-pay

## 2019-02-07 ENCOUNTER — Ambulatory Visit (INDEPENDENT_AMBULATORY_CARE_PROVIDER_SITE_OTHER): Payer: Medicare Other | Admitting: General Practice

## 2019-02-07 DIAGNOSIS — Z7901 Long term (current) use of anticoagulants: Secondary | ICD-10-CM

## 2019-02-07 DIAGNOSIS — I4891 Unspecified atrial fibrillation: Secondary | ICD-10-CM | POA: Diagnosis not present

## 2019-02-07 LAB — POCT INR: INR: 1.6 — AB (ref 2.0–3.0)

## 2019-02-07 NOTE — Progress Notes (Signed)
Medical treatment/procedure(s) were performed by non-physician practitioner and as supervising physician I was immediately available for consultation/collaboration. I agree with above. Elizabeth A Crawford, MD  

## 2019-02-07 NOTE — Patient Instructions (Addendum)
Pre visit review using our clinic review tool, if applicable. No additional management support is needed unless otherwise documented below in the visit note.  Take 3 tablets today (3/13) and take 2 tablets tomorrow (3/14) and then continue to take 1 tablet all days except 2 tablets on Mondays/ and Fridays.  Re-check in 3 weeks.

## 2019-02-28 ENCOUNTER — Ambulatory Visit (INDEPENDENT_AMBULATORY_CARE_PROVIDER_SITE_OTHER): Payer: Medicare Other | Admitting: General Practice

## 2019-02-28 DIAGNOSIS — I4891 Unspecified atrial fibrillation: Secondary | ICD-10-CM | POA: Diagnosis not present

## 2019-02-28 LAB — POCT INR: INR: 2.4 (ref 2.0–3.0)

## 2019-02-28 NOTE — Progress Notes (Signed)
Medical treatment/procedure(s) were performed by non-physician practitioner and as supervising physician I was immediately available for consultation/collaboration. I agree with above. Elizabeth A Crawford, MD  

## 2019-02-28 NOTE — Patient Instructions (Addendum)
Pre visit review using our clinic review tool, if applicable. No additional management support is needed unless otherwise documented below in the visit note.  Continue to take 1 tablet all days except 2 tablets on Mondays/ and Fridays.  Re-check in 6 weeks.

## 2019-03-15 ENCOUNTER — Other Ambulatory Visit: Payer: Self-pay | Admitting: Internal Medicine

## 2019-04-11 ENCOUNTER — Ambulatory Visit (INDEPENDENT_AMBULATORY_CARE_PROVIDER_SITE_OTHER): Payer: Medicare Other | Admitting: General Practice

## 2019-04-11 ENCOUNTER — Other Ambulatory Visit: Payer: Self-pay

## 2019-04-11 DIAGNOSIS — I4891 Unspecified atrial fibrillation: Secondary | ICD-10-CM | POA: Diagnosis not present

## 2019-04-11 LAB — POCT INR: INR: 2 (ref 2.0–3.0)

## 2019-04-11 NOTE — Patient Instructions (Addendum)
Pre visit review using our clinic review tool, if applicable. No additional management support is needed unless otherwise documented below in the visit note.  Continue to take 1 tablet all days except 2 tablets on Mondays/ and Fridays.  Re-check in 6 weeks.

## 2019-04-11 NOTE — Progress Notes (Signed)
Medical treatment/procedure(s) were performed by non-physician practitioner and as supervising physician I was immediately available for consultation/collaboration. I agree with above. Aharon Carriere A Rosiland Sen, MD  

## 2019-05-23 ENCOUNTER — Ambulatory Visit (INDEPENDENT_AMBULATORY_CARE_PROVIDER_SITE_OTHER): Payer: Medicare Other | Admitting: General Practice

## 2019-05-23 ENCOUNTER — Other Ambulatory Visit: Payer: Self-pay

## 2019-05-23 DIAGNOSIS — I4891 Unspecified atrial fibrillation: Secondary | ICD-10-CM

## 2019-05-23 DIAGNOSIS — Z7901 Long term (current) use of anticoagulants: Secondary | ICD-10-CM | POA: Diagnosis not present

## 2019-05-23 LAB — POCT INR: INR: 2.6 (ref 2.0–3.0)

## 2019-05-23 NOTE — Patient Instructions (Addendum)
Pre visit review using our clinic review tool, if applicable. No additional management support is needed unless otherwise documented below in the visit note.  Continue to take 1 tablet all days except 2 tablets on Mondays/ and Fridays.  Re-check in 6 weeks.

## 2019-07-04 ENCOUNTER — Ambulatory Visit (INDEPENDENT_AMBULATORY_CARE_PROVIDER_SITE_OTHER): Payer: Medicare Other | Admitting: General Practice

## 2019-07-04 ENCOUNTER — Other Ambulatory Visit: Payer: Self-pay

## 2019-07-04 DIAGNOSIS — I4891 Unspecified atrial fibrillation: Secondary | ICD-10-CM

## 2019-07-04 LAB — POCT INR: INR: 2.3 (ref 2.0–3.0)

## 2019-07-04 NOTE — Patient Instructions (Addendum)
Pre visit review using our clinic review tool, if applicable. No additional management support is needed unless otherwise documented below in the visit note.  Continue to take 1 tablet all days except 2 tablets on Mondays/ and Fridays.  Re-check in 6 weeks.

## 2019-07-05 ENCOUNTER — Other Ambulatory Visit: Payer: Self-pay | Admitting: Internal Medicine

## 2019-07-22 ENCOUNTER — Other Ambulatory Visit: Payer: Self-pay | Admitting: Internal Medicine

## 2019-07-23 ENCOUNTER — Other Ambulatory Visit: Payer: Self-pay | Admitting: Internal Medicine

## 2019-08-08 ENCOUNTER — Other Ambulatory Visit: Payer: Self-pay | Admitting: Internal Medicine

## 2019-08-15 ENCOUNTER — Ambulatory Visit (INDEPENDENT_AMBULATORY_CARE_PROVIDER_SITE_OTHER): Payer: Medicare Other | Admitting: Internal Medicine

## 2019-08-15 ENCOUNTER — Encounter: Payer: Self-pay | Admitting: Internal Medicine

## 2019-08-15 ENCOUNTER — Ambulatory Visit (INDEPENDENT_AMBULATORY_CARE_PROVIDER_SITE_OTHER): Payer: Medicare Other | Admitting: General Practice

## 2019-08-15 ENCOUNTER — Other Ambulatory Visit: Payer: Self-pay

## 2019-08-15 ENCOUNTER — Other Ambulatory Visit (INDEPENDENT_AMBULATORY_CARE_PROVIDER_SITE_OTHER): Payer: Medicare Other

## 2019-08-15 VITALS — BP 130/30 | HR 150 | Temp 98.5°F | Ht 64.0 in | Wt 161.0 lb

## 2019-08-15 DIAGNOSIS — Z0000 Encounter for general adult medical examination without abnormal findings: Secondary | ICD-10-CM

## 2019-08-15 DIAGNOSIS — I4891 Unspecified atrial fibrillation: Secondary | ICD-10-CM

## 2019-08-15 DIAGNOSIS — E782 Mixed hyperlipidemia: Secondary | ICD-10-CM

## 2019-08-15 DIAGNOSIS — L989 Disorder of the skin and subcutaneous tissue, unspecified: Secondary | ICD-10-CM | POA: Diagnosis not present

## 2019-08-15 DIAGNOSIS — F419 Anxiety disorder, unspecified: Secondary | ICD-10-CM | POA: Diagnosis not present

## 2019-08-15 DIAGNOSIS — Z23 Encounter for immunization: Secondary | ICD-10-CM

## 2019-08-15 LAB — COMPREHENSIVE METABOLIC PANEL
ALT: 31 U/L (ref 0–35)
AST: 32 U/L (ref 0–37)
Albumin: 4.4 g/dL (ref 3.5–5.2)
Alkaline Phosphatase: 90 U/L (ref 39–117)
BUN: 20 mg/dL (ref 6–23)
CO2: 26 mEq/L (ref 19–32)
Calcium: 10.6 mg/dL — ABNORMAL HIGH (ref 8.4–10.5)
Chloride: 105 mEq/L (ref 96–112)
Creatinine, Ser: 0.69 mg/dL (ref 0.40–1.20)
GFR: 80.7 mL/min (ref >60.00)
Glucose, Bld: 126 mg/dL — ABNORMAL HIGH (ref 70–99)
Potassium: 4 mEq/L (ref 3.5–5.1)
Sodium: 142 mEq/L (ref 135–145)
Total Bilirubin: 2.6 mg/dL — ABNORMAL HIGH (ref 0.2–1.2)
Total Protein: 7.7 g/dL (ref 6.0–8.3)

## 2019-08-15 LAB — LIPID PANEL
Cholesterol: 143 mg/dL (ref 0–200)
HDL: 63.5 mg/dL (ref >39.00)
LDL Cholesterol: 67 mg/dL (ref 0–99)
NonHDL: 79.13
Total CHOL/HDL Ratio: 2
Triglycerides: 62 mg/dL (ref 0.0–149.0)
VLDL: 12.4 mg/dL (ref 0.0–40.0)

## 2019-08-15 LAB — CBC
HCT: 48.4 % — ABNORMAL HIGH (ref 36.0–46.0)
Hemoglobin: 16.3 g/dL — ABNORMAL HIGH (ref 12.0–15.0)
MCHC: 33.6 g/dL (ref 30.0–36.0)
MCV: 92.2 fl (ref 78.0–100.0)
Platelets: 234 10*3/uL (ref 150.0–400.0)
RBC: 5.25 Mil/uL — ABNORMAL HIGH (ref 3.87–5.11)
RDW: 13.8 % (ref 11.5–15.5)
WBC: 7.3 10*3/uL (ref 4.0–10.5)

## 2019-08-15 LAB — TSH: TSH: 1.53 u[IU]/mL (ref 0.35–4.50)

## 2019-08-15 LAB — POCT INR: INR: 2.1 (ref 2.0–3.0)

## 2019-08-15 MED ORDER — ALPRAZOLAM 0.25 MG PO TABS: 0.2500 mg | ORAL_TABLET | Freq: Every day | ORAL | 0 refills | Status: DC | PRN

## 2019-08-15 NOTE — Assessment & Plan Note (Signed)
Checking lipid panel and adjust as needed. Taking lipitor daily.

## 2019-08-15 NOTE — Patient Instructions (Addendum)
Pre visit review using our clinic review tool, if applicable. No additional management support is needed unless otherwise documented below in the visit note.  Continue to take 1 tablet all days except 2 tablets on Mondays/ and Fridays.  Re-check in 6 weeks.  

## 2019-08-15 NOTE — Assessment & Plan Note (Signed)
Reminded to see dermatology yearly due to history of skin cancer.

## 2019-08-15 NOTE — Progress Notes (Signed)
Subjective:   Patient ID: [name] [name] [facility], female    DOB: 04/27/1931, 85 y.o.   MRN: [id number]  HPI Here for medicare wellness, no new complaints. Please see A/P for status and treatment of chronic medical problems.  HPI #2: Here for follow up A fib (we monitor her coumadin, INR today 2.1, denies blood in stool, HR does elevate when walking, worse since pandemic and her family is not wanting her to leave the house hardly at all, denies chest pains, mild SOB not significantly changed), and cholesterol (taking lipitor 20 mg daily, denies side effects, denies chest pains or stroke symptoms), and anxiety (uses xanax rarely, mostly when she has to go to doctor or dentist or extra social event, denies overuse, denies using more with pandemic and is coping well).   Diet: heart healthy Physical activity: sedentary Depression/mood screen: negative Hearing: intact to whispered voice Visual acuity: grossly normal, performs annual eye exam  ADLs: capable Fall risk: none Home safety: good Cognitive evaluation: intact to orientation, naming, recall and repetition EOL planning: adv directives discussed    Office Visit from 08/15/2019 in [place]  PHQ-2 Total Score  0      I have personally reviewed and have noted 1. The patient's medical and social history - reviewed today no changes 2. Their use of alcohol, tobacco or illicit drugs 3. Their current medications and supplements 4. The patient's functional ability including ADL's, fall risks, home safety risks and hearing or visual impairment. 5. Diet and physical activities 6. Evidence for depression or mood disorders 7. Care team reviewed and updated  Patient Care Team: Hoyt Koch, MD as PCP - General (Internal Medicine) Evans Lance, MD (Cardiology) Thalia Bloodgood, OD (Optometry) Paulla Dolly Tamala Fothergill, DPM (Podiatry) Past Medical History:  Diagnosis Date  . A-fib ([place])    paroxysmal  . Aftercare for  healing traumatic fracture of arm, unspecified   . H/O: hemorrhoidectomy   . H/O: hysterectomy   . Hyperlipidemia   . Hypertension   . Malignant melanoma of skin of trunk, except scrotum ([place])   . Other postprocedural status(V45.89)   . Rosacea   . S/P cholecystectomy   . Sciatica    right  . Sprain of lumbar region    Past Surgical History:  Procedure Laterality Date  . ABDOMINAL HYSTERECTOMY    . BLADDER SUSPENSION     never go surgery because it got canceled due to heart rate   . cataract Bilateral 2019  . catheter ablation of atrail flutter    . CHOLECYSTECTOMY    . HERNIA REPAIR     Family History  Problem Relation Age of Onset  . Heart failure Mother   . Heart failure Father   . Liver cancer Daughter    Review of Systems  Constitutional: Positive for activity change.  HENT: Negative.   Eyes: Negative.   Respiratory: Positive for shortness of breath. Negative for cough and chest tightness.   Cardiovascular: Negative for chest pain, palpitations and leg swelling.  Gastrointestinal: Negative for abdominal distention, abdominal pain, constipation, diarrhea, nausea and vomiting.  Musculoskeletal: Negative.   Skin: Negative.   Neurological: Negative.   Psychiatric/Behavioral: Negative.     Objective:  Physical Exam Constitutional:      Appearance: She is well-developed.  HENT:     Head: Normocephalic and atraumatic.  Neck:     Musculoskeletal: Normal range of motion.  Cardiovascular:     Rate and Rhythm: Normal rate. Rhythm  irregular.     Comments: Rate around 100 Pulmonary:     Effort: Pulmonary effort is normal. No respiratory distress.     Breath sounds: Normal breath sounds. No wheezing or rales.  Abdominal:     General: Bowel sounds are normal. There is no distension.     Palpations: Abdomen is soft.     Tenderness: There is no abdominal tenderness. There is no rebound.  Skin:    General: Skin is warm and dry.  Neurological:     Mental Status: She  is alert and oriented to person, place, and time.     Coordination: Coordination normal.     Vitals:   08/15/19 1508  BP: (!) 130/30  Pulse: (!) 150  Temp: 98.5 F (36.9 C)  TempSrc: Oral  SpO2: 96%  Weight: 161 lb (73 kg)  Height: 5\' 4"  (1.626 m)    Assessment & Plan:  Flu shot given at visit

## 2019-08-15 NOTE — Progress Notes (Signed)
Medical treatment/procedure(s) were performed by non-physician practitioner and as supervising physician I was immediately available for consultation/collaboration. I agree with above. Elizabeth A Crawford, MD  

## 2019-08-15 NOTE — Patient Instructions (Signed)
Health Maintenance, Female Adopting a healthy lifestyle and getting preventive care are important in promoting health and wellness. Ask your health care provider about:  The right schedule for you to have regular tests and exams.  Things you can do on your own to prevent diseases and keep yourself healthy. What should I know about diet, weight, and exercise? Eat a healthy diet   Eat a diet that includes plenty of vegetables, fruits, low-fat dairy products, and lean protein.  Do not eat a lot of foods that are high in solid fats, added sugars, or sodium. Maintain a healthy weight Body mass index (BMI) is used to identify weight problems. It estimates body fat based on height and weight. Your health care provider can help determine your BMI and help you achieve or maintain a healthy weight. Get regular exercise Get regular exercise. This is one of the most important things you can do for your health. Most adults should:  Exercise for at least 150 minutes each week. The exercise should increase your heart rate and make you sweat (moderate-intensity exercise).  Do strengthening exercises at least twice a week. This is in addition to the moderate-intensity exercise.  Spend less time sitting. Even light physical activity can be beneficial. Watch cholesterol and blood lipids Have your blood tested for lipids and cholesterol at 83 years of age, then have this test every 5 years. Have your cholesterol levels checked more often if:  Your lipid or cholesterol levels are high.  You are older than 83 years of age.  You are at high risk for heart disease. What should I know about cancer screening? Depending on your health history and family history, you may need to have cancer screening at various ages. This may include screening for:  Breast cancer.  Cervical cancer.  Colorectal cancer.  Skin cancer.  Lung cancer. What should I know about heart disease, diabetes, and high blood  pressure? Blood pressure and heart disease  High blood pressure causes heart disease and increases the risk of stroke. This is more likely to develop in people who have high blood pressure readings, are of African descent, or are overweight.  Have your blood pressure checked: ? Every 3-5 years if you are 18-39 years of age. ? Every year if you are 40 years old or older. Diabetes Have regular diabetes screenings. This checks your fasting blood sugar level. Have the screening done:  Once every three years after age 40 if you are at a normal weight and have a low risk for diabetes.  More often and at a younger age if you are overweight or have a high risk for diabetes. What should I know about preventing infection? Hepatitis B If you have a higher risk for hepatitis B, you should be screened for this virus. Talk with your health care provider to find out if you are at risk for hepatitis B infection. Hepatitis C Testing is recommended for:  Everyone born from 1945 through 1965.  Anyone with known risk factors for hepatitis C. Sexually transmitted infections (STIs)  Get screened for STIs, including gonorrhea and chlamydia, if: ? You are sexually active and are younger than 83 years of age. ? You are older than 83 years of age and your health care provider tells you that you are at risk for this type of infection. ? Your sexual activity has changed since you were last screened, and you are at increased risk for chlamydia or gonorrhea. Ask your health care provider if   you are at risk.  Ask your health care provider about whether you are at high risk for HIV. Your health care provider may recommend a prescription medicine to help prevent HIV infection. If you choose to take medicine to prevent HIV, you should first get tested for HIV. You should then be tested every 3 months for as long as you are taking the medicine. Pregnancy  If you are about to stop having your period (premenopausal) and  you may become pregnant, seek counseling before you get pregnant.  Take 400 to 800 micrograms (mcg) of folic acid every day if you become pregnant.  Ask for birth control (contraception) if you want to prevent pregnancy. Osteoporosis and menopause Osteoporosis is a disease in which the bones lose minerals and strength with aging. This can result in bone fractures. If you are 65 years old or older, or if you are at risk for osteoporosis and fractures, ask your health care provider if you should:  Be screened for bone loss.  Take a calcium or vitamin D supplement to lower your risk of fractures.  Be given hormone replacement therapy (HRT) to treat symptoms of menopause. Follow these instructions at home: Lifestyle  Do not use any products that contain nicotine or tobacco, such as cigarettes, e-cigarettes, and chewing tobacco. If you need help quitting, ask your health care provider.  Do not use street drugs.  Do not share needles.  Ask your health care provider for help if you need support or information about quitting drugs. Alcohol use  Do not drink alcohol if: ? Your health care provider tells you not to drink. ? You are pregnant, may be pregnant, or are planning to become pregnant.  If you drink alcohol: ? Limit how much you use to 0-1 drink a day. ? Limit intake if you are breastfeeding.  Be aware of how much alcohol is in your drink. In the [place] one drink equals one 12 oz bottle of beer (355 mL), one 5 oz glass of wine (148 mL), or one 1 oz glass of hard liquor (44 mL). General instructions  Schedule regular health, dental, and eye exams.  Stay current with your vaccines.  Tell your health care provider if: ? You often feel depressed. ? You have ever been abused or do not feel safe at home. Summary  Adopting a healthy lifestyle and getting preventive care are important in promoting health and wellness.  Follow your health care provider's instructions about healthy  diet, exercising, and getting tested or screened for diseases.  Follow your health care provider's instructions on monitoring your cholesterol and blood pressure. This information is not intended to replace advice given to you by your health care provider. Make sure you discuss any questions you have with your health care provider. Document Released: 05/29/2011 Document Revised: 11/06/2018 Document Reviewed: 11/06/2018 Elsevier Patient Education  2020 [employer].  

## 2019-08-15 NOTE — Assessment & Plan Note (Signed)
Refill xanax today and she does take rarely. She is reminded about the risks and benefits to this medication as well as the addictive nature of it.

## 2019-08-15 NOTE — Assessment & Plan Note (Signed)
Flu shot given. Pneumonia complete. Shingrix counseled. Tetanus due 2023. Colonoscopy aged out. Mammogram aged out, pap smear aged out and dexa declines further last 2019. Counseled about sun safety and mole surveillance. Counseled about the dangers of distracted driving. Given 10 year screening recommendations.

## 2019-08-15 NOTE — Assessment & Plan Note (Signed)
Coumadin for anticoagulation and normally INR in range. Taking metoprolol for rate control and overall okay but deconditioned currently so HR elevates with normal activity. Seeing cardiology in November and may need adjustment.

## 2019-09-01 DIAGNOSIS — L570 Actinic keratosis: Secondary | ICD-10-CM | POA: Diagnosis not present

## 2019-09-01 DIAGNOSIS — L821 Other seborrheic keratosis: Secondary | ICD-10-CM | POA: Diagnosis not present

## 2019-09-01 DIAGNOSIS — D1801 Hemangioma of skin and subcutaneous tissue: Secondary | ICD-10-CM | POA: Diagnosis not present

## 2019-09-01 DIAGNOSIS — D225 Melanocytic nevi of trunk: Secondary | ICD-10-CM | POA: Diagnosis not present

## 2019-09-01 DIAGNOSIS — Z85828 Personal history of other malignant neoplasm of skin: Secondary | ICD-10-CM | POA: Diagnosis not present

## 2019-09-26 ENCOUNTER — Ambulatory Visit (INDEPENDENT_AMBULATORY_CARE_PROVIDER_SITE_OTHER): Payer: Medicare Other | Admitting: General Practice

## 2019-09-26 ENCOUNTER — Other Ambulatory Visit: Payer: Self-pay

## 2019-09-26 DIAGNOSIS — Z7901 Long term (current) use of anticoagulants: Secondary | ICD-10-CM

## 2019-09-26 LAB — POCT INR: INR: 2 (ref 2.0–3.0)

## 2019-09-26 NOTE — Patient Instructions (Addendum)
Pre visit review using our clinic review tool, if applicable. No additional management support is needed unless otherwise documented below in the visit note.  Continue to take 1 tablet all days except 2 tablets on Mondays/ and Fridays.  Re-check in 6 weeks.  

## 2019-09-26 NOTE — Progress Notes (Signed)
Medical treatment/procedure(s) were performed by non-physician practitioner and as supervising physician I was immediately available for consultation/collaboration. I agree with above. Elizabeth A Crawford, MD  

## 2019-10-08 ENCOUNTER — Other Ambulatory Visit: Payer: Self-pay | Admitting: Internal Medicine

## 2019-10-27 ENCOUNTER — Encounter: Payer: Self-pay | Admitting: Internal Medicine

## 2019-10-27 ENCOUNTER — Other Ambulatory Visit: Payer: Self-pay

## 2019-10-27 ENCOUNTER — Ambulatory Visit (INDEPENDENT_AMBULATORY_CARE_PROVIDER_SITE_OTHER): Payer: Medicare Other | Admitting: Internal Medicine

## 2019-10-27 VITALS — BP 140/68 | HR 126 | Ht 64.0 in | Wt 165.0 lb

## 2019-10-27 DIAGNOSIS — I482 Chronic atrial fibrillation, unspecified: Secondary | ICD-10-CM

## 2019-10-27 DIAGNOSIS — I10 Essential (primary) hypertension: Secondary | ICD-10-CM

## 2019-10-27 DIAGNOSIS — E782 Mixed hyperlipidemia: Secondary | ICD-10-CM

## 2019-10-27 DIAGNOSIS — I5032 Chronic diastolic (congestive) heart failure: Secondary | ICD-10-CM | POA: Diagnosis not present

## 2019-10-27 NOTE — Patient Instructions (Signed)

## 2019-10-27 NOTE — Progress Notes (Signed)
HPI [name] today for ongoing evaluation and management of atrial fibrillation. The patient is an 85 year old woman with hypertension, persistent atrial fibrillation, and dyslipidemia. She has poorly controlled atrial fib although she maintains that her atrial fib is well controlled at home. She denies chest pain or sob. No syncope. She does admit to some non-compliance. Allergies  Allergen Reactions  . Antihistamines, Diphenhydramine-Type     ALL ANTIHISTAMINES causes her heart to race  . Oxycodone-Acetaminophen Nausea Only     Current Outpatient Medications  Medication Sig Dispense Refill  . acetaminophen (TYLENOL) 500 MG tablet Take 1,000 mg by mouth every 6 (six) hours as needed. For pain    . ALPRAZolam (XANAX) 0.25 MG tablet Take 1 tablet (0.25 mg total) by mouth daily as needed for anxiety. 30 tablet 0  . atorvastatin (LIPITOR) 20 MG tablet TAKE 1 TABLET BY MOUTH EVERYDAY AT BEDTIME 90 tablet 0  . Calcium Carbonate-Vitamin D (CALCIUM-VITAMIN D) 500-200 MG-UNIT per tablet Take 1 tablet by mouth every other day.     . ENSURE (ENSURE) Take 237 mLs by mouth as needed.     . hydrochlorothiazide (HYDRODIURIL) 25 MG tablet TAKE 1 TABLET BY MOUTH EVERY DAY 90 tablet 0  . Hydrocortisone (GERHARDT'S BUTT CREAM) CREA Apply 1 application topically daily as needed for irritation.     . metoprolol succinate (TOPROL-XL) 100 MG 24 hr tablet Take 1 tablet (100 mg total) by mouth daily. With or immediately following a meal. Please keep upcoming appt with Dr. Lovena Le in November. Thank you 90 tablet 0  . Multiple Vitamins-Minerals (MULTIVITAMIN WITH MINERALS) tablet Take 1 tablet by mouth daily.      Marland Kitchen warfarin (COUMADIN) 2.5 MG tablet TAKE 1 TABLET DAILY EXCEPT 2 TABS ON MON AND FRI OR AS DIRECTED BY ANTICOAGULATION CLINIC 120 tablet 1  . Zinc Oxide (TRIPLE PASTE) 12.8 % ointment Apply 1 application topically as needed for irritation.     No current facility-administered  medications for this visit.      Past Medical History:  Diagnosis Date  . A-fib ([place])    paroxysmal  . Aftercare for healing traumatic fracture of arm, unspecified   . H/O: hemorrhoidectomy   . H/O: hysterectomy   . Hyperlipidemia   . Hypertension   . Malignant melanoma of skin of trunk, except scrotum ([place])   . Other postprocedural status(V45.89)   . Rosacea   . S/P cholecystectomy   . Sciatica    right  . Sprain of lumbar region     ROS:   All systems reviewed and negative except as noted in the HPI.   Past Surgical History:  Procedure Laterality Date  . ABDOMINAL HYSTERECTOMY    . BLADDER SUSPENSION     never go surgery because it got canceled due to heart rate   . cataract Bilateral 2019  . catheter ablation of atrail flutter    . CHOLECYSTECTOMY    . HERNIA REPAIR       Family History  Problem Relation Age of Onset  . Heart failure Mother   . Heart failure Father   . Liver cancer Daughter      Social History   Socioeconomic History  . Marital status: Widowed    Spouse name: Not on file  . Number of children: 6  . Years of education: 43  . Highest education level: Not on file  Occupational History  . 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 Financial resource strain: Not on file  . Food insecurity    Worry: Not on file    Inability: Not on file  . Transportation needs    Medical: Not on file    Non-medical: Not on file  Tobacco Use  . Smoking status: Never Smoker  . Smokeless tobacco: Never Used  Substance and Sexual Activity  . Alcohol use: No  . Drug use: No  . Sexual activity: Never  Lifestyle  . Physical activity    Days per week: Not on file    Minutes per session: Not on file  . Stress: Not on file  Relationships  . Social Herbalist on phone: Not on file    Gets together: Not on file    Attends religious service: Not on file    Active member of club or organization:  Not on file    Attends meetings of clubs or organizations: Not on file    Relationship status: Not on file  . Intimate partner violence    Fear of current or ex partner: Not on file    Emotionally abused: Not on file    Physically abused: Not on file    Forced sexual activity: Not on file  Other Topics Concern  . Not on file  Social History Narrative   10th grade. Married '51.4 sons- '52, '54, '58, '70; 2 daughters- '56, '64 older girl died of ovarian cancer.11 grandchildren, 2 great grands. Retired Primary school teacher. ACP - wishes full code status: CPR, mechanical ventilator and all heroic measures.      BP 140/68   Pulse (!) 126   Ht 5\' 4"  (1.626 m)   Wt 165 lb (74.8 kg)   SpO2 97%   BMI 28.32 kg/m   Physical Exam:  Well appearing NAD HEENT: Unremarkable Neck:  No JVD, no thyromegally Lymphatics:  No adenopathy Back:  No CVA tenderness Lungs:  Clear HEART:  IRegular rate rhythm, no murmurs, no rubs, no clicks Abd:  soft, positive bowel sounds, no organomegally, no rebound, no guarding Ext:  2 plus pulses, no edema, no cyanosis, no clubbing Skin:  No rashes no nodules Neuro:  CN II through XII intact, motor grossly intact  EKG - atrial fib with a RVR  DEVICE  Normal device function.  See PaceArt for details.   Assess/Plan: 1. Atrial fib - her rate in the office is up a bit but is better at home. She will continue her beta blocker. 2. HTN - her bp is fairly well controled. She is encouraged to maintain a low sodium diet.   [name] [name].D.

## 2019-11-07 ENCOUNTER — Ambulatory Visit: Payer: Medicare Other

## 2019-11-11 ENCOUNTER — Ambulatory Visit (INDEPENDENT_AMBULATORY_CARE_PROVIDER_SITE_OTHER): Payer: Medicare Other | Admitting: General Practice

## 2019-11-11 ENCOUNTER — Other Ambulatory Visit: Payer: Self-pay

## 2019-11-11 DIAGNOSIS — Z7901 Long term (current) use of anticoagulants: Secondary | ICD-10-CM | POA: Diagnosis not present

## 2019-11-11 LAB — POCT INR: INR: 2 (ref 2.0–3.0)

## 2019-11-11 NOTE — Patient Instructions (Incomplete)
Pre visit review using our clinic review tool, if applicable. No additional management support is needed unless otherwise documented below in the visit note. 

## 2019-11-11 NOTE — Progress Notes (Signed)
Medical treatment/procedure(s) were performed by non-physician practitioner and as supervising physician I was immediately available for consultation/collaboration. I agree with above. Jossalin Chervenak A Dejae Bernet, MD  

## 2019-11-14 ENCOUNTER — Ambulatory Visit: Payer: Medicare Other

## 2019-12-01 ENCOUNTER — Other Ambulatory Visit: Payer: Self-pay | Admitting: Internal Medicine

## 2019-12-01 NOTE — Telephone Encounter (Signed)
Filled 08/15/2019 Alprazolam 0.25 Mg Tablet 30#  Last ov 08/15/19  Next ov 12/26/19

## 2019-12-02 MED ORDER — ALPRAZOLAM 0.25 MG PO TABS: 0.2500 mg | ORAL_TABLET | Freq: Every day | ORAL | 2 refills | Status: DC | PRN

## 2019-12-02 NOTE — Addendum Note (Signed)
Addended by: Pricilla Holm A on: 12/02/2019 12:09 PM   Modules accepted: Orders

## 2019-12-02 NOTE — Telephone Encounter (Signed)
Sent in xanax. Please find and shred the paper script which was printed by [facility]

## 2019-12-18 DIAGNOSIS — H26493 Other secondary cataract, bilateral: Secondary | ICD-10-CM | POA: Diagnosis not present

## 2019-12-18 DIAGNOSIS — H35372 Puckering of macula, left eye: Secondary | ICD-10-CM | POA: Diagnosis not present

## 2019-12-22 ENCOUNTER — Ambulatory Visit: Payer: Medicare Other | Attending: Internal Medicine

## 2019-12-22 DIAGNOSIS — Z23 Encounter for immunization: Secondary | ICD-10-CM | POA: Insufficient documentation

## 2019-12-22 NOTE — Progress Notes (Signed)
   Covid-19 Vaccination Clinic  Name:  [name] [name]    MRN: [id number] DOB: Aug 26, 1931  12/22/2019  [name] was observed post Covid-19 immunization for 30 minutes based on pre-vaccination screening without incidence. She was provided with Vaccine Information Sheet and instruction to access the V-Safe system.   [name] was instructed to call 911 with any severe reactions post vaccine: Marland Kitchen Difficulty breathing  . Swelling of your face and throat  . A fast heartbeat  . A bad rash all over your body  . Dizziness and weakness    Immunizations Administered    Name Date Dose VIS Date Route   Pfizer COVID-19 Vaccine 12/22/2019 11:43 AM 0.3 mL 11/07/2019 Intramuscular   Manufacturer: [place]   Lot: [id number]   [place]: [id number]

## 2019-12-26 ENCOUNTER — Other Ambulatory Visit: Payer: Self-pay

## 2019-12-26 ENCOUNTER — Ambulatory Visit (INDEPENDENT_AMBULATORY_CARE_PROVIDER_SITE_OTHER): Payer: Medicare Other | Admitting: General Practice

## 2019-12-26 DIAGNOSIS — Z7901 Long term (current) use of anticoagulants: Secondary | ICD-10-CM

## 2019-12-26 LAB — POCT INR: INR: 1.9 — AB (ref 2.0–3.0)

## 2019-12-26 NOTE — Patient Instructions (Addendum)
Pre visit review using our clinic review tool, if applicable. No additional management support is needed unless otherwise documented below in the visit note.  Take 3 tablets today and then continue to take 1 tablet all days except 2 tablets on Mondays/ and Fridays.  Re-check in 4 weeks.

## 2019-12-26 NOTE — Progress Notes (Signed)
Medical treatment/procedure(s) were performed by non-physician practitioner and as supervising physician I was immediately available for consultation/collaboration. I agree with above. Elizabeth A Crawford, MD  

## 2019-12-29 DIAGNOSIS — Z1231 Encounter for screening mammogram for malignant neoplasm of breast: Secondary | ICD-10-CM | POA: Diagnosis not present

## 2020-01-12 ENCOUNTER — Ambulatory Visit: Payer: Medicare Other | Attending: Internal Medicine

## 2020-01-12 DIAGNOSIS — Z23 Encounter for immunization: Secondary | ICD-10-CM | POA: Insufficient documentation

## 2020-01-12 NOTE — Progress Notes (Signed)
   Covid-19 Vaccination Clinic  Name:  [name] [name]    MRN: [id number] DOB: March 09, 1931  01/12/2020  [name] was observed post Covid-19 immunization for 15 minutes without incidence. She was provided with Vaccine Information Sheet and instruction to access the V-Safe system.   [name] was instructed to call 911 with any severe reactions post vaccine: Marland Kitchen Difficulty breathing  . Swelling of your face and throat  . A fast heartbeat  . A bad rash all over your body  . Dizziness and weakness    Immunizations Administered    Name Date Dose VIS Date Route   Pfizer COVID-19 Vaccine 01/12/2020 11:04 AM 0.3 mL 11/07/2019 Intramuscular   Manufacturer: [place]   Lot: [id number]   [place]: [id number]

## 2020-01-23 ENCOUNTER — Ambulatory Visit (INDEPENDENT_AMBULATORY_CARE_PROVIDER_SITE_OTHER): Payer: Medicare Other | Admitting: General Practice

## 2020-01-23 ENCOUNTER — Other Ambulatory Visit: Payer: Self-pay

## 2020-01-23 DIAGNOSIS — I4891 Unspecified atrial fibrillation: Secondary | ICD-10-CM

## 2020-01-23 LAB — POCT INR: INR: 2.5 (ref 2.0–3.0)

## 2020-01-23 NOTE — Patient Instructions (Addendum)
Pre visit review using our clinic review tool, if applicable. No additional management support is needed unless otherwise documented below in the visit note.  Continue to take 1 tablet all days except 2 tablets on Mondays/ and Fridays.  Re-check in 4 weeks.

## 2020-01-23 NOTE — Progress Notes (Signed)
Medical screening examination/treatment/procedure(s) were performed by non-physician practitioner and as supervising physician I was immediately available for consultation/collaboration. I agree with above. Rees Matura, MD   

## 2020-02-11 ENCOUNTER — Other Ambulatory Visit: Payer: Self-pay | Admitting: Internal Medicine

## 2020-02-17 ENCOUNTER — Other Ambulatory Visit: Payer: Self-pay

## 2020-02-17 ENCOUNTER — Ambulatory Visit (INDEPENDENT_AMBULATORY_CARE_PROVIDER_SITE_OTHER): Payer: Medicare Other | Admitting: General Practice

## 2020-02-17 DIAGNOSIS — I4891 Unspecified atrial fibrillation: Secondary | ICD-10-CM

## 2020-02-17 LAB — POCT INR: INR: 2.8 (ref 2.0–3.0)

## 2020-02-17 NOTE — Patient Instructions (Addendum)
Pre visit review using our clinic review tool, if applicable. No additional management support is needed unless otherwise documented below in the visit note.  Continue to take 1 tablet all days except 2 tablets on Mondays/ and Fridays.  Re-check in 6 week.

## 2020-02-17 NOTE — Progress Notes (Signed)
Medical screening examination/treatment/procedure(s) were performed by non-physician practitioner and as supervising physician I was immediately available for consultation/collaboration. I agree with above. Auther Lyerly, MD   

## 2020-02-18 ENCOUNTER — Other Ambulatory Visit: Payer: Self-pay | Admitting: Internal Medicine

## 2020-03-30 ENCOUNTER — Other Ambulatory Visit: Payer: Self-pay

## 2020-03-30 ENCOUNTER — Ambulatory Visit (INDEPENDENT_AMBULATORY_CARE_PROVIDER_SITE_OTHER): Payer: Medicare Other | Admitting: General Practice

## 2020-03-30 DIAGNOSIS — Z7901 Long term (current) use of anticoagulants: Secondary | ICD-10-CM | POA: Diagnosis not present

## 2020-03-30 LAB — POCT INR: INR: 3.6 — AB (ref 2.0–3.0)

## 2020-03-30 NOTE — Patient Instructions (Addendum)
Pre visit review using our clinic review tool, if applicable. No additional management support is needed unless otherwise documented below in the visit note.  Skip dosage tomorrow (5/5) and then change dosage and take 1 tablet all days except 2 tablets on Mondays only.  Re-check in 4 weeks.

## 2020-03-30 NOTE — Progress Notes (Signed)
Medical screening examination/treatment/procedure(s) were performed by non-physician practitioner and as supervising physician I was immediately available for consultation/collaboration. I agree with above. Jayce Kainz, MD   

## 2020-04-02 ENCOUNTER — Ambulatory Visit (INDEPENDENT_AMBULATORY_CARE_PROVIDER_SITE_OTHER): Payer: Medicare Other | Admitting: Internal Medicine

## 2020-04-02 ENCOUNTER — Other Ambulatory Visit: Payer: Self-pay

## 2020-04-02 ENCOUNTER — Encounter: Payer: Self-pay | Admitting: Internal Medicine

## 2020-04-02 VITALS — BP 148/86 | HR 76 | Temp 98.5°F | Ht 64.0 in | Wt 167.0 lb

## 2020-04-02 DIAGNOSIS — I5032 Chronic diastolic (congestive) heart failure: Secondary | ICD-10-CM

## 2020-04-02 MED ORDER — FUROSEMIDE 40 MG PO TABS: 40.0000 mg | ORAL_TABLET | Freq: Every day | ORAL | 3 refills | Status: DC

## 2020-04-02 NOTE — Progress Notes (Signed)
   Subjective:   Patient ID: [name] [name] [facility], female    DOB: 1931/03/28, 86 y.o.   MRN: [id number]  HPI The patient is an 86 YO female coming in for concerns about blisters on her legs. Started about 2 months ago with more swelling in her legs. There is some dark color on the right leg. She has noticed swelling right leg more than left. She is also having some SOB with exertion. She has gained about 4-5 pounds. Overall not improving. They are hurting a little. Denies fevers or chills.   Review of Systems  Constitutional: Negative.   HENT: Negative.   Eyes: Negative.   Respiratory: Positive for shortness of breath. Negative for cough and chest tightness.   Cardiovascular: Positive for leg swelling. Negative for chest pain and palpitations.  Gastrointestinal: Negative for abdominal distention, abdominal pain, constipation, diarrhea, nausea and vomiting.  Musculoskeletal: Negative.   Skin: Negative.   Neurological: Negative.   Psychiatric/Behavioral: Negative.     Objective:  Physical Exam Constitutional:      Appearance: She is well-developed.  HENT:     Head: Normocephalic and atraumatic.  Cardiovascular:     Rate and Rhythm: Normal rate and regular rhythm.  Pulmonary:     Effort: Pulmonary effort is normal. No respiratory distress.     Breath sounds: Normal breath sounds. No wheezing or rales.  Abdominal:     General: Bowel sounds are normal. There is no distension.     Palpations: Abdomen is soft.     Tenderness: There is no abdominal tenderness. There is no rebound.  Musculoskeletal:     Cervical back: Normal range of motion.     Right lower leg: Edema present.     Left lower leg: Edema present.     Comments: 2_ edema to the knees bilateral, right leg with more firmness than left, no evidence for cellulitis but there is some skin darkening  Skin:    General: Skin is warm and dry.  Neurological:     Mental Status: She is alert and oriented to person, place, and time.   Coordination: Coordination normal.     Vitals:   04/02/20 1443  BP: (!) 148/86  Pulse: 76  Temp: 98.5 F (36.9 C)  SpO2: 99%  Weight: 167 lb (75.8 kg)  Height: 5\' 4"  (1.626 m)    This visit occurred during the SARS-CoV-2 public health emergency.  Safety protocols were in place, including screening questions prior to the visit, additional usage of staff PPE, and extensive cleaning of exam room while observing appropriate contact time as indicated for disinfecting solutions.   Assessment & Plan:

## 2020-04-02 NOTE — Patient Instructions (Addendum)
We have sent in lasix (furosmide) to take 1 pill daily in addition to the current medications for the next 1-2 weeks.

## 2020-04-02 NOTE — Assessment & Plan Note (Signed)
Add lasix 40 mg daily for 2 weeks then prn for swelling or weight gain.

## 2020-04-03 ENCOUNTER — Encounter: Payer: Self-pay | Admitting: Internal Medicine

## 2020-04-09 DIAGNOSIS — H353111 Nonexudative age-related macular degeneration, right eye, early dry stage: Secondary | ICD-10-CM | POA: Diagnosis not present

## 2020-04-09 DIAGNOSIS — H353221 Exudative age-related macular degeneration, left eye, with active choroidal neovascularization: Secondary | ICD-10-CM | POA: Diagnosis not present

## 2020-04-27 ENCOUNTER — Other Ambulatory Visit: Payer: Self-pay

## 2020-04-27 ENCOUNTER — Ambulatory Visit (INDEPENDENT_AMBULATORY_CARE_PROVIDER_SITE_OTHER): Payer: Medicare Other | Admitting: General Practice

## 2020-04-27 DIAGNOSIS — I4891 Unspecified atrial fibrillation: Secondary | ICD-10-CM | POA: Diagnosis not present

## 2020-04-27 DIAGNOSIS — Z7901 Long term (current) use of anticoagulants: Secondary | ICD-10-CM

## 2020-04-27 LAB — POCT INR: INR: 2.1 (ref 2.0–3.0)

## 2020-04-27 NOTE — Patient Instructions (Addendum)
Pre visit review using our clinic review tool, if applicable. No additional management support is needed unless otherwise documented below in the visit note.  Continue to take 1 tablet all days except 2 tablets on Mondays only.  Re-check in 4 weeks.  

## 2020-04-27 NOTE — Progress Notes (Signed)
Medical treatment/procedure(s) were performed by non-physician practitioner and as supervising physician I was immediately available for consultation/collaboration. I agree with above. Bravery Ketcham A Sofia Vanmeter, MD  

## 2020-05-04 ENCOUNTER — Other Ambulatory Visit: Payer: Self-pay | Admitting: Internal Medicine

## 2020-05-07 DIAGNOSIS — H353221 Exudative age-related macular degeneration, left eye, with active choroidal neovascularization: Secondary | ICD-10-CM | POA: Diagnosis not present

## 2020-05-25 ENCOUNTER — Ambulatory Visit (INDEPENDENT_AMBULATORY_CARE_PROVIDER_SITE_OTHER): Payer: Medicare Other | Admitting: General Practice

## 2020-05-25 ENCOUNTER — Other Ambulatory Visit: Payer: Self-pay

## 2020-05-25 DIAGNOSIS — I4891 Unspecified atrial fibrillation: Secondary | ICD-10-CM

## 2020-05-25 LAB — POCT INR: INR: 3 (ref 2.0–3.0)

## 2020-05-25 NOTE — Patient Instructions (Addendum)
Pre visit review using our clinic review tool, if applicable. No additional management support is needed unless otherwise documented below in the visit note.  Continue to take 1 tablet all days except 2 tablets on Mondays only.  Re-check in 4 weeks.

## 2020-05-25 NOTE — Progress Notes (Signed)
Medical treatment/procedure(s) were performed by non-physician practitioner and as supervising physician I was immediately available for consultation/collaboration. I agree with above. Taren Toops A Youa Deloney, MD  

## 2020-06-22 ENCOUNTER — Ambulatory Visit (INDEPENDENT_AMBULATORY_CARE_PROVIDER_SITE_OTHER): Payer: Medicare Other | Admitting: General Practice

## 2020-06-22 ENCOUNTER — Other Ambulatory Visit: Payer: Self-pay

## 2020-06-22 DIAGNOSIS — Z7901 Long term (current) use of anticoagulants: Secondary | ICD-10-CM | POA: Diagnosis not present

## 2020-06-22 LAB — POCT INR: INR: 2.8 (ref 2.0–3.0)

## 2020-06-22 NOTE — Progress Notes (Signed)
Medical treatment/procedure(s) were performed by non-physician practitioner and as supervising physician I was immediately available for consultation/collaboration. I agree with above. Tan Clopper A Rony Ratz, MD  

## 2020-06-22 NOTE — Patient Instructions (Addendum)
Pre visit review using our clinic review tool, if applicable. No additional management support is needed unless otherwise documented below in the visit note.  Continue to take 1 tablet all days except 2 tablets on Mondays only.  Re-check in 6 weeks.

## 2020-06-25 ENCOUNTER — Other Ambulatory Visit: Payer: Self-pay | Admitting: Internal Medicine

## 2020-07-16 DIAGNOSIS — H353111 Nonexudative age-related macular degeneration, right eye, early dry stage: Secondary | ICD-10-CM | POA: Diagnosis not present

## 2020-07-16 DIAGNOSIS — Z01818 Encounter for other preprocedural examination: Secondary | ICD-10-CM | POA: Diagnosis not present

## 2020-07-16 DIAGNOSIS — H4312 Vitreous hemorrhage, left eye: Secondary | ICD-10-CM | POA: Diagnosis not present

## 2020-07-16 DIAGNOSIS — H353221 Exudative age-related macular degeneration, left eye, with active choroidal neovascularization: Secondary | ICD-10-CM | POA: Diagnosis not present

## 2020-07-22 ENCOUNTER — Telehealth: Payer: Self-pay | Admitting: Internal Medicine

## 2020-07-22 NOTE — Telephone Encounter (Signed)
Form has been faxed again

## 2020-07-22 NOTE — Telephone Encounter (Signed)
Rinaldo Cloud, NP from [facility] called and said that the fax that was sent this morning only showed the top half of the page and she was wondering if it could be refaxed. It was in regards to holding her Warfarin for retinal surgery.

## 2020-07-25 ENCOUNTER — Emergency Department (HOSPITAL_BASED_OUTPATIENT_CLINIC_OR_DEPARTMENT_OTHER): Payer: Medicare Other

## 2020-07-25 ENCOUNTER — Emergency Department (HOSPITAL_COMMUNITY): Payer: Medicare Other

## 2020-07-25 ENCOUNTER — Other Ambulatory Visit: Payer: Self-pay

## 2020-07-25 ENCOUNTER — Inpatient Hospital Stay (HOSPITAL_COMMUNITY)
Admission: EM | Admit: 2020-07-25 | Discharge: 2020-08-12 | DRG: 871 | Disposition: A | Discharge status: Rehabilitation Facility | Payer: Medicare Other | Attending: Internal Medicine | Admitting: Internal Medicine

## 2020-07-25 ENCOUNTER — Encounter (HOSPITAL_COMMUNITY): Payer: Self-pay

## 2020-07-25 ENCOUNTER — Ambulatory Visit (HOSPITAL_COMMUNITY)
Admission: EM | Admit: 2020-07-25 | Discharge: 2020-07-25 | Disposition: A | Discharge status: Home / Self Care | Payer: Medicare Other

## 2020-07-25 ENCOUNTER — Encounter (HOSPITAL_COMMUNITY): Payer: Self-pay | Admitting: Emergency Medicine

## 2020-07-25 DIAGNOSIS — N179 Acute kidney failure, unspecified: Secondary | ICD-10-CM | POA: Diagnosis not present

## 2020-07-25 DIAGNOSIS — I361 Nonrheumatic tricuspid (valve) insufficiency: Secondary | ICD-10-CM | POA: Diagnosis not present

## 2020-07-25 DIAGNOSIS — R935 Abnormal findings on diagnostic imaging of other abdominal regions, including retroperitoneum: Secondary | ICD-10-CM

## 2020-07-25 DIAGNOSIS — E785 Hyperlipidemia, unspecified: Secondary | ICD-10-CM | POA: Diagnosis present

## 2020-07-25 DIAGNOSIS — A419 Sepsis, unspecified organism: Secondary | ICD-10-CM

## 2020-07-25 DIAGNOSIS — E861 Hypovolemia: Secondary | ICD-10-CM | POA: Diagnosis present

## 2020-07-25 DIAGNOSIS — M7989 Other specified soft tissue disorders: Secondary | ICD-10-CM

## 2020-07-25 DIAGNOSIS — J9601 Acute respiratory failure with hypoxia: Secondary | ICD-10-CM | POA: Diagnosis not present

## 2020-07-25 DIAGNOSIS — I4821 Permanent atrial fibrillation: Secondary | ICD-10-CM | POA: Diagnosis not present

## 2020-07-25 DIAGNOSIS — K5900 Constipation, unspecified: Secondary | ICD-10-CM | POA: Diagnosis present

## 2020-07-25 DIAGNOSIS — M19072 Primary osteoarthritis, left ankle and foot: Secondary | ICD-10-CM | POA: Diagnosis not present

## 2020-07-25 DIAGNOSIS — B370 Candidal stomatitis: Secondary | ICD-10-CM | POA: Diagnosis not present

## 2020-07-25 DIAGNOSIS — I10 Essential (primary) hypertension: Secondary | ICD-10-CM | POA: Diagnosis not present

## 2020-07-25 DIAGNOSIS — I5032 Chronic diastolic (congestive) heart failure: Secondary | ICD-10-CM | POA: Diagnosis not present

## 2020-07-25 DIAGNOSIS — Z90710 Acquired absence of both cervix and uterus: Secondary | ICD-10-CM | POA: Diagnosis not present

## 2020-07-25 DIAGNOSIS — L03119 Cellulitis of unspecified part of limb: Secondary | ICD-10-CM

## 2020-07-25 DIAGNOSIS — R6521 Severe sepsis with septic shock: Secondary | ICD-10-CM | POA: Diagnosis present

## 2020-07-25 DIAGNOSIS — R059 Cough, unspecified: Secondary | ICD-10-CM

## 2020-07-25 DIAGNOSIS — L89892 Pressure ulcer of other site, stage 2: Secondary | ICD-10-CM | POA: Diagnosis present

## 2020-07-25 DIAGNOSIS — E782 Mixed hyperlipidemia: Secondary | ICD-10-CM

## 2020-07-25 DIAGNOSIS — I499 Cardiac arrhythmia, unspecified: Secondary | ICD-10-CM | POA: Diagnosis not present

## 2020-07-25 DIAGNOSIS — Z7901 Long term (current) use of anticoagulants: Secondary | ICD-10-CM | POA: Diagnosis not present

## 2020-07-25 DIAGNOSIS — L03116 Cellulitis of left lower limb: Secondary | ICD-10-CM | POA: Diagnosis not present

## 2020-07-25 DIAGNOSIS — Z20822 Contact with and (suspected) exposure to covid-19: Secondary | ICD-10-CM | POA: Diagnosis not present

## 2020-07-25 DIAGNOSIS — J90 Pleural effusion, not elsewhere classified: Secondary | ICD-10-CM | POA: Diagnosis not present

## 2020-07-25 DIAGNOSIS — I708 Atherosclerosis of other arteries: Secondary | ICD-10-CM | POA: Diagnosis not present

## 2020-07-25 DIAGNOSIS — L89322 Pressure ulcer of left buttock, stage 2: Secondary | ICD-10-CM | POA: Diagnosis present

## 2020-07-25 DIAGNOSIS — R05 Cough: Secondary | ICD-10-CM | POA: Diagnosis not present

## 2020-07-25 DIAGNOSIS — A409 Streptococcal sepsis, unspecified: Principal | ICD-10-CM | POA: Diagnosis present

## 2020-07-25 DIAGNOSIS — Z9049 Acquired absence of other specified parts of digestive tract: Secondary | ICD-10-CM

## 2020-07-25 DIAGNOSIS — R7401 Elevation of levels of liver transaminase levels: Secondary | ICD-10-CM

## 2020-07-25 DIAGNOSIS — E1165 Type 2 diabetes mellitus with hyperglycemia: Secondary | ICD-10-CM | POA: Diagnosis not present

## 2020-07-25 DIAGNOSIS — E876 Hypokalemia: Secondary | ICD-10-CM | POA: Diagnosis present

## 2020-07-25 DIAGNOSIS — I110 Hypertensive heart disease with heart failure: Secondary | ICD-10-CM | POA: Diagnosis present

## 2020-07-25 DIAGNOSIS — R5381 Other malaise: Secondary | ICD-10-CM | POA: Diagnosis present

## 2020-07-25 DIAGNOSIS — Z743 Need for continuous supervision: Secondary | ICD-10-CM | POA: Diagnosis not present

## 2020-07-25 DIAGNOSIS — M79609 Pain in unspecified limb: Secondary | ICD-10-CM

## 2020-07-25 DIAGNOSIS — I9589 Other hypotension: Secondary | ICD-10-CM

## 2020-07-25 DIAGNOSIS — E8809 Other disorders of plasma-protein metabolism, not elsewhere classified: Secondary | ICD-10-CM | POA: Diagnosis not present

## 2020-07-25 DIAGNOSIS — J984 Other disorders of lung: Secondary | ICD-10-CM | POA: Diagnosis not present

## 2020-07-25 DIAGNOSIS — Z85820 Personal history of malignant melanoma of skin: Secondary | ICD-10-CM | POA: Diagnosis not present

## 2020-07-25 DIAGNOSIS — L8990 Pressure ulcer of unspecified site, unspecified stage: Secondary | ICD-10-CM | POA: Insufficient documentation

## 2020-07-25 DIAGNOSIS — R911 Solitary pulmonary nodule: Secondary | ICD-10-CM | POA: Diagnosis not present

## 2020-07-25 DIAGNOSIS — J189 Pneumonia, unspecified organism: Secondary | ICD-10-CM | POA: Diagnosis not present

## 2020-07-25 DIAGNOSIS — M79605 Pain in left leg: Secondary | ICD-10-CM | POA: Diagnosis not present

## 2020-07-25 DIAGNOSIS — K7689 Other specified diseases of liver: Secondary | ICD-10-CM | POA: Diagnosis not present

## 2020-07-25 DIAGNOSIS — I700 Atherosclerosis of aorta: Secondary | ICD-10-CM | POA: Diagnosis not present

## 2020-07-25 DIAGNOSIS — R600 Localized edema: Secondary | ICD-10-CM | POA: Diagnosis not present

## 2020-07-25 DIAGNOSIS — E872 Acidosis: Secondary | ICD-10-CM | POA: Diagnosis present

## 2020-07-25 DIAGNOSIS — I4891 Unspecified atrial fibrillation: Secondary | ICD-10-CM

## 2020-07-25 DIAGNOSIS — R079 Chest pain, unspecified: Secondary | ICD-10-CM | POA: Diagnosis not present

## 2020-07-25 DIAGNOSIS — K7460 Unspecified cirrhosis of liver: Secondary | ICD-10-CM | POA: Diagnosis present

## 2020-07-25 DIAGNOSIS — Z79899 Other long term (current) drug therapy: Secondary | ICD-10-CM | POA: Diagnosis not present

## 2020-07-25 DIAGNOSIS — I5033 Acute on chronic diastolic (congestive) heart failure: Secondary | ICD-10-CM | POA: Diagnosis present

## 2020-07-25 DIAGNOSIS — F419 Anxiety disorder, unspecified: Secondary | ICD-10-CM | POA: Diagnosis present

## 2020-07-25 DIAGNOSIS — I2510 Atherosclerotic heart disease of native coronary artery without angina pectoris: Secondary | ICD-10-CM | POA: Diagnosis not present

## 2020-07-25 DIAGNOSIS — K6289 Other specified diseases of anus and rectum: Secondary | ICD-10-CM | POA: Diagnosis not present

## 2020-07-25 DIAGNOSIS — R6520 Severe sepsis without septic shock: Secondary | ICD-10-CM | POA: Diagnosis not present

## 2020-07-25 DIAGNOSIS — R52 Pain, unspecified: Secondary | ICD-10-CM

## 2020-07-25 DIAGNOSIS — I517 Cardiomegaly: Secondary | ICD-10-CM | POA: Diagnosis not present

## 2020-07-25 DIAGNOSIS — K759 Inflammatory liver disease, unspecified: Secondary | ICD-10-CM | POA: Diagnosis present

## 2020-07-25 DIAGNOSIS — T45515A Adverse effect of anticoagulants, initial encounter: Secondary | ICD-10-CM | POA: Diagnosis present

## 2020-07-25 DIAGNOSIS — R7989 Other specified abnormal findings of blood chemistry: Secondary | ICD-10-CM

## 2020-07-25 DIAGNOSIS — R791 Abnormal coagulation profile: Secondary | ICD-10-CM | POA: Diagnosis present

## 2020-07-25 DIAGNOSIS — I371 Nonrheumatic pulmonary valve insufficiency: Secondary | ICD-10-CM | POA: Diagnosis not present

## 2020-07-25 DIAGNOSIS — Z7401 Bed confinement status: Secondary | ICD-10-CM | POA: Diagnosis not present

## 2020-07-25 DIAGNOSIS — R0902 Hypoxemia: Secondary | ICD-10-CM | POA: Diagnosis not present

## 2020-07-25 DIAGNOSIS — I959 Hypotension, unspecified: Secondary | ICD-10-CM

## 2020-07-25 DIAGNOSIS — R7402 Elevation of levels of lactic acid dehydrogenase (LDH): Secondary | ICD-10-CM | POA: Diagnosis not present

## 2020-07-25 DIAGNOSIS — I340 Nonrheumatic mitral (valve) insufficiency: Secondary | ICD-10-CM | POA: Diagnosis not present

## 2020-07-25 DIAGNOSIS — M2550 Pain in unspecified joint: Secondary | ICD-10-CM | POA: Diagnosis not present

## 2020-07-25 DIAGNOSIS — R0602 Shortness of breath: Secondary | ICD-10-CM | POA: Diagnosis not present

## 2020-07-25 DIAGNOSIS — R0600 Dyspnea, unspecified: Secondary | ICD-10-CM

## 2020-07-25 LAB — CBC WITH DIFFERENTIAL/PLATELET
Abs Immature Granulocytes: 0.3 10*3/uL — ABNORMAL HIGH (ref 0.00–0.07)
Band Neutrophils: 48 %
Basophils Absolute: 0 10*3/uL (ref 0.0–0.1)
Basophils Relative: 0 %
Eosinophils Absolute: 0 10*3/uL (ref 0.0–0.5)
Eosinophils Relative: 0 %
HCT: 48.8 % — ABNORMAL HIGH (ref 36.0–46.0)
Hemoglobin: 16.4 g/dL — ABNORMAL HIGH (ref 12.0–15.0)
Lymphocytes Relative: 16 %
Lymphs Abs: 0.8 10*3/uL (ref 0.7–4.0)
MCH: 30.7 pg (ref 26.0–34.0)
MCHC: 33.6 g/dL (ref 30.0–36.0)
MCV: 91.2 fL (ref 80.0–100.0)
Metamyelocytes Relative: 5 %
Monocytes Absolute: 0.3 10*3/uL (ref 0.1–1.0)
Monocytes Relative: 6 %
Myelocytes: 1 %
Neutro Abs: 3.8 10*3/uL (ref 1.7–7.7)
Neutrophils Relative %: 24 %
Platelets: 180 10*3/uL (ref 150–400)
RBC: 5.35 MIL/uL — ABNORMAL HIGH (ref 3.87–5.11)
RDW: 13.2 % (ref 11.5–15.5)
WBC Morphology: INCREASED
WBC: 5.3 10*3/uL (ref 4.0–10.5)
nRBC: 0 % (ref 0.0–0.2)

## 2020-07-25 LAB — PROTIME-INR
INR: 2.9 — ABNORMAL HIGH (ref 0.8–1.2)
Prothrombin Time: 29.6 seconds — ABNORMAL HIGH (ref 11.4–15.2)

## 2020-07-25 LAB — COMPREHENSIVE METABOLIC PANEL
ALT: 31 U/L (ref 0–44)
AST: 51 U/L — ABNORMAL HIGH (ref 15–41)
Albumin: 3.4 g/dL — ABNORMAL LOW (ref 3.5–5.0)
Alkaline Phosphatase: 88 U/L (ref 38–126)
Anion gap: 15 (ref 5–15)
BUN: 19 mg/dL (ref 8–23)
CO2: 20 mmol/L — ABNORMAL LOW (ref 22–32)
Calcium: 9.5 mg/dL (ref 8.9–10.3)
Chloride: 99 mmol/L (ref 98–111)
Creatinine, Ser: 1.1 mg/dL — ABNORMAL HIGH (ref 0.44–1.00)
GFR calc Af Amer: 53 mL/min — ABNORMAL LOW (ref >60)
GFR calc non Af Amer: 45 mL/min — ABNORMAL LOW (ref >60)
Glucose, Bld: 183 mg/dL — ABNORMAL HIGH (ref 70–99)
Potassium: 3.6 mmol/L (ref 3.5–5.1)
Sodium: 134 mmol/L — ABNORMAL LOW (ref 135–145)
Total Bilirubin: UNDETERMINED mg/dL (ref 0.3–1.2)
Total Protein: 7 g/dL (ref 6.5–8.1)

## 2020-07-25 LAB — SARS CORONAVIRUS 2 BY RT PCR (HOSPITAL ORDER, PERFORMED IN ~~LOC~~ HOSPITAL LAB): SARS Coronavirus 2: NEGATIVE

## 2020-07-25 LAB — URINALYSIS, ROUTINE W REFLEX MICROSCOPIC
Bilirubin Urine: NEGATIVE
Glucose, UA: NEGATIVE mg/dL
Hgb urine dipstick: NEGATIVE
Ketones, ur: NEGATIVE mg/dL
Leukocytes,Ua: NEGATIVE
Nitrite: NEGATIVE
Protein, ur: NEGATIVE mg/dL
Specific Gravity, Urine: 1.018 (ref 1.005–1.030)
pH: 6 (ref 5.0–8.0)

## 2020-07-25 LAB — APTT: aPTT: 45 seconds — ABNORMAL HIGH (ref 24–36)

## 2020-07-25 LAB — LACTIC ACID, PLASMA
Lactic Acid, Venous: 6.7 mmol/L (ref 0.5–1.9)
Lactic Acid, Venous: 5.8 mmol/L (ref 0.5–1.9)
Lactic Acid, Venous: 4.4 mmol/L (ref 0.5–1.9)

## 2020-07-25 MED ORDER — VANCOMYCIN HCL IN DEXTROSE 1-5 GM/200ML-% IV SOLN: 1000.0000 mg | Freq: Once | INTRAVENOUS | Status: DC

## 2020-07-25 MED ORDER — SODIUM CHLORIDE 0.9 % IV SOLN
2.0000 g | Freq: Two times a day (BID) | INTRAVENOUS | Status: DC
  Administered 2020-07-26: 2 g via INTRAVENOUS
  Filled 2020-07-25: qty 2

## 2020-07-25 MED ORDER — IOHEXOL 350 MG/ML SOLN
80.0000 mL | Freq: Once | INTRAVENOUS | Status: AC | PRN
  Administered 2020-07-25: 80 mL via INTRAVENOUS

## 2020-07-25 MED ORDER — ATORVASTATIN CALCIUM 10 MG PO TABS
20.0000 mg | ORAL_TABLET | Freq: Every day | ORAL | Status: DC
  Administered 2020-07-26 – 2020-07-29 (×4): 20 mg via ORAL
  Filled 2020-07-25 (×4): qty 2

## 2020-07-25 MED ORDER — IOHEXOL 350 MG/ML SOLN: 80.0000 mL | Freq: Once | INTRAVENOUS | Status: DC | PRN

## 2020-07-25 MED ORDER — ACETAMINOPHEN 325 MG PO TABS
650.0000 mg | ORAL_TABLET | Freq: Four times a day (QID) | ORAL | Status: DC | PRN
  Administered 2020-07-26 – 2020-07-30 (×9): 650 mg via ORAL
  Filled 2020-07-25 (×9): qty 2

## 2020-07-25 MED ORDER — ALPRAZOLAM 0.25 MG PO TABS
0.2500 mg | ORAL_TABLET | Freq: Every day | ORAL | Status: DC | PRN
  Administered 2020-07-28 – 2020-08-11 (×12): 0.25 mg via ORAL
  Filled 2020-07-25 (×14): qty 1

## 2020-07-25 MED ORDER — VANCOMYCIN HCL 1500 MG/300ML IV SOLN
1500.0000 mg | Freq: Once | INTRAVENOUS | Status: AC
  Administered 2020-07-25: 1500 mg via INTRAVENOUS
  Filled 2020-07-25: qty 300

## 2020-07-25 MED ORDER — METRONIDAZOLE IN NACL 5-0.79 MG/ML-% IV SOLN
500.0000 mg | Freq: Once | INTRAVENOUS | Status: AC
  Administered 2020-07-25: 500 mg via INTRAVENOUS
  Filled 2020-07-25: qty 100

## 2020-07-25 MED ORDER — METOPROLOL TARTRATE 5 MG/5ML IV SOLN
2.5000 mg | Freq: Once | INTRAVENOUS | Status: AC
  Administered 2020-07-25: 2.5 mg via INTRAVENOUS
  Filled 2020-07-25: qty 5

## 2020-07-25 MED ORDER — LACTATED RINGERS IV BOLUS (SEPSIS)
1000.0000 mL | Freq: Once | INTRAVENOUS | Status: AC
  Administered 2020-07-25: 1000 mL via INTRAVENOUS

## 2020-07-25 MED ORDER — LACTATED RINGERS IV BOLUS (SEPSIS)
250.0000 mL | Freq: Once | INTRAVENOUS | Status: AC
  Administered 2020-07-25: 250 mL via INTRAVENOUS

## 2020-07-25 MED ORDER — ACETAMINOPHEN 325 MG PO TABS
650.0000 mg | ORAL_TABLET | Freq: Once | ORAL | Status: AC
  Administered 2020-07-25: 650 mg via ORAL
  Filled 2020-07-25: qty 2

## 2020-07-25 MED ORDER — VANCOMYCIN HCL IN DEXTROSE 1-5 GM/200ML-% IV SOLN: 1000.0000 mg | INTRAVENOUS | Status: DC

## 2020-07-25 MED ORDER — SODIUM CHLORIDE 0.9 % IV SOLN
2.0000 g | Freq: Once | INTRAVENOUS | Status: AC
  Administered 2020-07-25: 2 g via INTRAVENOUS
  Filled 2020-07-25: qty 2

## 2020-07-25 MED ORDER — WARFARIN - PHARMACIST DOSING INPATIENT: Freq: Every day | Status: DC

## 2020-07-25 MED ORDER — LACTATED RINGERS IV SOLN: INTRAVENOUS | Status: DC

## 2020-07-25 MED ORDER — VANCOMYCIN HCL 750 MG/150ML IV SOLN: 750.0000 mg | Freq: Two times a day (BID) | INTRAVENOUS | Status: DC

## 2020-07-25 NOTE — Discharge Instructions (Signed)
Pt to be seen in ED today for evaluation of right lower extremity to rule out DVT.

## 2020-07-25 NOTE — ED Provider Notes (Signed)
[facility]    CSN: [id number] Arrival date & time: 07/25/20  1009      History   Chief Complaint Chief Complaint  Patient presents with  . multiple complaints    HPI [name] [name] is a 86 y.o. female.   Pt presents for worsening left lower extremity redness, swelling, warmth and pain that has become worse over the last few days.  She was seen by PCP one month ago with complaints of lower extremity swelling, right greater than left at that time.  Advised to increase Lasix at this visit.  She reported shortness of breath at this time as well.  Today pt denies worsening shortness of breath, but does endorse baseline shortness of breath.  She also complains of diarrhea and nausea for the last few days.  Reports she has been eating less, but keeping fluids down.  Has started drinking gatorade.  Denies fever, chills, chest pain.  Pt with atrial fibrillation, followed by Dr. Lovena Le.  Anticoagulated with Coumadin and rate controlled on Metoprolol.  Reports she has taken medication as prescribed.       Past Medical History:  Diagnosis Date  . A-fib ([place])    paroxysmal  . Aftercare for healing traumatic fracture of arm, unspecified   . H/O: hemorrhoidectomy   . H/O: hysterectomy   . Hyperlipidemia   . Hypertension   . Malignant melanoma of skin of trunk, except scrotum ([place])   . Other postprocedural status(V45.89)   . Rosacea   . S/P cholecystectomy   . Sciatica    right  . Sprain of lumbar region     Patient Active Problem List   Diagnosis Date Noted  . Chronic diastolic heart failure ([place]) 10/27/2019  . Anxiety 05/02/2017  . Routine general medical examination at a health care facility 08/13/2015  . Tremor of hands and face 10/21/2013  . Skin lesion 10/07/2008  . SCIATICA, RIGHT 06/03/2008  . Hyperlipidemia 09/06/2007  . Essential hypertension 09/06/2007  . Atrial fibrillation ([place]) 09/06/2007    Past Surgical History:  Procedure Laterality Date  .  ABDOMINAL HYSTERECTOMY    . BLADDER SUSPENSION     never go surgery because it got canceled due to heart rate   . cataract Bilateral 2019  . catheter ablation of atrail flutter    . CHOLECYSTECTOMY    . HERNIA REPAIR      OB History   No obstetric history on file.      Home Medications    Prior to Admission medications   Medication Sig Start Date End Date Taking? Authorizing Provider  warfarin (COUMADIN) 2.5 MG tablet TAKE 1 TABLET DAILY EXCEPT 2 TABS ON MON AND FRI OR AS DIRECTED BY ANTICOAGULATION CLINIC 05/04/20   Hoyt Koch, MD  acetaminophen (TYLENOL) 500 MG tablet Take 1,000 mg by mouth every 6 (six) hours as needed. For pain    [provider]  ALPRAZolam (XANAX) 0.25 MG tablet Take 1 tablet (0.25 mg total) by mouth daily as needed. for anxiety 12/02/19   Hoyt Koch, MD  atorvastatin (LIPITOR) 20 MG tablet Take 1 tablet (20 mg total) by mouth daily at 6 PM. Annual appt due in Sept must see provider for future refills 02/18/20   Hoyt Koch, MD  Calcium Carbonate-Vitamin D (CALCIUM-VITAMIN D) 500-200 MG-UNIT per tablet Take 1 tablet by mouth every other day.     [provider]  ENSURE (ENSURE) Take 237 mLs by mouth as needed.  [provider]  furosemide (LASIX) 40 MG tablet TAKE 1 TABLET BY MOUTH EVERY DAY 06/25/20   Hoyt Koch, MD  hydrochlorothiazide (HYDRODIURIL) 25 MG tablet TAKE 1 TABLET BY MOUTH EVERY DAY 05/04/20   Hoyt Koch, MD  Hydrocortisone (GERHARDT'S BUTT CREAM) CREA Apply 1 application topically daily as needed for irritation.     [provider]  metoprolol succinate (TOPROL-XL) 100 MG 24 hr tablet TAKE 1 DAILY, WITH OR IMMEDIATELY FOLLOWING A MEAL. KEEP UPCOMING APPT WITH DR. Lovena Le IN NOVEMBER 12/01/19   Evans Lance, MD  Multiple Vitamins-Minerals (MULTIVITAMIN WITH MINERALS) tablet Take 1 tablet by mouth daily.      [provider]  Zinc Oxide (TRIPLE PASTE) 12.8 %  ointment Apply 1 application topically as needed for irritation.    [provider]    Family History Family History  Problem Relation Age of Onset  . Heart failure Mother   . Heart failure Father   . Liver cancer Daughter     Social History Social History   Tobacco Use  . Smoking status: Never Smoker  . Smokeless tobacco: Never Used  Substance Use Topics  . Alcohol use: No  . Drug use: No     Allergies   Antihistamines, diphenhydramine-type and Oxycodone-acetaminophen   Review of Systems Review of Systems  Constitutional: Negative for appetite change (decreased appetite), chills and fever.  HENT: Negative for ear pain and sore throat.   Eyes: Negative for pain and visual disturbance.  Respiratory: Negative for cough, chest tightness, shortness of breath and wheezing.   Cardiovascular: Positive for leg swelling (left lower extremity swelling). Negative for chest pain and palpitations.  Gastrointestinal: Positive for diarrhea and nausea. Negative for abdominal pain and vomiting.  Genitourinary: Negative for dysuria and hematuria.  Musculoskeletal: Positive for gait problem (left lower extremity pain with weight bearing). Negative for arthralgias and back pain.  Skin: Positive for color change (LLE redness). Negative for rash and wound.  Neurological: Negative for seizures, syncope and speech difficulty.  All other systems reviewed and are negative.    Physical Exam Triage Vital Signs ED Triage Vitals  Enc Vitals Group     BP 07/25/20 1041 (!) 142/95     Pulse Rate 07/25/20 1041 (!) 126     Resp 07/25/20 1041 16     Temp 07/25/20 1041 98.4 F (36.9 C)     Temp Source 07/25/20 1041 Oral     SpO2 07/25/20 1041 95 %     Weight 07/25/20 1042 160 lb (72.6 kg)     Height 07/25/20 1042 5\' 9"  (1.753 m)     Head Circumference --      Peak Flow --      Pain Score 07/25/20 1042 10     Pain Loc --      Pain Edu? --      Excl. in [place]? --    No data  found.  Updated Vital Signs BP (!) 142/95   Pulse (!) 126   Temp 98.4 F (36.9 C) (Oral)   Resp 16   Ht 5\' 9"  (1.753 m)   Wt 160 lb (72.6 kg)   SpO2 95%   BMI 23.63 kg/m   Visual Acuity Right Eye Distance:   Left Eye Distance:   Bilateral Distance:    Right Eye Near:   Left Eye Near:    Bilateral Near:     Physical Exam Vitals and nursing note reviewed.  Constitutional:  General: She is not in acute distress.    Appearance: She is well-developed. She is not diaphoretic.  HENT:     Head: Normocephalic and atraumatic.  Eyes:     Conjunctiva/sclera: Conjunctivae normal.  Cardiovascular:     Rate and Rhythm: Tachycardia present. Rhythm irregular.     Pulses: Normal pulses.     Heart sounds: No murmur heard.   Pulmonary:     Effort: Pulmonary effort is normal. No respiratory distress.     Breath sounds: Normal breath sounds. No decreased breath sounds.  Abdominal:     Palpations: Abdomen is soft.     Tenderness: There is no abdominal tenderness.  Musculoskeletal:     Cervical back: Neck supple.     Comments: Left lower extremity 2+ edema extending to the knee, redness and warmth noted.  Normal pulses.  Tenderness to palpation of left calf.    Skin:    General: Skin is warm and dry.  Neurological:     Mental Status: She is alert.  Psychiatric:        Mood and Affect: Mood normal.        Behavior: Behavior normal.      UC Treatments / Results  Labs (all labs ordered are listed, but only abnormal results are displayed) Labs Reviewed - No data to display  EKG   Radiology No results found.  Procedures Procedures (including critical care time)  Medications Ordered in UC Medications - No data to display  Initial Impression / Assessment and Plan / UC Course  I have reviewed the triage vital signs and the nursing notes.  Pertinent labs & imaging results that were available during my care of the patient were reviewed by me and considered in my medical  decision making (see chart for details).     Advised pt to be seen in ED to rule out DVT, A-fib with rate of 126, several days of nausea and diarrhea.  Pt's son is with her now and reports he will take her to [facility] ED right now.  LLE swelling, redness, warmth, and pain concerning for DVT, [place] needed to rule out DVT.  Pt with h/o atrial fibrillation, pulse 126.  Denies chest pain, acute shortness of breath, syncope, dizziness/lightheadedness.  Pt keeping fluids down without difficulty in clinic before discharge.   Final Clinical Impressions(s) / UC Diagnoses   Final diagnoses:  Pain and swelling of left lower extremity     Discharge Instructions     Pt to be seen in ED today for evaluation of right lower extremity to rule out DVT.    ED Prescriptions    None     PDMP not reviewed this encounter.   Konrad Felix, PA-C 07/25/20 1157

## 2020-07-25 NOTE — ED Notes (Signed)
[name] [name], [place] notified of Lactic Acid 6.7.  Pt prioritized to go to next treatment room.

## 2020-07-25 NOTE — ED Notes (Signed)
Pt called x3 for room with no response

## 2020-07-25 NOTE — ED Triage Notes (Signed)
C/o pain, redness, warmth, and swelling to bilateral legs x 3 weeks.  L worse than R.  Reports temp 99, nausea, and diarrhea.  2+ pitting edema to LLE.

## 2020-07-25 NOTE — Progress Notes (Signed)
Pharmacy Antibiotic Note  [name] [name] is a 86 y.o. female admitted on 07/25/2020 with sepsis.  Pharmacy has been consulted for Cefepime and Vancomycin dosing.      Temp (24hrs), Avg:99.1 F (37.3 C), Min:98.4 F (36.9 C), Max:99.4 F (37.4 C)  Recent Labs  Lab 07/25/20 1346  WBC 5.3  CREATININE 1.10*  LATICACIDVEN 6.7*    Estimated Creatinine Clearance: 38.4 mL/min (A) (by C-G formula based on SCr of 1.1 mg/dL (H)).    Allergies  Allergen Reactions  . Antihistamines, Diphenhydramine-Type     ALL ANTIHISTAMINES causes her heart to race  . Oxycodone-Acetaminophen Nausea Only    Antimicrobials this admission: 8/29 Cefepime >>  8/29 Vancomycin >>   Dose adjustments this admission: N/a  Microbiology results: Pending   Plan:  - Cefepime 2g IV q12h - Vancomycin 1500mg  IV x 1 dose  - Followed by Vancomycin 750mg  IV q12h (nomgoram dosing)  - Monitor patients renal function and urine output  - De-escalate ABX when appropriate   Thank you for allowing pharmacy to be a part of this patient's care.  Duanne Limerick PharmD. BCPS 07/25/2020 3:51 PM

## 2020-07-25 NOTE — Progress Notes (Signed)
ANTICOAGULATION CONSULT NOTE - Initial Consult  Pharmacy Consult for Warfarin  Indication: atrial fibrillation  Allergies  Allergen Reactions  . Antihistamines, Diphenhydramine-Type Other (See Comments)    ALL ANTIHISTAMINES causes her heart to race  . Oxycodone-Acetaminophen Nausea Only    Patient Measurements:   Heparin Dosing Weight:   Vital Signs: Temp: 100.6 F (38.1 C) (08/29 1807) Temp Source: Oral (08/29 1807) BP: 117/74 (08/29 1900) Pulse Rate: 129 (08/29 1900)  Labs: Recent Labs    07/25/20 1346 07/25/20 1656  HGB 16.4*  --   HCT 48.8*  --   PLT 180  --   APTT  --  45*  LABPROT  --  29.6*  INR  --  2.9*  CREATININE 1.10*  --     Estimated Creatinine Clearance: 38.4 mL/min (A) (by C-G formula based on SCr of 1.1 mg/dL (H)).   Medical History: Past Medical History:  Diagnosis Date  . A-fib ([place])    paroxysmal  . Aftercare for healing traumatic fracture of arm, unspecified   . H/O: hemorrhoidectomy   . H/O: hysterectomy   . Hyperlipidemia   . Hypertension   . Malignant melanoma of skin of trunk, except scrotum ([place])   . Other postprocedural status(V45.89)   . Rosacea   . S/P cholecystectomy   . Sciatica    right  . Sprain of lumbar region     Medications:  Scheduled:  . [START ON 07/26/2020] atorvastatin  20 mg Oral q1800    Assessment: Patient is a 77 yof that presents to the ED with N/V/D. The patient was found to be septic and started on broad spectrum antibiotics. The patient takes warfarin PTA for afib. Pharmacy has been asked to continue dosing warfarin while inpatient.   Warfarin PTA dose: 5mg  on Monday and 2.5 ROW   Goal of Therapy:  INR 2-3 Monitor platelets by anticoagulation protocol: Yes   Plan:  - INR currently therapeutic at 2.9 - Patient states she took her warfarin this morning which would have been 2.5mg  - Daily PT-INR and monitor [place] PharmD. BCPS  07/25/2020,9:31 PM

## 2020-07-25 NOTE — H&P (Signed)
History and Physical    [name] [name] [facility] UUV:[id number] DOB: Nov 21, 1931 DOA: 07/25/2020  PCP: Hoyt Koch, MD  Patient coming from: Home, son at bedside  I have personally briefly reviewed patient's old medical records in [place]  Chief Complaint: Worsening left lower extremity pain, nausea vomiting and diarrhea  HPI: [name] [name] is a 86 y.o. female with medical history significant for atrial fibrillation on Coumadin, chronic diastolic heart failure, hypertension, hyperlipidemia and anxiety who presents with worsening left leg pain and nausea,vomiting and diarrhea.  Patient reports that about 3 days ago she began to feel unwell and had several episodes of nausea, vomiting and diarrhea. This has since resolved. Then yesterday she felt acute worsening left leg pain as well as increasing redness.Had temperature of 103F at home.  Son at bedside states that she has been dealing with right lower extremity edema for the past 3 months and has been on diuretics.  However he has noticed for the past 2 days that actually her left lower extremity has become larger compared to the right. She presented to urgent care today with these symptoms and was told to present to the ED due to concerns of DVT and atrial fibrillation with rate of 126. Patient reports compliance with her medication including Coumadin.  ED Course: She was febrile up to 100.8 in atrial fibrillation with RVR up to rates of 160 which is now improved to around 120 following multiple liters of fluid.  Patient remained stable on room air.  CBC did not show leukocytosis but had significant left WBC shift.  Lactate initially 6.7 that has downward trended to 4.4 following fluids. Has AKI with creatinine mildly elevated to 1.10.  CTA chest negative for PE but does have an incidental finding of a subsolid groundglass nodule opacity in the right upper lobe measuring 12 mm with follow-up recommended.  CT abdomen pelvis show fat  stranding involving the left anterior aspect of the upper thigh and tracking along the iliac vessel but no abscess.  There is also prominent left inguinal and external iliac nodes likely reactive from her left lower extremity cellulitis.  No lower extremity DVT or acute vascular findings.  Review of Systems:  Constitutional: No Weight Change, + Fever ENT/Mouth: No sore throat, No Rhinorrhea Eyes: No Eye Pain, No Vision Changes Cardiovascular: No Chest Pain, no SOB, + Edema  Respiratory: No Cough, No Sputum  Gastrointestinal: + Nausea, + Vomiting, + Diarrhea, No Constipation, No Pain Genitourinary: no Urinary Incontinence Musculoskeletal: No Arthralgias, No Myalgias Skin: No Skin Lesions, No Pruritus, Neuro: no Weakness, No Numbness Psych: No Anxiety/Panic, No Depression, no decrease appetite Heme/Lymph: No Bruising, No Bleeding  Past Medical History:  Diagnosis Date  . A-fib ([place])    paroxysmal  . Aftercare for healing traumatic fracture of arm, unspecified   . H/O: hemorrhoidectomy   . H/O: hysterectomy   . Hyperlipidemia   . Hypertension   . Malignant melanoma of skin of trunk, except scrotum ([place])   . Other postprocedural status(V45.89)   . Rosacea   . S/P cholecystectomy   . Sciatica    right  . Sprain of lumbar region     Past Surgical History:  Procedure Laterality Date  . ABDOMINAL HYSTERECTOMY    . BLADDER SUSPENSION     never go surgery because it got canceled due to heart rate   . cataract Bilateral 2019  . catheter ablation of atrail flutter    . CHOLECYSTECTOMY    .  HERNIA REPAIR       reports that she has never smoked. She has never used smokeless tobacco. She reports that she does not drink alcohol and does not use drugs. Social History  Allergies  Allergen Reactions  . Antihistamines, Diphenhydramine-Type Other (See Comments)    ALL ANTIHISTAMINES causes her heart to race  . Oxycodone-Acetaminophen Nausea Only    Family History  Problem Relation  Age of Onset  . Heart failure Mother   . Heart failure Father   . Liver cancer Daughter      Prior to Admission medications   Medication Sig Start Date End Date Taking? Authorizing Provider  acetaminophen (TYLENOL) 500 MG tablet Take 1,000 mg by mouth every 6 (six) hours as needed for headache (pain). For pain    Yes [provider]  ALPRAZolam (XANAX) 0.25 MG tablet Take 1 tablet (0.25 mg total) by mouth daily as needed. for anxiety Patient taking differently: Take 0.25 mg by mouth daily as needed for anxiety.  12/02/19  Yes Hoyt Koch, MD  atorvastatin (LIPITOR) 20 MG tablet Take 1 tablet (20 mg total) by mouth daily at 6 PM. Annual appt due in Sept must see provider for future refills 02/18/20  Yes Hoyt Koch, MD  Calcium Carbonate-Vitamin D (CALCIUM-VITAMIN D) 500-200 MG-UNIT per tablet Take 1 tablet by mouth every other day.    Yes [provider]  Carboxymethylcellulose Sodium (THERATEARS OP) Place 1 drop into both eyes 2 (two) times daily as needed (dry eyes).   Yes [provider]  ENSURE (ENSURE) Take 237 mLs by mouth daily as needed (meal supplement).    Yes [provider]  furosemide (LASIX) 40 MG tablet TAKE 1 TABLET BY MOUTH EVERY DAY Patient taking differently: Take 40 mg by mouth at bedtime. 9pm 06/25/20  Yes Hoyt Koch, MD  hydrochlorothiazide (HYDRODIURIL) 25 MG tablet TAKE 1 TABLET BY MOUTH EVERY DAY Patient taking differently: Take 25 mg by mouth at bedtime. 9 pm 05/04/20  Yes Hoyt Koch, MD  Hydrocortisone (GERHARDT'S BUTT CREAM) CREA Apply 1 application topically daily as needed for irritation.    Yes [provider]  metoprolol succinate (TOPROL-XL) 100 MG 24 hr tablet TAKE 1 DAILY, WITH OR IMMEDIATELY FOLLOWING A MEAL. KEEP UPCOMING APPT WITH DR. Lovena Le IN NOVEMBER Patient taking differently: Take 100 mg by mouth daily.  12/01/19  Yes Evans Lance, MD  Multiple Vitamins-Minerals  (MULTIVITAMIN WITH MINERALS) tablet Take 1 tablet by mouth daily at 6 PM.    Yes [provider]  Multiple Vitamins-Minerals (PRESERVISION AREDS 2) CAPS Take 1 capsule by mouth in the morning and at bedtime.   Yes [provider]  warfarin (COUMADIN) 2.5 MG tablet TAKE 1 TABLET DAILY EXCEPT 2 TABS ON MON AND FRI OR AS DIRECTED BY ANTICOAGULATION CLINIC Patient taking differently: Take 2.5-5 mg by mouth See admin instructions. Take 2 tablets (5 mg) by mouth on Monday mornings, take 1 tablet (2.5 mg) on all other mornings of the week - or as directed by anticoagulation clinic 05/04/20  Yes Hoyt Koch, MD  Zinc Oxide (TRIPLE PASTE) 12.8 % ointment Apply 1 application topically as needed for irritation.   Yes [provider]    Physical Exam: Vitals:   07/25/20 1742 07/25/20 1807 07/25/20 1830 07/25/20 1900  BP:  (!) 142/112 133/84 117/74  Pulse:  (!) 148 (!) 147 (!) 129  Resp:  (!) 28 (!) 35 (!) 36  Temp: (!) 100.8 F (  38.2 C) (!) 100.6 F (38.1 C)    TempSrc: Axillary Oral    SpO2:  95% 94% 95%    Constitutional: NAD, calm, comfortable, fatigued non-toxic but ill-appearing elderly female laying at 20 degree incline in bed Vitals:   07/25/20 1742 07/25/20 1807 07/25/20 1830 07/25/20 1900  BP:  (!) 142/112 133/84 117/74  Pulse:  (!) 148 (!) 147 (!) 129  Resp:  (!) 28 (!) 35 (!) 36  Temp: (!) 100.8 F (38.2 C) (!) 100.6 F (38.1 C)    TempSrc: Axillary Oral    SpO2:  95% 94% 95%   Eyes: PERRL, lids and conjunctivae normal ENMT: Mucous membranes are moist.  Neck: normal, supple Respiratory: clear to auscultation bilaterally, no wheezing, no crackles. Normal respiratory effort. No accessory muscle use.  Cardiovascular: Irregularly irregular rate fluctuating between 110-120 on telemetry, no murmurs / rubs / gallops.  +3 pitting left lower extremity with significant erythema up to knee. Abdomen: no tenderness, no masses palpated. No hepatosplenomegaly.  Bowel sounds positive.  Musculoskeletal: no clubbing / cyanosis. No joint deformity upper and lower extremities. Good ROM, no contractures. Normal muscle tone.  Skin: no rashes, lesions, ulcers. No induration Neurologic: CN 2-12 grossly intact. Sensation intact,  Strength 5/5 in all 4.  Psychiatric: Normal judgment and insight. Alert and oriented x 3. Normal mood.     Labs on Admission: I have personally reviewed following labs and imaging studies  CBC: Recent Labs  Lab 07/25/20 1346  WBC 5.3  NEUTROABS 3.8  HGB 16.4*  HCT 48.8*  MCV 91.2  PLT 889   Basic Metabolic Panel: Recent Labs  Lab 07/25/20 1346  NA 134*  K 3.6  CL 99  CO2 20*  GLUCOSE 183*  BUN 19  CREATININE 1.10*  CALCIUM 9.5   GFR: Estimated Creatinine Clearance: 38.4 mL/min (A) (by C-G formula based on SCr of 1.1 mg/dL (H)). Liver Function Tests: Recent Labs  Lab 07/25/20 1346  AST 51*  ALT 31  ALKPHOS 88  BILITOT QUANTITY NOT SUFFICIENT, UNABLE TO PERFORM TEST  PROT 7.0  ALBUMIN 3.4*   No results for input(s): LIPASE, AMYLASE in the last 168 hours. No results for input(s): AMMONIA in the last 168 hours. Coagulation Profile: Recent Labs  Lab 07/25/20 1656  INR 2.9*   Cardiac Enzymes: No results for input(s): CKTOTAL, CKMB, CKMBINDEX, TROPONINI in the last 168 hours. BNP (last 3 results) No results for input(s): PROBNP in the last 8760 hours. HbA1C: No results for input(s): HGBA1C in the last 72 hours. CBG: No results for input(s): GLUCAP in the last 168 hours. Lipid Profile: No results for input(s): CHOL, HDL, LDLCALC, TRIG, CHOLHDL, LDLDIRECT in the last 72 hours. Thyroid Function Tests: No results for input(s): TSH, T4TOTAL, FREET4, T3FREE, THYROIDAB in the last 72 hours. Anemia Panel: No results for input(s): VITAMINB12, FOLATE, FERRITIN, TIBC, IRON, RETICCTPCT in the last 72 hours. Urine analysis:    Component Value Date/Time   COLORURINE YELLOW 07/25/2020 1758   APPEARANCEUR  CLEAR 07/25/2020 1758   LABSPEC 1.018 07/25/2020 1758   PHURINE 6.0 07/25/2020 1758   GLUCOSEU NEGATIVE 07/25/2020 1758   HGBUR NEGATIVE 07/25/2020 1758   BILIRUBINUR NEGATIVE 07/25/2020 1758   KETONESUR NEGATIVE 07/25/2020 1758   PROTEINUR NEGATIVE 07/25/2020 1758   NITRITE NEGATIVE 07/25/2020 1758   LEUKOCYTESUR NEGATIVE 07/25/2020 1758    Radiological Exams on Admission: CT Angio Chest PE W/Cm &/Or Wo Cm  Result Date: 07/25/2020 CLINICAL DATA:  PE suspected, high prob Left leg  swelling and pain. EXAM: CT ANGIOGRAPHY CHEST WITH CONTRAST TECHNIQUE: Multidetector CT imaging of the chest was performed using the standard protocol during bolus administration of intravenous contrast. Multiplanar CT image reconstructions and MIPs were obtained to evaluate the vascular anatomy. Performed in conjunction with CT of the abdomen/pelvis, reported separately. CONTRAST:  49mL OMNIPAQUE IOHEXOL 350 MG/ML SOLN COMPARISON:  Radiograph earlier today. FINDINGS: Cardiovascular: There are no filling defects within the pulmonary arteries to suggest pulmonary embolus. Atherosclerosis of the thoracic aorta without aneurysm or dissection. Aorta is tortuous. Mild cardiomegaly, with particular left atrial enlargement. Minimal pericardial fluid without significant effusion. Coronary artery calcifications. Mediastinum/Nodes: Small right hilar nodes are subcentimeter and not enlarged by size criteria. There is no mediastinal or left hilar adenopathy. Patulous mid and upper esophagus. No esophageal wall thickening. No suspicious thyroid nodule. Lungs/Pleura: Diffusely heterogeneous pulmonary parenchyma. Sub solid nodular opacity in the right upper lobe measures 12 mm, series 4, image 51. Irregular subpleural opacities involving the right greater than left lung apex are typical of pleuroparenchymal scarring, however appears slightly nodular on the right. No septal thickening or findings of pulmonary edema. No pleural effusion.  Scattered atelectasis in both dependent lower lobes. Excretory phase imaging limits bronchial assessment. Upper Abdomen: Assessed on concurrent abdominal CT, reported separately. Musculoskeletal: Exaggerated upper thoracic kyphosis. Multilevel degenerative change in the spine. There are no acute or suspicious osseous abnormalities. Surgical hardware in the left proximal humerus. Review of the MIP images confirms the above findings. IMPRESSION: 1. No pulmonary embolus. 2. Diffusely heterogeneous pulmonary parenchyma, can be seen with small vessel or small airways disease. 3. Sub solid ground-glass nodular opacity in the right upper lobe measures 12 mm. This is nonspecific, possibly infectious/inflammatory. Follow-up non-contrast CT recommended at 3-6 months to confirm persistence. If unchanged, and solid component remains <6 mm, annual CT is recommended until 5 years of stability has been established. If persistent these nodules should be considered highly suspicious if the solid component of the nodule is 6 mm or greater in size and enlarging. This recommendation follows the consensus statement: Guidelines for Management of Incidental Pulmonary Nodules Detected on CT Images: From the Fleischner Society 2017; Radiology 2017; 284:228-243. 4. Irregular subpleural opacities involving the right greater than left lung apex are typical of pleuroparenchymal scarring, however appears slightly nodular on the right. Recommend attention to this at follow-up. 5. Mild cardiomegaly with particular left atrial enlargement. Coronary artery calcifications. Aortic Atherosclerosis (ICD10-I70.0). Electronically Signed   By: Keith Rake M.D.   On: 07/25/2020 18:27   CT Abdomen Pelvis W Contrast  Result Date: 07/25/2020 CLINICAL DATA:  Acute nonlocalized abdominal pain. Left leg swelling and pain. EXAM: CT ABDOMEN AND PELVIS WITH CONTRAST TECHNIQUE: Multidetector CT imaging of the abdomen and pelvis was performed using the  standard protocol following bolus administration of intravenous contrast. CONTRAST:  22mL OMNIPAQUE IOHEXOL 350 MG/ML SOLN COMPARISON:  No prior abdominal imaging. Concurrent chest CT reviewed. FINDINGS: Lower chest: Assessed on concurrent chest CT. Cardiomegaly. Coronary artery calcifications. Hepatobiliary: Post cholecystectomy. Biliary prominence is likely normal post cholecystectomy. Common bile duct measures 8 mm at the porta hepatis. There is a coarse calcification involving the anterior subcapsular liver, partially obscured by breathing motion artifact. No underlying lesion is seen on delayed phase. No suspicious focal hepatic lesion. Pancreas: Parenchymal atrophy. No ductal dilatation or inflammation. Spleen: Normal in size without focal abnormality. Adrenals/Urinary Tract: No adrenal nodule. No hydronephrosis or perinephric edema. Homogeneous renal enhancement with symmetric excretion on delayed phase imaging. Possible nonobstructing stone  in the lower left kidney versus early excretion of IV contrast. Urinary bladder is distended without wall thickening. Stomach/Bowel: Nondistended stomach. There is no small bowel obstruction or inflammatory change. High-riding cecum in the right upper quadrant. Normal appendix. Interposition of the colon anterior to the liver. Diffuse and multifocal colonic diverticulosis. Sigmoid colon is tortuous. No evidence of diverticulitis. Stool distends the rectum with possible rectocele. Vascular/Lymphatic: There prominent mildly enlarged lymph nodes in the left inguinal region measuring up to 14 mm. Prominent left external iliac nodes measure up to 9 mm. There is fat stranding adjacent to the external iliac and common femoral vessels. No discrete intraluminal filling defect in the venous structures. Arterial vascular calcifications without dissection, or acute arterial abnormality. Additional prominent left retroperitoneal nodes measure up to 8 mm the level of the iliac  bifurcation. Aortic atherosclerosis and tortuosity. No aortic aneurysm. Patent portal vein. Reproductive: Status post hysterectomy. No adnexal masses. Left ovary tentatively visualized and quiescent. Right ovary not definitively seen. Other: No ascites or free fluid. No free air. No abdominopelvic abscess. There is fat stranding involving the left anterior aspect of the thigh which tracks along the iliac vessels into the retroperitoneum. No soft tissue air. Musculoskeletal: Mild to moderate compression fractures of L3 and L4 as well as mild L5 compression fracture likely chronic. Bones are diffusely under mineralized. Diffuse multilevel degenerative change in the spine. No evidence of left hip joint effusion or bony destruction. Fat stranding involving the subcutaneous tissues of the left inguinal region and anterior upper thigh without CT evidence of muscular involvement. IMPRESSION: 1. Fat stranding involving the left anterior aspect of the upper thigh which tracks along the iliac vessels into the retroperitoneum. No soft tissue air, focal fluid collection, or deep muscle involvement. There are also prominent left inguinal and external iliac nodes. Suspect left lower extremity cellulitis with reactive adenopathy, however clinical correlation is needed. 2. No lower extremity DVT or acute vascular findings. 3. Colonic diverticulosis without diverticulitis. Stool distends the rectum with possible rectocele. 4. Compression fractures of L3, L4, and L5 are likely chronic. Aortic Atherosclerosis (ICD10-I70.0). Electronically Signed   By: Keith Rake M.D.   On: 07/25/2020 18:36   DG Chest Port 1 View  Result Date: 07/25/2020 CLINICAL DATA:  Questionable sepsis - evaluate for abnormality Chest pain.  Leg edema. EXAM: PORTABLE CHEST 1 VIEW COMPARISON:  Chest radiograph 09/29/2017 FINDINGS: Patient is rotated. The chin obscures the left lung apex. Left costophrenic angle is not included in the field of view. Mild  cardiomegaly. Aortic atherosclerosis. Questionable ill-defined left basilar opacity versus overlapping soft tissue structures. Mild vascular congestion no pneumothorax. Surgical hardware in the left proximal humerus. IMPRESSION: 1. Mild cardiomegaly and vascular congestion. 2. Questionable ill-defined left basilar opacity versus overlapping soft tissue structures. 3. Limited exam due to patient's chin obscuring the apices, rotation, and left costophrenic angle excluded from the field of view. PA and lateral views recommended when patient is able. Electronically Signed   By: Keith Rake M.D.   On: 07/25/2020 15:58   VAS [place] LOWER EXTREMITY VENOUS (DVT) (MC and WL 7a-7p)  Result Date: 07/25/2020  Lower Venous DVT Study Indications: Pain, and Swelling. Sepsis.  Risk Factors: Atrial fibrillation, therapeutic on Coumadin. Anticoagulation: Coumadin. Limitations: Patient's pain with touch and RN turned lights on in room in order to draw blood and start IV. Comparison Study: No prior study on file Performing Technologist: Sharion Dove RVS  Examination Guidelines: A complete evaluation includes B-mode imaging, spectral Doppler, color  Doppler, and power Doppler as needed of all accessible portions of each vessel. Bilateral testing is considered an integral part of a complete examination. Limited examinations for reoccurring indications may be performed as noted. The reflux portion of the exam is performed with the patient in reverse Trendelenburg.  +-----+---------------+---------+-----------+----------+----------------------+ RIGHTCompressibilityPhasicitySpontaneityPropertiesThrombus Aging         +-----+---------------+---------+-----------+----------+----------------------+ CFV                                               patent by color and                                                      Doppler                 +-----+---------------+---------+-----------+----------+----------------------+   +---------+---------------+---------+-----------+----------+-------------------+ LEFT     CompressibilityPhasicitySpontaneityPropertiesThrombus Aging      +---------+---------------+---------+-----------+----------+-------------------+ CFV      Full           Yes      Yes                                      +---------+---------------+---------+-----------+----------+-------------------+ SFJ      Full                                                             +---------+---------------+---------+-----------+----------+-------------------+ FV Prox                                               patent by color and                                                       Doppler             +---------+---------------+---------+-----------+----------+-------------------+ FV Mid                                                patent by color and                                                       Doppler             +---------+---------------+---------+-----------+----------+-------------------+ FV Distal  patent by color and                                                       Doppler             +---------+---------------+---------+-----------+----------+-------------------+ POP                                                   patent by color and                                                       Doppler             +---------+---------------+---------+-----------+----------+-------------------+ PTV      Full                                                             +---------+---------------+---------+-----------+----------+-------------------+ PERO     Full                                                             +---------+---------------+---------+-----------+----------+-------------------+      Summary: RIGHT: - No evidence of common femoral vein obstruction. pulsatile waveforms  LEFT: - There is no evidence of deep vein thrombosis in the lower extremity. However, portions of this examination were limited- see technologist comments above.  Pulsatile waveforms - Ultrasound characteristics of enlarged lymph nodes noted in the groin.  *See table(s) above for measurements and observations. Electronically signed by Servando Snare MD on 07/25/2020 at 7:41:09 PM.    Final       Assessment/Plan  Severe sepsis secondary to Left LE celluitis  Pt presented in atrial fibrillation with RVR with rates up to 160, febrile with lower extremity cellulitis.  Lactate initially 6.7. Patient initially given vancomycin, cefepime and Flagyl in the ED.  Will continue vancomycin and cefepime pending blood cultures to rule out bacteremia  Continue to trend lactic acid Has already received 2.25 L of LR bolus in the ED.  We will continue IV lactate infusion and monitor fluid status.  AKI Mildly elevated creatinine of 1.10.  Repeat BMP in the morning with continuous fluid.  Avoid nephrotoxic agent.  Atrial fibrillation with RVR Initially presented with heart rate 160 and now improved down to about 120 nonsustained following low-dose of 2.5 mg Lopressor Continue to monitor heart rate on telemetry but will hold off on further rate control medications unless RVR becomes sustained given that her blood pressure is borderline and sometimes hypotensive Therapeutic on warfarin- continue   Hypotension Suspect secondary to severe sepsis Continue to monitor on IV fluids Hold all antihypertensives  Right upper lobe nodule Incidental  finding on CTA chest of a nodule measuring 12 mm Recommend outpatient follow-up imaging in 3 to 6 months.  Likely inflammatory/infectious.  Chronic diastolic heart failure Asymptomatic.  Continue to monitor closely with fluid resuscitation.  Hyperlipidemia continue statin  DVT  prophylaxis:.warfarin  Code Status: Full Family Communication: Plan discussed with patient and son at bedside.  Also discussed extensively plan with daughter over the phone and all questions and concerns were addressed. disposition Plan: Home with at least 2 midnight stays  Consults called:  Admission status: inpatient Status is: Inpatient  Remains inpatient appropriate because:Inpatient level of care appropriate due to severity of illness   Dispo: The patient is from: Home              Anticipated d/c is to: Home              Anticipated d/c date is: > 3 days              Patient currently is not medically stable to d/c.         Orene Desanctis DO [facility]   If 7PM-7AM, please contact night-coverage [email or web address]   07/25/2020, 8:51 PM

## 2020-07-25 NOTE — Progress Notes (Signed)
VASCULAR LAB    Left lower extremity venous dulex completed.    Preliminary report:  See CV proc for preliminary results.  Gave report to Dr Melina Copa.  Jamacia Jester, RVT 07/25/2020, 5:17 PM

## 2020-07-25 NOTE — ED Provider Notes (Signed)
[facility] EMERGENCY DEPARTMENT Provider Note   CSN: [id number] Arrival date & time: 07/25/20  1133     History Chief Complaint  Patient presents with  . Leg Pain    [name] [name] is a 86 y.o. female.  She has a history of A. fib and is on Coumadin.  She presents with her son from urgent care.  Few days of nausea vomiting diarrhea.  Increased swelling and redness and pain in her bilateral legs left greater than right.  Went to urgent care who recommended she come to the emergency department.  Patient was had a low-grade temp in triage hypertensive tachycardic 120s rising to 160s.  Tremulous.  The history is provided by the patient and a relative.  Leg Pain Location:  Leg Injury: no   Leg location:  L leg and R leg Pain details:    Quality:  Throbbing   Severity:  Moderate   Onset quality:  Gradual   Timing:  Constant   Progression:  Worsening Chronicity:  New Relieved by:  Nothing Worsened by:  Bearing weight Ineffective treatments:  None tried Associated symptoms: fever and swelling   Associated symptoms: no back pain        Past Medical History:  Diagnosis Date  . A-fib ([place])    paroxysmal  . Aftercare for healing traumatic fracture of arm, unspecified   . H/O: hemorrhoidectomy   . H/O: hysterectomy   . Hyperlipidemia   . Hypertension   . Malignant melanoma of skin of trunk, except scrotum ([place])   . Other postprocedural status(V45.89)   . Rosacea   . S/P cholecystectomy   . Sciatica    right  . Sprain of lumbar region     Patient Active Problem List   Diagnosis Date Noted  . Chronic diastolic heart failure ([place]) 10/27/2019  . Anxiety 05/02/2017  . Routine general medical examination at a health care facility 08/13/2015  . Tremor of hands and face 10/21/2013  . Skin lesion 10/07/2008  . SCIATICA, RIGHT 06/03/2008  . Hyperlipidemia 09/06/2007  . Essential hypertension 09/06/2007  . Atrial fibrillation ([place]) 09/06/2007    Past  Surgical History:  Procedure Laterality Date  . ABDOMINAL HYSTERECTOMY    . BLADDER SUSPENSION     never go surgery because it got canceled due to heart rate   . cataract Bilateral 2019  . catheter ablation of atrail flutter    . CHOLECYSTECTOMY    . HERNIA REPAIR       OB History   No obstetric history on file.     Family History  Problem Relation Age of Onset  . Heart failure Mother   . Heart failure Father   . Liver cancer Daughter     Social History   Tobacco Use  . Smoking status: Never Smoker  . Smokeless tobacco: Never Used  Substance Use Topics  . Alcohol use: No  . Drug use: No    Home Medications Prior to Admission medications   Medication Sig Start Date End Date Taking? Authorizing Provider  warfarin (COUMADIN) 2.5 MG tablet TAKE 1 TABLET DAILY EXCEPT 2 TABS ON MON AND FRI OR AS DIRECTED BY ANTICOAGULATION CLINIC 05/04/20   Hoyt Koch, MD  acetaminophen (TYLENOL) 500 MG tablet Take 1,000 mg by mouth every 6 (six) hours as needed. For pain    [provider]  ALPRAZolam (XANAX) 0.25 MG tablet Take 1 tablet (0.25 mg total) by mouth daily as needed. for anxiety 12/02/19  Hoyt Koch, MD  atorvastatin (LIPITOR) 20 MG tablet Take 1 tablet (20 mg total) by mouth daily at 6 PM. Annual appt due in Sept must see provider for future refills 02/18/20   Hoyt Koch, MD  Calcium Carbonate-Vitamin D (CALCIUM-VITAMIN D) 500-200 MG-UNIT per tablet Take 1 tablet by mouth every other day.     [provider]  ENSURE (ENSURE) Take 237 mLs by mouth as needed.     [provider]  furosemide (LASIX) 40 MG tablet TAKE 1 TABLET BY MOUTH EVERY DAY 06/25/20   Hoyt Koch, MD  hydrochlorothiazide (HYDRODIURIL) 25 MG tablet TAKE 1 TABLET BY MOUTH EVERY DAY 05/04/20   Hoyt Koch, MD  Hydrocortisone (GERHARDT'S BUTT CREAM) CREA Apply 1 application topically daily as needed for irritation.     [provider]    metoprolol succinate (TOPROL-XL) 100 MG 24 hr tablet TAKE 1 DAILY, WITH OR IMMEDIATELY FOLLOWING A MEAL. KEEP UPCOMING APPT WITH DR. Lovena Le IN NOVEMBER 12/01/19   Evans Lance, MD  Multiple Vitamins-Minerals (MULTIVITAMIN WITH MINERALS) tablet Take 1 tablet by mouth daily.      [provider]  Zinc Oxide (TRIPLE PASTE) 12.8 % ointment Apply 1 application topically as needed for irritation.    [provider]    Allergies    Antihistamines, diphenhydramine-type and Oxycodone-acetaminophen  Review of Systems   Review of Systems  Constitutional: Positive for chills and fever.  HENT: Negative for sore throat.   Eyes: Negative for visual disturbance.  Respiratory: Negative for shortness of breath.   Cardiovascular: Positive for leg swelling. Negative for chest pain.  Gastrointestinal: Positive for diarrhea, nausea and vomiting. Negative for abdominal pain.  Genitourinary: Negative for dysuria.  Musculoskeletal: Negative for back pain.  Skin: Positive for rash.  Neurological: Negative for headaches.    Physical Exam Updated Vital Signs BP (!) 166/116 (BP Location: Right Arm)   Pulse (!) 163   Temp 99.4 F (37.4 C) (Oral)   Resp 18   SpO2 99%   Physical Exam Vitals and nursing note reviewed.  Constitutional:      General: She is not in acute distress.    Appearance: She is well-developed. She is ill-appearing.  HENT:     Head: Normocephalic and atraumatic.  Eyes:     Conjunctiva/sclera: Conjunctivae normal.  Cardiovascular:     Rate and Rhythm: Regular rhythm. Tachycardia present.     Heart sounds: No murmur heard.   Pulmonary:     Effort: Pulmonary effort is normal. No respiratory distress.     Breath sounds: Normal breath sounds.  Abdominal:     Palpations: Abdomen is soft.     Tenderness: There is no abdominal tenderness. There is no guarding or rebound.  Musculoskeletal:        General: Swelling and tenderness present.     Cervical back: Neck  supple.     Right lower leg: Edema present.     Left lower leg: Edema present.  Skin:    General: Skin is warm and dry.     Capillary Refill: Capillary refill takes less than 2 seconds.     Findings: Erythema present.  Neurological:     General: No focal deficit present.     Mental Status: She is alert.     Comments: Generalized weakness     ED Results / Procedures / Treatments   Labs (all labs ordered are listed, but only abnormal results are displayed) Labs Reviewed  LACTIC  ACID, PLASMA - Abnormal; Notable for the following components:      Result Value   Lactic Acid, Venous 6.7 (*)    All other components within normal limits  LACTIC ACID, PLASMA - Abnormal; Notable for the following components:   Lactic Acid, Venous 5.8 (*)    All other components within normal limits  COMPREHENSIVE METABOLIC PANEL - Abnormal; Notable for the following components:   Sodium 134 (*)    CO2 20 (*)    Glucose, Bld 183 (*)    Creatinine, Ser 1.10 (*)    Albumin 3.4 (*)    AST 51 (*)    GFR calc non Af Amer 45 (*)    GFR calc Af Amer 53 (*)    All other components within normal limits  CBC WITH DIFFERENTIAL/PLATELET - Abnormal; Notable for the following components:   RBC 5.35 (*)    Hemoglobin 16.4 (*)    HCT 48.8 (*)    Abs Immature Granulocytes 0.30 (*)    All other components within normal limits  PROTIME-INR - Abnormal; Notable for the following components:   Prothrombin Time 29.6 (*)    INR 2.9 (*)    All other components within normal limits  APTT - Abnormal; Notable for the following components:   aPTT 45 (*)    All other components within normal limits  LACTIC ACID, PLASMA - Abnormal; Notable for the following components:   Lactic Acid, Venous 4.4 (*)    All other components within normal limits  SARS CORONAVIRUS 2 BY RT PCR (HOSPITAL ORDER, [facility])  CULTURE, BLOOD (ROUTINE X 2)  URINE CULTURE  C DIFFICILE QUICK SCREEN W PCR REFLEX    URINALYSIS, ROUTINE W REFLEX MICROSCOPIC  LACTIC ACID, PLASMA  PROTIME-INR    EKG EKG Interpretation  Date/Time:  Sunday July 25 2020 15:11:58 EDT Ventricular Rate:  166 PR Interval:    QRS Duration: 74 QT Interval:  240 QTC Calculation: 398 R Axis:   117 Text Interpretation: Supraventricular tachycardia Right axis deviation Low voltage QRS Cannot rule out Anterior infarct , age undetermined Abnormal ECG new from prior 11/18 Confirmed by Aletta Edouard [phone]) on 07/25/2020 3:19:55 PM   Radiology CT Angio Chest PE W/Cm &/Or Wo Cm  Result Date: 07/25/2020 CLINICAL DATA:  PE suspected, high prob Left leg swelling and pain. EXAM: CT ANGIOGRAPHY CHEST WITH CONTRAST TECHNIQUE: Multidetector CT imaging of the chest was performed using the standard protocol during bolus administration of intravenous contrast. Multiplanar CT image reconstructions and MIPs were obtained to evaluate the vascular anatomy. Performed in conjunction with CT of the abdomen/pelvis, reported separately. CONTRAST:  23mL OMNIPAQUE IOHEXOL 350 MG/ML SOLN COMPARISON:  Radiograph earlier today. FINDINGS: Cardiovascular: There are no filling defects within the pulmonary arteries to suggest pulmonary embolus. Atherosclerosis of the thoracic aorta without aneurysm or dissection. Aorta is tortuous. Mild cardiomegaly, with particular left atrial enlargement. Minimal pericardial fluid without significant effusion. Coronary artery calcifications. Mediastinum/Nodes: Small right hilar nodes are subcentimeter and not enlarged by size criteria. There is no mediastinal or left hilar adenopathy. Patulous mid and upper esophagus. No esophageal wall thickening. No suspicious thyroid nodule. Lungs/Pleura: Diffusely heterogeneous pulmonary parenchyma. Sub solid nodular opacity in the right upper lobe measures 12 mm, series 4, image 51. Irregular subpleural opacities involving the right greater than left lung apex are typical of pleuroparenchymal  scarring, however appears slightly nodular on the right. No septal thickening or findings of pulmonary edema. No pleural effusion. Scattered  atelectasis in both dependent lower lobes. Excretory phase imaging limits bronchial assessment. Upper Abdomen: Assessed on concurrent abdominal CT, reported separately. Musculoskeletal: Exaggerated upper thoracic kyphosis. Multilevel degenerative change in the spine. There are no acute or suspicious osseous abnormalities. Surgical hardware in the left proximal humerus. Review of the MIP images confirms the above findings. IMPRESSION: 1. No pulmonary embolus. 2. Diffusely heterogeneous pulmonary parenchyma, can be seen with small vessel or small airways disease. 3. Sub solid ground-glass nodular opacity in the right upper lobe measures 12 mm. This is nonspecific, possibly infectious/inflammatory. Follow-up non-contrast CT recommended at 3-6 months to confirm persistence. If unchanged, and solid component remains <6 mm, annual CT is recommended until 5 years of stability has been established. If persistent these nodules should be considered highly suspicious if the solid component of the nodule is 6 mm or greater in size and enlarging. This recommendation follows the consensus statement: Guidelines for Management of Incidental Pulmonary Nodules Detected on CT Images: From the Fleischner Society 2017; Radiology 2017; 284:228-243. 4. Irregular subpleural opacities involving the right greater than left lung apex are typical of pleuroparenchymal scarring, however appears slightly nodular on the right. Recommend attention to this at follow-up. 5. Mild cardiomegaly with particular left atrial enlargement. Coronary artery calcifications. Aortic Atherosclerosis (ICD10-I70.0). Electronically Signed   By: Keith Rake M.D.   On: 07/25/2020 18:27   CT Abdomen Pelvis W Contrast  Result Date: 07/25/2020 CLINICAL DATA:  Acute nonlocalized abdominal pain. Left leg swelling and pain.  EXAM: CT ABDOMEN AND PELVIS WITH CONTRAST TECHNIQUE: Multidetector CT imaging of the abdomen and pelvis was performed using the standard protocol following bolus administration of intravenous contrast. CONTRAST:  27mL OMNIPAQUE IOHEXOL 350 MG/ML SOLN COMPARISON:  No prior abdominal imaging. Concurrent chest CT reviewed. FINDINGS: Lower chest: Assessed on concurrent chest CT. Cardiomegaly. Coronary artery calcifications. Hepatobiliary: Post cholecystectomy. Biliary prominence is likely normal post cholecystectomy. Common bile duct measures 8 mm at the porta hepatis. There is a coarse calcification involving the anterior subcapsular liver, partially obscured by breathing motion artifact. No underlying lesion is seen on delayed phase. No suspicious focal hepatic lesion. Pancreas: Parenchymal atrophy. No ductal dilatation or inflammation. Spleen: Normal in size without focal abnormality. Adrenals/Urinary Tract: No adrenal nodule. No hydronephrosis or perinephric edema. Homogeneous renal enhancement with symmetric excretion on delayed phase imaging. Possible nonobstructing stone in the lower left kidney versus early excretion of IV contrast. Urinary bladder is distended without wall thickening. Stomach/Bowel: Nondistended stomach. There is no small bowel obstruction or inflammatory change. High-riding cecum in the right upper quadrant. Normal appendix. Interposition of the colon anterior to the liver. Diffuse and multifocal colonic diverticulosis. Sigmoid colon is tortuous. No evidence of diverticulitis. Stool distends the rectum with possible rectocele. Vascular/Lymphatic: There prominent mildly enlarged lymph nodes in the left inguinal region measuring up to 14 mm. Prominent left external iliac nodes measure up to 9 mm. There is fat stranding adjacent to the external iliac and common femoral vessels. No discrete intraluminal filling defect in the venous structures. Arterial vascular calcifications without dissection,  or acute arterial abnormality. Additional prominent left retroperitoneal nodes measure up to 8 mm the level of the iliac bifurcation. Aortic atherosclerosis and tortuosity. No aortic aneurysm. Patent portal vein. Reproductive: Status post hysterectomy. No adnexal masses. Left ovary tentatively visualized and quiescent. Right ovary not definitively seen. Other: No ascites or free fluid. No free air. No abdominopelvic abscess. There is fat stranding involving the left anterior aspect of the thigh which tracks along the  iliac vessels into the retroperitoneum. No soft tissue air. Musculoskeletal: Mild to moderate compression fractures of L3 and L4 as well as mild L5 compression fracture likely chronic. Bones are diffusely under mineralized. Diffuse multilevel degenerative change in the spine. No evidence of left hip joint effusion or bony destruction. Fat stranding involving the subcutaneous tissues of the left inguinal region and anterior upper thigh without CT evidence of muscular involvement. IMPRESSION: 1. Fat stranding involving the left anterior aspect of the upper thigh which tracks along the iliac vessels into the retroperitoneum. No soft tissue air, focal fluid collection, or deep muscle involvement. There are also prominent left inguinal and external iliac nodes. Suspect left lower extremity cellulitis with reactive adenopathy, however clinical correlation is needed. 2. No lower extremity DVT or acute vascular findings. 3. Colonic diverticulosis without diverticulitis. Stool distends the rectum with possible rectocele. 4. Compression fractures of L3, L4, and L5 are likely chronic. Aortic Atherosclerosis (ICD10-I70.0). Electronically Signed   By: Keith Rake M.D.   On: 07/25/2020 18:36   DG Chest Port 1 View  Result Date: 07/25/2020 CLINICAL DATA:  Questionable sepsis - evaluate for abnormality Chest pain.  Leg edema. EXAM: PORTABLE CHEST 1 VIEW COMPARISON:  Chest radiograph 09/29/2017 FINDINGS:  Patient is rotated. The chin obscures the left lung apex. Left costophrenic angle is not included in the field of view. Mild cardiomegaly. Aortic atherosclerosis. Questionable ill-defined left basilar opacity versus overlapping soft tissue structures. Mild vascular congestion no pneumothorax. Surgical hardware in the left proximal humerus. IMPRESSION: 1. Mild cardiomegaly and vascular congestion. 2. Questionable ill-defined left basilar opacity versus overlapping soft tissue structures. 3. Limited exam due to patient's chin obscuring the apices, rotation, and left costophrenic angle excluded from the field of view. PA and lateral views recommended when patient is able. Electronically Signed   By: Keith Rake M.D.   On: 07/25/2020 15:58   VAS [place] LOWER EXTREMITY VENOUS (DVT) ([facility] and [facility] 7a-7p)  Result Date: 07/25/2020  Lower Venous DVT Study Indications: Pain, and Swelling. Sepsis.  Risk Factors: Atrial fibrillation, therapeutic on Coumadin. Anticoagulation: Coumadin. Limitations: Patient's pain with touch and RN turned lights on in room in order to draw blood and start IV. Comparison Study: No prior study on file Performing Technologist: Sharion Dove RVS  Examination Guidelines: A complete evaluation includes B-mode imaging, spectral Doppler, color Doppler, and power Doppler as needed of all accessible portions of each vessel. Bilateral testing is considered an integral part of a complete examination. Limited examinations for reoccurring indications may be performed as noted. The reflux portion of the exam is performed with the patient in reverse Trendelenburg.  +-----+---------------+---------+-----------+----------+----------------------+ RIGHTCompressibilityPhasicitySpontaneityPropertiesThrombus Aging         +-----+---------------+---------+-----------+----------+----------------------+ CFV                                               patent by color and                                                       Doppler                +-----+---------------+---------+-----------+----------+----------------------+   +---------+---------------+---------+-----------+----------+-------------------+ LEFT     CompressibilityPhasicitySpontaneityPropertiesThrombus Aging      +---------+---------------+---------+-----------+----------+-------------------+  CFV      Full           Yes      Yes                                      +---------+---------------+---------+-----------+----------+-------------------+ SFJ      Full                                                             +---------+---------------+---------+-----------+----------+-------------------+ FV Prox                                               patent by color and                                                       Doppler             +---------+---------------+---------+-----------+----------+-------------------+ FV Mid                                                patent by color and                                                       Doppler             +---------+---------------+---------+-----------+----------+-------------------+ FV Distal                                             patent by color and                                                       Doppler             +---------+---------------+---------+-----------+----------+-------------------+ POP                                                   patent by color and                                                       Doppler             +---------+---------------+---------+-----------+----------+-------------------+  PTV      Full                                                             +---------+---------------+---------+-----------+----------+-------------------+ PERO     Full                                                              +---------+---------------+---------+-----------+----------+-------------------+     Summary: RIGHT: - No evidence of common femoral vein obstruction. pulsatile waveforms  LEFT: - There is no evidence of deep vein thrombosis in the lower extremity. However, portions of this examination were limited- see technologist comments above.  Pulsatile waveforms - Ultrasound characteristics of enlarged lymph nodes noted in the groin.  *See table(s) above for measurements and observations. Electronically signed by Servando Snare MD on 07/25/2020 at 7:41:09 PM.    Final     Procedures .Critical Care Performed by: Hayden Rasmussen, MD Authorized by: Hayden Rasmussen, MD   Critical care provider statement:    Critical care time (minutes):  45   Critical care time was exclusive of:  Separately billable procedures and treating other patients   Critical care was necessary to treat or prevent imminent or life-threatening deterioration of the following conditions:  Sepsis   Critical care was time spent personally by me on the following activities:  Discussions with consultants, evaluation of patient's response to treatment, examination of patient, ordering and performing treatments and interventions, ordering and review of laboratory studies, ordering and review of radiographic studies, pulse oximetry, re-evaluation of patient's condition, obtaining history from patient or surrogate, review of old charts and development of treatment plan with patient or surrogate   I assumed direction of critical care for this patient from another provider in my specialty: no     (including critical care time)  Medications Ordered in ED Medications  lactated ringers infusion ( Intravenous New Bag/Given 07/25/20 1750)  ceFEPIme (MAXIPIME) 2 g in sodium chloride 0.9 % 100 mL IVPB (has no administration in time range)  vancomycin (VANCOCIN) IVPB 1000 mg/200 mL premix (has no administration in time range)  iohexol (OMNIPAQUE) 350 MG/ML  injection 80 mL (has no administration in time range)  acetaminophen (TYLENOL) tablet 650 mg (has no administration in time range)  atorvastatin (LIPITOR) tablet 20 mg (has no administration in time range)  ALPRAZolam (XANAX) tablet 0.25 mg (has no administration in time range)  Warfarin - Pharmacist Dosing Inpatient (has no administration in time range)  lactated ringers bolus 1,000 mL (0 mLs Intravenous Stopped 07/25/20 1742)    And  lactated ringers bolus 1,000 mL (0 mLs Intravenous Stopped 07/25/20 1741)    And  lactated ringers bolus 250 mL (0 mLs Intravenous Stopped 07/25/20 1750)  ceFEPIme (MAXIPIME) 2 g in sodium chloride 0.9 % 100 mL IVPB (0 g Intravenous Stopped 07/25/20 1622)  metroNIDAZOLE (FLAGYL) IVPB 500 mg (0 mg Intravenous Stopped 07/25/20 1729)  vancomycin (VANCOREADY) IVPB 1500 mg/300 mL (0 mg Intravenous Stopped 07/25/20 1838)  metoprolol tartrate (LOPRESSOR) injection 2.5 mg (2.5 mg Intravenous Given 07/25/20 1811)  acetaminophen (TYLENOL) tablet 650 mg (  650 mg Oral Given 07/25/20 1812)  iohexol (OMNIPAQUE) 350 MG/ML injection 80 mL (80 mLs Intravenous Contrast Given 07/25/20 1811)  metoprolol tartrate (LOPRESSOR) injection 2.5 mg (2.5 mg Intravenous Given 07/25/20 1921)    ED Course  I have reviewed the triage vital signs and the nursing notes.  Pertinent labs & imaging results that were available during my care of the patient were reviewed by me and considered in my medical decision making (see chart for details).  Clinical Course as of Jul 25 1916  Nancy Fetter Jul 25, 2020  1657 Patient was a very difficult IV access and it took a while for her to get a ultrasound-guided peripheral IV by Dr. Sedonia Small .  She is getting her IV fluids and antibiotics now.  Unfortunately she was in the waiting room for 3 and half hours plus before she got back into her room.    [MB]  4097 Duplex left lower extremity does not show any obvious DVT in the left and none in the proximal right.   [MB]  3532  Activated code sepsis when she was evaluated in her room.  Source is likely her leg but she had diarrhea   [MB]  1659 And so gave her empiric antibiotics for undifferentiated sepsis.   [MB]  62 Reviewed work-up with Dr. Florene Glen from [facility] hospitalist.  He asked if we can get a CT chest and a CT abdomen and pelvis and then we consult [facility] regarding admission.   [MB]  9924 Patient's fever going up.  Tylenol ordered.  We will try her on some IV metoprolol to see if we get better rate control.  Hard to sort out what her heart rate is with her shaking but definitely elevated.   [MB]  1913 Discussed with Dr. Flossie Buffy [facility] hospitalist who will evaluate the patient for admission.   [MB]    Clinical Course User Index [MB] Hayden Rasmussen, MD   MDM Rules/Calculators/A&P                         This patient complains of swelling and pain in both of her legs left greater than right, malaise, nausea and diarrhea; this involves an extensive number of treatment Options and is a complaint that carries with it a high risk of complications and Morbidity. The differential includes sepsis, cellulitis, DVT, PE, anemia, pneumonia, Covid, intra-abdominal process  I ordered, reviewed and interpreted labs, which included CBC with normal white count, stable hemoglobin and platelets, chemistries fairly unremarkable other than some low bicarb reflecting some acidosis and elevated glucose, Covid testing negative, lactate markedly elevated at 6.7 and trending down slowly, urinalysis without signs of infection I ordered medication IV fluids per sepsis protocol, IV antibiotics for undifferentiated sepsis, IV beta-blocker for A. fib with rapid ventricular response I ordered imaging studies which included chest x-ray, CT chest abdomen and pelvis and I independently    visualized and interpreted imaging which showed no definitive source for sepsis, multiple incidental findings, CT is showing some inflammatory changes in her  right proximal thigh which may be reflective of the cellulitis that she clinically has Additional history obtained from patient's son Previous records obtained and reviewed in epic, no recent admissions I consulted [facility] hospitalist Dr. To and discussed lab and imaging findings  Critical Interventions: Management of sepsis and A. fib with rapid ventricular response with fluids antibiotics IV beta-blockers.  After the interventions stated above, I reevaluated the patient and found patient to  remain still critically ill.  Heart rate trending in the right direction.  Tachypneic but lungs clear.  Will need admission for continued antibiotics and resuscitation.  CHA2DS2/VAS Stroke Risk Points  Current as of 13 minutes ago     5 >= 2 Points: High Risk  1 - 1.99 Points: Medium Risk  0 Points: Low Risk    No Change      Details    This score determines the patient's risk of having a stroke if the  patient has atrial fibrillation.       Points Metrics  1 Has Congestive Heart Failure:  Yes    Current as of 13 minutes ago  0 Has Vascular Disease:  No    Current as of 13 minutes ago  1 Has Hypertension:  Yes    Current as of 13 minutes ago  2 Age:  18    Current as of 13 minutes ago  0 Has Diabetes:  No    Current as of 13 minutes ago  0 Had Stroke:  No  Had TIA:  No  Had thromboembolism:  No    Current as of 13 minutes ago  1 Female:  Yes    Current as of 13 minutes ago     Sepsis - Repeat Assessment  Performed at: 2020     Vitals     Blood pressure 117/74, pulse (!) 129, temperature (!) 100.6 F (38.1 C), temperature source Oral, resp. rate (!) 36, SpO2 95 %.  Heart:     Tachycardic  Lungs:    CTA  Capillary Refill:   <2 sec  Peripheral Pulse:   Radial pulse palpable  Skin:     Dry         Final Clinical Impression(s) / ED Diagnoses Final diagnoses:  Severe sepsis ([place])  Cellulitis of left lower extremity  Atrial fibrillation with rapid ventricular response [facility])      Rx / DC Orders ED Discharge Orders    None       Hayden Rasmussen, MD 07/25/20 2237

## 2020-07-25 NOTE — ED Triage Notes (Signed)
Pt c/o N/Dx2 days. Pt c/o swollen, red left leg, painful w/ambulationx1-2 wks. Pt's LLE has 2+ pitting edema, erythemaous, 2+ left pedal pulse, hot to touch, pt able to wiggle toes.

## 2020-07-25 NOTE — ED Provider Notes (Signed)
Ultrasound ED Peripheral IV (Provider)  Date/Time: 07/25/2020 4:38 PM Performed by: Maudie Flakes, MD Authorized by: Maudie Flakes, MD   Procedure details:    Indications: multiple failed IV attempts     Skin Prep: chlorhexidine gluconate     Location: Right basilic vein.   Angiocath:  20 G   Bedside Ultrasound Guided: Yes     Patient tolerated procedure without complications: Yes     Dressing applied: Yes        Maudie Flakes, MD 07/25/20 [phone]

## 2020-07-26 ENCOUNTER — Ambulatory Visit (HOSPITAL_COMMUNITY): Payer: Self-pay

## 2020-07-26 DIAGNOSIS — A409 Streptococcal sepsis, unspecified: Principal | ICD-10-CM

## 2020-07-26 DIAGNOSIS — R6521 Severe sepsis with septic shock: Secondary | ICD-10-CM

## 2020-07-26 LAB — BLOOD CULTURE ID PANEL (REFLEXED) - BCID2

## 2020-07-26 LAB — LACTIC ACID, PLASMA
Lactic Acid, Venous: 3.8 mmol/L (ref 0.5–1.9)
Lactic Acid, Venous: 3.2 mmol/L (ref 0.5–1.9)
Lactic Acid, Venous: 3.5 mmol/L (ref 0.5–1.9)

## 2020-07-26 LAB — BASIC METABOLIC PANEL
Anion gap: 12 (ref 5–15)
BUN: 20 mg/dL (ref 8–23)
CO2: 21 mmol/L — ABNORMAL LOW (ref 22–32)
Calcium: 8.5 mg/dL — ABNORMAL LOW (ref 8.9–10.3)
Chloride: 102 mmol/L (ref 98–111)
Creatinine, Ser: 0.85 mg/dL (ref 0.44–1.00)
GFR calc Af Amer: >60 mL/min (ref >60)
GFR calc non Af Amer: >60 mL/min (ref >60)
Glucose, Bld: 108 mg/dL — ABNORMAL HIGH (ref 70–99)
Potassium: 3.1 mmol/L — ABNORMAL LOW (ref 3.5–5.1)
Sodium: 135 mmol/L (ref 135–145)

## 2020-07-26 LAB — PROTIME-INR
INR: 4.1 (ref 0.8–1.2)
Prothrombin Time: 38.7 seconds — ABNORMAL HIGH (ref 11.4–15.2)

## 2020-07-26 LAB — URINE CULTURE: Culture: NO GROWTH

## 2020-07-26 LAB — CBC
HCT: 40.2 % (ref 36.0–46.0)
Hemoglobin: 13.4 g/dL (ref 12.0–15.0)
MCH: 30.6 pg (ref 26.0–34.0)
MCHC: 33.3 g/dL (ref 30.0–36.0)
MCV: 91.8 fL (ref 80.0–100.0)
Platelets: 171 10*3/uL (ref 150–400)
RBC: 4.38 MIL/uL (ref 3.87–5.11)
RDW: 13.3 % (ref 11.5–15.5)
WBC: 8.6 10*3/uL (ref 4.0–10.5)
nRBC: 0 % (ref 0.0–0.2)

## 2020-07-26 MED ORDER — METOPROLOL TARTRATE 5 MG/5ML IV SOLN
2.5000 mg | Freq: Once | INTRAVENOUS | Status: AC
  Administered 2020-07-27: 2.5 mg via INTRAVENOUS

## 2020-07-26 MED ORDER — LACTATED RINGERS IV BOLUS
1000.0000 mL | Freq: Once | INTRAVENOUS | Status: AC
  Administered 2020-07-26: 1000 mL via INTRAVENOUS

## 2020-07-26 MED ORDER — LACTATED RINGERS IV BOLUS
500.0000 mL | Freq: Once | INTRAVENOUS | Status: AC
  Administered 2020-07-26: 500 mL via INTRAVENOUS

## 2020-07-26 MED ORDER — ONDANSETRON HCL 4 MG/2ML IJ SOLN
4.0000 mg | Freq: Four times a day (QID) | INTRAMUSCULAR | Status: DC | PRN
  Administered 2020-07-26: 4 mg via INTRAVENOUS
  Filled 2020-07-26: qty 2

## 2020-07-26 MED ORDER — SODIUM CHLORIDE 0.9 % IV SOLN
2.0000 g | INTRAVENOUS | Status: DC
  Administered 2020-07-26 – 2020-07-27 (×2): 2 g via INTRAVENOUS
  Filled 2020-07-26: qty 2
  Filled 2020-07-26 (×2): qty 20

## 2020-07-26 MED ORDER — METOPROLOL TARTRATE 5 MG/5ML IV SOLN
2.5000 mg | Freq: Once | INTRAVENOUS | Status: AC
  Administered 2020-07-26: 2.5 mg via INTRAVENOUS
  Filled 2020-07-26: qty 5

## 2020-07-26 MED ORDER — POTASSIUM CHLORIDE CRYS ER 20 MEQ PO TBCR
40.0000 meq | EXTENDED_RELEASE_TABLET | Freq: Two times a day (BID) | ORAL | Status: AC
  Administered 2020-07-26 (×2): 40 meq via ORAL
  Filled 2020-07-26 (×2): qty 2

## 2020-07-26 MED ORDER — SODIUM CHLORIDE 0.9 % IV SOLN: 2.0000 g | INTRAVENOUS | Status: DC

## 2020-07-26 MED ORDER — METOPROLOL TARTRATE 5 MG/5ML IV SOLN
INTRAVENOUS | Status: AC
  Filled 2020-07-26: qty 5

## 2020-07-26 NOTE — Progress Notes (Signed)
PHARMACY - PHYSICIAN COMMUNICATION CRITICAL VALUE ALERT - BLOOD CULTURE IDENTIFICATION (BCID)  [name] [name] is an 86 y.o. female who presented to [facility] on 07/25/2020 with a chief complaint of NVD and left leg erythema, warmth and edema. Lower extremity US negative for DVT and CT angiogram negative for PE. Due to concern for LLE cellulitis, patient was started on empiric cefepime and vancomycin. WBC WNL and patient with Tmax 100.6 - lactic acid was elevated at 4.4 yesterday evening and has decreased to 3.8 with fluids.  Assessment:  Only 1 bottle for BCx drawn - now positive for Strep spp. Patient with possible LLE cellulitis without purulence - likely source would be Strep spp.   Name of physician (or Provider) Contacted: Dr. Maryland Pink  Current antibiotics: Cefepime and vancomycin  Changes to prescribed antibiotics recommended:  Notified MD of results without response Recommend discontinuing vancomycin and cefepime  Initiate ceftriaxone per rapid diagnostics protocol  Results for orders placed or performed during the hospital encounter of 07/25/20  Blood Culture ID Panel (Reflexed) (Collected: 07/25/2020  3:30 PM)  Result Value Ref Range   Enterococcus faecalis NOT DETECTED NOT DETECTED   Enterococcus Faecium NOT DETECTED NOT DETECTED   Listeria monocytogenes NOT DETECTED NOT DETECTED   Staphylococcus species NOT DETECTED NOT DETECTED   Staphylococcus aureus (BCID) NOT DETECTED NOT DETECTED   Staphylococcus epidermidis NOT DETECTED NOT DETECTED   Staphylococcus lugdunensis NOT DETECTED NOT DETECTED   Streptococcus species DETECTED (A) NOT DETECTED   Streptococcus agalactiae NOT DETECTED NOT DETECTED   Streptococcus pneumoniae NOT DETECTED NOT DETECTED   Streptococcus pyogenes NOT DETECTED NOT DETECTED   A.calcoaceticus-baumannii NOT DETECTED NOT DETECTED   Bacteroides fragilis NOT DETECTED NOT DETECTED   Enterobacterales NOT DETECTED NOT DETECTED   Enterobacter cloacae complex  NOT DETECTED NOT DETECTED   Escherichia coli NOT DETECTED NOT DETECTED   Klebsiella aerogenes NOT DETECTED NOT DETECTED   Klebsiella oxytoca NOT DETECTED NOT DETECTED   Klebsiella pneumoniae NOT DETECTED NOT DETECTED   Proteus species NOT DETECTED NOT DETECTED   Salmonella species NOT DETECTED NOT DETECTED   Serratia marcescens NOT DETECTED NOT DETECTED   Haemophilus influenzae NOT DETECTED NOT DETECTED   Neisseria meningitidis NOT DETECTED NOT DETECTED   Pseudomonas aeruginosa NOT DETECTED NOT DETECTED   Stenotrophomonas maltophilia NOT DETECTED NOT DETECTED   Candida albicans NOT DETECTED NOT DETECTED   Candida auris NOT DETECTED NOT DETECTED   Candida glabrata NOT DETECTED NOT DETECTED   Candida krusei NOT DETECTED NOT DETECTED   Candida parapsilosis NOT DETECTED NOT DETECTED   Candida tropicalis NOT DETECTED NOT DETECTED   Cryptococcus neoformans/gattii NOT DETECTED NOT DETECTED   Alfonse Spruce, PharmD PGY2 ID Pharmacy Resident [phone]  07/26/2020  8:32 AM

## 2020-07-26 NOTE — Progress Notes (Signed)
   07/26/20 1032  Assess: MEWS Score  Temp 99.4 F (37.4 C)  BP 110/65  Pulse Rate (!) 123  ECG Heart Rate (!) 128  Resp (!) 30  SpO2 94 %  O2 Device Room Air  Assess: MEWS Score  MEWS Temp 0  MEWS Systolic 0  MEWS Pulse 2  MEWS RR 2  MEWS LOC 0  MEWS Score 4  MEWS Score Color Red  Assess: if the MEWS score is Yellow or Red  Were vital signs taken at a resting state? Yes  Focused Assessment No change from prior assessment  Early Detection of Sepsis Score *See Row Information* High  MEWS guidelines implemented *See Row Information* Yes  Treat  MEWS Interventions Administered scheduled meds/treatments  Take Vital Signs  Increase Vital Sign Frequency  Red: Q 1hr X 4 then Q 4hr X 4, if remains red, continue Q 4hrs  Escalate  MEWS: Escalate Red: discuss with charge nurse/RN and provider, consider discussing with RRT  Notify: Charge Nurse/RN  Name of Charge Nurse/RN Notified Burr Soffer, RN  Date Charge Nurse/RN Notified 07/26/20  Time Charge Nurse/RN Notified 1053  Notify: Provider  Provider Name/Title Maryland Pink  Date Provider Notified 07/26/20  Time Provider Notified 1100  Notification Type Page  Notification Reason Other (Comment) (Red MEWS)  Response See new orders  Date of Provider Response 07/26/20  Time of Provider Response 1115  Document  Patient Outcome Other (Comment) (elevated HR on admit)  Progress note created (see row info) Yes

## 2020-07-26 NOTE — Progress Notes (Signed)
Pt's HR in 150s and has noted to get up to 240s. Kyere informed. Pt AOx4 and resting comfortably.

## 2020-07-26 NOTE — ED Notes (Signed)
Admitting doctor paged, forwarded to floor coverage.

## 2020-07-26 NOTE — Progress Notes (Signed)
[place] for Warfarin  Indication: atrial fibrillation  Allergies  Allergen Reactions  . Antihistamines, Diphenhydramine-Type Other (See Comments)    ALL ANTIHISTAMINES causes her heart to race  . Oxycodone-Acetaminophen Nausea Only    Patient Measurements:   Heparin Dosing Weight:   Vital Signs: BP: 97/64 (08/30 0700) Pulse Rate: 123 (08/30 0700)  Labs: Recent Labs    07/25/20 1346 07/25/20 1656 07/26/20 0546  HGB 16.4*  --   --   HCT 48.8*  --   --   PLT 180  --   --   APTT  --  45*  --   LABPROT  --  29.6* 38.7*  INR  --  2.9* 4.1*  CREATININE 1.10*  --   --     Estimated Creatinine Clearance: 38.4 mL/min (A) (by C-G formula based on SCr of 1.1 mg/dL (H)).   Medical History: Past Medical History:  Diagnosis Date  . A-fib ([place])    paroxysmal  . Aftercare for healing traumatic fracture of arm, unspecified   . H/O: hemorrhoidectomy   . H/O: hysterectomy   . Hyperlipidemia   . Hypertension   . Malignant melanoma of skin of trunk, except scrotum ([place])   . Other postprocedural status(V45.89)   . Rosacea   . S/P cholecystectomy   . Sciatica    right  . Sprain of lumbar region     Medications:  Scheduled:  . atorvastatin  20 mg Oral q1800  . Warfarin - Pharmacist Dosing Inpatient   Does not apply q1600    Assessment: Patient is a 30 yof that presents to the ED with N/V/D. The patient was found to be septic and started on broad spectrum antibiotics. The patient takes warfarin PTA for afib. Pharmacy has been asked to continue dosing warfarin while inpatient.   Warfarin PTA dose: 5mg  on Monday and 2.5 ROW   INR supratherapeutic this AM from 2.9 last night to 4.1, last dose 8/29 per pt report  Goal of Therapy:  INR 2-3 Monitor platelets by anticoagulation protocol: Yes   Plan:  Hold warfarin today Daily INR, s/s bleeding  Bertis Ruddy, PharmD Clinical Pharmacist ED Pharmacist Phone # [phone] 07/26/2020  7:46 AM

## 2020-07-26 NOTE — Progress Notes (Signed)
[facility] PROGRESS NOTE   [name] [name] DOB: 1931/05/04 DOA: 07/25/2020  PCP: Hoyt Koch, MD  Brief History/Interval Summary: 86 year old Caucasian female with a past medical history of atrial fibrillation on warfarin, chronic diastolic CHF, essential hypertension, hyperlipidemia and anxiety who comes in with left leg pain nausea vomiting and diarrhea.  She was found to be febrile.  Found to be tachycardic.  Noted to have erythema and swelling of the left flank.  Admitted for management of sepsis secondary to left lower extremity cellulitis.  Reason for Visit: Cellulitis of the left lower extremity with severe sepsis  Consultants: None  Procedures: None  Antibiotics: Anti-infectives (From admission, onward)   Start     Dose/Rate Route Frequency Ordered Stop   07/27/20 0600  cefTRIAXone (ROCEPHIN) 2 g in sodium chloride 0.9 % 100 mL IVPB        2 g 200 mL/hr over 30 Minutes Intravenous Every 24 hours 07/26/20 0842     07/26/20 1700  vancomycin (VANCOCIN) IVPB 1000 mg/200 mL premix  Status:  Discontinued        1,000 mg 200 mL/hr over 60 Minutes Intravenous Every 24 hours 07/25/20 1553 07/26/20 0842   07/26/20 0600  vancomycin (VANCOREADY) IVPB 750 mg/150 mL  Status:  Discontinued        750 mg 150 mL/hr over 60 Minutes Intravenous Every 12 hours 07/25/20 1551 07/25/20 1553   07/26/20 0430  ceFEPIme (MAXIPIME) 2 g in sodium chloride 0.9 % 100 mL IVPB  Status:  Discontinued        2 g 200 mL/hr over 30 Minutes Intravenous Every 12 hours 07/25/20 1551 07/26/20 0842   07/25/20 1600  vancomycin (VANCOREADY) IVPB 1500 mg/300 mL        1,500 mg 150 mL/hr over 120 Minutes Intravenous  Once 07/25/20 1549 07/25/20 1838   07/25/20 1545  ceFEPIme (MAXIPIME) 2 g in sodium chloride 0.9 % 100 mL IVPB        2 g 200 mL/hr over 30 Minutes Intravenous  Once 07/25/20 1531 07/25/20 1622   07/25/20 1545  metroNIDAZOLE (FLAGYL) IVPB 500 mg        500 mg 100 mL/hr  over 60 Minutes Intravenous  Once 07/25/20 1531 07/25/20 1729   07/25/20 1545  vancomycin (VANCOCIN) IVPB 1000 mg/200 mL premix  Status:  Discontinued        1,000 mg 200 mL/hr over 60 Minutes Intravenous  Once 07/25/20 1531 07/25/20 1549      Subjective/Interval History: Patient complains of pain in the left leg both better compared to last night.  Swelling is also improved.  Denies any chest pain.  No dizziness or lightheadedness.  Seems anxious.  Her daughter is at the bedside.  Has been having diarrhea as well.    Assessment/Plan:  Severe sepsis with septic shock secondary to left lower extremity cellulitis/bacteremia Patient had lactic acid level of 6.7.  Blood pressure borderline low.  She was tachycardic.  Noted to be febrile.  WBC however was normal.  Patient was aggressively hydrated with improvement in lactic acid to 3.8 this morning.  We will recheck.  Hold her antihypertensives for now.  Patient initially started on vancomycin and cefepime along with Flagyl.  Blood cultures growing strep species.  Okay to change her over to ceftriaxone.  No DVT noted on Doppler studies.  Mild acute kidney injury Creatinine noted to be mildly elevated at 1.1.  Will recheck labs.  Monitor urine output.  Atrial fibrillation  with RVR Known history of A. fib.  Uses metoprolol at home.  Presented initially with a heart rate of 160.  Improved after Lopressor was given.  INR noted to be supratherapeutic.  Warfarin per pharmacy.  Resume metoprolol once blood pressure stabilizes.  Right upper lobe lung nodule Incidental finding.  Outpatient monitoring.  Chronic diastolic CHF Continue to monitor while she is being aggressively hydrated.  Hyperlipidemia Continue statin.  Recent eye surgery Supposed to be on eyedrops.  Family to get the eyedrops so that we can resume.   DVT Prophylaxis: On warfarin Code Status: Full code Family Communication: Discussed with the patient's daughter Disposition  Plan: Hopefully return home when improved.  Will need PT and OT.  Status is: Inpatient  Remains inpatient appropriate because:IV treatments appropriate due to intensity of illness or inability to take PO and Inpatient level of care appropriate due to severity of illness   Dispo: The patient is from: Home              Anticipated d/c is to: Home              Anticipated d/c date is: 3 days              Patient currently is not medically stable to d/c.      Medications:  Scheduled: . atorvastatin  20 mg Oral q1800  . Warfarin - Pharmacist Dosing Inpatient   Does not apply q1600   Continuous: . [START ON 07/27/2020] cefTRIAXone (ROCEPHIN)  IV    . lactated ringers Stopped (07/26/20 1035)   IFO:YDXAJOINOMVEH, ALPRAZolam, iohexol, ondansetron (ZOFRAN) IV   Objective:  Vital Signs  Vitals:   07/26/20 0915 07/26/20 0930 07/26/20 0945 07/26/20 1000  BP: 94/70 98/62 97/60  91/66  Pulse: (!) 48 (!) 120 (!) 116 (!) 115  Resp: (!) 45 (!) 37 (!) 26 (!) 31  Temp:      TempSrc:      SpO2: 94% 93% 93% 93%    Intake/Output Summary (Last 24 hours) at 07/26/2020 1031 Last data filed at 07/26/2020 0615 Gross per 24 hour  Intake 2000 ml  Output --  Net 2000 ml   There were no vitals filed for this visit.  General appearance: Awake alert.  In no distress Resp: Clear to auscultation bilaterally.  Normal effort Cardio: S1-S2 is normal regular.  No S3-S4.  No rubs murmurs or bruit GI: Abdomen is soft.  Nontender nondistended.  Bowel sounds are present normal.  No masses organomegaly Extremities: Erythema noted to the left leg with swelling.  Tenderness to palpation.  No fluctuant areas noted. Neurologic: Alert and oriented x3.  No focal neurological deficits.    Lab Results:  Data Reviewed: I have personally reviewed following labs and imaging studies  CBC: Recent Labs  Lab 07/25/20 1346  WBC 5.3  NEUTROABS 3.8  HGB 16.4*  HCT 48.8*  MCV 91.2  PLT 209    Basic Metabolic  Panel: Recent Labs  Lab 07/25/20 1346  NA 134*  K 3.6  CL 99  CO2 20*  GLUCOSE 183*  BUN 19  CREATININE 1.10*  CALCIUM 9.5    GFR: Estimated Creatinine Clearance: 38.4 mL/min (A) (by C-G formula based on SCr of 1.1 mg/dL (H)).  Liver Function Tests: Recent Labs  Lab 07/25/20 1346  AST 51*  ALT 31  ALKPHOS 88  BILITOT QUANTITY NOT SUFFICIENT, UNABLE TO PERFORM TEST  PROT 7.0  ALBUMIN 3.4*     Coagulation Profile: Recent Labs  Lab 07/25/20 1656 07/26/20 0546  INR 2.9* 4.1*      Recent Results (from the past 240 hour(s))  Blood Culture (routine x 2)     Status: None (Preliminary result)   Collection Time: 07/25/20  3:30 PM   Specimen: BLOOD RIGHT HAND  Result Value Ref Range Status   Specimen Description BLOOD RIGHT HAND  Final   Special Requests   Final    BOTTLES DRAWN AEROBIC ONLY Blood Culture results may not be optimal due to an inadequate volume of blood received in culture bottles   Culture  Setup Time   Final    GRAM POSITIVE COCCI IN CHAINS AEROBIC BOTTLE ONLY CRITICAL RESULT CALLED TO, READ BACK BY AND VERIFIED WITH: A. Rogers Blocker PharmD 8:30 07/26/20 ([email or web address]) Performed at [facility], [place] [address] [place], [place] [address]    Culture GRAM POSITIVE COCCI  Final   Report Status PENDING  Incomplete  Blood Culture ID Panel (Reflexed)     Status: Abnormal   Collection Time: 07/25/20  3:30 PM  Result Value Ref Range Status   Enterococcus faecalis NOT DETECTED NOT DETECTED Final   Enterococcus Faecium NOT DETECTED NOT DETECTED Final   Listeria monocytogenes NOT DETECTED NOT DETECTED Final   Staphylococcus species NOT DETECTED NOT DETECTED Final   Staphylococcus aureus (BCID) NOT DETECTED NOT DETECTED Final   Staphylococcus epidermidis NOT DETECTED NOT DETECTED Final   Staphylococcus lugdunensis NOT DETECTED NOT DETECTED Final   Streptococcus species DETECTED (A) NOT DETECTED Final    Comment: Not Enterococcus species, Streptococcus agalactiae,  Streptococcus pyogenes, or Streptococcus pneumoniae. CRITICAL RESULT CALLED TO, READ BACK BY AND VERIFIED WITH: A. Rogers Blocker PharmD 8:30 07/26/20 ([email or web address])    Streptococcus agalactiae NOT DETECTED NOT DETECTED Final   Streptococcus pneumoniae NOT DETECTED NOT DETECTED Final   Streptococcus pyogenes NOT DETECTED NOT DETECTED Final   A.calcoaceticus-baumannii NOT DETECTED NOT DETECTED Final   Bacteroides fragilis NOT DETECTED NOT DETECTED Final   Enterobacterales NOT DETECTED NOT DETECTED Final   Enterobacter cloacae complex NOT DETECTED NOT DETECTED Final   Escherichia coli NOT DETECTED NOT DETECTED Final   Klebsiella aerogenes NOT DETECTED NOT DETECTED Final   Klebsiella oxytoca NOT DETECTED NOT DETECTED Final   Klebsiella pneumoniae NOT DETECTED NOT DETECTED Final   Proteus species NOT DETECTED NOT DETECTED Final   Salmonella species NOT DETECTED NOT DETECTED Final   Serratia marcescens NOT DETECTED NOT DETECTED Final   Haemophilus influenzae NOT DETECTED NOT DETECTED Final   Neisseria meningitidis NOT DETECTED NOT DETECTED Final   Pseudomonas aeruginosa NOT DETECTED NOT DETECTED Final   Stenotrophomonas maltophilia NOT DETECTED NOT DETECTED Final   Candida albicans NOT DETECTED NOT DETECTED Final   Candida auris NOT DETECTED NOT DETECTED Final   Candida glabrata NOT DETECTED NOT DETECTED Final   Candida krusei NOT DETECTED NOT DETECTED Final   Candida parapsilosis NOT DETECTED NOT DETECTED Final   Candida tropicalis NOT DETECTED NOT DETECTED Final   Cryptococcus neoformans/gattii NOT DETECTED NOT DETECTED Final    Comment: Performed at [facility], [address], [place], [place] [address]  SARS Coronavirus 2 by RT PCR (hospital order, performed in [facility] hospital lab) Nasopharyngeal Nasopharyngeal Swab     Status: None   Collection Time: 07/25/20  3:33 PM   Specimen: Nasopharyngeal Swab  Result Value Ref Range Status   SARS Coronavirus 2 NEGATIVE NEGATIVE Final     Comment: (NOTE) SARS-CoV-2 target nucleic acids are NOT DETECTED.  The SARS-CoV-2  RNA is generally detectable in upper and lower respiratory specimens during the acute phase of infection. The lowest concentration of SARS-CoV-2 viral copies this assay can detect is 250 copies / mL. A negative result does not preclude SARS-CoV-2 infection and should not be used as the sole basis for treatment or other patient management decisions.  A negative result may occur with improper specimen collection / handling, submission of specimen other than nasopharyngeal swab, presence of viral mutation(s) within the areas targeted by this assay, and inadequate number of viral copies (<250 copies / mL). A negative result must be combined with clinical observations, patient history, and epidemiological information.  Fact Sheet for Patients:   StrictlyIdeas.no  Fact Sheet for Healthcare Providers: BankingDealers.co.za  This test is not yet approved or  cleared by the [place] FDA and has been authorized for detection and/or diagnosis of SARS-CoV-2 by FDA under an Emergency Use Authorization (EUA).  This EUA will remain in effect (meaning this test can be used) for the duration of the COVID-19 declaration under Section 564(b)(1) of the Act, 21 U.S.C. section 360bbb-3(b)(1), unless the authorization is terminated or revoked sooner.  Performed at [facility], [place] [address], [place], [place] [address]       Radiology Studies: CT Angio Chest PE W/Cm &/Or Wo Cm  Result Date: 07/25/2020 CLINICAL DATA:  PE suspected, high prob Left leg swelling and pain. EXAM: CT ANGIOGRAPHY CHEST WITH CONTRAST TECHNIQUE: Multidetector CT imaging of the chest was performed using the standard protocol during bolus administration of intravenous contrast. Multiplanar CT image reconstructions and MIPs were obtained to evaluate the vascular anatomy. Performed in  conjunction with CT of the abdomen/pelvis, reported separately. CONTRAST:  48mL OMNIPAQUE IOHEXOL 350 MG/ML SOLN COMPARISON:  Radiograph earlier today. FINDINGS: Cardiovascular: There are no filling defects within the pulmonary arteries to suggest pulmonary embolus. Atherosclerosis of the thoracic aorta without aneurysm or dissection. Aorta is tortuous. Mild cardiomegaly, with particular left atrial enlargement. Minimal pericardial fluid without significant effusion. Coronary artery calcifications. Mediastinum/Nodes: Small right hilar nodes are subcentimeter and not enlarged by size criteria. There is no mediastinal or left hilar adenopathy. Patulous mid and upper esophagus. No esophageal wall thickening. No suspicious thyroid nodule. Lungs/Pleura: Diffusely heterogeneous pulmonary parenchyma. Sub solid nodular opacity in the right upper lobe measures 12 mm, series 4, image 51. Irregular subpleural opacities involving the right greater than left lung apex are typical of pleuroparenchymal scarring, however appears slightly nodular on the right. No septal thickening or findings of pulmonary edema. No pleural effusion. Scattered atelectasis in both dependent lower lobes. Excretory phase imaging limits bronchial assessment. Upper Abdomen: Assessed on concurrent abdominal CT, reported separately. Musculoskeletal: Exaggerated upper thoracic kyphosis. Multilevel degenerative change in the spine. There are no acute or suspicious osseous abnormalities. Surgical hardware in the left proximal humerus. Review of the MIP images confirms the above findings. IMPRESSION: 1. No pulmonary embolus. 2. Diffusely heterogeneous pulmonary parenchyma, can be seen with small vessel or small airways disease. 3. Sub solid ground-glass nodular opacity in the right upper lobe measures 12 mm. This is nonspecific, possibly infectious/inflammatory. Follow-up non-contrast CT recommended at 3-6 months to confirm persistence. If unchanged, and solid  component remains <6 mm, annual CT is recommended until 5 years of stability has been established. If persistent these nodules should be considered highly suspicious if the solid component of the nodule is 6 mm or greater in size and enlarging. This recommendation follows the consensus statement: Guidelines for Management of Incidental Pulmonary  Nodules Detected on CT Images: From the Fleischner Society 2017; Radiology 2017; 284:228-243. 4. Irregular subpleural opacities involving the right greater than left lung apex are typical of pleuroparenchymal scarring, however appears slightly nodular on the right. Recommend attention to this at follow-up. 5. Mild cardiomegaly with particular left atrial enlargement. Coronary artery calcifications. Aortic Atherosclerosis (ICD10-I70.0). Electronically Signed   By: Keith Rake M.D.   On: 07/25/2020 18:27   CT Abdomen Pelvis W Contrast  Result Date: 07/25/2020 CLINICAL DATA:  Acute nonlocalized abdominal pain. Left leg swelling and pain. EXAM: CT ABDOMEN AND PELVIS WITH CONTRAST TECHNIQUE: Multidetector CT imaging of the abdomen and pelvis was performed using the standard protocol following bolus administration of intravenous contrast. CONTRAST:  82mL OMNIPAQUE IOHEXOL 350 MG/ML SOLN COMPARISON:  No prior abdominal imaging. Concurrent chest CT reviewed. FINDINGS: Lower chest: Assessed on concurrent chest CT. Cardiomegaly. Coronary artery calcifications. Hepatobiliary: Post cholecystectomy. Biliary prominence is likely normal post cholecystectomy. Common bile duct measures 8 mm at the porta hepatis. There is a coarse calcification involving the anterior subcapsular liver, partially obscured by breathing motion artifact. No underlying lesion is seen on delayed phase. No suspicious focal hepatic lesion. Pancreas: Parenchymal atrophy. No ductal dilatation or inflammation. Spleen: Normal in size without focal abnormality. Adrenals/Urinary Tract: No adrenal nodule. No  hydronephrosis or perinephric edema. Homogeneous renal enhancement with symmetric excretion on delayed phase imaging. Possible nonobstructing stone in the lower left kidney versus early excretion of IV contrast. Urinary bladder is distended without wall thickening. Stomach/Bowel: Nondistended stomach. There is no small bowel obstruction or inflammatory change. High-riding cecum in the right upper quadrant. Normal appendix. Interposition of the colon anterior to the liver. Diffuse and multifocal colonic diverticulosis. Sigmoid colon is tortuous. No evidence of diverticulitis. Stool distends the rectum with possible rectocele. Vascular/Lymphatic: There prominent mildly enlarged lymph nodes in the left inguinal region measuring up to 14 mm. Prominent left external iliac nodes measure up to 9 mm. There is fat stranding adjacent to the external iliac and common femoral vessels. No discrete intraluminal filling defect in the venous structures. Arterial vascular calcifications without dissection, or acute arterial abnormality. Additional prominent left retroperitoneal nodes measure up to 8 mm the level of the iliac bifurcation. Aortic atherosclerosis and tortuosity. No aortic aneurysm. Patent portal vein. Reproductive: Status post hysterectomy. No adnexal masses. Left ovary tentatively visualized and quiescent. Right ovary not definitively seen. Other: No ascites or free fluid. No free air. No abdominopelvic abscess. There is fat stranding involving the left anterior aspect of the thigh which tracks along the iliac vessels into the retroperitoneum. No soft tissue air. Musculoskeletal: Mild to moderate compression fractures of L3 and L4 as well as mild L5 compression fracture likely chronic. Bones are diffusely under mineralized. Diffuse multilevel degenerative change in the spine. No evidence of left hip joint effusion or bony destruction. Fat stranding involving the subcutaneous tissues of the left inguinal region and  anterior upper thigh without CT evidence of muscular involvement. IMPRESSION: 1. Fat stranding involving the left anterior aspect of the upper thigh which tracks along the iliac vessels into the retroperitoneum. No soft tissue air, focal fluid collection, or deep muscle involvement. There are also prominent left inguinal and external iliac nodes. Suspect left lower extremity cellulitis with reactive adenopathy, however clinical correlation is needed. 2. No lower extremity DVT or acute vascular findings. 3. Colonic diverticulosis without diverticulitis. Stool distends the rectum with possible rectocele. 4. Compression fractures of L3, L4, and L5 are likely chronic. Aortic Atherosclerosis (ICD10-I70.0).  Electronically Signed   By: Keith Rake M.D.   On: 07/25/2020 18:36   DG Chest Port 1 View  Result Date: 07/25/2020 CLINICAL DATA:  Questionable sepsis - evaluate for abnormality Chest pain.  Leg edema. EXAM: PORTABLE CHEST 1 VIEW COMPARISON:  Chest radiograph 09/29/2017 FINDINGS: Patient is rotated. The chin obscures the left lung apex. Left costophrenic angle is not included in the field of view. Mild cardiomegaly. Aortic atherosclerosis. Questionable ill-defined left basilar opacity versus overlapping soft tissue structures. Mild vascular congestion no pneumothorax. Surgical hardware in the left proximal humerus. IMPRESSION: 1. Mild cardiomegaly and vascular congestion. 2. Questionable ill-defined left basilar opacity versus overlapping soft tissue structures. 3. Limited exam due to patient's chin obscuring the apices, rotation, and left costophrenic angle excluded from the field of view. PA and lateral views recommended when patient is able. Electronically Signed   By: Keith Rake M.D.   On: 07/25/2020 15:58   VAS [place] LOWER EXTREMITY VENOUS (DVT) ([facility] and [facility] 7a-7p)  Result Date: 07/25/2020  Lower Venous DVT Study Indications: Pain, and Swelling. Sepsis.  Risk Factors: Atrial fibrillation,  therapeutic on Coumadin. Anticoagulation: Coumadin. Limitations: Patient's pain with touch and RN turned lights on in room in order to draw blood and start IV. Comparison Study: No prior study on file Performing Technologist: Sharion Dove RVS  Examination Guidelines: A complete evaluation includes B-mode imaging, spectral Doppler, color Doppler, and power Doppler as needed of all accessible portions of each vessel. Bilateral testing is considered an integral part of a complete examination. Limited examinations for reoccurring indications may be performed as noted. The reflux portion of the exam is performed with the patient in reverse Trendelenburg.  +-----+---------------+---------+-----------+----------+----------------------+ RIGHTCompressibilityPhasicitySpontaneityPropertiesThrombus Aging         +-----+---------------+---------+-----------+----------+----------------------+ CFV                                               patent by color and                                                      Doppler                +-----+---------------+---------+-----------+----------+----------------------+   +---------+---------------+---------+-----------+----------+-------------------+ LEFT     CompressibilityPhasicitySpontaneityPropertiesThrombus Aging      +---------+---------------+---------+-----------+----------+-------------------+ CFV      Full           Yes      Yes                                      +---------+---------------+---------+-----------+----------+-------------------+ SFJ      Full                                                             +---------+---------------+---------+-----------+----------+-------------------+ FV Prox  patent by color and                                                       Doppler              +---------+---------------+---------+-----------+----------+-------------------+ FV Mid                                                patent by color and                                                       Doppler             +---------+---------------+---------+-----------+----------+-------------------+ FV Distal                                             patent by color and                                                       Doppler             +---------+---------------+---------+-----------+----------+-------------------+ POP                                                   patent by color and                                                       Doppler             +---------+---------------+---------+-----------+----------+-------------------+ PTV      Full                                                             +---------+---------------+---------+-----------+----------+-------------------+ PERO     Full                                                             +---------+---------------+---------+-----------+----------+-------------------+     Summary: RIGHT: - No evidence of common femoral vein obstruction. pulsatile waveforms  LEFT: - There is no evidence of deep vein thrombosis in the lower  extremity. However, portions of this examination were limited- see technologist comments above.  Pulsatile waveforms - Ultrasound characteristics of enlarged lymph nodes noted in the groin.  *See table(s) above for measurements and observations. Electronically signed by Servando Snare MD on 07/25/2020 at 7:41:09 PM.    Final        LOS: 1 day   [facility] Pager on [email or web address]  07/26/2020, 10:31 AM

## 2020-07-27 ENCOUNTER — Inpatient Hospital Stay (HOSPITAL_COMMUNITY): Payer: Medicare Other

## 2020-07-27 DIAGNOSIS — R0902 Hypoxemia: Secondary | ICD-10-CM

## 2020-07-27 LAB — CBC
HCT: 38 % (ref 36.0–46.0)
Hemoglobin: 12.7 g/dL (ref 12.0–15.0)
MCH: 30.7 pg (ref 26.0–34.0)
MCHC: 33.4 g/dL (ref 30.0–36.0)
MCV: 91.8 fL (ref 80.0–100.0)
Platelets: 145 10*3/uL — ABNORMAL LOW (ref 150–400)
RBC: 4.14 MIL/uL (ref 3.87–5.11)
RDW: 13.5 % (ref 11.5–15.5)
WBC: 8.3 10*3/uL (ref 4.0–10.5)
nRBC: 0 % (ref 0.0–0.2)

## 2020-07-27 LAB — BASIC METABOLIC PANEL
Anion gap: 10 (ref 5–15)
BUN: 20 mg/dL (ref 8–23)
CO2: 22 mmol/L (ref 22–32)
Calcium: 8.6 mg/dL — ABNORMAL LOW (ref 8.9–10.3)
Chloride: 104 mmol/L (ref 98–111)
Creatinine, Ser: 0.74 mg/dL (ref 0.44–1.00)
GFR calc Af Amer: >60 mL/min (ref >60)
GFR calc non Af Amer: >60 mL/min (ref >60)
Glucose, Bld: 128 mg/dL — ABNORMAL HIGH (ref 70–99)
Potassium: 3.6 mmol/L (ref 3.5–5.1)
Sodium: 136 mmol/L (ref 135–145)

## 2020-07-27 LAB — MAGNESIUM: Magnesium: 1.6 mg/dL — ABNORMAL LOW (ref 1.7–2.4)

## 2020-07-27 LAB — PROTIME-INR
INR: 3.7 — ABNORMAL HIGH (ref 0.8–1.2)
Prothrombin Time: 35.2 seconds — ABNORMAL HIGH (ref 11.4–15.2)

## 2020-07-27 MED ORDER — METOPROLOL TARTRATE 5 MG/5ML IV SOLN
2.5000 mg | Freq: Once | INTRAVENOUS | Status: AC
  Administered 2020-07-27: 2.5 mg via INTRAVENOUS
  Filled 2020-07-27: qty 5

## 2020-07-27 MED ORDER — FUROSEMIDE 10 MG/ML IJ SOLN
20.0000 mg | Freq: Once | INTRAMUSCULAR | Status: AC
  Administered 2020-07-27: 20 mg via INTRAVENOUS
  Filled 2020-07-27: qty 2

## 2020-07-27 MED ORDER — MAGNESIUM SULFATE 2 GM/50ML IV SOLN
2.0000 g | Freq: Once | INTRAVENOUS | Status: AC
  Administered 2020-07-27: 2 g via INTRAVENOUS
  Filled 2020-07-27: qty 50

## 2020-07-27 MED ORDER — METOPROLOL TARTRATE 25 MG PO TABS
25.0000 mg | ORAL_TABLET | Freq: Two times a day (BID) | ORAL | Status: DC
  Administered 2020-07-27 – 2020-07-28 (×3): 25 mg via ORAL
  Filled 2020-07-27 (×3): qty 1

## 2020-07-27 MED ORDER — POTASSIUM CHLORIDE CRYS ER 20 MEQ PO TBCR
40.0000 meq | EXTENDED_RELEASE_TABLET | Freq: Once | ORAL | Status: AC
  Administered 2020-07-27: 40 meq via ORAL
  Filled 2020-07-27: qty 2

## 2020-07-27 NOTE — Evaluation (Signed)
Occupational Therapy Evaluation Patient Details Name: [name] [name] MRN: [id number] DOB: [birth year] Today's Date: 07/27/2020    History of Present Illness 86 year old female with a past medical history of atrial fibrillation on warfarin, chronic diastolic CHF, essential hypertension, hyperlipidemia and anxiety who comes in with left leg pain nausea vomiting and diarrhea.  She was found to be febrile.  Found to be tachycardic.  Noted to have erythema and swelling of the left flank.  Admitted for management of sepsis secondary to left lower extremity cellulitis.   Clinical Impression   PTA, pt lives alone and reports Modified Independence with all ADLs, IADLs and mobility using cane. Pt presents now with diagnoses above and with pain as greatest limiting factor. Pt pleasant and motivated to attempt tasks, but ultimately required increased time for all movements and Total A for bed mobility due to pain. Unable to attempt standing today due to pain. Pt requires Total A for LB ADLs bed level due to deficits. Daughter present and attentive to all pt needs, assisting in maximizing pt comfort in bed. HR up to 138bpm during session. Recommend SNF for ST rehab due to inability to safely complete ADLs and transfers without extensive assist.     Follow Up Recommendations  SNF;Supervision/Assistance - 24 hour    Equipment Recommendations  Tub/shower seat    Recommendations for Other Services       Precautions / Restrictions Precautions Precautions: Fall Precaution Comments: L LE pain Restrictions Weight Bearing Restrictions: No      Mobility Bed Mobility Overal bed mobility: Needs Assistance Bed Mobility: Supine to Sit;Sit to Supine     Supine to sit: Total assist;HOB elevated Sit to supine: Total assist;HOB elevated   General bed mobility comments: Overall Total A for bed mobility, pt attempting to assist by advancing R LE and reaching for bed rail but increased time required and effort  due to L LE pain  Transfers                 General transfer comment: unable     Balance Overall balance assessment: Needs assistance Sitting-balance support: Bilateral upper extremity supported;Feet supported Sitting balance-Leahy Scale: Fair         Standing balance comment: unable                           ADL either performed or assessed with clinical judgement   ADL Overall ADL's : Needs assistance/impaired Eating/Feeding: Set up;Sitting   Grooming: Set up;Sitting   Upper Body Bathing: Sitting;Minimal assistance   Lower Body Bathing: Total assistance;Bed level   Upper Body Dressing : Minimal assistance;Sitting   Lower Body Dressing: Total assistance;Bed level Lower Body Dressing Details (indicate cue type and reason): Total A to don socks, difficulty donning L sock due to pain     Toileting- Clothing Manipulation and Hygiene: Total assistance;Bed level         General ADL Comments: Pt with deficits in strength and endurance though greatly limited by pain in L LE (with touch and movement) requiring extensive assist for ADLs/transfers     Vision Baseline Vision/History: Wears glasses Wears Glasses: Reading only Patient Visual Report: No change from baseline Vision Assessment?: No apparent visual deficits     Perception     Praxis      Pertinent Vitals/Pain Pain Assessment: Faces Faces Pain Scale: Hurts whole lot Pain Location: L LE with movement/touch, pt also with headache Pain Descriptors / Indicators: Burning;Aching;Discomfort;Grimacing;Guarding;Headache Pain  Intervention(s): Limited activity within patient's tolerance;Monitored during session;Repositioned;Patient requesting pain meds-RN notified     Hand Dominance Right   Extremity/Trunk Assessment Upper Extremity Assessment Upper Extremity Assessment: Generalized weakness   Lower Extremity Assessment Lower Extremity Assessment: Defer to PT evaluation   Cervical / Trunk  Assessment Cervical / Trunk Assessment: Kyphotic   Communication Communication Communication: No difficulties   Cognition Arousal/Alertness: Awake/alert Behavior During Therapy: WFL for tasks assessed/performed Overall Cognitive Status: Within Functional Limits for tasks assessed                                     General Comments  Pt on 2 L O2, SPO2 WFL. HR bouncing between 110-138bpm. Episodes of tachycardia last night. Daughter present during session and attentive to pt needs and assistance    Exercises     Shoulder Instructions      Home Living Family/patient expects to be discharged to:: Private residence Living Arrangements: Alone   Type of Home: House Home Access: Stairs to enter [employer] of Steps: 6 Entrance Stairs-Rails: Right;Left Home Layout: One level     Bathroom Shower/Tub: Teacher, early years/pre: [place]: Tub bench;Cane - single point;Walker - 2 wheels   Additional Comments: Reports tub bench was her husbands and has difficulty using - would like regular shower chair      Prior Functioning/Environment Level of Independence: Independent with assistive device(s)        Comments: Uses cane inside and outside the home. Pt reports Modified Independence with ADLs, IADLs in the home. Pt was driving and grocery shopping.         OT Problem List: Decreased strength;Decreased activity tolerance;Impaired balance (sitting and/or standing);Cardiopulmonary status limiting activity;Pain      OT Treatment/Interventions: Self-care/ADL training;Therapeutic exercise;Energy conservation;DME and/or AE instruction;Therapeutic activities;Patient/family education    OT Goals(Current goals can be found in the care plan section) Acute Rehab OT Goals Patient Stated Goal: decrease pain, be able to do more OT Goal Formulation: With patient/family Time For Goal Achievement: 08/10/20 Potential to Achieve Goals:  Good ADL Goals Pt Will Perform Lower Body Bathing: with mod assist;sit to/from stand;sitting/lateral leans Pt Will Perform Lower Body Dressing: with mod assist;sit to/from stand;sitting/lateral leans Additional ADL Goal #1: Pt to demonstrate ability to complete sit to stand transfer at Mod A in prep for ADL transfers  OT Frequency: Min 2X/week   Barriers to D/C:            Co-evaluation              AM-PAC OT "6 Clicks" Daily Activity     Outcome Measure Help from another person eating meals?: A Little Help from another person taking care of personal grooming?: A Little Help from another person toileting, which includes using toliet, bedpan, or urinal?: Total Help from another person bathing (including washing, rinsing, drying)?: Total Help from another person to put on and taking off regular upper body clothing?: A Little Help from another person to put on and taking off regular lower body clothing?: Total 6 Click Score: 12   End of Session Equipment Utilized During Treatment: Oxygen Nurse Communication: Mobility status;Patient requests pain meds  Activity Tolerance: Patient limited by pain Patient left: in bed;with call bell/phone within reach;with family/visitor present  OT Visit Diagnosis: Unsteadiness on feet (R26.81);Other abnormalities of gait and mobility (R26.89);Muscle weakness (generalized) (M62.81);History of falling (Z91.81);Pain  Pain - Right/Left: Left Pain - part of body: Leg                Time: 8864-8472 OT Time Calculation (min): 41 min Charges:  OT General Charges $OT Visit: 1 Visit OT Evaluation $OT Eval Moderate Complexity: 1 Mod OT Treatments $Self Care/Home Management : 8-22 mins $Therapeutic Activity: 8-22 mins  Layla Maw, OTR/L  Layla Maw 07/27/2020, 9:28 AM

## 2020-07-27 NOTE — Progress Notes (Signed)
Pt's daughter let up by security w/o [facility], chrg nurse, or care nurse approval. Pt AOx4 and iin no distress during this shift. [employer] not followed prior to visitor coming to unit.

## 2020-07-27 NOTE — Progress Notes (Addendum)
[facility] PROGRESS NOTE   [name] [name] [facility] FMB:[id number] DOB: [birth year] DOA: 07/25/2020  PCP: Hoyt Koch, MD  Brief History/Interval Summary: 86 year old Caucasian female with a past medical history of atrial fibrillation on warfarin, chronic diastolic CHF, essential hypertension, hyperlipidemia and anxiety who comes in with left leg pain nausea vomiting and diarrhea.  She was found to be febrile.  Found to be tachycardic.  Noted to have erythema and swelling of the left flank.  Admitted for management of sepsis secondary to left lower extremity cellulitis.  Reason for Visit: Cellulitis of the left lower extremity with severe sepsis  Consultants: None  Procedures: None  Antibiotics: Anti-infectives (From admission, onward)   Start     Dose/Rate Route Frequency Ordered Stop   07/27/20 0600  cefTRIAXone (ROCEPHIN) 2 g in sodium chloride 0.9 % 100 mL IVPB  Status:  Discontinued        2 g 200 mL/hr over 30 Minutes Intravenous Every 24 hours 07/26/20 0842 07/26/20 1046   07/26/20 1800  cefTRIAXone (ROCEPHIN) 2 g in sodium chloride 0.9 % 100 mL IVPB        2 g 200 mL/hr over 30 Minutes Intravenous Every 24 hours 07/26/20 1046     07/26/20 1700  vancomycin (VANCOCIN) IVPB 1000 mg/200 mL premix  Status:  Discontinued        1,000 mg 200 mL/hr over 60 Minutes Intravenous Every 24 hours 07/25/20 1553 07/26/20 0842   07/26/20 0600  vancomycin (VANCOREADY) IVPB 750 mg/150 mL  Status:  Discontinued        750 mg 150 mL/hr over 60 Minutes Intravenous Every 12 hours 07/25/20 1551 07/25/20 1553   07/26/20 0430  ceFEPIme (MAXIPIME) 2 g in sodium chloride 0.9 % 100 mL IVPB  Status:  Discontinued        2 g 200 mL/hr over 30 Minutes Intravenous Every 12 hours 07/25/20 1551 07/26/20 0842   07/25/20 1600  vancomycin (VANCOREADY) IVPB 1500 mg/300 mL        1,500 mg 150 mL/hr over 120 Minutes Intravenous  Once 07/25/20 1549 07/25/20 1838   07/25/20 1545  ceFEPIme (MAXIPIME) 2 g in  sodium chloride 0.9 % 100 mL IVPB        2 g 200 mL/hr over 30 Minutes Intravenous  Once 07/25/20 1531 07/25/20 1622   07/25/20 1545  metroNIDAZOLE (FLAGYL) IVPB 500 mg        500 mg 100 mL/hr over 60 Minutes Intravenous  Once 07/25/20 1531 07/25/20 1729   07/25/20 1545  vancomycin (VANCOCIN) IVPB 1000 mg/200 mL premix  Status:  Discontinued        1,000 mg 200 mL/hr over 60 Minutes Intravenous  Once 07/25/20 1531 07/25/20 1549      Subjective/Interval History: Overnight events noted.  Patient developed tachycardia requiring rapid response evaluation.  Improved after she was given IV metoprolol.  Patient states that she is feeling about the same as yesterday.  Noted to be on oxygen this morning.  Does admit to some shortness of breath.  Her daughter is at the bedside.  No nausea or vomiting.    Assessment/Plan:  Severe sepsis with septic shock secondary to left lower extremity cellulitis/bacteremia Patient had lactic acid level of 6.7.  Blood pressure borderline low.  She was tachycardic.  Noted to be febrile.  WBC however was normal.  Patient was aggressively hydrated with improvement in lactic acid. Patient initially started on vancomycin cefepime and Flagyl.  Blood cultures growing strep species.  Changed over to ceftriaxone yesterday.  No DVT noted on Doppler studies.  Left lower extremity seems to be improving gradually.   Hypoxia Noted to be on 2 L of oxygen this morning saturating in the mid 90s.  Crackles heard in the lung.  Proceed with chest x-ray.  Stop IV fluids.  Mild acute kidney injury Creatinine was mildly elevated at 1.1.  Noted to be normal this morning.  Monitor urine output.    Hypokalemia and hypomagnesemia Potassium is better.  Magnesium will be repleted.  Paroxysmal Atrial fibrillation with RVR Known history of A. fib.  Uses metoprolol at home.  Presented initially with a heart rate of 160.  Improved after Lopressor was given.  INR noted to be supratherapeutic.   Warfarin per pharmacy.   Since blood pressure is now stable will resume metoprolol orally.  Continue with IV metoprolol as needed for elevated heart rate.  Overnight events noted.  Patient and family reassured.    Right upper lobe lung nodule Incidental finding.  Outpatient monitoring.  Chronic diastolic CHF Was initially hypovolemic.  Now noted to be on oxygen with few crackles in the lungs.  Chest x-ray is pending.  We will stop IV fluids.  May need to give her Lasix.  Hyperlipidemia Continue statin.  Recent eye surgery Supposed to be on eyedrops.  Family to get the eyedrops so that we can resume.   DVT Prophylaxis: On warfarin Code Status: Full code Family Communication: Discussed with the patient's daughter Disposition Plan: Hopefully return home when improved.   PT and OT.  Status is: Inpatient  Remains inpatient appropriate because:IV treatments appropriate due to intensity of illness or inability to take PO and Inpatient level of care appropriate due to severity of illness   Dispo: The patient is from: Home              Anticipated d/c is to: Home              Anticipated d/c date is: 3 days              Patient currently is not medically stable to d/c.      Medications:  Scheduled: . atorvastatin  20 mg Oral q1800  . metoprolol tartrate      . metoprolol tartrate  25 mg Oral BID  . Warfarin - Pharmacist Dosing Inpatient   Does not apply q1600   Continuous: . cefTRIAXone (ROCEPHIN)  IV 2 g (07/26/20 1723)   ACZ:YSAYTKZSWFUXN, ALPRAZolam, iohexol, ondansetron (ZOFRAN) IV   Objective:  Vital Signs  Vitals:   07/27/20 0432 07/27/20 0623 07/27/20 0811 07/27/20 0821  BP: 110/64 104/69 121/78 121/78  Pulse: (!) 118  (!) 57 (!) 127  Resp: (!) 32  (!) 24 (!) 25  Temp: 98 F (36.7 C)  100.3 F (37.9 C) 97.9 F (36.6 C)  TempSrc: Axillary  Axillary Oral  SpO2: 92%  98% 96%  Weight:      Height:        Intake/Output Summary (Last 24 hours) at 07/27/2020  1119 Last data filed at 07/27/2020 0430 Gross per 24 hour  Intake 2078.71 ml  Output 300 ml  Net 1778.71 ml   Filed Weights   07/26/20 1032  Weight: 79.6 kg    General appearance: Awake alert.  In no distress Resp: Noted to be slightly tachypneic.  Crackles bilateral lungs.  No wheezing or rhonchi.   Cardio: S1-S2 is irregularly irregular.  Slightly tachycardic.   GI: Abdomen is soft.  Nontender nondistended.  Bowel sounds are present normal.  No masses organomegaly Extremities: Continues to have erythema in the left leg with some swelling.  Tenderness to palpation.  No fluctuant areas.  Erythema seems to be slightly better in the lower part of the upper leg. Neurologic:  No focal neurological deficits.     Lab Results:  Data Reviewed: I have personally reviewed following labs and imaging studies  CBC: Recent Labs  Lab 07/25/20 1346 07/26/20 1155 07/27/20 0357  WBC 5.3 8.6 8.3  NEUTROABS 3.8  --   --   HGB 16.4* 13.4 12.7  HCT 48.8* 40.2 38.0  MCV 91.2 91.8 91.8  PLT 180 171 145*    Basic Metabolic Panel: Recent Labs  Lab 07/25/20 1346 07/26/20 1155 07/27/20 0357  NA 134* 135 136  K 3.6 3.1* 3.6  CL 99 102 104  CO2 20* 21* 22  GLUCOSE 183* 108* 128*  BUN 19 20 20   CREATININE 1.10* 0.85 0.74  CALCIUM 9.5 8.5* 8.6*  MG  --   --  1.6*    GFR: Estimated Creatinine Clearance: 51.6 mL/min (by C-G formula based on SCr of 0.74 mg/dL).  Liver Function Tests: Recent Labs  Lab 07/25/20 1346  AST 51*  ALT 31  ALKPHOS 88  BILITOT QUANTITY NOT SUFFICIENT, UNABLE TO PERFORM TEST  PROT 7.0  ALBUMIN 3.4*     Coagulation Profile: Recent Labs  Lab 07/25/20 1656 07/26/20 0546 07/27/20 0357  INR 2.9* 4.1* 3.7*      Recent Results (from the past 240 hour(s))  Blood Culture (routine x 2)     Status: Abnormal (Preliminary result)   Collection Time: 07/25/20  3:30 PM   Specimen: BLOOD RIGHT HAND  Result Value Ref Range Status   Specimen Description BLOOD  RIGHT HAND  Final   Special Requests   Final    BOTTLES DRAWN AEROBIC ONLY Blood Culture results may not be optimal due to an inadequate volume of blood received in culture bottles   Culture  Setup Time   Final    GRAM POSITIVE COCCI IN CHAINS AEROBIC BOTTLE ONLY CRITICAL RESULT CALLED TO, READ BACK BY AND VERIFIED WITH: A. Rogers Blocker PharmD 8:30 07/26/20 ([email or web address])    Culture (A)  Final    STREPTOCOCCUS GROUP C SUSCEPTIBILITIES TO FOLLOW Performed at [facility], [place] [address] [place], [place] [address]    Report Status PENDING  Incomplete  Urine culture     Status: None   Collection Time: 07/25/20  3:30 PM   Specimen: In/Out Cath Urine  Result Value Ref Range Status   Specimen Description IN/OUT CATH URINE  Final   Special Requests NONE  Final   Culture   Final    NO GROWTH Performed at [facility], [place] [address] [place], [place] [address]    Report Status 07/26/2020 FINAL  Final  Blood Culture ID Panel (Reflexed)     Status: Abnormal   Collection Time: 07/25/20  3:30 PM  Result Value Ref Range Status   Enterococcus faecalis NOT DETECTED NOT DETECTED Final   Enterococcus Faecium NOT DETECTED NOT DETECTED Final   Listeria monocytogenes NOT DETECTED NOT DETECTED Final   Staphylococcus species NOT DETECTED NOT DETECTED Final   Staphylococcus aureus (BCID) NOT DETECTED NOT DETECTED Final   Staphylococcus epidermidis NOT DETECTED NOT DETECTED Final   Staphylococcus lugdunensis NOT DETECTED NOT DETECTED Final   Streptococcus species DETECTED (A) NOT DETECTED Final    Comment: Not Enterococcus species,  Streptococcus agalactiae, Streptococcus pyogenes, or Streptococcus pneumoniae. CRITICAL RESULT CALLED TO, READ BACK BY AND VERIFIED WITH: A. Rogers Blocker PharmD 8:30 07/26/20 ([email or web address])    Streptococcus agalactiae NOT DETECTED NOT DETECTED Final   Streptococcus pneumoniae NOT DETECTED NOT DETECTED Final   Streptococcus pyogenes NOT DETECTED NOT DETECTED Final    A.calcoaceticus-baumannii NOT DETECTED NOT DETECTED Final   Bacteroides fragilis NOT DETECTED NOT DETECTED Final   Enterobacterales NOT DETECTED NOT DETECTED Final   Enterobacter cloacae complex NOT DETECTED NOT DETECTED Final   Escherichia coli NOT DETECTED NOT DETECTED Final   Klebsiella aerogenes NOT DETECTED NOT DETECTED Final   Klebsiella oxytoca NOT DETECTED NOT DETECTED Final   Klebsiella pneumoniae NOT DETECTED NOT DETECTED Final   Proteus species NOT DETECTED NOT DETECTED Final   Salmonella species NOT DETECTED NOT DETECTED Final   Serratia marcescens NOT DETECTED NOT DETECTED Final   Haemophilus influenzae NOT DETECTED NOT DETECTED Final   Neisseria meningitidis NOT DETECTED NOT DETECTED Final   Pseudomonas aeruginosa NOT DETECTED NOT DETECTED Final   Stenotrophomonas maltophilia NOT DETECTED NOT DETECTED Final   Candida albicans NOT DETECTED NOT DETECTED Final   Candida auris NOT DETECTED NOT DETECTED Final   Candida glabrata NOT DETECTED NOT DETECTED Final   Candida krusei NOT DETECTED NOT DETECTED Final   Candida parapsilosis NOT DETECTED NOT DETECTED Final   Candida tropicalis NOT DETECTED NOT DETECTED Final   Cryptococcus neoformans/gattii NOT DETECTED NOT DETECTED Final    Comment: Performed at [facility], [address] [place], [place] [address]  SARS Coronavirus 2 by RT PCR (hospital order, performed in [facility] hospital lab) Nasopharyngeal Nasopharyngeal Swab     Status: None   Collection Time: 07/25/20  3:33 PM   Specimen: Nasopharyngeal Swab  Result Value Ref Range Status   SARS Coronavirus 2 NEGATIVE NEGATIVE Final    Comment: (NOTE) SARS-CoV-2 target nucleic acids are NOT DETECTED.  The SARS-CoV-2 RNA is generally detectable in upper and lower respiratory specimens during the acute phase of infection. The lowest concentration of SARS-CoV-2 viral copies this assay can detect is 250 copies / mL. A negative result does not preclude SARS-CoV-2  infection and should not be used as the sole basis for treatment or other patient management decisions.  A negative result may occur with improper specimen collection / handling, submission of specimen other than nasopharyngeal swab, presence of viral mutation(s) within the areas targeted by this assay, and inadequate number of viral copies (<250 copies / mL). A negative result must be combined with clinical observations, patient history, and epidemiological information.  Fact Sheet for Patients:   StrictlyIdeas.no  Fact Sheet for Healthcare Providers: BankingDealers.co.za  This test is not yet approved or  cleared by the [place] FDA and has been authorized for detection and/or diagnosis of SARS-CoV-2 by FDA under an Emergency Use Authorization (EUA).  This EUA will remain in effect (meaning this test can be used) for the duration of the COVID-19 declaration under Section 564(b)(1) of the Act, 21 U.S.C. section 360bbb-3(b)(1), unless the authorization is terminated or revoked sooner.  Performed at [facility], [place] [address] [place], [place] [address]       Radiology Studies: CT Angio Chest PE W/Cm &/Or Wo Cm  Result Date: 07/25/2020 CLINICAL DATA:  PE suspected, high prob Left leg swelling and pain. EXAM: CT ANGIOGRAPHY CHEST WITH CONTRAST TECHNIQUE: Multidetector CT imaging of the chest was performed using the standard protocol during bolus administration of intravenous contrast.  Multiplanar CT image reconstructions and MIPs were obtained to evaluate the vascular anatomy. Performed in conjunction with CT of the abdomen/pelvis, reported separately. CONTRAST:  84mL OMNIPAQUE IOHEXOL 350 MG/ML SOLN COMPARISON:  Radiograph earlier today. FINDINGS: Cardiovascular: There are no filling defects within the pulmonary arteries to suggest pulmonary embolus. Atherosclerosis of the thoracic aorta without aneurysm or dissection. Aorta is  tortuous. Mild cardiomegaly, with particular left atrial enlargement. Minimal pericardial fluid without significant effusion. Coronary artery calcifications. Mediastinum/Nodes: Small right hilar nodes are subcentimeter and not enlarged by size criteria. There is no mediastinal or left hilar adenopathy. Patulous mid and upper esophagus. No esophageal wall thickening. No suspicious thyroid nodule. Lungs/Pleura: Diffusely heterogeneous pulmonary parenchyma. Sub solid nodular opacity in the right upper lobe measures 12 mm, series 4, image 51. Irregular subpleural opacities involving the right greater than left lung apex are typical of pleuroparenchymal scarring, however appears slightly nodular on the right. No septal thickening or findings of pulmonary edema. No pleural effusion. Scattered atelectasis in both dependent lower lobes. Excretory phase imaging limits bronchial assessment. Upper Abdomen: Assessed on concurrent abdominal CT, reported separately. Musculoskeletal: Exaggerated upper thoracic kyphosis. Multilevel degenerative change in the spine. There are no acute or suspicious osseous abnormalities. Surgical hardware in the left proximal humerus. Review of the MIP images confirms the above findings. IMPRESSION: 1. No pulmonary embolus. 2. Diffusely heterogeneous pulmonary parenchyma, can be seen with small vessel or small airways disease. 3. Sub solid ground-glass nodular opacity in the right upper lobe measures 12 mm. This is nonspecific, possibly infectious/inflammatory. Follow-up non-contrast CT recommended at 3-6 months to confirm persistence. If unchanged, and solid component remains <6 mm, annual CT is recommended until 5 years of stability has been established. If persistent these nodules should be considered highly suspicious if the solid component of the nodule is 6 mm or greater in size and enlarging. This recommendation follows the consensus statement: Guidelines for Management of Incidental  Pulmonary Nodules Detected on CT Images: From the Fleischner Society 2017; Radiology 2017; 284:228-243. 4. Irregular subpleural opacities involving the right greater than left lung apex are typical of pleuroparenchymal scarring, however appears slightly nodular on the right. Recommend attention to this at follow-up. 5. Mild cardiomegaly with particular left atrial enlargement. Coronary artery calcifications. Aortic Atherosclerosis (ICD10-I70.0). Electronically Signed   By: Keith Rake M.D.   On: 07/25/2020 18:27   CT Abdomen Pelvis W Contrast  Result Date: 07/25/2020 CLINICAL DATA:  Acute nonlocalized abdominal pain. Left leg swelling and pain. EXAM: CT ABDOMEN AND PELVIS WITH CONTRAST TECHNIQUE: Multidetector CT imaging of the abdomen and pelvis was performed using the standard protocol following bolus administration of intravenous contrast. CONTRAST:  58mL OMNIPAQUE IOHEXOL 350 MG/ML SOLN COMPARISON:  No prior abdominal imaging. Concurrent chest CT reviewed. FINDINGS: Lower chest: Assessed on concurrent chest CT. Cardiomegaly. Coronary artery calcifications. Hepatobiliary: Post cholecystectomy. Biliary prominence is likely normal post cholecystectomy. Common bile duct measures 8 mm at the porta hepatis. There is a coarse calcification involving the anterior subcapsular liver, partially obscured by breathing motion artifact. No underlying lesion is seen on delayed phase. No suspicious focal hepatic lesion. Pancreas: Parenchymal atrophy. No ductal dilatation or inflammation. Spleen: Normal in size without focal abnormality. Adrenals/Urinary Tract: No adrenal nodule. No hydronephrosis or perinephric edema. Homogeneous renal enhancement with symmetric excretion on delayed phase imaging. Possible nonobstructing stone in the lower left kidney versus early excretion of IV contrast. Urinary bladder is distended without wall thickening. Stomach/Bowel: Nondistended stomach. There is no small bowel obstruction or  inflammatory change. High-riding cecum in the right upper quadrant. Normal appendix. Interposition of the colon anterior to the liver. Diffuse and multifocal colonic diverticulosis. Sigmoid colon is tortuous. No evidence of diverticulitis. Stool distends the rectum with possible rectocele. Vascular/Lymphatic: There prominent mildly enlarged lymph nodes in the left inguinal region measuring up to 14 mm. Prominent left external iliac nodes measure up to 9 mm. There is fat stranding adjacent to the external iliac and common femoral vessels. No discrete intraluminal filling defect in the venous structures. Arterial vascular calcifications without dissection, or acute arterial abnormality. Additional prominent left retroperitoneal nodes measure up to 8 mm the level of the iliac bifurcation. Aortic atherosclerosis and tortuosity. No aortic aneurysm. Patent portal vein. Reproductive: Status post hysterectomy. No adnexal masses. Left ovary tentatively visualized and quiescent. Right ovary not definitively seen. Other: No ascites or free fluid. No free air. No abdominopelvic abscess. There is fat stranding involving the left anterior aspect of the thigh which tracks along the iliac vessels into the retroperitoneum. No soft tissue air. Musculoskeletal: Mild to moderate compression fractures of L3 and L4 as well as mild L5 compression fracture likely chronic. Bones are diffusely under mineralized. Diffuse multilevel degenerative change in the spine. No evidence of left hip joint effusion or bony destruction. Fat stranding involving the subcutaneous tissues of the left inguinal region and anterior upper thigh without CT evidence of muscular involvement. IMPRESSION: 1. Fat stranding involving the left anterior aspect of the upper thigh which tracks along the iliac vessels into the retroperitoneum. No soft tissue air, focal fluid collection, or deep muscle involvement. There are also prominent left inguinal and external iliac  nodes. Suspect left lower extremity cellulitis with reactive adenopathy, however clinical correlation is needed. 2. No lower extremity DVT or acute vascular findings. 3. Colonic diverticulosis without diverticulitis. Stool distends the rectum with possible rectocele. 4. Compression fractures of L3, L4, and L5 are likely chronic. Aortic Atherosclerosis (ICD10-I70.0). Electronically Signed   By: Keith Rake M.D.   On: 07/25/2020 18:36   DG Chest Port 1 View  Result Date: 07/25/2020 CLINICAL DATA:  Questionable sepsis - evaluate for abnormality Chest pain.  Leg edema. EXAM: PORTABLE CHEST 1 VIEW COMPARISON:  Chest radiograph 09/29/2017 FINDINGS: Patient is rotated. The chin obscures the left lung apex. Left costophrenic angle is not included in the field of view. Mild cardiomegaly. Aortic atherosclerosis. Questionable ill-defined left basilar opacity versus overlapping soft tissue structures. Mild vascular congestion no pneumothorax. Surgical hardware in the left proximal humerus. IMPRESSION: 1. Mild cardiomegaly and vascular congestion. 2. Questionable ill-defined left basilar opacity versus overlapping soft tissue structures. 3. Limited exam due to patient's chin obscuring the apices, rotation, and left costophrenic angle excluded from the field of view. PA and lateral views recommended when patient is able. Electronically Signed   By: Keith Rake M.D.   On: 07/25/2020 15:58   VAS [place] LOWER EXTREMITY VENOUS (DVT) ([facility] and [facility] 7a-7p)  Result Date: 07/25/2020  Lower Venous DVT Study Indications: Pain, and Swelling. Sepsis.  Risk Factors: Atrial fibrillation, therapeutic on Coumadin. Anticoagulation: Coumadin. Limitations: Patient's pain with touch and RN turned lights on in room in order to draw blood and start IV. Comparison Study: No prior study on file Performing Technologist: Sharion Dove RVS  Examination Guidelines: A complete evaluation includes B-mode imaging, spectral Doppler, color Doppler,  and power Doppler as needed of all accessible portions of each vessel. Bilateral testing is considered an integral part of a complete examination. Limited examinations for reoccurring  indications may be performed as noted. The reflux portion of the exam is performed with the patient in reverse Trendelenburg.  +-----+---------------+---------+-----------+----------+----------------------+ RIGHTCompressibilityPhasicitySpontaneityPropertiesThrombus Aging         +-----+---------------+---------+-----------+----------+----------------------+ CFV                                               patent by color and                                                      Doppler                +-----+---------------+---------+-----------+----------+----------------------+   +---------+---------------+---------+-----------+----------+-------------------+ LEFT     CompressibilityPhasicitySpontaneityPropertiesThrombus Aging      +---------+---------------+---------+-----------+----------+-------------------+ CFV      Full           Yes      Yes                                      +---------+---------------+---------+-----------+----------+-------------------+ SFJ      Full                                                             +---------+---------------+---------+-----------+----------+-------------------+ FV Prox                                               patent by color and                                                       Doppler             +---------+---------------+---------+-----------+----------+-------------------+ FV Mid                                                patent by color and                                                       Doppler             +---------+---------------+---------+-----------+----------+-------------------+ FV Distal                                             patent by color and  Doppler             +---------+---------------+---------+-----------+----------+-------------------+ POP                                                   patent by color and                                                       Doppler             +---------+---------------+---------+-----------+----------+-------------------+ PTV      Full                                                             +---------+---------------+---------+-----------+----------+-------------------+ PERO     Full                                                             +---------+---------------+---------+-----------+----------+-------------------+     Summary: RIGHT: - No evidence of common femoral vein obstruction. pulsatile waveforms  LEFT: - There is no evidence of deep vein thrombosis in the lower extremity. However, portions of this examination were limited- see technologist comments above.  Pulsatile waveforms - Ultrasound characteristics of enlarged lymph nodes noted in the groin.  *See table(s) above for measurements and observations. Electronically signed by Servando Snare MD on 07/25/2020 at 7:41:09 PM.    Final        LOS: 2 days   [facility] Pager on [email or web address]  07/27/2020, 11:19 AM

## 2020-07-27 NOTE — Progress Notes (Signed)
HR back in 170s, Kyere informed and metop requested.

## 2020-07-27 NOTE — Evaluation (Signed)
Physical Therapy Evaluation Patient Details Name: [name] [name] MRN: [id number] DOB: [birth year] Today's Date: 07/27/2020   History of Present Illness  86 year old female with a past medical history of atrial fibrillation on warfarin, chronic diastolic CHF, essential hypertension, hyperlipidemia and anxiety who comes in with left leg pain nausea vomiting and diarrhea.  She was found to be febrile.  Found to be tachycardic.  Noted to have erythema and swelling of the left flank.  Admitted for management of sepsis secondary to left lower extremity cellulitis.  Clinical Impression  Pt was struggling to move due to her spine and her LLE.  Pt is getting increased pain and used CNA to manage more rapid and mechanically safer movement esp to roll and scoot up in bed.  Follow up with her to work on struggle to sit up and stand with an AD.  Family is involved but pt is living alone and should have rehab to increase safety and independence.      Follow Up Recommendations CIR    Equipment Recommendations  None recommended by PT    Recommendations for Other Services Rehab consult     Precautions / Restrictions Precautions Precautions: Fall Precaution Comments: L LE pain Restrictions Weight Bearing Restrictions: No      Mobility  Bed Mobility Overal bed mobility: Needs Assistance Bed Mobility: Supine to Sit;Sit to Supine;Rolling Rolling: Mod assist;+2 for physical assistance;+2 for safety/equipment   Supine to sit: Max assist;+2 for physical assistance;+2 for safety/equipment Sit to supine: Max assist;+2 for physical assistance;+2 for safety/equipment      Transfers                 General transfer comment: pt declined to try'  Ambulation/Gait             General Gait Details: unable to take a step  Stairs            Wheelchair Mobility    Modified Rankin (Stroke Patients Only)       Balance Overall balance assessment: Needs assistance Sitting-balance  support: Feet supported;Bilateral upper extremity supported Sitting balance-Leahy Scale: Fair                                       Pertinent Vitals/Pain Faces Pain Scale: Hurts whole lot Pain Location: L LE with movement/touch, pt also with headache Pain Descriptors / Indicators: Grimacing;Tender    Home Living Family/patient expects to be discharged to:: Private residence Living Arrangements: Alone Available Help at Discharge: Family;Available PRN/intermittently Type of Home: House Home Access: Stairs to enter;Ramped entrance Entrance Stairs-Rails: Right;Left Entrance Stairs-Number of Steps: 6 Home Layout: One level Home Equipment: Tub bench;Cane - single point;Walker - 2 wheels Additional Comments: Reports tub bench was her husbands and has difficulty using - would like regular shower chair    Prior Function Level of Independence: Independent with assistive device(s)         Comments: Uses cane inside and outside the home. Pt reports Modified Independence with ADLs, IADLs in the home. Pt was driving and grocery shopping.      Hand Dominance   Dominant Hand: Right    Extremity/Trunk Assessment   Upper Extremity Assessment Upper Extremity Assessment: Generalized weakness    Lower Extremity Assessment Lower Extremity Assessment: Generalized weakness    Cervical / Trunk Assessment Cervical / Trunk Assessment: Kyphotic  Communication   Communication: No difficulties  Cognition Arousal/Alertness: Awake/alert  Behavior During Therapy: Flat affect Overall Cognitive Status: Within Functional Limits for tasks assessed                                        General Comments General comments (skin integrity, edema, etc.): requires O2 and is stable with mobility    Exercises     Assessment/Plan    PT Assessment Patient needs continued PT services  PT Problem List Decreased strength;Decreased range of motion;Decreased activity  tolerance;Decreased balance;Decreased mobility;Decreased coordination;Pain       PT Treatment Interventions DME instruction;Gait training;Functional mobility training;Therapeutic activities;Therapeutic exercise;Balance training;Neuromuscular re-education;Patient/family education    PT Goals (Current goals can be found in the Care Plan section)  Acute Rehab PT Goals Patient Stated Goal: manage pain PT Goal Formulation: With patient/family Time For Goal Achievement: 08/10/20 Potential to Achieve Goals: Good    Frequency Min 3X/week   Barriers to discharge Inaccessible home environment;Decreased caregiver support      Co-evaluation               AM-PAC PT "6 Clicks" Mobility  Outcome Measure Help needed turning from your back to your side while in a flat bed without using bedrails?: A Lot Help needed moving from lying on your back to sitting on the side of a flat bed without using bedrails?: A Lot Help needed moving to and from a bed to a chair (including a wheelchair)?: A Lot Help needed standing up from a chair using your arms (e.g., wheelchair or bedside chair)?: Total Help needed to walk in hospital room?: Total Help needed climbing 3-5 steps with a railing? : Total 6 Click Score: 9    End of Session   Activity Tolerance: Patient limited by fatigue;Treatment limited secondary to medical complications (Comment) Patient left: in bed;with call bell/phone within reach;with family/visitor present Nurse Communication: Mobility status PT Visit Diagnosis: Muscle weakness (generalized) (M62.81);Pain Pain - Right/Left: Left Pain - part of body: Leg;Knee;Ankle and joints of foot    Time: 8871-9597 PT Time Calculation (min) (ACUTE ONLY): 27 min   Charges:   PT Evaluation $PT Eval Moderate Complexity: 1 Mod PT Treatments $Therapeutic Activity: 8-22 mins       Ramond Dial 07/27/2020, 10:37 PM  Mee Hives, PT MS Acute Rehab Dept. Number: [place] [facility] [place]

## 2020-07-27 NOTE — Progress Notes (Signed)
Pt noted to have HR conti in 150s. Kyere (triad) informed, 1x dose metop order. Chrg RN Sharee Pimple, RRT Shanon Brow and Jinny Blossom came to bedside to assess pt. Pt AOx4 resting comfortably in bed. Will conti to monitor. Collaboration appreciated.

## 2020-07-27 NOTE — Progress Notes (Signed)
[place] for Warfarin  Indication: atrial fibrillation  Allergies  Allergen Reactions  . Antihistamines, Diphenhydramine-Type Other (See Comments)    ALL ANTIHISTAMINES causes her heart to race  . Oxycodone-Acetaminophen Nausea Only    Patient Measurements: Height: 5\' 4"  (162.6 cm) Weight: 79.6 kg (175 lb 7.8 oz) IBW/kg (Calculated) : 54.7 Heparin Dosing Weight:   Vital Signs: Temp: 98 F (36.7 C) (08/31 0432) Temp Source: Axillary (08/31 0432) BP: 104/69 (08/31 0623) Pulse Rate: 118 (08/31 0432)  Labs: Recent Labs    07/25/20 1346 07/25/20 1346 07/25/20 1656 07/26/20 0546 07/26/20 1155 07/27/20 0357  HGB 16.4*   < >  --   --  13.4 12.7  HCT 48.8*  --   --   --  40.2 38.0  PLT 180  --   --   --  171 145*  APTT  --   --  45*  --   --   --   LABPROT  --   --  29.6* 38.7*  --  35.2*  INR  --   --  2.9* 4.1*  --  3.7*  CREATININE 1.10*  --   --   --  0.85 0.74   < > = values in this interval not displayed.    Estimated Creatinine Clearance: 51.6 mL/min (by C-G formula based on SCr of 0.74 mg/dL).   Medical History: Past Medical History:  Diagnosis Date  . A-fib ([place])    paroxysmal  . Aftercare for healing traumatic fracture of arm, unspecified   . H/O: hemorrhoidectomy   . H/O: hysterectomy   . Hyperlipidemia   . Hypertension   . Malignant melanoma of skin of trunk, except scrotum ([place])   . Other postprocedural status(V45.89)   . Rosacea   . S/P cholecystectomy   . Sciatica    right  . Sprain of lumbar region     Medications:  Scheduled:  . atorvastatin  20 mg Oral q1800  . metoprolol tartrate      . Warfarin - Pharmacist Dosing Inpatient   Does not apply q1600    Assessment: Patient is a 25 yof that presents to the ED with N/V/D. The patient was found to be septic and started on broad spectrum antibiotics. The patient takes warfarin PTA for afib. Pharmacy has been asked to continue dosing warfarin while  inpatient.   Warfarin PTA dose: 5mg  on Monday and 2.5 ROW   INR remains elevated at 3.7 likely due to underlying infection. CBC stable, no S/Sx bleeding documented.  Goal of Therapy:  INR 2-3 Monitor platelets by anticoagulation protocol: Yes   Plan:  Hold warfarin again tonight Protime in am   Arrie Senate, PharmD, BCPS Clinical Pharmacist [phone] Please check AMION for all [place] numbers 07/27/2020

## 2020-07-27 NOTE — Plan of Care (Signed)

## 2020-07-28 LAB — CULTURE, BLOOD (ROUTINE X 2)

## 2020-07-28 LAB — CBC
HCT: 41.6 % (ref 36.0–46.0)
Hemoglobin: 13.4 g/dL (ref 12.0–15.0)
MCH: 30.2 pg (ref 26.0–34.0)
MCHC: 32.2 g/dL (ref 30.0–36.0)
MCV: 93.7 fL (ref 80.0–100.0)
Platelets: 134 10*3/uL — ABNORMAL LOW (ref 150–400)
RBC: 4.44 MIL/uL (ref 3.87–5.11)
RDW: 13.6 % (ref 11.5–15.5)
WBC: 9 10*3/uL (ref 4.0–10.5)
nRBC: 0 % (ref 0.0–0.2)

## 2020-07-28 LAB — BASIC METABOLIC PANEL
Anion gap: 11 (ref 5–15)
BUN: 19 mg/dL (ref 8–23)
CO2: 20 mmol/L — ABNORMAL LOW (ref 22–32)
Calcium: 8.7 mg/dL — ABNORMAL LOW (ref 8.9–10.3)
Chloride: 105 mmol/L (ref 98–111)
Creatinine, Ser: 0.63 mg/dL (ref 0.44–1.00)
GFR calc Af Amer: >60 mL/min (ref >60)
GFR calc non Af Amer: >60 mL/min (ref >60)
Glucose, Bld: 116 mg/dL — ABNORMAL HIGH (ref 70–99)
Potassium: 4.1 mmol/L (ref 3.5–5.1)
Sodium: 136 mmol/L (ref 135–145)

## 2020-07-28 LAB — MAGNESIUM: Magnesium: 2.2 mg/dL (ref 1.7–2.4)

## 2020-07-28 LAB — PROTIME-INR
INR: 3 — ABNORMAL HIGH (ref 0.8–1.2)
Prothrombin Time: 30.5 seconds — ABNORMAL HIGH (ref 11.4–15.2)

## 2020-07-28 LAB — TSH: TSH: 3.776 u[IU]/mL (ref 0.350–4.500)

## 2020-07-28 LAB — BRAIN NATRIURETIC PEPTIDE: B Natriuretic Peptide: 392.6 pg/mL — ABNORMAL HIGH (ref 0.0–100.0)

## 2020-07-28 LAB — LACTIC ACID, PLASMA: Lactic Acid, Venous: 1.7 mmol/L (ref 0.5–1.9)

## 2020-07-28 LAB — MRSA PCR SCREENING: MRSA by PCR: NEGATIVE

## 2020-07-28 MED ORDER — FUROSEMIDE 40 MG PO TABS
40.0000 mg | ORAL_TABLET | Freq: Every day | ORAL | Status: DC
  Administered 2020-07-28: 40 mg via ORAL
  Filled 2020-07-28: qty 1

## 2020-07-28 MED ORDER — CARBOXYMETHYLCELLULOSE SODIUM 0.25 % OP SOLN: Freq: Two times a day (BID) | OPHTHALMIC | Status: DC | PRN

## 2020-07-28 MED ORDER — METOPROLOL TARTRATE 25 MG PO TABS
25.0000 mg | ORAL_TABLET | Freq: Once | ORAL | Status: AC
  Administered 2020-07-28: 25 mg via ORAL
  Filled 2020-07-28: qty 1

## 2020-07-28 MED ORDER — FUROSEMIDE 10 MG/ML IJ SOLN
20.0000 mg | Freq: Two times a day (BID) | INTRAMUSCULAR | Status: AC
  Administered 2020-07-28 – 2020-08-05 (×17): 20 mg via INTRAVENOUS
  Filled 2020-07-28 (×17): qty 2

## 2020-07-28 MED ORDER — METOPROLOL TARTRATE 5 MG/5ML IV SOLN
2.5000 mg | INTRAVENOUS | Status: DC | PRN
  Administered 2020-07-28 – 2020-07-29 (×3): 2.5 mg via INTRAVENOUS
  Filled 2020-07-28 (×4): qty 5

## 2020-07-28 MED ORDER — WARFARIN SODIUM 2.5 MG PO TABS
2.5000 mg | ORAL_TABLET | Freq: Once | ORAL | Status: AC
  Administered 2020-07-28: 2.5 mg via ORAL
  Filled 2020-07-28: qty 1

## 2020-07-28 MED ORDER — CEFAZOLIN SODIUM-DEXTROSE 2-4 GM/100ML-% IV SOLN
2.0000 g | Freq: Three times a day (TID) | INTRAVENOUS | Status: DC
  Administered 2020-07-28 – 2020-07-29 (×3): 2 g via INTRAVENOUS
  Filled 2020-07-28 (×4): qty 100

## 2020-07-28 MED ORDER — POLYVINYL ALCOHOL 1.4 % OP SOLN
1.0000 [drp] | Freq: Two times a day (BID) | OPHTHALMIC | Status: DC | PRN
  Filled 2020-07-28: qty 15

## 2020-07-28 MED ORDER — CALCIUM CARBONATE-VITAMIN D 500-200 MG-UNIT PO TABS
1.0000 | ORAL_TABLET | ORAL | Status: DC
  Administered 2020-07-28 – 2020-07-30 (×2): 1 via ORAL
  Filled 2020-07-28 (×2): qty 1

## 2020-07-28 MED ORDER — METOPROLOL TARTRATE 50 MG PO TABS
50.0000 mg | ORAL_TABLET | Freq: Two times a day (BID) | ORAL | Status: DC
  Administered 2020-07-28: 50 mg via ORAL
  Filled 2020-07-28: qty 1

## 2020-07-28 NOTE — Progress Notes (Signed)
[name] [name] for Warfarin  Indication: atrial fibrillation  Allergies  Allergen Reactions  . Antihistamines, Diphenhydramine-Type Other (See Comments)    ALL ANTIHISTAMINES causes her heart to race  . Oxycodone-Acetaminophen Nausea Only    Patient Measurements: Height: 5\' 4"  (162.6 cm) Weight: 79.6 kg (175 lb 7.8 oz) IBW/kg (Calculated) : 54.7 Heparin Dosing Weight:   Vital Signs: Temp: 97.5 F (36.4 C) (09/01 0100) Temp Source: Oral (09/01 0100) BP: 105/78 (09/01 0100) Pulse Rate: 98 (09/01 0100)  Labs: Recent Labs    07/25/20 1346 07/25/20 1656 07/25/20 1656 07/26/20 0546 07/26/20 1155 07/26/20 1155 07/27/20 0357 07/28/20 0507  HGB   < >  --   --   --  13.4   < > 12.7 13.4  HCT   < >  --   --   --  40.2  --  38.0 41.6  PLT   < >  --   --   --  171  --  145* 134*  APTT  --  45*  --   --   --   --   --   --   LABPROT  --  29.6*   < > 38.7*  --   --  35.2* 30.5*  INR  --  2.9*   < > 4.1*  --   --  3.7* 3.0*  CREATININE   < >  --   --   --  0.85  --  0.74 0.63   < > = values in this interval not displayed.    Estimated Creatinine Clearance: 51.6 mL/min (by C-G formula based on SCr of 0.63 mg/dL).   Medical History: Past Medical History:  Diagnosis Date  . A-fib ([place])    paroxysmal  . Aftercare for healing traumatic fracture of arm, unspecified   . H/O: hemorrhoidectomy   . H/O: hysterectomy   . Hyperlipidemia   . Hypertension   . Malignant melanoma of skin of trunk, except scrotum ([place])   . Other postprocedural status(V45.89)   . Rosacea   . S/P cholecystectomy   . Sciatica    right  . Sprain of lumbar region     Medications:  Scheduled:  . atorvastatin  20 mg Oral q1800  . metoprolol tartrate  25 mg Oral BID  . Warfarin - Pharmacist Dosing Inpatient   Does not apply q1600    Assessment: Patient is a 76 yof that presents to the ED with N/V/D. The patient was found to be septic and started on broad spectrum  antibiotics. The patient takes warfarin PTA for afib. Pharmacy has been asked to continue dosing warfarin while inpatient.   Warfarin PTA dose: 5mg  on Monday and 2.5 ROW   INR now down to therapeutic range at  3.0, pltc trending down, H/H wnl.  Goal of Therapy:  INR 2-3 Monitor platelets by anticoagulation protocol: Yes   Plan:  Warfarin 2.5mg  PO x1 tonight Daily protime   Arrie Senate, PharmD, BCPS Clinical Pharmacist [phone] Please check AMION for all [place] numbers 07/28/2020

## 2020-07-28 NOTE — Progress Notes (Signed)
Physical Therapy Treatment Patient Details Name: [name] [name] MRN: [id number] DOB: 1931-01-07 Today's Date: 07/28/2020    History of Present Illness 86 year old female with a past medical history of atrial fibrillation on warfarin, chronic diastolic CHF, essential hypertension, hyperlipidemia and anxiety who comes in with left leg pain nausea vomiting and diarrhea.  She was found to be febrile.  Found to be tachycardic.  Noted to have erythema and swelling of the left flank.  Admitted for management of sepsis secondary to left lower extremity cellulitis.    PT Comments    Pt supine in bed on entry HR in 100s. Pt reports pain in L LE but eagerness to work with therapy. Pt bed found to be soaked in urine so recommended moving to recliner. Both pt and pt's son agreeable. Pt able to perform movement of L LE with less pain than with physical assist from therapist. Pt cued for use of bent R LE to offweight L LE for moving across bed. Pt able to perform while therapist provided assist for scooting hips with bed pad. Once EOB pt able to progress from maxA for balance to min guard. Pt required total A for squat pivot transfer to recliner. Once in recliner pt reported feeling much better and color returned to her face. Given pt's increased willingness to work with therapy despite pain with movement continue to recommend CIR level rehab at discharge. PT will continue to follow acutely.    Follow Up Recommendations  CIR     Equipment Recommendations  None recommended by PT    Recommendations for Other Services Rehab consult     Precautions / Restrictions Precautions Precautions: Fall Precaution Comments: L LE pain Restrictions Weight Bearing Restrictions: No    Mobility  Bed Mobility Overal bed mobility: Needs Assistance Bed Mobility: Supine to Sit     Supine to sit: Max assist;+2 for physical assistance;+2 for safety/equipment     General bed mobility comments: maxAx2 for coming to EoB,  initially requires maxA for steadying and then able to progress to min guard   Transfers Overall transfer level: Needs assistance   Transfers: Squat Pivot Transfers     Squat pivot transfers: +2 physical assistance;Total assist     General transfer comment: total Ax2 for squat pivot to the recliner  Ambulation/Gait             General Gait Details: unable to take a step         Balance Overall balance assessment: Needs assistance Sitting-balance support: Feet supported;Bilateral upper extremity supported Sitting balance-Leahy Scale: Poor Sitting balance - Comments: initially max A able to progress to min guard                                     Cognition Arousal/Alertness: Awake/alert Behavior During Therapy: Flat affect Overall Cognitive Status: Within Functional Limits for tasks assessed                                           General Comments General comments (skin integrity, edema, etc.): SaO2 on 3 L O2 via [place] >90%O2 throughout session HR 96-136 bpm with 120-130s during transfer       Pertinent Vitals/Pain Pain Assessment: Faces Faces Pain Scale: Hurts whole lot Pain Location: L LE with movement/touch Pain Descriptors / Indicators:  Grimacing;Tender Pain Intervention(s): Limited activity within patient's tolerance;Monitored during session;Repositioned    Home Living Family/patient expects to be discharged to:: Private residence Living Arrangements: Alone Available Help at Discharge: Family;Available PRN/intermittently Type of Home: House Home Access: Stairs to enter;Ramped entrance Entrance Stairs-Rails: Right;Left Home Layout: One level Home Equipment: Tub bench;Cane - single point;Walker - 2 wheels Additional Comments: Reports tub bench was her husbands and has difficulty using - would like regular shower chair    Prior Function Level of Independence: Independent with assistive device(s)      Comments: Uses cane  inside and outside the home. Pt reports Modified Independence with ADLs, IADLs in the home. Pt was driving and grocery shopping.    PT Goals (current goals can now be found in the care plan section) Acute Rehab PT Goals Patient Stated Goal: manage pain PT Goal Formulation: With patient/family Time For Goal Achievement: 08/10/20 Potential to Achieve Goals: Good Progress towards PT goals: Progressing toward goals    Frequency    Min 3X/week      PT Plan Current plan remains appropriate       AM-PAC PT "6 Clicks" Mobility   Outcome Measure  Help needed turning from your back to your side while in a flat bed without using bedrails?: A Lot Help needed moving from lying on your back to sitting on the side of a flat bed without using bedrails?: A Lot Help needed moving to and from a bed to a chair (including a wheelchair)?: A Lot Help needed standing up from a chair using your arms (e.g., wheelchair or bedside chair)?: Total Help needed to walk in hospital room?: Total Help needed climbing 3-5 steps with a railing? : Total 6 Click Score: 9    End of Session Equipment Utilized During Treatment: Gait belt;Oxygen Activity Tolerance: Patient limited by pain;Treatment limited secondary to medical complications (Comment) (elevated HR) Patient left: with call bell/phone within reach;with family/visitor present;in chair;with chair alarm set Nurse Communication: Mobility status PT Visit Diagnosis: Muscle weakness (generalized) (M62.81);Pain Pain - Right/Left: Left Pain - part of body: Leg;Knee;Ankle and joints of foot     Time: 1102-1130 PT Time Calculation (min) (ACUTE ONLY): 28 min  Charges:  $Therapeutic Activity: 23-37 mins                     [place] Maiorino B. Migdalia Dk PT, DPT Acute Rehabilitation Services Pager [phone] Office [phone]    [place] 07/28/2020, 2:34 PM

## 2020-07-28 NOTE — Progress Notes (Signed)
PROGRESS NOTE  [name] OVF:[id number] DOB: 25-Jan-1931 DOA: 07/25/2020 PCP: Hoyt Koch, MD  HPI/Recap of past 83 hours: 86 year old Caucasian female with a past medical history of atrial fibrillation on warfarin, chronic diastolic CHF, essential hypertension, hyperlipidemia and anxiety who comes in with left leg pain nausea vomiting and diarrhea.  She was found to be febrile.  Found to be tachycardic.  Noted to have erythema and swelling of the left flank.  Admitted for management of sepsis secondary to left lower extremity cellulitis.   07/28/20: Seen and examined with her daughter at her bedside. LLE cellulitis  Improving but persistent erythema, tenderness and warmth.  Assessment/Plan: Principal Problem:   Sepsis ([place]) Active Problems:   Hyperlipidemia   Chronic diastolic heart failure (HCC)   Left leg cellulitis   Atrial fibrillation with RVR (HCC)   Hypotension   Lung nodule  Severe sepsis with septic shock secondary to left lower extremity cellulitis/bacteremia Patient had lactic acidosis 6.7, now resolved.  She was tachycardic.  Noted to be febrile.  WBC however was normal.  Patient was aggressively hydrated with improvement in lactic acid. Patient initially started on vancomycin cefepime and Flagyl.  Blood cultures growing strep group C, pansensitive.  Ceftriaxone switched to unasyn on 07/28/20. No DVT noted on Doppler studies.  Left lower extremity seems to be improving gradually.   Strep groupC bacteremia, likely from skin source, left lower extremity cellulitis. From blood culture drawn on 07/25/2020, positive for Streptococcus group C, pansensitive Management as stated above  Acute hypoxic respiratory failure secondary to mild pulmonary edema, likely from volume overload Currently on 2 L oxygen supplementation Maintain O2 saturation greater than 92% Independent reviewed chest x-ray with daughter in the room, showing increasing pulmonary vascularity  suggestive of pleural edema Home p.o. Lasix restarted Home O2 evaluation when close to DC  Resolving AKI Baseline creatinine appears to be 0.6 with GFR greater than 60 Presented with creatinine of 1.1 Creatinine is back to baseline Continue to avoid nephrotoxins Monitor urine output  Resolved post repletion: Hypokalemia and hypomagnesemia Serum potassium 4.1, serum magnesium 2.2. Replete as indicated  Paroxysmal Atrial fibrillation with RVR Known history of A. fib.   Continue metoprolol for rate control, dose increased to 50 mg twice daily.   Continue Coumadin for CVA prevention Continue to monitor vital signs   Right upper lobe lung nodule Incidental finding.  Outpatient monitoring.  Chronic diastolic CHF Was initially hypovolemic.  Now noted to be on oxygen with few crackles in the lungs. Continue strict I's and O's and daily weight Continue cardiac medications  Hyperlipidemia Continue statin.  Recent eye surgery Resume home eyedrops  Physical debility PT OT to assess Continue fall precautions   DVT Prophylaxis: On warfarin Code Status: Full code Family Communication: Discussed with the patient's daughter at bedside. Disposition Plan:     Status is: Inpatient    Dispo:  Patient From: Home  Planned Disposition: Inpatient Rehab  Expected discharge date: 07/29/20  Medically stable for discharge: No         Objective: Vitals:   07/28/20 0700 07/28/20 0732 07/28/20 0809 07/28/20 1117  BP: 129/77 129/77 129/77 129/77  Pulse: (!) 147 (!) 147 (!) 147   Resp:  18 18 18   Temp: 98 F (36.7 C) 98 F (36.7 C) 98 F (36.7 C) 98 F (36.7 C)  TempSrc: Oral     SpO2: 98%     Weight: 88.5 kg     Height:  Intake/Output Summary (Last 24 hours) at 07/28/2020 1250 Last data filed at 07/27/2020 2015 Gross per 24 hour  Intake 340 ml  Output 1100 ml  Net -760 ml   Filed Weights   07/26/20 1032 07/28/20 0700  Weight: 79.6 kg 88.5 kg     Exam:  . General: 86 y.o. year-old female well developed well nourished in no acute distress.  Alert and oriented x3. . Cardiovascular: Regular rate and rhythm with no rubs or gallops.  No thyromegaly or JVD noted.   Marland Kitchen Respiratory: Clear to auscultation with no wheezes or rales. Good inspiratory effort. . Abdomen: Soft nontender nondistended with normal bowel sounds x4 quadrants. . Musculoskeletal: Trace lower extremity edema bilaterally. . Skin: Left lower extremity, erythematous, warm and tender with palpation. Marland Kitchen Psychiatry: Mood is appropriate for condition and setting   Data Reviewed: CBC: Recent Labs  Lab 07/25/20 1346 07/26/20 1155 07/27/20 0357 07/28/20 0507  WBC 5.3 8.6 8.3 9.0  NEUTROABS 3.8  --   --   --   HGB 16.4* 13.4 12.7 13.4  HCT 48.8* 40.2 38.0 41.6  MCV 91.2 91.8 91.8 93.7  PLT 180 171 145* 768*   Basic Metabolic Panel: Recent Labs  Lab 07/25/20 1346 07/26/20 1155 07/27/20 0357 07/28/20 0507  NA 134* 135 136 136  K 3.6 3.1* 3.6 4.1  CL 99 102 104 105  CO2 20* 21* 22 20*  GLUCOSE 183* 108* 128* 116*  BUN 19 20 20 19   CREATININE 1.10* 0.85 0.74 0.63  CALCIUM 9.5 8.5* 8.6* 8.7*  MG  --   --  1.6* 2.2   GFR: Estimated Creatinine Clearance: 54.3 mL/min (by C-G formula based on SCr of 0.63 mg/dL). Liver Function Tests: Recent Labs  Lab 07/25/20 1346  AST 51*  ALT 31  ALKPHOS 88  BILITOT QUANTITY NOT SUFFICIENT, UNABLE TO PERFORM TEST  PROT 7.0  ALBUMIN 3.4*   No results for input(s): LIPASE, AMYLASE in the last 168 hours. No results for input(s): AMMONIA in the last 168 hours. Coagulation Profile: Recent Labs  Lab 07/25/20 1656 07/26/20 0546 07/27/20 0357 07/28/20 0507  INR 2.9* 4.1* 3.7* 3.0*   Cardiac Enzymes: No results for input(s): CKTOTAL, CKMB, CKMBINDEX, TROPONINI in the last 168 hours. BNP (last 3 results) No results for input(s): PROBNP in the last 8760 hours. HbA1C: No results for input(s): HGBA1C in the last 72  hours. CBG: No results for input(s): GLUCAP in the last 168 hours. Lipid Profile: No results for input(s): CHOL, HDL, LDLCALC, TRIG, CHOLHDL, LDLDIRECT in the last 72 hours. Thyroid Function Tests: No results for input(s): TSH, T4TOTAL, FREET4, T3FREE, THYROIDAB in the last 72 hours. Anemia Panel: No results for input(s): VITAMINB12, FOLATE, FERRITIN, TIBC, IRON, RETICCTPCT in the last 72 hours. Urine analysis:    Component Value Date/Time   COLORURINE YELLOW 07/25/2020 1758   APPEARANCEUR CLEAR 07/25/2020 1758   LABSPEC 1.018 07/25/2020 1758   PHURINE 6.0 07/25/2020 1758   GLUCOSEU NEGATIVE 07/25/2020 1758   HGBUR NEGATIVE 07/25/2020 1758   BILIRUBINUR NEGATIVE 07/25/2020 1758   KETONESUR NEGATIVE 07/25/2020 1758   PROTEINUR NEGATIVE 07/25/2020 1758   NITRITE NEGATIVE 07/25/2020 1758   LEUKOCYTESUR NEGATIVE 07/25/2020 1758   Sepsis Labs: @LABRCNTIP (procalcitonin:4,lacticidven:4)  ) Recent Results (from the past 240 hour(s))  Blood Culture (routine x 2)     Status: Abnormal   Collection Time: 07/25/20  3:30 PM   Specimen: BLOOD RIGHT HAND  Result Value Ref Range Status   Specimen Description BLOOD RIGHT HAND  Final   Special Requests   Final    BOTTLES DRAWN AEROBIC ONLY Blood Culture results may not be optimal due to an inadequate volume of blood received in culture bottles   Culture  Setup Time   Final    GRAM POSITIVE COCCI IN CHAINS AEROBIC BOTTLE ONLY CRITICAL RESULT CALLED TO, READ BACK BY AND VERIFIED WITH: A. Rogers Blocker PharmD 8:30 07/26/20 ([email or web address]) Performed at [facility], [place] [address], [place], [place] [address]    Culture STREPTOCOCCUS GROUP C (A)  Final   Report Status 07/28/2020 FINAL  Final   Organism ID, Bacteria STREPTOCOCCUS GROUP C  Final      Susceptibility   Streptococcus group c - MIC*    CLINDAMYCIN <=0.25 SENSITIVE Sensitive     AMPICILLIN <=0.25 SENSITIVE Sensitive     ERYTHROMYCIN <=0.12 SENSITIVE Sensitive     VANCOMYCIN 0.25 SENSITIVE  Sensitive     CEFTRIAXONE <=0.12 SENSITIVE Sensitive     LEVOFLOXACIN 1 SENSITIVE Sensitive     * STREPTOCOCCUS GROUP C  Urine culture     Status: None   Collection Time: 07/25/20  3:30 PM   Specimen: In/Out Cath Urine  Result Value Ref Range Status   Specimen Description IN/OUT CATH URINE  Final   Special Requests NONE  Final   Culture   Final    NO GROWTH Performed at [facility], [place] [address] [place], [place] [address]    Report Status 07/26/2020 FINAL  Final  Blood Culture ID Panel (Reflexed)     Status: Abnormal   Collection Time: 07/25/20  3:30 PM  Result Value Ref Range Status   Enterococcus faecalis NOT DETECTED NOT DETECTED Final   Enterococcus Faecium NOT DETECTED NOT DETECTED Final   Listeria monocytogenes NOT DETECTED NOT DETECTED Final   Staphylococcus species NOT DETECTED NOT DETECTED Final   Staphylococcus aureus (BCID) NOT DETECTED NOT DETECTED Final   Staphylococcus epidermidis NOT DETECTED NOT DETECTED Final   Staphylococcus lugdunensis NOT DETECTED NOT DETECTED Final   Streptococcus species DETECTED (A) NOT DETECTED Final    Comment: Not Enterococcus species, Streptococcus agalactiae, Streptococcus pyogenes, or Streptococcus pneumoniae. CRITICAL RESULT CALLED TO, READ BACK BY AND VERIFIED WITH: A. Rogers Blocker PharmD 8:30 07/26/20 ([email or web address])    Streptococcus agalactiae NOT DETECTED NOT DETECTED Final   Streptococcus pneumoniae NOT DETECTED NOT DETECTED Final   Streptococcus pyogenes NOT DETECTED NOT DETECTED Final   A.calcoaceticus-baumannii NOT DETECTED NOT DETECTED Final   Bacteroides fragilis NOT DETECTED NOT DETECTED Final   Enterobacterales NOT DETECTED NOT DETECTED Final   Enterobacter cloacae complex NOT DETECTED NOT DETECTED Final   Escherichia coli NOT DETECTED NOT DETECTED Final   Klebsiella aerogenes NOT DETECTED NOT DETECTED Final   Klebsiella oxytoca NOT DETECTED NOT DETECTED Final   Klebsiella pneumoniae NOT DETECTED NOT DETECTED Final    Proteus species NOT DETECTED NOT DETECTED Final   Salmonella species NOT DETECTED NOT DETECTED Final   Serratia marcescens NOT DETECTED NOT DETECTED Final   Haemophilus influenzae NOT DETECTED NOT DETECTED Final   Neisseria meningitidis NOT DETECTED NOT DETECTED Final   Pseudomonas aeruginosa NOT DETECTED NOT DETECTED Final   Stenotrophomonas maltophilia NOT DETECTED NOT DETECTED Final   Candida albicans NOT DETECTED NOT DETECTED Final   Candida auris NOT DETECTED NOT DETECTED Final   Candida glabrata NOT DETECTED NOT DETECTED Final   Candida krusei NOT DETECTED NOT DETECTED Final   Candida parapsilosis NOT DETECTED NOT DETECTED Final   Candida tropicalis NOT DETECTED  NOT DETECTED Final   Cryptococcus neoformans/gattii NOT DETECTED NOT DETECTED Final    Comment: Performed at [facility], [place] [address] [place], [place] [address]  SARS Coronavirus 2 by RT PCR (hospital order, performed in [facility] hospital lab) Nasopharyngeal Nasopharyngeal Swab     Status: None   Collection Time: 07/25/20  3:33 PM   Specimen: Nasopharyngeal Swab  Result Value Ref Range Status   SARS Coronavirus 2 NEGATIVE NEGATIVE Final    Comment: (NOTE) SARS-CoV-2 target nucleic acids are NOT DETECTED.  The SARS-CoV-2 RNA is generally detectable in upper and lower respiratory specimens during the acute phase of infection. The lowest concentration of SARS-CoV-2 viral copies this assay can detect is 250 copies / mL. A negative result does not preclude SARS-CoV-2 infection and should not be used as the sole basis for treatment or other patient management decisions.  A negative result may occur with improper specimen collection / handling, submission of specimen other than nasopharyngeal swab, presence of viral mutation(s) within the areas targeted by this assay, and inadequate number of viral copies (<250 copies / mL). A negative result must be combined with clinical observations, patient history, and  epidemiological information.  Fact Sheet for Patients:   StrictlyIdeas.no  Fact Sheet for Healthcare Providers: BankingDealers.co.za  This test is not yet approved or  cleared by the [place] FDA and has been authorized for detection and/or diagnosis of SARS-CoV-2 by FDA under an Emergency Use Authorization (EUA).  This EUA will remain in effect (meaning this test can be used) for the duration of the COVID-19 declaration under Section 564(b)(1) of the Act, 21 U.S.C. section 360bbb-3(b)(1), unless the authorization is terminated or revoked sooner.  Performed at [facility], [place] [address] [place], [place] [address]       Studies: No results found.  Scheduled Meds: . atorvastatin  20 mg Oral q1800  . calcium-vitamin D  1 tablet Oral QODAY  . furosemide  40 mg Oral Daily  . metoprolol tartrate  50 mg Oral BID  . warfarin  2.5 mg Oral ONCE-1600  . Warfarin - Pharmacist Dosing Inpatient   Does not apply q1600    Continuous Infusions: .  ceFAZolin (ANCEF) IV       LOS: 3 days     Kayleen Memos, MD [facility] Pager [phone]  If 7PM-7AM, please contact night-coverage [email or web address] Password [email or web address] 07/28/2020, 12:50 PM

## 2020-07-28 NOTE — Progress Notes (Signed)
Inpatient Rehab Admissions Coordinator Note:   Per PT recommendations, pt was screened for CIR candidacy by Shann Medal, PT, DPT.  Note pt requiring max +2 to total +2 assistance for limited eval.  OT recommending SNF.  I will follow for 1 more therapy session to assess pt's tolerance for CIR.  Please contact me with questions.   Shann Medal, PT, DPT [phone] 07/28/20 12:26 PM

## 2020-07-29 ENCOUNTER — Other Ambulatory Visit: Payer: Self-pay | Admitting: Internal Medicine

## 2020-07-29 ENCOUNTER — Ambulatory Visit: Payer: Medicare Other

## 2020-07-29 ENCOUNTER — Inpatient Hospital Stay (HOSPITAL_COMMUNITY): Payer: Medicare Other

## 2020-07-29 DIAGNOSIS — I371 Nonrheumatic pulmonary valve insufficiency: Secondary | ICD-10-CM

## 2020-07-29 DIAGNOSIS — I361 Nonrheumatic tricuspid (valve) insufficiency: Secondary | ICD-10-CM

## 2020-07-29 DIAGNOSIS — I340 Nonrheumatic mitral (valve) insufficiency: Secondary | ICD-10-CM

## 2020-07-29 DIAGNOSIS — L8990 Pressure ulcer of unspecified site, unspecified stage: Secondary | ICD-10-CM | POA: Insufficient documentation

## 2020-07-29 LAB — COMPREHENSIVE METABOLIC PANEL
ALT: 78 U/L — ABNORMAL HIGH (ref 0–44)
AST: 129 U/L — ABNORMAL HIGH (ref 15–41)
Albumin: 1.8 g/dL — ABNORMAL LOW (ref 3.5–5.0)
Alkaline Phosphatase: 135 U/L — ABNORMAL HIGH (ref 38–126)
Anion gap: 10 (ref 5–15)
BUN: 11 mg/dL (ref 8–23)
CO2: 25 mmol/L (ref 22–32)
Calcium: 8.8 mg/dL — ABNORMAL LOW (ref 8.9–10.3)
Chloride: 101 mmol/L (ref 98–111)
Creatinine, Ser: 0.64 mg/dL (ref 0.44–1.00)
GFR calc Af Amer: >60 mL/min (ref >60)
GFR calc non Af Amer: >60 mL/min (ref >60)
Glucose, Bld: 130 mg/dL — ABNORMAL HIGH (ref 70–99)
Potassium: 4 mmol/L (ref 3.5–5.1)
Sodium: 136 mmol/L (ref 135–145)
Total Bilirubin: 1.3 mg/dL — ABNORMAL HIGH (ref 0.3–1.2)
Total Protein: 5.7 g/dL — ABNORMAL LOW (ref 6.5–8.1)

## 2020-07-29 LAB — CBC
HCT: 42.6 % (ref 36.0–46.0)
Hemoglobin: 13.9 g/dL (ref 12.0–15.0)
MCH: 30.2 pg (ref 26.0–34.0)
MCHC: 32.6 g/dL (ref 30.0–36.0)
MCV: 92.6 fL (ref 80.0–100.0)
Platelets: 172 10*3/uL (ref 150–400)
RBC: 4.6 MIL/uL (ref 3.87–5.11)
RDW: 13.9 % (ref 11.5–15.5)
WBC: 11.7 10*3/uL — ABNORMAL HIGH (ref 4.0–10.5)
nRBC: 0 % (ref 0.0–0.2)

## 2020-07-29 LAB — ECHOCARDIOGRAM COMPLETE
Height: 64 in
S' Lateral: 3 cm
Weight: 3121.71 oz

## 2020-07-29 LAB — PROTIME-INR
INR: 2.4 — ABNORMAL HIGH (ref 0.8–1.2)
Prothrombin Time: 25.7 seconds — ABNORMAL HIGH (ref 11.4–15.2)

## 2020-07-29 MED ORDER — DILTIAZEM HCL 25 MG/5ML IV SOLN
10.0000 mg | Freq: Once | INTRAVENOUS | Status: AC
  Administered 2020-07-29: 10 mg via INTRAVENOUS
  Filled 2020-07-29: qty 5

## 2020-07-29 MED ORDER — AMOXICILLIN 500 MG PO CAPS
500.0000 mg | ORAL_CAPSULE | Freq: Three times a day (TID) | ORAL | Status: DC
  Administered 2020-07-29 – 2020-08-09 (×33): 500 mg via ORAL
  Filled 2020-07-29 (×35): qty 1

## 2020-07-29 MED ORDER — DILTIAZEM HCL-DEXTROSE 125-5 MG/125ML-% IV SOLN (PREMIX)
5.0000 mg/h | INTRAVENOUS | Status: AC
  Administered 2020-07-29: 15 mg/h via INTRAVENOUS
  Filled 2020-07-29 (×4): qty 125

## 2020-07-29 MED ORDER — METOPROLOL TARTRATE 50 MG PO TABS
50.0000 mg | ORAL_TABLET | Freq: Two times a day (BID) | ORAL | Status: DC
  Administered 2020-07-29 – 2020-08-12 (×29): 50 mg via ORAL
  Filled 2020-07-29 (×29): qty 1

## 2020-07-29 MED ORDER — POLYETHYLENE GLYCOL 3350 17 G PO PACK
17.0000 g | PACK | Freq: Every day | ORAL | Status: DC
  Administered 2020-07-29 – 2020-08-11 (×5): 17 g via ORAL
  Filled 2020-07-29 (×14): qty 1

## 2020-07-29 MED ORDER — WARFARIN SODIUM 2.5 MG PO TABS
2.5000 mg | ORAL_TABLET | Freq: Once | ORAL | Status: AC
  Administered 2020-07-29: 2.5 mg via ORAL
  Filled 2020-07-29: qty 1

## 2020-07-29 MED ORDER — SENNOSIDES-DOCUSATE SODIUM 8.6-50 MG PO TABS
2.0000 | ORAL_TABLET | Freq: Two times a day (BID) | ORAL | Status: DC
  Administered 2020-07-29 – 2020-07-31 (×7): 2 via ORAL
  Filled 2020-07-29 (×7): qty 2

## 2020-07-29 NOTE — Progress Notes (Signed)
   07/29/20 0742  Assess: MEWS Score  Temp 99.7 F (37.6 C)  BP (!) 132/93  Pulse Rate (!) 134  ECG Heart Rate (!) 134  Resp (!) 24  Level of Consciousness Alert  SpO2 96 %  O2 Device Nasal Cannula  O2 Flow Rate (L/min) 2.5 L/min  Assess: MEWS Score  MEWS Temp 0  MEWS Systolic 0  MEWS Pulse 3  MEWS RR 1  MEWS LOC 0  MEWS Score 4  MEWS Score Color Red  Assess: if the MEWS score is Yellow or Red  Were vital signs taken at a resting state? Yes  Focused Assessment No change from prior assessment  Early Detection of Sepsis Score *See Row Information* Low  MEWS guidelines implemented *See Row Information* Yes  Treat  MEWS Interventions Administered scheduled meds/treatments;Administered prn meds/treatments  Pain Scale 0-10  Pain Score 0  Take Vital Signs  Increase Vital Sign Frequency  Red: Q 1hr X 4 then Q 4hr X 4, if remains red, continue Q 4hrs  Escalate  MEWS: Escalate Red: discuss with charge nurse/RN and provider, consider discussing with RRT  Notify: Charge Nurse/RN  Name of Charge Nurse/RN Notified Nona Dell  Date Charge Nurse/RN Notified 07/29/20  Time Charge Nurse/RN Notified 0800  Notify: Provider  Provider Name/Title Irene Pap  Date Provider Notified 07/29/20  Time Provider Notified [phone]  Notification Type Call  Notification Reason Other (Comment) (discuss heart rate management )  Response No new orders (give prn IV metoprolol)  Date of Provider Response 07/29/20  Time of Provider Response [phone]  Document  Patient Outcome Stabilized after interventions  Progress note created (see row info) Yes   Plan to start cardizem infusion if rate not controlled with po and IV metoprolol.

## 2020-07-29 NOTE — Progress Notes (Addendum)
PROGRESS NOTE  [name] [name] [facility] JJK:[id number] DOB: [birth year] DOA: 07/25/2020 PCP: Hoyt Koch, MD  HPI/Recap of past 43 hours: 86 year old Caucasian female with a past medical history of permanent atrial fibrillation on warfarin, chronic diastolic CHF, essential hypertension, hyperlipidemia and chronic anxiety who comes in with left leg pain nausea vomiting and diarrhea.  Met SIRS criteria and noted to have left lower extremity cellulitis.  Started on empiric IV antibiotics.  TRH asked to admit for management of sepsis secondary to left lower extremity cellulitis.   Hospital course complicated by A. fib with RVR refractory to oral and IV beta-blocker.  Received a bolus of Cardizem infusion and was started on Cardizem drip.  Cardiology consulted on 07/29/2020 to assist with the management. Infectious disease has been consulted at patient's daughter request.  07/29/20: Seen and examined at her bedside.  Weak appearing with mild conversational dyspnea.     Assessment/Plan: Principal Problem:   Sepsis ([place]) Active Problems:   Hyperlipidemia   Chronic diastolic heart failure (HCC)   Left leg cellulitis   Atrial fibrillation with rapid ventricular response (HCC)   Hypotension   Lung nodule   Pressure injury of skin  Severe sepsis, resolved, secondary to left lower extremity cellulitis/strep group C bacteremia Presented with lactic acidosis, 6.7, tachycardia, fever and left lower extremity cellulitis. Initially on IV vancomycin, cefepime and Flagyl. Blood cultures drawn on 07/25/2020 + for Streptococcus group C Lactic acidosis has resolved, afebrile Cefepime switched to Rocephin on 07/26/2020, Rocephin switched to unasyn on 07/28/20. No DVT noted on Doppler studies.   Left lower extremity seems to be improving gradually.  Infectious disease consulted at patient's daughter's request. Started on Augmentin on 07/29/2020, appreciate IDs assistance. Continue to monitor fever curve and  WBC Repeat CBC with differentials in the morning  Strep groupC bacteremia, likely from skin source, left lower extremity cellulitis. From blood culture drawn on 07/25/2020, positive for Streptococcus group C, pansensitive Management as stated above  Permanent A. fib with RVR suspect related to lung physiology Currently is on oral Lopressor 50 mg twice daily Received IV Cardizem bolus this morning and started on Cardizem drip Cardiology consulted to assist with the management She is on warfarin for CVA prevention with therapeutic INR 2.4 TSH normal 2D echo ordered and pending Continue to closely monitor on telemetry  Acute hypoxic respiratory failure secondary to pulmonary edema seen on chest x-ray, likely from volume overload BNP mildly elevated greater than 300 Not on oxygen supplementation at baseline Follow 2D echo results Continue to maintain O2 saturation greater than 92% Will need home oxygen evaluation prior to DC  Acute transaminitis possibly secondary to hepatic congestion from volume overload Post cholecystectomy No acute findings on CT abdomen and pelvis done on 07/25/2020 Repeat CMP in the morning  Resolved AKI Baseline creatinine appears to be 0.6 with GFR greater than 60 Presented with creatinine of 1.1 She is back to her baseline creatinine Continue to avoid nephrotoxins Monitor urine output 1.3 L urine output recorded in the last 24 hours  Constipation Bowel regimen in place, Senokot, MiraLAX, Dulcolax  Resolved post repletion: Hypokalemia and hypomagnesemia Serum potassium 4.1, serum magnesium 2.2. Replete as indicated  Right upper lobe lung nodule Incidental finding.  Outpatient monitoring.  Hyperlipidemia Continue statin.  Recent eye surgery Resume home eyedrops  Physical debility PT recommended CIR CIR consulted for evaluation and possible placement Continue fall precautions  Stage 2 pressure injury of left buttock, POA Local wound  care    DVT  Prophylaxis:  Warfarin Code Status: Full code Family Communication: Discussed with the patient's daughter at bedside. Disposition Plan:     Status is: Inpatient    Dispo:  Patient From: Home  Planned Disposition: Inpatient Rehab  Expected discharge date: 07/31/20  Medically stable for discharge: No, ongoing management for left lower extremity cellulitis and permanent A. fib with RVR.         Objective: Vitals:   07/29/20 1131 07/29/20 1146 07/29/20 1216 07/29/20 1305  BP: 118/70 (!) 122/58 116/74 100/61  Pulse: (!) 104 91 95 88  Resp:    16  Temp:    98.4 F (36.9 C)  TempSrc:    Oral  SpO2: 95% 95% 94% 97%  Weight:      Height:        Intake/Output Summary (Last 24 hours) at 07/29/2020 1437 Last data filed at 07/29/2020 1300 Gross per 24 hour  Intake 800 ml  Output 2301 ml  Net -1501 ml   Filed Weights   07/26/20 1032 07/28/20 0700  Weight: 79.6 kg 88.5 kg    Exam:  . General: 86 y.o. year-old female well-developed well-nourished in no acute distress.  Alert and interactive.   . Cardiovascular: Irregular rate and rhythm no rubs or gallops.   Marland Kitchen Respiratory: Rales at bases no wheezing noted.   . Abdomen: Soft nontender bowel sounds present.   . Musculoskeletal: Left lower extremity erythematous, warm, skin borders are clearing.   Marland Kitchen Psychiatry: Mood is appropriate for condition and setting.   Data Reviewed: CBC: Recent Labs  Lab 07/25/20 1346 07/26/20 1155 07/27/20 0357 07/28/20 0507 07/29/20 0414  WBC 5.3 8.6 8.3 9.0 11.7*  NEUTROABS 3.8  --   --   --   --   HGB 16.4* 13.4 12.7 13.4 13.9  HCT 48.8* 40.2 38.0 41.6 42.6  MCV 91.2 91.8 91.8 93.7 92.6  PLT 180 171 145* 134* 502   Basic Metabolic Panel: Recent Labs  Lab 07/25/20 1346 07/26/20 1155 07/27/20 0357 07/28/20 0507 07/29/20 0414  NA 134* 135 136 136 136  K 3.6 3.1* 3.6 4.1 4.0  CL 99 102 104 105 101  CO2 20* 21* 22 20* 25  GLUCOSE 183* 108* 128* 116* 130*  BUN  _0 CREATININE 1.10* 0.85 0.74 0.63 0.64  CALCIUM 9.5 8.5* 8.6* 8.7* 8.8*  MG  --   --  1.6* 2.2  --    GFR: Estimated Creatinine Clearance: 54.3 mL/min (by C-G formula based on SCr of 0.64 mg/dL). Liver Function Tests: Recent Labs  Lab 07/25/20 1346 07/29/20 0414  AST 51* 129*  ALT 31 78*  ALKPHOS 88 135*  BILITOT QUANTITY NOT SUFFICIENT, UNABLE TO PERFORM TEST 1.3*  PROT 7.0 5.7*  ALBUMIN 3.4* 1.8*   No results for input(s): LIPASE, AMYLASE in the last 168 hours. No results for input(s): AMMONIA in the last 168 hours. Coagulation Profile: Recent Labs  Lab 07/25/20 1656 07/26/20 0546 07/27/20 0357 07/28/20 0507 07/29/20 0414  INR 2.9* 4.1* 3.7* 3.0* 2.4*   Cardiac Enzymes: No results for input(s): CKTOTAL, CKMB, CKMBINDEX, TROPONINI in the last 168 hours. BNP (last 3 results) No results for input(s): PROBNP in the last 8760 hours. HbA1C: No results for input(s): HGBA1C in the last 72 hours. CBG: No results for input(s): GLUCAP in the last 168 hours. Lipid Profile: No results for input(s): CHOL, HDL, LDLCALC, TRIG, CHOLHDL, LDLDIRECT in the last 72 hours. Thyroid Function Tests: Recent Labs  07/28/20 1915  TSH 3.776   Anemia Panel: No results for input(s): VITAMINB12, FOLATE, FERRITIN, TIBC, IRON, RETICCTPCT in the last 72 hours. Urine analysis:    Component Value Date/Time   COLORURINE YELLOW 07/25/2020 1758   APPEARANCEUR CLEAR 07/25/2020 1758   LABSPEC 1.018 07/25/2020 1758   PHURINE 6.0 07/25/2020 1758   GLUCOSEU NEGATIVE 07/25/2020 1758   HGBUR NEGATIVE 07/25/2020 1758   BILIRUBINUR NEGATIVE 07/25/2020 1758   KETONESUR NEGATIVE 07/25/2020 1758   PROTEINUR NEGATIVE 07/25/2020 1758   NITRITE NEGATIVE 07/25/2020 1758   LEUKOCYTESUR NEGATIVE 07/25/2020 1758   Sepsis Labs: _0 (procalcitonin:4,lacticidven:4)  ) Recent Results (from the past 240 hour(s))  Blood Culture (routine x 2)     Status: Abnormal   Collection Time:  07/25/20  3:30 PM   Specimen: BLOOD RIGHT HAND  Result Value Ref Range Status   Specimen Description BLOOD RIGHT HAND  Final   Special Requests   Final    BOTTLES DRAWN AEROBIC ONLY Blood Culture results may not be optimal due to an inadequate volume of blood received in culture bottles   Culture  Setup Time   Final    GRAM POSITIVE COCCI IN CHAINS AEROBIC BOTTLE ONLY CRITICAL RESULT CALLED TO, READ BACK BY AND VERIFIED WITH: A. Rogers Blocker PharmD 8:30 07/26/20 ([email or web address]) Performed at [facility], [place] [address], [place], [place] [address]    Culture STREPTOCOCCUS GROUP C (A)  Final   Report Status 07/28/2020 FINAL  Final   Organism ID, Bacteria STREPTOCOCCUS GROUP C  Final      Susceptibility   Streptococcus group c - MIC*    CLINDAMYCIN <=0.25 SENSITIVE Sensitive     AMPICILLIN <=0.25 SENSITIVE Sensitive     ERYTHROMYCIN <=0.12 SENSITIVE Sensitive     VANCOMYCIN 0.25 SENSITIVE Sensitive     CEFTRIAXONE <=0.12 SENSITIVE Sensitive     LEVOFLOXACIN 1 SENSITIVE Sensitive     * STREPTOCOCCUS GROUP C  Urine culture     Status: None   Collection Time: 07/25/20  3:30 PM   Specimen: In/Out Cath Urine  Result Value Ref Range Status   Specimen Description IN/OUT CATH URINE  Final   Special Requests NONE  Final   Culture   Final    NO GROWTH Performed at [facility], [place] [address] [place], [place] [address]    Report Status 07/26/2020 FINAL  Final  Blood Culture ID Panel (Reflexed)     Status: Abnormal   Collection Time: 07/25/20  3:30 PM  Result Value Ref Range Status   Enterococcus faecalis NOT DETECTED NOT DETECTED Final   Enterococcus Faecium NOT DETECTED NOT DETECTED Final   Listeria monocytogenes NOT DETECTED NOT DETECTED Final   Staphylococcus species NOT DETECTED NOT DETECTED Final   Staphylococcus aureus (BCID) NOT DETECTED NOT DETECTED Final   Staphylococcus epidermidis NOT DETECTED NOT DETECTED Final   Staphylococcus lugdunensis NOT DETECTED NOT DETECTED Final    Streptococcus species DETECTED (A) NOT DETECTED Final    Comment: Not Enterococcus species, Streptococcus agalactiae, Streptococcus pyogenes, or Streptococcus pneumoniae. CRITICAL RESULT CALLED TO, READ BACK BY AND VERIFIED WITH: A. Rogers Blocker PharmD 8:30 07/26/20 ([email or web address])    Streptococcus agalactiae NOT DETECTED NOT DETECTED Final   Streptococcus pneumoniae NOT DETECTED NOT DETECTED Final   Streptococcus pyogenes NOT DETECTED NOT DETECTED Final   A.calcoaceticus-baumannii NOT DETECTED NOT DETECTED Final   Bacteroides fragilis NOT DETECTED NOT DETECTED Final   Enterobacterales NOT DETECTED NOT DETECTED Final   Enterobacter cloacae complex NOT DETECTED NOT  DETECTED Final   Escherichia coli NOT DETECTED NOT DETECTED Final   Klebsiella aerogenes NOT DETECTED NOT DETECTED Final   Klebsiella oxytoca NOT DETECTED NOT DETECTED Final   Klebsiella pneumoniae NOT DETECTED NOT DETECTED Final   Proteus species NOT DETECTED NOT DETECTED Final   Salmonella species NOT DETECTED NOT DETECTED Final   Serratia marcescens NOT DETECTED NOT DETECTED Final   Haemophilus influenzae NOT DETECTED NOT DETECTED Final   Neisseria meningitidis NOT DETECTED NOT DETECTED Final   Pseudomonas aeruginosa NOT DETECTED NOT DETECTED Final   Stenotrophomonas maltophilia NOT DETECTED NOT DETECTED Final   Candida albicans NOT DETECTED NOT DETECTED Final   Candida auris NOT DETECTED NOT DETECTED Final   Candida glabrata NOT DETECTED NOT DETECTED Final   Candida krusei NOT DETECTED NOT DETECTED Final   Candida parapsilosis NOT DETECTED NOT DETECTED Final   Candida tropicalis NOT DETECTED NOT DETECTED Final   Cryptococcus neoformans/gattii NOT DETECTED NOT DETECTED Final    Comment: Performed at [facility], [place] [address] [place], [place] [address]  SARS Coronavirus 2 by RT PCR (hospital order, performed in [facility] hospital lab) Nasopharyngeal Nasopharyngeal Swab     Status: None   Collection Time: 07/25/20  3:33 PM    Specimen: Nasopharyngeal Swab  Result Value Ref Range Status   SARS Coronavirus 2 NEGATIVE NEGATIVE Final    Comment: (NOTE) SARS-CoV-2 target nucleic acids are NOT DETECTED.  The SARS-CoV-2 RNA is generally detectable in upper and lower respiratory specimens during the acute phase of infection. The lowest concentration of SARS-CoV-2 viral copies this assay can detect is 250 copies / mL. A negative result does not preclude SARS-CoV-2 infection and should not be used as the sole basis for treatment or other patient management decisions.  A negative result may occur with improper specimen collection / handling, submission of specimen other than nasopharyngeal swab, presence of viral mutation(s) within the areas targeted by this assay, and inadequate number of viral copies (<250 copies / mL). A negative result must be combined with clinical observations, patient history, and epidemiological information.  Fact Sheet for Patients:   StrictlyIdeas.no  Fact Sheet for Healthcare Providers: BankingDealers.co.za  This test is not yet approved or  cleared by the [place] FDA and has been authorized for detection and/or diagnosis of SARS-CoV-2 by FDA under an Emergency Use Authorization (EUA).  This EUA will remain in effect (meaning this test can be used) for the duration of the COVID-19 declaration under Section 564(b)(1) of the Act, 21 U.S.C. section 360bbb-3(b)(1), unless the authorization is terminated or revoked sooner.  Performed at [facility], [place] [address], [place], [place] [address]   MRSA PCR Screening     Status: None   Collection Time: 07/28/20  4:32 PM   Specimen: Nasal Mucosa; Nasopharyngeal  Result Value Ref Range Status   MRSA by PCR NEGATIVE NEGATIVE Final    Comment:        The GeneXpert MRSA Assay (FDA approved for NASAL specimens only), is one component of a comprehensive MRSA colonization surveillance  program. It is not intended to diagnose MRSA infection nor to guide or monitor treatment for MRSA infections. Performed at [facility], [place] [address] [place], [place] [address]       Studies: No results found.  Scheduled Meds: . amoxicillin  500 mg Oral Q8H  . atorvastatin  20 mg Oral q1800  . calcium-vitamin D  1 tablet Oral QODAY  . furosemide  20 mg Intravenous BID  . metoprolol tartrate  50 mg Oral BID  . warfarin  2.5 mg Oral ONCE-1600  . Warfarin - Pharmacist Dosing Inpatient   Does not apply q1600    Continuous Infusions: . diltiazem (CARDIZEM) infusion 15 mg/hr (07/29/20 0959)     LOS: 4 days     Kayleen Memos, MD [facility] Pager [phone]  If 7PM-7AM, please contact night-coverage [email or web address] Password [facility] 07/29/2020, 2:37 PM

## 2020-07-29 NOTE — Procedures (Signed)
Heart rate is too high for accurate echo at this time. 

## 2020-07-29 NOTE — Progress Notes (Signed)
  Echocardiogram 2D Echocardiogram has been performed.  [name] [name] 07/29/2020, 4:06 PM

## 2020-07-29 NOTE — Consult Note (Addendum)
ELECTROPHYSIOLOGY CONSULT NOTE    Patient ID: [name] [name] MRN: [id number], DOB/AGE: February 17, 1931 86 y.o.  Admit date: 07/25/2020 Date of Consult: 07/29/2020  Primary Physician: Hoyt Koch, MD Primary Cardiologist: No primary care provider on file.  Electrophysiologist: Dr. Lovena Le  Referring Provider: Dr. Nevada Crane  Patient Profile: [name] [name] is a 86 y.o. female with a history of AF, HTN, chronic diastolic CHF, essential HTN, HDL, and anxiety who is being seen today for the evaluation of afib with RVR at the request of Dr. Nevada Crane.  HPI:  [name] [name] is a 86 y.o. female with history above. She presented with left leg pain, nausea, vomiting, and diarrhea. She was found to be febrile with erythema and swelling of the left flank. She was admitted for sepsis secondary to LLE cellulitis. Blood culture positive for Streptococcus group C (pansensitive).  Management has been complicated by AF with RVR and cardiology asked to see today for further.   She does appear to have mild conversational dyspnea (unclear baseline). She is fatigued. She continues to have erythema, tenderness, and warmth.  Past Medical History:  Diagnosis Date   A-fib [facility])    paroxysmal   Aftercare for healing traumatic fracture of arm, unspecified    H/O: hemorrhoidectomy    H/O: hysterectomy    Hyperlipidemia    Hypertension    Malignant melanoma of skin of trunk, except scrotum (HCC)    Other postprocedural status(V45.89)    Rosacea    S/P cholecystectomy    Sciatica    right   Sprain of lumbar region      Surgical History:  Past Surgical History:  Procedure Laterality Date   ABDOMINAL HYSTERECTOMY     BLADDER SUSPENSION     never go surgery because it got canceled due to heart rate    cataract Bilateral 2019   catheter ablation of atrail flutter     CHOLECYSTECTOMY     HERNIA REPAIR       Medications Prior to Admission  Medication Sig Dispense Refill Last Dose   acetaminophen  (TYLENOL) 500 MG tablet Take 1,000 mg by mouth every 6 (six) hours as needed for headache (pain). For pain    07/25/2020 at early am   ALPRAZolam (XANAX) 0.25 MG tablet Take 1 tablet (0.25 mg total) by mouth daily as needed. for anxiety (Patient taking differently: Take 0.25 mg by mouth daily as needed for anxiety. ) 30 tablet 2 unknown   atorvastatin (LIPITOR) 20 MG tablet Take 1 tablet (20 mg total) by mouth daily at 6 PM. Annual appt due in Sept must see provider for future refills 90 tablet 1 07/24/2020 at pm   Calcium Carbonate-Vitamin D (CALCIUM-VITAMIN D) 500-200 MG-UNIT per tablet Take 1 tablet by mouth every other day.    8/28 or 8/29   Carboxymethylcellulose Sodium (THERATEARS OP) Place 1 drop into both eyes 2 (two) times daily as needed (dry eyes).   Past Week at Unknown time   ENSURE (ENSURE) Take 237 mLs by mouth daily as needed (meal supplement).    unknown   furosemide (LASIX) 40 MG tablet TAKE 1 TABLET BY MOUTH EVERY DAY (Patient taking differently: Take 40 mg by mouth at bedtime. 9pm) 90 tablet 1 07/24/2020 at pm   Hydrocortisone (GERHARDT'S BUTT CREAM) CREA Apply 1 application topically daily as needed for irritation.    unknown   metoprolol succinate (TOPROL-XL) 100 MG 24 hr tablet TAKE 1 DAILY, WITH OR IMMEDIATELY FOLLOWING A MEAL. KEEP UPCOMING  APPT WITH DR. Daisy (Patient taking differently: Take 100 mg by mouth daily. ) 90 tablet 3 07/25/2020 at 7:30am?   Multiple Vitamins-Minerals (MULTIVITAMIN WITH MINERALS) tablet Take 1 tablet by mouth daily at 6 PM.    07/24/2020 at pm   Multiple Vitamins-Minerals (PRESERVISION AREDS 2) CAPS Take 1 capsule by mouth in the morning and at bedtime.   07/24/2020 at pm   warfarin (COUMADIN) 2.5 MG tablet TAKE 1 TABLET DAILY EXCEPT 2 TABS ON MON AND FRI OR AS DIRECTED BY ANTICOAGULATION CLINIC (Patient taking differently: Take 2.5-5 mg by mouth See admin instructions. Take 2 tablets (5 mg) by mouth on Monday mornings, take 1 tablet (2.5 mg) on  all other mornings of the week - or as directed by anticoagulation clinic) 120 tablet 1 07/25/2020 at 7:30am?   Zinc Oxide (TRIPLE PASTE) 12.8 % ointment Apply 1 application topically as needed for irritation.   unknown    Inpatient Medications:   atorvastatin  20 mg Oral q1800   calcium-vitamin D  1 tablet Oral QODAY   furosemide  20 mg Intravenous BID   metoprolol tartrate  50 mg Oral BID   warfarin  2.5 mg Oral ONCE-1600   Warfarin - Pharmacist Dosing Inpatient   Does not apply q1600    Allergies:  Allergies  Allergen Reactions   Antihistamines, Diphenhydramine-Type Other (See Comments)    ALL ANTIHISTAMINES causes her heart to race   Oxycodone-Acetaminophen Nausea Only    Social History   Socioeconomic History   Marital status: Widowed    Spouse name: Not on file   Number of children: 6   Years of education: 10   Highest education level: Not on file  Occupational History   Occupation: Primary school teacher    Employer: RETIRED    Comment: retired  Tobacco Use   Smoking status: Never Smoker   Smokeless tobacco: Never Used  Substance and Sexual Activity   Alcohol use: No   Drug use: No   Sexual activity: Never  Other Topics Concern   Not on file  Social History Narrative   10th grade. Married '51.4 sons- '52, '54, '58, '70; 2 daughters- '56, '64 older girl died of ovarian cancer.11 grandchildren, 2 great grands. Retired Primary school teacher. ACP - wishes full code status: CPR, mechanical ventilator and all heroic measures.    Social Determinants of Health   Financial Resource Strain:    Difficulty of Paying Living Expenses: Not on file  Food Insecurity:    Worried About Charity fundraiser in the Last Year: Not on file   [employer] of Food in the Last Year: Not on file  Transportation Needs:    Lack of Transportation (Medical): Not on file   Lack of Transportation (Non-Medical): Not on file  Physical Activity:    Days of Exercise per Week: Not on file   Minutes of  Exercise per Session: Not on file  Stress:    Feeling of Stress : Not on file  Social Connections:    Frequency of Communication with Friends and Family: Not on file   Frequency of Social Gatherings with Friends and Family: Not on file   Attends Religious Services: Not on file   Active Member of Clubs or Organizations: Not on file   Attends Archivist Meetings: Not on file   Marital Status: Not on file  Intimate Partner Violence:    Fear of Current or Ex-Partner: Not on file   Emotionally Abused: Not on  file   Physically Abused: Not on file   Sexually Abused: Not on file     Family History  Problem Relation Age of Onset   Heart failure Mother    Heart failure Father    Liver cancer Daughter      Review of Systems: All other systems reviewed and are otherwise negative except as noted above.  Physical Exam: Vitals:   07/29/20 0049 07/29/20 0320 07/29/20 0444 07/29/20 0742  BP:  126/82 134/88   Pulse:   (!) 143 (!) 134  Resp:    (!) 24  Temp: 98 F (36.7 C)  99.3 F (37.4 C) 99.7 F (37.6 C)  TempSrc: Oral  Oral Axillary  SpO2:   97%   Weight:      Height:        GEN- The patient is elderly appearing, alert and oriented x 2 at least HEENT: normocephalic, atraumatic; sclera clear, conjunctiva pink; hearing intact; oropharynx clear; neck supple Lungs- Clear to ausculation bilaterally, normal work of breathing.  No wheezes, rales, rhonchi Heart- Irregular and very rapid rate and rhythm, no murmurs, rubs or gallops GI- soft, non-tender, non-distended, bowel sounds present Extremities- no clubbing, cyanosis, or edema; DP/PT/radial pulses 2+ bilaterally MS- no significant deformity or atrophy Skin- Chronic venous stasis changes. Bright red rash LLE nearly to knee Psych- flat but appropriate affect.   Labs:   Lab Results  Component Value Date   WBC 11.7 (H) 07/29/2020   HGB 13.9 07/29/2020   HCT 42.6 07/29/2020   MCV 92.6 07/29/2020   PLT 172 07/29/2020     Recent Labs  Lab 07/29/20 0414  NA 136  K 4.0  CL 101  CO2 25  BUN 11  CREATININE 0.64  CALCIUM 8.8*  PROT 5.7*  BILITOT 1.3*  ALKPHOS 135*  ALT 78*  AST 129*  GLUCOSE 130*      Radiology/Studies: CT Angio Chest PE W/Cm &/Or Wo Cm  Result Date: 07/25/2020 CLINICAL DATA:  PE suspected, high prob Left leg swelling and pain. EXAM: CT ANGIOGRAPHY CHEST WITH CONTRAST TECHNIQUE: Multidetector CT imaging of the chest was performed using the standard protocol during bolus administration of intravenous contrast. Multiplanar CT image reconstructions and MIPs were obtained to evaluate the vascular anatomy. Performed in conjunction with CT of the abdomen/pelvis, reported separately. CONTRAST:  47mL OMNIPAQUE IOHEXOL 350 MG/ML SOLN COMPARISON:  Radiograph earlier today. FINDINGS: Cardiovascular: There are no filling defects within the pulmonary arteries to suggest pulmonary embolus. Atherosclerosis of the thoracic aorta without aneurysm or dissection. Aorta is tortuous. Mild cardiomegaly, with particular left atrial enlargement. Minimal pericardial fluid without significant effusion. Coronary artery calcifications. Mediastinum/Nodes: Small right hilar nodes are subcentimeter and not enlarged by size criteria. There is no mediastinal or left hilar adenopathy. Patulous mid and upper esophagus. No esophageal wall thickening. No suspicious thyroid nodule. Lungs/Pleura: Diffusely heterogeneous pulmonary parenchyma. Sub solid nodular opacity in the right upper lobe measures 12 mm, series 4, image 51. Irregular subpleural opacities involving the right greater than left lung apex are typical of pleuroparenchymal scarring, however appears slightly nodular on the right. No septal thickening or findings of pulmonary edema. No pleural effusion. Scattered atelectasis in both dependent lower lobes. Excretory phase imaging limits bronchial assessment. Upper Abdomen: Assessed on concurrent abdominal CT, reported  separately. Musculoskeletal: Exaggerated upper thoracic kyphosis. Multilevel degenerative change in the spine. There are no acute or suspicious osseous abnormalities. Surgical hardware in the left proximal humerus. Review of the MIP images confirms the above  findings. IMPRESSION: 1. No pulmonary embolus. 2. Diffusely heterogeneous pulmonary parenchyma, can be seen with small vessel or small airways disease. 3. Sub solid ground-glass nodular opacity in the right upper lobe measures 12 mm. This is nonspecific, possibly infectious/inflammatory. Follow-up non-contrast CT recommended at 3-6 months to confirm persistence. If unchanged, and solid component remains <6 mm, annual CT is recommended until 5 years of stability has been established. If persistent these nodules should be considered highly suspicious if the solid component of the nodule is 6 mm or greater in size and enlarging. This recommendation follows the consensus statement: Guidelines for Management of Incidental Pulmonary Nodules Detected on CT Images: From the Fleischner Society 2017; Radiology 2017; 284:228-243. 4. Irregular subpleural opacities involving the right greater than left lung apex are typical of pleuroparenchymal scarring, however appears slightly nodular on the right. Recommend attention to this at follow-up. 5. Mild cardiomegaly with particular left atrial enlargement. Coronary artery calcifications. Aortic Atherosclerosis (ICD10-I70.0). Electronically Signed   By: Keith Rake M.D.   On: 07/25/2020 18:27   CT Abdomen Pelvis W Contrast  Result Date: 07/25/2020 CLINICAL DATA:  Acute nonlocalized abdominal pain. Left leg swelling and pain. EXAM: CT ABDOMEN AND PELVIS WITH CONTRAST TECHNIQUE: Multidetector CT imaging of the abdomen and pelvis was performed using the standard protocol following bolus administration of intravenous contrast. CONTRAST:  40mL OMNIPAQUE IOHEXOL 350 MG/ML SOLN COMPARISON:  No prior abdominal imaging.  Concurrent chest CT reviewed. FINDINGS: Lower chest: Assessed on concurrent chest CT. Cardiomegaly. Coronary artery calcifications. Hepatobiliary: Post cholecystectomy. Biliary prominence is likely normal post cholecystectomy. Common bile duct measures 8 mm at the porta hepatis. There is a coarse calcification involving the anterior subcapsular liver, partially obscured by breathing motion artifact. No underlying lesion is seen on delayed phase. No suspicious focal hepatic lesion. Pancreas: Parenchymal atrophy. No ductal dilatation or inflammation. Spleen: Normal in size without focal abnormality. Adrenals/Urinary Tract: No adrenal nodule. No hydronephrosis or perinephric edema. Homogeneous renal enhancement with symmetric excretion on delayed phase imaging. Possible nonobstructing stone in the lower left kidney versus early excretion of IV contrast. Urinary bladder is distended without wall thickening. Stomach/Bowel: Nondistended stomach. There is no small bowel obstruction or inflammatory change. High-riding cecum in the right upper quadrant. Normal appendix. Interposition of the colon anterior to the liver. Diffuse and multifocal colonic diverticulosis. Sigmoid colon is tortuous. No evidence of diverticulitis. Stool distends the rectum with possible rectocele. Vascular/Lymphatic: There prominent mildly enlarged lymph nodes in the left inguinal region measuring up to 14 mm. Prominent left external iliac nodes measure up to 9 mm. There is fat stranding adjacent to the external iliac and common femoral vessels. No discrete intraluminal filling defect in the venous structures. Arterial vascular calcifications without dissection, or acute arterial abnormality. Additional prominent left retroperitoneal nodes measure up to 8 mm the level of the iliac bifurcation. Aortic atherosclerosis and tortuosity. No aortic aneurysm. Patent portal vein. Reproductive: Status post hysterectomy. No adnexal masses. Left ovary  tentatively visualized and quiescent. Right ovary not definitively seen. Other: No ascites or free fluid. No free air. No abdominopelvic abscess. There is fat stranding involving the left anterior aspect of the thigh which tracks along the iliac vessels into the retroperitoneum. No soft tissue air. Musculoskeletal: Mild to moderate compression fractures of L3 and L4 as well as mild L5 compression fracture likely chronic. Bones are diffusely under mineralized. Diffuse multilevel degenerative change in the spine. No evidence of left hip joint effusion or bony destruction. Fat stranding involving the subcutaneous  tissues of the left inguinal region and anterior upper thigh without CT evidence of muscular involvement. IMPRESSION: 1. Fat stranding involving the left anterior aspect of the upper thigh which tracks along the iliac vessels into the retroperitoneum. No soft tissue air, focal fluid collection, or deep muscle involvement. There are also prominent left inguinal and external iliac nodes. Suspect left lower extremity cellulitis with reactive adenopathy, however clinical correlation is needed. 2. No lower extremity DVT or acute vascular findings. 3. Colonic diverticulosis without diverticulitis. Stool distends the rectum with possible rectocele. 4. Compression fractures of L3, L4, and L5 are likely chronic. Aortic Atherosclerosis (ICD10-I70.0). Electronically Signed   By: Keith Rake M.D.   On: 07/25/2020 18:36   DG Chest Port 1 View  Result Date: 07/27/2020 CLINICAL DATA:  Shortness of breath. EXAM: PORTABLE CHEST 1 VIEW COMPARISON:  07/25/2020. FINDINGS: Cardiomegaly with increased bilateral interstitial prominence. Bilateral pleural effusions. Findings consistent with CHF. Pneumonitis cannot be excluded. Postsurgical changes left humerus. IMPRESSION: Cardiomegaly with increased bilateral interstitial prominence and bilateral pleural effusions consistent with CHF. Pneumonitis cannot be excluded.  Electronically Signed   By: Marcello Moores  Register   On: 07/27/2020 12:10   DG Chest Port 1 View  Result Date: 07/25/2020 CLINICAL DATA:  Questionable sepsis - evaluate for abnormality Chest pain.  Leg edema. EXAM: PORTABLE CHEST 1 VIEW COMPARISON:  Chest radiograph 09/29/2017 FINDINGS: Patient is rotated. The chin obscures the left lung apex. Left costophrenic angle is not included in the field of view. Mild cardiomegaly. Aortic atherosclerosis. Questionable ill-defined left basilar opacity versus overlapping soft tissue structures. Mild vascular congestion no pneumothorax. Surgical hardware in the left proximal humerus. IMPRESSION: 1. Mild cardiomegaly and vascular congestion. 2. Questionable ill-defined left basilar opacity versus overlapping soft tissue structures. 3. Limited exam due to patient's chin obscuring the apices, rotation, and left costophrenic angle excluded from the field of view. PA and lateral views recommended when patient is able. Electronically Signed   By: Keith Rake M.D.   On: 07/25/2020 15:58   VAS [place] LOWER EXTREMITY VENOUS (DVT) (MC and WL 7a-7p)  Result Date: 07/25/2020  Lower Venous DVT Study Indications: Pain, and Swelling. Sepsis.  Risk Factors: Atrial fibrillation, therapeutic on Coumadin. Anticoagulation: Coumadin. Limitations: Patient's pain with touch and RN turned lights on in room in order to draw blood and start IV. Comparison Study: No prior study on file Performing Technologist: Sharion Dove RVS  Examination Guidelines: A complete evaluation includes B-mode imaging, spectral Doppler, color Doppler, and power Doppler as needed of all accessible portions of each vessel. Bilateral testing is considered an integral part of a complete examination. Limited examinations for reoccurring indications may be performed as noted. The reflux portion of the exam is performed with the patient in reverse Trendelenburg.   +-----+---------------+---------+-----------+----------+----------------------+ RIGHTCompressibilityPhasicitySpontaneityPropertiesThrombus Aging         +-----+---------------+---------+-----------+----------+----------------------+ CFV                                               patent by color and                                                      Doppler                +-----+---------------+---------+-----------+----------+----------------------+   +---------+---------------+---------+-----------+----------+-------------------+  LEFT     CompressibilityPhasicitySpontaneityPropertiesThrombus Aging      +---------+---------------+---------+-----------+----------+-------------------+ CFV      Full           Yes      Yes                                      +---------+---------------+---------+-----------+----------+-------------------+ SFJ      Full                                                             +---------+---------------+---------+-----------+----------+-------------------+ FV Prox                                               patent by color and                                                       Doppler             +---------+---------------+---------+-----------+----------+-------------------+ FV Mid                                                patent by color and                                                       Doppler             +---------+---------------+---------+-----------+----------+-------------------+ FV Distal                                             patent by color and                                                       Doppler             +---------+---------------+---------+-----------+----------+-------------------+ POP                                                   patent by color and  Doppler              +---------+---------------+---------+-----------+----------+-------------------+ PTV      Full                                                             +---------+---------------+---------+-----------+----------+-------------------+ PERO     Full                                                             +---------+---------------+---------+-----------+----------+-------------------+     Summary: RIGHT: - No evidence of common femoral vein obstruction. pulsatile waveforms  LEFT: - There is no evidence of deep vein thrombosis in the lower extremity. However, portions of this examination were limited- see technologist comments above.  Pulsatile waveforms - Ultrasound characteristics of enlarged lymph nodes noted in the groin.  *See table(s) above for measurements and observations. Electronically signed by Servando Snare MD on 07/25/2020 at 7:41:09 PM.    Final     EKG: yesterday shows AF at 159 bpm (personally reviewed)  TELEMETRY: AF with RVR 120s-170s (personally reviewed)  Assessment/Plan: 1.  AF with RVR Exacerbated in the setting of acute illness She is on coumadin chronically for CHA2DS2VASC of at least 5    Lopressor has been increased to 50 mg BID Now on diltiazem gtt, agree with this addition. She has been in atrial fibrillation for years by chart review. Likelihood of any successful attempts at cardioversion would be exceedingly low.  Suspect her rates will improve as diltiazem takes effect and her bacteremia is treated.  2. HTN Stable currently  3. Strep group C bacteremia, likely from LLE cellulitis Patient initially started on vancomycin cefepime and Flagyl.  Blood cultures growing strep group C, pansensitive.  Ceftriaxone switched to unasyn on 07/28/20. (would watch sodium load with unasyn and volume overload) Tmax 99.7  For questions or updates, please contact [place] Please consult [email or web address] for contact info under Cardiology/STEMI.  Jacalyn Lefevre, PA-C  07/29/2020 9:36 AM  Mrs. Zammit is well known to me with her atrial fib. She has been admitted with strep sepsis from cellulitis of her left lower leg. She has developed atrial fib with a RVR and probably some CHF. She has been placed on IV cardizem. She remains critically ill on exam. Her lower leg is still erythematous and she appears sick. For now would continue IV cardizem. If her rates remain uncontrolled, she could be given Digoxin 0.25 mg IV once or twice. Her anti-biotic regimen to be directed by her medical team. We will follow with you.   Carleene Overlie Ellon Marasco,MD

## 2020-07-29 NOTE — Consult Note (Signed)
[facility] for Infectious Disease       Reason for Consult: cellulitis    Referring Physician: Dr. Nevada Crane  Principal Problem:   Sepsis [facility]) Active Problems:   Hyperlipidemia   Chronic diastolic heart failure ([place])   Left leg cellulitis   Atrial fibrillation with RVR (HCC)   Hypotension   Lung nodule   . amoxicillin  500 mg Oral Q8H  . atorvastatin  20 mg Oral q1800  . calcium-vitamin D  1 tablet Oral QODAY  . furosemide  20 mg Intravenous BID  . metoprolol tartrate  50 mg Oral BID  . warfarin  2.5 mg Oral ONCE-1600  . Warfarin - Pharmacist Dosing Inpatient   Does not apply q1600    Recommendations: Amoxicillin  Stop cefazolin   Assessment: She has cellulitis of the left leg and pansensitive Group C Strep.  Cellulitis is improving since admission.  Initially with sepsis that is improved with normal lactate.     Antibiotics: Vancomycin, cefepime, Flagyl, ceftriaxone, cefazolin  HPI: [name] [name] is a 86 y.o. female with AF, CHF presented with several days of feeling poorly, including n/v/d.  Noted left leg pain and cellulitis on admission.  Some improvement of the cellulitis and SIRS pathology but some progression over the last 24 hours.  Tmax 100.6, has remained afebrile otherwise.  WBC max 11.7.  Family at bedside and daughter over phone who is an ID PharmD.     Review of Systems:  Constitutional: negative for fevers and chills Gastrointestinal: negative for nausea and diarrhea All other systems reviewed and are negative    Past Medical History:  Diagnosis Date  . A-fib ([place])    paroxysmal  . Aftercare for healing traumatic fracture of arm, unspecified   . H/O: hemorrhoidectomy   . H/O: hysterectomy   . Hyperlipidemia   . Hypertension   . Malignant melanoma of skin of trunk, except scrotum ([place])   . Other postprocedural status(V45.89)   . Rosacea   . S/P cholecystectomy   . Sciatica    right  . Sprain of lumbar region     Social History    Tobacco Use  . Smoking status: Never Smoker  . Smokeless tobacco: Never Used  Substance Use Topics  . Alcohol use: No  . Drug use: No    Family History  Problem Relation Age of Onset  . Heart failure Mother   . Heart failure Father   . Liver cancer Daughter     Allergies  Allergen Reactions  . Antihistamines, Diphenhydramine-Type Other (See Comments)    ALL ANTIHISTAMINES causes her heart to race  . Oxycodone-Acetaminophen Nausea Only    Physical Exam: Constitutional: in no apparent distress  Vitals:   07/29/20 1146 07/29/20 1216  BP: (!) 122/58 116/74  Pulse: 91 95  Resp:    Temp:    SpO2: 95% 94%   EYES: anicteric Cardiovascular: Cor RRR Respiratory: clear; Musculoskeletal: left leg with erythema, tenderness, warmth; regressed from previous markings that went up to mid thigh.  Skin: negatives: no rash Neuro: non-focal  Lab Results  Component Value Date   WBC 11.7 (H) 07/29/2020   HGB 13.9 07/29/2020   HCT 42.6 07/29/2020   MCV 92.6 07/29/2020   PLT 172 07/29/2020    Lab Results  Component Value Date   CREATININE 0.64 07/29/2020   BUN 11 07/29/2020   NA 136 07/29/2020   K 4.0 07/29/2020   CL 101 07/29/2020   CO2 25 07/29/2020  Lab Results  Component Value Date   ALT 78 (H) 07/29/2020   AST 129 (H) 07/29/2020   ALKPHOS 135 (H) 07/29/2020     Microbiology: Recent Results (from the past 240 hour(s))  Blood Culture (routine x 2)     Status: Abnormal   Collection Time: 07/25/20  3:30 PM   Specimen: BLOOD RIGHT HAND  Result Value Ref Range Status   Specimen Description BLOOD RIGHT HAND  Final   Special Requests   Final    BOTTLES DRAWN AEROBIC ONLY Blood Culture results may not be optimal due to an inadequate volume of blood received in culture bottles   Culture  Setup Time   Final    GRAM POSITIVE COCCI IN CHAINS AEROBIC BOTTLE ONLY CRITICAL RESULT CALLED TO, READ BACK BY AND VERIFIED WITH: A. Rogers Blocker PharmD 8:30 07/26/20 ([email or web address]) Performed  at [facility], [place] [address], [place], [place] [address]    Culture STREPTOCOCCUS GROUP C (A)  Final   Report Status 07/28/2020 FINAL  Final   Organism ID, Bacteria STREPTOCOCCUS GROUP C  Final      Susceptibility   Streptococcus group c - MIC*    CLINDAMYCIN <=0.25 SENSITIVE Sensitive     AMPICILLIN <=0.25 SENSITIVE Sensitive     ERYTHROMYCIN <=0.12 SENSITIVE Sensitive     VANCOMYCIN 0.25 SENSITIVE Sensitive     CEFTRIAXONE <=0.12 SENSITIVE Sensitive     LEVOFLOXACIN 1 SENSITIVE Sensitive     * STREPTOCOCCUS GROUP C  Urine culture     Status: None   Collection Time: 07/25/20  3:30 PM   Specimen: In/Out Cath Urine  Result Value Ref Range Status   Specimen Description IN/OUT CATH URINE  Final   Special Requests NONE  Final   Culture   Final    NO GROWTH Performed at [facility], [place] [address] [place], [place] [address]    Report Status 07/26/2020 FINAL  Final  Blood Culture ID Panel (Reflexed)     Status: Abnormal   Collection Time: 07/25/20  3:30 PM  Result Value Ref Range Status   Enterococcus faecalis NOT DETECTED NOT DETECTED Final   Enterococcus Faecium NOT DETECTED NOT DETECTED Final   Listeria monocytogenes NOT DETECTED NOT DETECTED Final   Staphylococcus species NOT DETECTED NOT DETECTED Final   Staphylococcus aureus (BCID) NOT DETECTED NOT DETECTED Final   Staphylococcus epidermidis NOT DETECTED NOT DETECTED Final   Staphylococcus lugdunensis NOT DETECTED NOT DETECTED Final   Streptococcus species DETECTED (A) NOT DETECTED Final    Comment: Not Enterococcus species, Streptococcus agalactiae, Streptococcus pyogenes, or Streptococcus pneumoniae. CRITICAL RESULT CALLED TO, READ BACK BY AND VERIFIED WITH: A. Rogers Blocker PharmD 8:30 07/26/20 ([email or web address])    Streptococcus agalactiae NOT DETECTED NOT DETECTED Final   Streptococcus pneumoniae NOT DETECTED NOT DETECTED Final   Streptococcus pyogenes NOT DETECTED NOT DETECTED Final   A.calcoaceticus-baumannii NOT  DETECTED NOT DETECTED Final   Bacteroides fragilis NOT DETECTED NOT DETECTED Final   Enterobacterales NOT DETECTED NOT DETECTED Final   Enterobacter cloacae complex NOT DETECTED NOT DETECTED Final   Escherichia coli NOT DETECTED NOT DETECTED Final   Klebsiella aerogenes NOT DETECTED NOT DETECTED Final   Klebsiella oxytoca NOT DETECTED NOT DETECTED Final   Klebsiella pneumoniae NOT DETECTED NOT DETECTED Final   Proteus species NOT DETECTED NOT DETECTED Final   Salmonella species NOT DETECTED NOT DETECTED Final   Serratia marcescens NOT DETECTED NOT DETECTED Final   Haemophilus influenzae NOT DETECTED NOT DETECTED Final  Neisseria meningitidis NOT DETECTED NOT DETECTED Final   Pseudomonas aeruginosa NOT DETECTED NOT DETECTED Final   Stenotrophomonas maltophilia NOT DETECTED NOT DETECTED Final   Candida albicans NOT DETECTED NOT DETECTED Final   Candida auris NOT DETECTED NOT DETECTED Final   Candida glabrata NOT DETECTED NOT DETECTED Final   Candida krusei NOT DETECTED NOT DETECTED Final   Candida parapsilosis NOT DETECTED NOT DETECTED Final   Candida tropicalis NOT DETECTED NOT DETECTED Final   Cryptococcus neoformans/gattii NOT DETECTED NOT DETECTED Final    Comment: Performed at [facility], [place] [address] [place], [place] [address]  SARS Coronavirus 2 by RT PCR (hospital order, performed in [facility] hospital lab) Nasopharyngeal Nasopharyngeal Swab     Status: None   Collection Time: 07/25/20  3:33 PM   Specimen: Nasopharyngeal Swab  Result Value Ref Range Status   SARS Coronavirus 2 NEGATIVE NEGATIVE Final    Comment: (NOTE) SARS-CoV-2 target nucleic acids are NOT DETECTED.  The SARS-CoV-2 RNA is generally detectable in upper and lower respiratory specimens during the acute phase of infection. The lowest concentration of SARS-CoV-2 viral copies this assay can detect is 250 copies / mL. A negative result does not preclude SARS-CoV-2 infection and should not be used as  the sole basis for treatment or other patient management decisions.  A negative result may occur with improper specimen collection / handling, submission of specimen other than nasopharyngeal swab, presence of viral mutation(s) within the areas targeted by this assay, and inadequate number of viral copies (<250 copies / mL). A negative result must be combined with clinical observations, patient history, and epidemiological information.  Fact Sheet for Patients:   StrictlyIdeas.no  Fact Sheet for Healthcare Providers: BankingDealers.co.za  This test is not yet approved or  cleared by the [place] FDA and has been authorized for detection and/or diagnosis of SARS-CoV-2 by FDA under an Emergency Use Authorization (EUA).  This EUA will remain in effect (meaning this test can be used) for the duration of the COVID-19 declaration under Section 564(b)(1) of the Act, 21 U.S.C. section 360bbb-3(b)(1), unless the authorization is terminated or revoked sooner.  Performed at [facility], [place] [address] [place], [place] [address]   MRSA PCR Screening     Status: None   Collection Time: 07/28/20  4:32 PM   Specimen: Nasal Mucosa; Nasopharyngeal  Result Value Ref Range Status   MRSA by PCR NEGATIVE NEGATIVE Final    Comment:        The GeneXpert MRSA Assay (FDA approved for NASAL specimens only), is one component of a comprehensive MRSA colonization surveillance program. It is not intended to diagnose MRSA infection nor to guide or monitor treatment for MRSA infections. Performed at [facility], [place] [address] [place], [place] [address]     Lynzy Rawles W Uriel Horkey, [place] for Infectious Disease [facility] [email or web address] 07/29/2020, 1:12 PM

## 2020-07-29 NOTE — Progress Notes (Signed)
[facility] for Warfarin  Indication: atrial fibrillation  Allergies  Allergen Reactions  . Antihistamines, Diphenhydramine-Type Other (See Comments)    ALL ANTIHISTAMINES causes her heart to race  . Oxycodone-Acetaminophen Nausea Only    Patient Measurements: Height: 5\' 4"  (162.6 cm) Weight: 88.5 kg (195 lb 1.7 oz) IBW/kg (Calculated) : 54.7 Heparin Dosing Weight:   Vital Signs: Temp: 99.3 F (37.4 C) (09/02 0444) Temp Source: Oral (09/02 0444) BP: 134/88 (09/02 0444) Pulse Rate: 143 (09/02 0444)  Labs: Recent Labs    07/27/20 0357 07/27/20 0357 07/28/20 0507 07/29/20 0414  HGB 12.7   < > 13.4 13.9  HCT 38.0  --  41.6 42.6  PLT 145*  --  134* 172  LABPROT 35.2*  --  30.5* 25.7*  INR 3.7*  --  3.0* 2.4*  CREATININE 0.74  --  0.63 0.64   < > = values in this interval not displayed.    Estimated Creatinine Clearance: 54.3 mL/min (by C-G formula based on SCr of 0.64 mg/dL).   Medical History: Past Medical History:  Diagnosis Date  . A-fib ([place])    paroxysmal  . Aftercare for healing traumatic fracture of arm, unspecified   . H/O: hemorrhoidectomy   . H/O: hysterectomy   . Hyperlipidemia   . Hypertension   . Malignant melanoma of skin of trunk, except scrotum ([place])   . Other postprocedural status(V45.89)   . Rosacea   . S/P cholecystectomy   . Sciatica    right  . Sprain of lumbar region     Medications:  Scheduled:  . atorvastatin  20 mg Oral q1800  . calcium-vitamin D  1 tablet Oral QODAY  . furosemide  20 mg Intravenous BID  . metoprolol tartrate  50 mg Oral BID  . Warfarin - Pharmacist Dosing Inpatient   Does not apply q1600    Assessment: Patient is a 77 yof that presents to the ED with N/V/D. The patient was found to be septic and started on broad spectrum antibiotics. The patient takes warfarin PTA for afib. Pharmacy has been asked to continue dosing warfarin while inpatient.   Warfarin PTA dose: 5mg  on  Monday and 2.5 ROW   INR therapeutic at 2.4 this morning, CBC wnl, LFTs mildly elevated.  Goal of Therapy:  INR 2-3 Monitor platelets by anticoagulation protocol: Yes   Plan:  Warfarin 2.5mg  PO x1 tonight Daily protime   Arrie Senate, PharmD, BCPS Clinical Pharmacist [phone] Please check AMION for all [place] numbers 07/29/2020

## 2020-07-30 LAB — CBC WITH DIFFERENTIAL/PLATELET
Abs Immature Granulocytes: 0.75 10*3/uL — ABNORMAL HIGH (ref 0.00–0.07)
Basophils Absolute: 0.1 10*3/uL (ref 0.0–0.1)
Basophils Relative: 1 %
Eosinophils Absolute: 0.2 10*3/uL (ref 0.0–0.5)
Eosinophils Relative: 1 %
HCT: 40.6 % (ref 36.0–46.0)
Hemoglobin: 13.1 g/dL (ref 12.0–15.0)
Immature Granulocytes: 5 %
Lymphocytes Relative: 8 %
Lymphs Abs: 1.3 10*3/uL (ref 0.7–4.0)
MCH: 29.8 pg (ref 26.0–34.0)
MCHC: 32.3 g/dL (ref 30.0–36.0)
MCV: 92.3 fL (ref 80.0–100.0)
Monocytes Absolute: 0.5 10*3/uL (ref 0.1–1.0)
Monocytes Relative: 3 %
Neutro Abs: 12.8 10*3/uL — ABNORMAL HIGH (ref 1.7–7.7)
Neutrophils Relative %: 82 %
Platelets: 239 10*3/uL (ref 150–400)
RBC: 4.4 MIL/uL (ref 3.87–5.11)
RDW: 14.1 % (ref 11.5–15.5)
WBC: 15.7 10*3/uL — ABNORMAL HIGH (ref 4.0–10.5)
nRBC: 0 % (ref 0.0–0.2)

## 2020-07-30 LAB — COMPREHENSIVE METABOLIC PANEL
ALT: 118 U/L — ABNORMAL HIGH (ref 0–44)
AST: 277 U/L — ABNORMAL HIGH (ref 15–41)
Albumin: 1.8 g/dL — ABNORMAL LOW (ref 3.5–5.0)
Alkaline Phosphatase: 195 U/L — ABNORMAL HIGH (ref 38–126)
Anion gap: 11 (ref 5–15)
BUN: 14 mg/dL (ref 8–23)
CO2: 24 mmol/L (ref 22–32)
Calcium: 8.7 mg/dL — ABNORMAL LOW (ref 8.9–10.3)
Chloride: 99 mmol/L (ref 98–111)
Creatinine, Ser: 0.62 mg/dL (ref 0.44–1.00)
GFR calc Af Amer: >60 mL/min (ref >60)
GFR calc non Af Amer: >60 mL/min (ref >60)
Glucose, Bld: 136 mg/dL — ABNORMAL HIGH (ref 70–99)
Potassium: 4 mmol/L (ref 3.5–5.1)
Sodium: 134 mmol/L — ABNORMAL LOW (ref 135–145)
Total Bilirubin: 1.6 mg/dL — ABNORMAL HIGH (ref 0.3–1.2)
Total Protein: 6.1 g/dL — ABNORMAL LOW (ref 6.5–8.1)

## 2020-07-30 LAB — PROTIME-INR
INR: 2.7 — ABNORMAL HIGH (ref 0.8–1.2)
Prothrombin Time: 27.9 seconds — ABNORMAL HIGH (ref 11.4–15.2)

## 2020-07-30 MED ORDER — DILTIAZEM HCL 60 MG PO TABS
30.0000 mg | ORAL_TABLET | Freq: Four times a day (QID) | ORAL | Status: DC
  Administered 2020-07-31 – 2020-08-02 (×9): 30 mg via ORAL
  Filled 2020-07-30 (×9): qty 1

## 2020-07-30 MED ORDER — CALCIUM CARBONATE-VITAMIN D 500-200 MG-UNIT PO TABS
1.0000 | ORAL_TABLET | Freq: Every day | ORAL | Status: DC
  Administered 2020-07-31: 22:00:00 1 via ORAL
  Filled 2020-07-30 (×3): qty 1

## 2020-07-30 MED ORDER — MAGIC MOUTHWASH W/LIDOCAINE
2.0000 mL | Freq: Three times a day (TID) | ORAL | Status: DC | PRN
  Administered 2020-07-30: 2 mL via ORAL
  Filled 2020-07-30 (×2): qty 5

## 2020-07-30 MED ORDER — WARFARIN SODIUM 2.5 MG PO TABS
2.5000 mg | ORAL_TABLET | Freq: Once | ORAL | Status: AC
  Administered 2020-07-30: 2.5 mg via ORAL
  Filled 2020-07-30: qty 1

## 2020-07-30 MED ORDER — ATORVASTATIN CALCIUM 10 MG PO TABS
20.0000 mg | ORAL_TABLET | Freq: Every day | ORAL | Status: DC
  Administered 2020-07-30: 20 mg via ORAL
  Filled 2020-07-30: qty 2

## 2020-07-30 MED ORDER — DM-GUAIFENESIN ER 30-600 MG PO TB12
2.0000 | ORAL_TABLET | Freq: Two times a day (BID) | ORAL | Status: DC
  Administered 2020-07-30 – 2020-08-12 (×27): 2 via ORAL
  Filled 2020-07-30: qty 1
  Filled 2020-07-30 (×13): qty 2
  Filled 2020-07-30 (×2): qty 1
  Filled 2020-07-30 (×12): qty 2

## 2020-07-30 MED ORDER — LEVALBUTEROL HCL 0.63 MG/3ML IN NEBU
0.6300 mg | INHALATION_SOLUTION | Freq: Three times a day (TID) | RESPIRATORY_TRACT | Status: DC
  Administered 2020-07-30 (×2): 0.63 mg via RESPIRATORY_TRACT
  Filled 2020-07-30 (×2): qty 3

## 2020-07-30 MED ORDER — MINERAL OIL RE ENEM
1.0000 | ENEMA | Freq: Once | RECTAL | Status: DC
  Filled 2020-07-30: qty 1

## 2020-07-30 MED ORDER — SODIUM CHLORIDE 3 % IN NEBU
4.0000 mL | INHALATION_SOLUTION | Freq: Two times a day (BID) | RESPIRATORY_TRACT | Status: AC
  Administered 2020-07-30 – 2020-08-01 (×5): 4 mL via RESPIRATORY_TRACT
  Filled 2020-07-30 (×7): qty 4

## 2020-07-30 MED ORDER — IPRATROPIUM BROMIDE 0.02 % IN SOLN
0.5000 mg | Freq: Three times a day (TID) | RESPIRATORY_TRACT | Status: DC
  Administered 2020-07-30 (×2): 0.5 mg via RESPIRATORY_TRACT
  Filled 2020-07-30 (×3): qty 2.5

## 2020-07-30 MED ORDER — BISACODYL 10 MG RE SUPP
10.0000 mg | Freq: Every day | RECTAL | Status: DC | PRN
  Administered 2020-07-31 – 2020-08-03 (×2): 10 mg via RECTAL
  Filled 2020-07-30 (×5): qty 1

## 2020-07-30 NOTE — Progress Notes (Signed)
[facility] for Infectious Disease   Reason for visit: Follow up on cellulitis  Interval History: improvement of leg cellulitis, afebrile; no actue events.  Daughter at bedside and pleased with response to amoxicillin   Physical Exam: Constitutional:  Vitals:   07/30/20 1009 07/30/20 1211  BP: 111/66 112/64  Pulse: 94 81  Resp:  19  Temp:  97.9 F (36.6 C)  SpO2: 94% 96%   patient appears in NAD Respiratory: Normal respiratory effort; CTA B Cardiovascular: RRR GI: soft, nt, nd MS: left leg with decreased erythema and decreased warmth; right leg with chronic venous stasis changes.   Review of Systems: Constitutional: negative for fevers and chills Gastrointestinal: negative for nausea and diarrhea  Lab Results  Component Value Date   WBC 11.7 (H) 07/29/2020   HGB 13.9 07/29/2020   HCT 42.6 07/29/2020   MCV 92.6 07/29/2020   PLT 172 07/29/2020    Lab Results  Component Value Date   CREATININE 0.64 07/29/2020   BUN 11 07/29/2020   NA 136 07/29/2020   K 4.0 07/29/2020   CL 101 07/29/2020   CO2 25 07/29/2020    Lab Results  Component Value Date   ALT 78 (H) 07/29/2020   AST 129 (H) 07/29/2020   ALKPHOS 135 (H) 07/29/2020     Microbiology: Recent Results (from the past 240 hour(s))  Blood Culture (routine x 2)     Status: Abnormal   Collection Time: 07/25/20  3:30 PM   Specimen: BLOOD RIGHT HAND  Result Value Ref Range Status   Specimen Description BLOOD RIGHT HAND  Final   Special Requests   Final    BOTTLES DRAWN AEROBIC ONLY Blood Culture results may not be optimal due to an inadequate volume of blood received in culture bottles   Culture  Setup Time   Final    GRAM POSITIVE COCCI IN CHAINS AEROBIC BOTTLE ONLY CRITICAL RESULT CALLED TO, READ BACK BY AND VERIFIED WITH: A. Rogers Blocker PharmD 8:30 07/26/20 ([email or web address]) Performed at [facility], [place] [address] [place], [place] [address]    Culture STREPTOCOCCUS GROUP C (A)  Final   Report Status  07/28/2020 FINAL  Final   Organism ID, Bacteria STREPTOCOCCUS GROUP C  Final      Susceptibility   Streptococcus group c - MIC*    CLINDAMYCIN <=0.25 SENSITIVE Sensitive     AMPICILLIN <=0.25 SENSITIVE Sensitive     ERYTHROMYCIN <=0.12 SENSITIVE Sensitive     VANCOMYCIN 0.25 SENSITIVE Sensitive     CEFTRIAXONE <=0.12 SENSITIVE Sensitive     LEVOFLOXACIN 1 SENSITIVE Sensitive     * STREPTOCOCCUS GROUP C  Urine culture     Status: None   Collection Time: 07/25/20  3:30 PM   Specimen: In/Out Cath Urine  Result Value Ref Range Status   Specimen Description IN/OUT CATH URINE  Final   Special Requests NONE  Final   Culture   Final    NO GROWTH Performed at [facility], [place] [address], [place], [place] [address]    Report Status 07/26/2020 FINAL  Final  Blood Culture ID Panel (Reflexed)     Status: Abnormal   Collection Time: 07/25/20  3:30 PM  Result Value Ref Range Status   Enterococcus faecalis NOT DETECTED NOT DETECTED Final   Enterococcus Faecium NOT DETECTED NOT DETECTED Final   Listeria monocytogenes NOT DETECTED NOT DETECTED Final   Staphylococcus species NOT DETECTED NOT DETECTED Final   Staphylococcus aureus (BCID) NOT  DETECTED NOT DETECTED Final   Staphylococcus epidermidis NOT DETECTED NOT DETECTED Final   Staphylococcus lugdunensis NOT DETECTED NOT DETECTED Final   Streptococcus species DETECTED (A) NOT DETECTED Final    Comment: Not Enterococcus species, Streptococcus agalactiae, Streptococcus pyogenes, or Streptococcus pneumoniae. CRITICAL RESULT CALLED TO, READ BACK BY AND VERIFIED WITH: A. Rogers Blocker PharmD 8:30 07/26/20 ([email or web address])    Streptococcus agalactiae NOT DETECTED NOT DETECTED Final   Streptococcus pneumoniae NOT DETECTED NOT DETECTED Final   Streptococcus pyogenes NOT DETECTED NOT DETECTED Final   A.calcoaceticus-baumannii NOT DETECTED NOT DETECTED Final   Bacteroides fragilis NOT DETECTED NOT DETECTED Final   Enterobacterales NOT DETECTED NOT DETECTED  Final   Enterobacter cloacae complex NOT DETECTED NOT DETECTED Final   Escherichia coli NOT DETECTED NOT DETECTED Final   Klebsiella aerogenes NOT DETECTED NOT DETECTED Final   Klebsiella oxytoca NOT DETECTED NOT DETECTED Final   Klebsiella pneumoniae NOT DETECTED NOT DETECTED Final   Proteus species NOT DETECTED NOT DETECTED Final   Salmonella species NOT DETECTED NOT DETECTED Final   Serratia marcescens NOT DETECTED NOT DETECTED Final   Haemophilus influenzae NOT DETECTED NOT DETECTED Final   Neisseria meningitidis NOT DETECTED NOT DETECTED Final   Pseudomonas aeruginosa NOT DETECTED NOT DETECTED Final   Stenotrophomonas maltophilia NOT DETECTED NOT DETECTED Final   Candida albicans NOT DETECTED NOT DETECTED Final   Candida auris NOT DETECTED NOT DETECTED Final   Candida glabrata NOT DETECTED NOT DETECTED Final   Candida krusei NOT DETECTED NOT DETECTED Final   Candida parapsilosis NOT DETECTED NOT DETECTED Final   Candida tropicalis NOT DETECTED NOT DETECTED Final   Cryptococcus neoformans/gattii NOT DETECTED NOT DETECTED Final    Comment: Performed at [facility], [address] [place], [place] [address]  SARS Coronavirus 2 by RT PCR (hospital order, performed in [facility] hospital lab) Nasopharyngeal Nasopharyngeal Swab     Status: None   Collection Time: 07/25/20  3:33 PM   Specimen: Nasopharyngeal Swab  Result Value Ref Range Status   SARS Coronavirus 2 NEGATIVE NEGATIVE Final    Comment: (NOTE) SARS-CoV-2 target nucleic acids are NOT DETECTED.  The SARS-CoV-2 RNA is generally detectable in upper and lower respiratory specimens during the acute phase of infection. The lowest concentration of SARS-CoV-2 viral copies this assay can detect is 250 copies / mL. A negative result does not preclude SARS-CoV-2 infection and should not be used as the sole basis for treatment or other patient management decisions.  A negative result may occur with improper specimen  collection / handling, submission of specimen other than nasopharyngeal swab, presence of viral mutation(s) within the areas targeted by this assay, and inadequate number of viral copies (<250 copies / mL). A negative result must be combined with clinical observations, patient history, and epidemiological information.  Fact Sheet for Patients:   StrictlyIdeas.no  Fact Sheet for Healthcare Providers: BankingDealers.co.za  This test is not yet approved or  cleared by the [place] FDA and has been authorized for detection and/or diagnosis of SARS-CoV-2 by FDA under an Emergency Use Authorization (EUA).  This EUA will remain in effect (meaning this test can be used) for the duration of the COVID-19 declaration under Section 564(b)(1) of the Act, 21 U.S.C. section 360bbb-3(b)(1), unless the authorization is terminated or revoked sooner.  Performed at [facility], [place] [address], [place], [place] [address]   MRSA PCR Screening     Status: None   Collection Time: 07/28/20  4:32 PM  Specimen: Nasal Mucosa; Nasopharyngeal  Result Value Ref Range Status   MRSA by PCR NEGATIVE NEGATIVE Final    Comment:        The GeneXpert MRSA Assay (FDA approved for NASAL specimens only), is one component of a comprehensive MRSA colonization surveillance program. It is not intended to diagnose MRSA infection nor to guide or monitor treatment for MRSA infections. Performed at [facility], [place] [address] [place], [place] [address]     Impression/Plan:  1. Cellulitis - improving with amoxicillin.  No changes.  Continue with amoxicillin.  2. Fever - resolved now with treatment for #1.

## 2020-07-30 NOTE — Progress Notes (Signed)
   07/30/20 1439  Clinical Encounter Type  Visited With Patient and family together  Visit Type Initial;Spiritual support (Advance Directive)  Referral From Physician  Consult/Referral To Chaplain  This Chaplain and Chaplain Loma Sousa responded to DO consult for spiritual care.  The Pt. is awake and sitting in her recliner. The Pt. daughter-Liz is present with the Pt. to proceed with Advance Directive education for the Pt. HCPOA and Living Will.  The chaplain completed education with the Pt. and the incomplete document was left with the Pt. The Pt. stated she will follow up with her family before making a decision to notarize the document.  Kathlee Nations will contact the spiritual care office through the RN when/if the Pt. is ready to use the notary services. The Pt. and family were appreciative of the visit and F/U spiritual care is available as needed.

## 2020-07-30 NOTE — Progress Notes (Addendum)
PROGRESS NOTE  [name] TXM:[id number] DOB: 09/27/1931 DOA: 07/25/2020 PCP: Hoyt Koch, MD  HPI/Recap of past 75 hours: 86 year old Caucasian female with a past medical history of permanent atrial fibrillation on warfarin, chronic diastolic CHF, essential hypertension, hyperlipidemia and chronic anxiety who comes in with left leg pain nausea vomiting and diarrhea.  Met SIRS criteria and noted to have left lower extremity cellulitis.  Started on empiric IV antibiotics.  TRH asked to admit for management of sepsis secondary to left lower extremity cellulitis.   Hospital course complicated by A. fib with RVR refractory to oral and IV beta-blocker.  Received a bolus of Cardizem infusion and was started on Cardizem drip.  Cardiology consulted on 07/29/2020 to assist with the management. Infectious disease has been consulted at patient's daughter request.  07/30/20: Seen and examined at her bedside.  Weak appearing with mild conversational dyspnea.  Not on oxygen supplementation at baseline.  Productive cough noted, started on pulmonary toilet.    Assessment/Plan: Principal Problem:   Sepsis ([place]) Active Problems:   Hyperlipidemia   Chronic diastolic heart failure (HCC)   Left leg cellulitis   Atrial fibrillation with rapid ventricular response (HCC)   Hypotension   Lung nodule   Pressure injury of skin  Severe sepsis, resolved, secondary to left lower extremity cellulitis/strep group C bacteremia Presented with lactic acidosis, 6.7, tachycardia, fever and left lower extremity cellulitis. Initially on IV vancomycin, cefepime and Flagyl. Blood cultures drawn on 07/25/2020 + for Streptococcus group C Lactic acidosis has resolved, afebrile Cefepime switched to Rocephin on 07/26/2020, Rocephin switched to unasyn on 07/28/20. No DVT noted on Doppler studies.   Left lower extremity seems to be improving gradually.  Infectious disease consulted at patient's daughter's request. Started  on Augmentin on 07/29/2020, appreciate IDs assistance. Continue to monitor fever curve and WBC Repeat CBC with differentials in the morning  Strep groupC bacteremia, likely from skin source, left lower extremity cellulitis. From blood culture drawn on 07/25/2020, positive for Streptococcus group C, pansensitive Management as stated above  Permanent A. fib with RVR, RVR resolved on cardizem and lopressor, suspect related to lung physiology Currently is on oral Lopressor 50 mg twice daily Received IV Cardizem bolus and Cardizem drip, discontinued on 07/30/2020 Started on p.o. Cardizem 30 mg every 6 hours per cardiology She is on warfarin for CVA prevention with therapeutic INR 2.4 TSH normal 2D echo done on 07/29/2020 showed normal LVEF 60 to 65% with mildly dilated left atrium. Continue to closely monitor on telemetry Rate is now controlled, repeat 12 lead EKG, follow results.  Acute hypoxic respiratory failure secondary to pulmonary edema seen on chest x-ray, likely from volume overload BNP mildly elevated greater than 300 Not on oxygen supplementation at baseline Continue IV diuresing as tolerated Continue to maintain O2 saturation greater than 92% Will need home oxygen evaluation prior to DC Started pulmonary toilet, Mucinex, hypersaline nebs, chest PT Encourage use of incentive spirometer  Acute transaminitis possibly secondary to hepatic congestion from volume overload Post cholecystectomy No acute findings on CT abdomen and pelvis done on 07/25/2020 Repeat CMP in the morning  Resolved AKI Baseline creatinine appears to be 0.6 with GFR greater than 60 Presented with creatinine of 1.1 She is back to her baseline creatinine Continue to avoid nephrotoxins Monitor urine output 1.3 L urine output recorded in the last 24 hours  Constipation Bowel regimen in place, Senokot, MiraLAX, Dulcolax May consider smog enema if no success with above.  Resolved post repletion:  Hypokalemia and  hypomagnesemia Serum potassium 4.1, serum magnesium 2.2. Replete as indicated  Right upper lobe lung nodule Incidental finding.  Outpatient monitoring.  Hyperlipidemia Continue statin.  Recent eye surgery Resume home eyedrops  Physical debility PT recommended CIR CIR consulted for evaluation and possible placement Continue fall precautions  Stage 2 pressure injury of left buttock, POA Local wound care    DVT Prophylaxis:  Warfarin Code Status: Full code Family Communication: Discussed with the patient's daughter at bedside. Disposition Plan:     Status is: Inpatient    Dispo:  Patient From: Home  Planned Disposition: Inpatient Rehab  Expected discharge date: 07/31/20  Medically stable for discharge: No, ongoing management for left lower extremity cellulitis and permanent A. fib with RVR.         Objective: Vitals:   07/30/20 0803 07/30/20 0929 07/30/20 1009 07/30/20 1211  BP: 112/61 117/60 111/66 112/64  Pulse: 82 91 94 81  Resp: _0 Temp: 97.7 F (36.5 C)   97.9 F (36.6 C)  TempSrc: Oral   Oral  SpO2: 95% 94% 94% 96%  Weight:      Height:        Intake/Output Summary (Last 24 hours) at 07/30/2020 1424 Last data filed at 07/30/2020 1214 Gross per 24 hour  Intake 835.97 ml  Output 1000 ml  Net -164.03 ml   Filed Weights   07/26/20 1032 07/28/20 0700 07/30/20 0635  Weight: 79.6 kg 88.5 kg 81 kg    Exam:  . General: 86 y.o. year-old female well-developed well-nourished in no acute distress.  Alert and interactive.   . Cardiovascular: Regular rate and rhythm no rubs or gallops. [place] Respiratory: Mild rales at bases no wheezing noted.  Poor inspiratory effort.  .  Abdomen: Soft nontender normal bowel sounds present. . Musculoskeletal: Left lower extremity erythema, warmth and tenderness improved from prior.  Clearing noted around the edges of marked areas.   [place] Psychiatry: Mood is appropriate for condition and setting.  Data  Reviewed: CBC: Recent Labs  Lab 07/25/20 1346 07/26/20 1155 07/27/20 0357 07/28/20 0507 07/29/20 0414  WBC 5.3 8.6 8.3 9.0 11.7*  NEUTROABS 3.8  --   --   --   --   HGB 16.4* 13.4 12.7 13.4 13.9  HCT 48.8* 40.2 38.0 41.6 42.6  MCV 91.2 91.8 91.8 93.7 92.6  PLT 180 171 145* 134* 191   Basic Metabolic Panel: Recent Labs  Lab 07/25/20 1346 07/26/20 1155 07/27/20 0357 07/28/20 0507 07/29/20 0414  NA 134* 135 136 136 136  K 3.6 3.1* 3.6 4.1 4.0  CL 99 102 104 105 101  CO2 20* 21* 22 20* 25  GLUCOSE 183* 108* 128* 116* 130*  BUN _1 CREATININE 1.10* 0.85 0.74 0.63 0.64  CALCIUM 9.5 8.5* 8.6* 8.7* 8.8*  MG  --   --  1.6* 2.2  --    GFR: Estimated Creatinine Clearance: 52 mL/min (by C-G formula based on SCr of 0.64 mg/dL). Liver Function Tests: Recent Labs  Lab 07/25/20 1346 07/29/20 0414  AST 51* 129*  ALT 31 78*  ALKPHOS 88 135*  BILITOT QUANTITY NOT SUFFICIENT, UNABLE TO PERFORM TEST 1.3*  PROT 7.0 5.7*  ALBUMIN 3.4* 1.8*   No results for input(s): LIPASE, AMYLASE in the last 168 hours. No results for input(s): AMMONIA in the last 168 hours. Coagulation Profile: Recent Labs  Lab 07/25/20 1656 07/26/20 0546 07/27/20 0357 07/28/20 0507 07/29/20 0414  INR  2.9* 4.1* 3.7* 3.0* 2.4*   Cardiac Enzymes: No results for input(s): CKTOTAL, CKMB, CKMBINDEX, TROPONINI in the last 168 hours. BNP (last 3 results) No results for input(s): PROBNP in the last 8760 hours. HbA1C: No results for input(s): HGBA1C in the last 72 hours. CBG: No results for input(s): GLUCAP in the last 168 hours. Lipid Profile: No results for input(s): CHOL, HDL, LDLCALC, TRIG, CHOLHDL, LDLDIRECT in the last 72 hours. Thyroid Function Tests: Recent Labs    07/28/20 1915  TSH 3.776   Anemia Panel: No results for input(s): VITAMINB12, FOLATE, FERRITIN, TIBC, IRON, RETICCTPCT in the last 72 hours. Urine analysis:    Component Value Date/Time   COLORURINE YELLOW 07/25/2020  1758   APPEARANCEUR CLEAR 07/25/2020 1758   LABSPEC 1.018 07/25/2020 1758   PHURINE 6.0 07/25/2020 1758   GLUCOSEU NEGATIVE 07/25/2020 1758   HGBUR NEGATIVE 07/25/2020 1758   BILIRUBINUR NEGATIVE 07/25/2020 1758   KETONESUR NEGATIVE 07/25/2020 1758   PROTEINUR NEGATIVE 07/25/2020 1758   NITRITE NEGATIVE 07/25/2020 1758   LEUKOCYTESUR NEGATIVE 07/25/2020 1758   Sepsis Labs: _0 (procalcitonin:4,lacticidven:4)  ) Recent Results (from the past 240 hour(s))  Blood Culture (routine x 2)     Status: Abnormal   Collection Time: 07/25/20  3:30 PM   Specimen: BLOOD RIGHT HAND  Result Value Ref Range Status   Specimen Description BLOOD RIGHT HAND  Final   Special Requests   Final    BOTTLES DRAWN AEROBIC ONLY Blood Culture results may not be optimal due to an inadequate volume of blood received in culture bottles   Culture  Setup Time   Final    GRAM POSITIVE COCCI IN CHAINS AEROBIC BOTTLE ONLY CRITICAL RESULT CALLED TO, READ BACK BY AND VERIFIED WITH: A. Rogers Blocker PharmD 8:30 07/26/20 ([email or web address]) Performed at [facility], [place] [address] [place], [place] [address]    Culture STREPTOCOCCUS GROUP C (A)  Final   Report Status 07/28/2020 FINAL  Final   Organism ID, Bacteria STREPTOCOCCUS GROUP C  Final      Susceptibility   Streptococcus group c - MIC*    CLINDAMYCIN <=0.25 SENSITIVE Sensitive     AMPICILLIN <=0.25 SENSITIVE Sensitive     ERYTHROMYCIN <=0.12 SENSITIVE Sensitive     VANCOMYCIN 0.25 SENSITIVE Sensitive     CEFTRIAXONE <=0.12 SENSITIVE Sensitive     LEVOFLOXACIN 1 SENSITIVE Sensitive     * STREPTOCOCCUS GROUP C  Urine culture     Status: None   Collection Time: 07/25/20  3:30 PM   Specimen: In/Out Cath Urine  Result Value Ref Range Status   Specimen Description IN/OUT CATH URINE  Final   Special Requests NONE  Final   Culture   Final    NO GROWTH Performed at [facility], [place] [address], [place], [place] [address]    Report Status 07/26/2020 FINAL   Final  Blood Culture ID Panel (Reflexed)     Status: Abnormal   Collection Time: 07/25/20  3:30 PM  Result Value Ref Range Status   Enterococcus faecalis NOT DETECTED NOT DETECTED Final   Enterococcus Faecium NOT DETECTED NOT DETECTED Final   Listeria monocytogenes NOT DETECTED NOT DETECTED Final   Staphylococcus species NOT DETECTED NOT DETECTED Final   Staphylococcus aureus (BCID) NOT DETECTED NOT DETECTED Final   Staphylococcus epidermidis NOT DETECTED NOT DETECTED Final   Staphylococcus lugdunensis NOT DETECTED NOT DETECTED Final   Streptococcus species DETECTED (A) NOT DETECTED Final    Comment: Not Enterococcus species, Streptococcus  agalactiae, Streptococcus pyogenes, or Streptococcus pneumoniae. CRITICAL RESULT CALLED TO, READ BACK BY AND VERIFIED WITH: A. Rogers Blocker PharmD 8:30 07/26/20 ([email or web address])    Streptococcus agalactiae NOT DETECTED NOT DETECTED Final   Streptococcus pneumoniae NOT DETECTED NOT DETECTED Final   Streptococcus pyogenes NOT DETECTED NOT DETECTED Final   A.calcoaceticus-baumannii NOT DETECTED NOT DETECTED Final   Bacteroides fragilis NOT DETECTED NOT DETECTED Final   Enterobacterales NOT DETECTED NOT DETECTED Final   Enterobacter cloacae complex NOT DETECTED NOT DETECTED Final   Escherichia coli NOT DETECTED NOT DETECTED Final   Klebsiella aerogenes NOT DETECTED NOT DETECTED Final   Klebsiella oxytoca NOT DETECTED NOT DETECTED Final   Klebsiella pneumoniae NOT DETECTED NOT DETECTED Final   Proteus species NOT DETECTED NOT DETECTED Final   Salmonella species NOT DETECTED NOT DETECTED Final   Serratia marcescens NOT DETECTED NOT DETECTED Final   Haemophilus influenzae NOT DETECTED NOT DETECTED Final   Neisseria meningitidis NOT DETECTED NOT DETECTED Final   Pseudomonas aeruginosa NOT DETECTED NOT DETECTED Final   Stenotrophomonas maltophilia NOT DETECTED NOT DETECTED Final   Candida albicans NOT DETECTED NOT DETECTED Final   Candida auris NOT DETECTED NOT DETECTED  Final   Candida glabrata NOT DETECTED NOT DETECTED Final   Candida krusei NOT DETECTED NOT DETECTED Final   Candida parapsilosis NOT DETECTED NOT DETECTED Final   Candida tropicalis NOT DETECTED NOT DETECTED Final   Cryptococcus neoformans/gattii NOT DETECTED NOT DETECTED Final    Comment: Performed at [facility], [address] [place], [place] [address]  SARS Coronavirus 2 by RT PCR (hospital order, performed in [facility] hospital lab) Nasopharyngeal Nasopharyngeal Swab     Status: None   Collection Time: 07/25/20  3:33 PM   Specimen: Nasopharyngeal Swab  Result Value Ref Range Status   SARS Coronavirus 2 NEGATIVE NEGATIVE Final    Comment: (NOTE) SARS-CoV-2 target nucleic acids are NOT DETECTED.  The SARS-CoV-2 RNA is generally detectable in upper and lower respiratory specimens during the acute phase of infection. The lowest concentration of SARS-CoV-2 viral copies this assay can detect is 250 copies / mL. A negative result does not preclude SARS-CoV-2 infection and should not be used as the sole basis for treatment or other patient management decisions.  A negative result may occur with improper specimen collection / handling, submission of specimen other than nasopharyngeal swab, presence of viral mutation(s) within the areas targeted by this assay, and inadequate number of viral copies (<250 copies / mL). A negative result must be combined with clinical observations, patient history, and epidemiological information.  Fact Sheet for Patients:   StrictlyIdeas.no  Fact Sheet for Healthcare Providers: BankingDealers.co.za  This test is not yet approved or  cleared by the [place] FDA and has been authorized for detection and/or diagnosis of SARS-CoV-2 by FDA under an Emergency Use Authorization (EUA).  This EUA will remain in effect (meaning this test can be used) for the duration of the COVID-19 declaration under  Section 564(b)(1) of the Act, 21 U.S.C. section 360bbb-3(b)(1), unless the authorization is terminated or revoked sooner.  Performed at [facility], [place] [address] [place], [place] [address]   MRSA PCR Screening     Status: None   Collection Time: 07/28/20  4:32 PM   Specimen: Nasal Mucosa; Nasopharyngeal  Result Value Ref Range Status   MRSA by PCR NEGATIVE NEGATIVE Final    Comment:        The GeneXpert MRSA Assay (FDA approved for NASAL  specimens only), is one component of a comprehensive MRSA colonization surveillance program. It is not intended to diagnose MRSA infection nor to guide or monitor treatment for MRSA infections. Performed at [facility], [place] [address] [place], [place] [address]       Studies: ECHOCARDIOGRAM COMPLETE  Result Date: 07/29/2020    ECHOCARDIOGRAM REPORT   Patient Name:   [name] Date of Exam: 07/29/2020 Medical Rec #:  [id number]      Height:       64.0 in Accession #:    [id number]     Weight:       195.1 lb Date of Birth:  03/13/1931      BSA:          1.936 m Patient Age:    72 years       BP:           100/61 mmHg Patient Gender: F              HR:           85 bpm. Exam Location:  Inpatient Procedure: 2D Echo Indications:    atrial fibrillation  History:        Patient has no prior history of Echocardiogram examinations.                 Risk Factors:Hypertension.  Sonographer:    Johny Chess Referring Phys: [id number] [place]  1. Left ventricular ejection fraction, by estimation, is 60 to 65%. The left ventricle has normal function. The left ventricle has no regional wall motion abnormalities. Left ventricular diastolic parameters are consistent with age-related delayed relaxation (normal).  2. Right ventricular systolic function is normal. The right ventricular size is normal. There is normal pulmonary artery systolic pressure.  3. Left atrial size was mildly dilated.  4. The mitral valve is degenerative. Mild  mitral valve regurgitation. No evidence of mitral stenosis.  5. The aortic valve is tricuspid. Aortic valve regurgitation is trivial. Mild to moderate aortic valve sclerosis/calcification is present, without any evidence of aortic stenosis.  6. The inferior vena cava is normal in size with greater than 50% respiratory variability, suggesting right atrial pressure of 3 mmHg. FINDINGS  Left Ventricle: Left ventricular ejection fraction, by estimation, is 60 to 65%. The left ventricle has normal function. The left ventricle has no regional wall motion abnormalities. The left ventricular internal cavity size was normal in size. There is  no left ventricular hypertrophy. Left ventricular diastolic parameters are consistent with age-related delayed relaxation (normal). Right Ventricle: The right ventricular size is normal. No increase in right ventricular wall thickness. Right ventricular systolic function is normal. There is normal pulmonary artery systolic pressure. The tricuspid regurgitant velocity is 2.13 m/s, and  with an assumed right atrial pressure of 10 mmHg, the estimated right ventricular systolic pressure is 00.9 mmHg. Left Atrium: Left atrial size was mildly dilated. Right Atrium: Right atrial size was normal in size. Pericardium: There is no evidence of pericardial effusion. Mitral Valve: The mitral valve is degenerative in appearance. There is moderate thickening of the mitral valve leaflet(s). There is moderate calcification of the mitral valve leaflet(s). Normal mobility of the mitral valve leaflets. Moderate mitral annular calcification. Mild mitral valve regurgitation. No evidence of mitral valve stenosis. Tricuspid Valve: The tricuspid valve is normal in structure. Tricuspid valve regurgitation is mild . No evidence of tricuspid stenosis. Aortic Valve: The aortic valve is tricuspid. Aortic valve regurgitation is trivial.  Mild to moderate aortic valve sclerosis/calcification is present, without any  evidence of aortic stenosis. Pulmonic Valve: The pulmonic valve was normal in structure. Pulmonic valve regurgitation is mild. No evidence of pulmonic stenosis. Aorta: The aortic root is normal in size and structure. Venous: The inferior vena cava is normal in size with greater than 50% respiratory variability, suggesting right atrial pressure of 3 mmHg. IAS/Shunts: No atrial level shunt detected by color flow Doppler.  LEFT VENTRICLE PLAX 2D LVIDd:         4.10 cm LVIDs:         3.00 cm LV PW:         0.80 cm LV IVS:        0.70 cm LVOT diam:     1.90 cm LV SV:         55 LV SV Index:   29 LVOT Area:     2.84 cm  IVC IVC diam: 2.15 cm LEFT ATRIUM             Index       RIGHT ATRIUM           Index LA diam:        3.90 cm 2.01 cm/m  RA Area:     19.10 cm LA Vol (A2C):   79.9 ml 41.27 ml/m RA Volume:   50.30 ml  25.98 ml/m LA Vol (A4C):   84.0 ml 43.39 ml/m LA Biplane Vol: 85.2 ml 44.01 ml/m  AORTIC VALVE LVOT Vmax:   115.00 cm/s LVOT Vmean:  76.100 cm/s LVOT VTI:    0.195 m  AORTA Ao Root diam: 2.70 cm Ao Asc diam:  3.00 cm TRICUSPID VALVE TR Peak grad:   18.1 mmHg TR Vmax:        213.00 cm/s  SHUNTS Systemic VTI:  0.20 m Systemic Diam: 1.90 cm Jenkins Rouge MD Electronically signed by Jenkins Rouge MD Signature Date/Time: 07/29/2020/4:21:32 PM    Final     Scheduled Meds: . amoxicillin  500 mg Oral Q8H  . atorvastatin  20 mg Oral q1800  . calcium-vitamin D  1 tablet Oral QODAY  . dextromethorphan-guaiFENesin  2 tablet Oral BID  . [START ON 07/31/2020] diltiazem  30 mg Oral Q6H  . furosemide  20 mg Intravenous BID  . ipratropium  0.5 mg Nebulization Q8H  . levalbuterol  0.63 mg Nebulization Q8H  . metoprolol tartrate  50 mg Oral BID  . mineral oil  1 enema Rectal Once  . polyethylene glycol  17 g Oral Daily  . senna-docusate  2 tablet Oral BID  . sodium chloride HYPERTONIC  4 mL Nebulization BID  . warfarin  2.5 mg Oral ONCE-1600  . Warfarin - Pharmacist Dosing Inpatient   Does not apply q1600     Continuous Infusions: . diltiazem (CARDIZEM) infusion 10 mg/hr (07/30/20 0230)     LOS: 5 days     Kayleen Memos, MD [facility] Pager [phone]  If 7PM-7AM, please contact night-coverage [email or web address] Password [facility] 07/30/2020, 2:24 PM

## 2020-07-30 NOTE — Progress Notes (Signed)
[place] for Warfarin  Indication: atrial fibrillation  Allergies  Allergen Reactions  . Antihistamines, Diphenhydramine-Type Other (See Comments)    ALL ANTIHISTAMINES causes her heart to race  . Oxycodone-Acetaminophen Nausea Only    Patient Measurements: Height: 5\' 4"  (162.6 cm) Weight: 81 kg (178 lb 9.2 oz) IBW/kg (Calculated) : 54.7 Heparin Dosing Weight:   Vital Signs: Temp: 97.9 F (36.6 C) (09/03 1211) Temp Source: Oral (09/03 1211) BP: 112/64 (09/03 1211) Pulse Rate: 81 (09/03 1211)  Labs: Recent Labs    07/28/20 0507 07/29/20 0414  HGB 13.4 13.9  HCT 41.6 42.6  PLT 134* 172  LABPROT 30.5* 25.7*  INR 3.0* 2.4*  CREATININE 0.63 0.64    Estimated Creatinine Clearance: 52 mL/min (by C-G formula based on SCr of 0.64 mg/dL).   Medical History: Past Medical History:  Diagnosis Date  . A-fib ([place])    paroxysmal  . Aftercare for healing traumatic fracture of arm, unspecified   . H/O: hemorrhoidectomy   . H/O: hysterectomy   . Hyperlipidemia   . Hypertension   . Malignant melanoma of skin of trunk, except scrotum ([place])   . Other postprocedural status(V45.89)   . Rosacea   . S/P cholecystectomy   . Sciatica    right  . Sprain of lumbar region     Medications:  Scheduled:  . amoxicillin  500 mg Oral Q8H  . atorvastatin  20 mg Oral q1800  . calcium-vitamin D  1 tablet Oral QODAY  . dextromethorphan-guaiFENesin  2 tablet Oral BID  . [START ON 07/31/2020] diltiazem  30 mg Oral Q6H  . furosemide  20 mg Intravenous BID  . ipratropium  0.5 mg Nebulization Q8H  . levalbuterol  0.63 mg Nebulization Q8H  . metoprolol tartrate  50 mg Oral BID  . mineral oil  1 enema Rectal Once  . polyethylene glycol  17 g Oral Daily  . senna-docusate  2 tablet Oral BID  . sodium chloride HYPERTONIC  4 mL Nebulization BID  . Warfarin - Pharmacist Dosing Inpatient   Does not apply q1600    Assessment: Patient is a 78 yof that  presents to the ED with N/V/D. The patient was found to be septic and started on broad spectrum antibiotics. The patient takes warfarin PTA for afib. Pharmacy has been asked to continue dosing warfarin while inpatient.   Warfarin PTA dose: 5mg  on Monday and 2.5 ROW   No labs able to be analyzed this morning, therapeutic yesterday. Therefore will give home regimen tonight (2.5mg ).  Goal of Therapy:  INR 2-3 Monitor platelets by anticoagulation protocol: Yes   Plan:  Warfarin 2.5mg  PO x1 tonight Daily protime   Arrie Senate, PharmD, BCPS Clinical Pharmacist [phone] Please check AMION for all [facility] numbers 07/30/2020

## 2020-07-30 NOTE — Progress Notes (Addendum)
Electrophysiology Rounding Note  Patient Name: [name] [name] [facility] Date of Encounter: 07/30/2020  Primary Cardiologist: No primary care provider on file. Electrophysiologist: Cristopher Peru, MD   Subjective   Feeling much better today.   Inpatient Medications    Scheduled Meds: . amoxicillin  500 mg Oral Q8H  . atorvastatin  20 mg Oral q1800  . calcium-vitamin D  1 tablet Oral QODAY  . [START ON 07/31/2020] diltiazem  30 mg Oral Q6H  . furosemide  20 mg Intravenous BID  . metoprolol tartrate  50 mg Oral BID  . polyethylene glycol  17 g Oral Daily  . senna-docusate  2 tablet Oral BID  . Warfarin - Pharmacist Dosing Inpatient   Does not apply q1600   Continuous Infusions: . diltiazem (CARDIZEM) infusion 10 mg/hr (07/30/20 0230)   PRN Meds: acetaminophen, ALPRAZolam, iohexol, metoprolol tartrate, ondansetron (ZOFRAN) IV, polyvinyl alcohol   Vital Signs    Vitals:   07/29/20 2132 07/30/20 0130 07/30/20 0635 07/30/20 0803  BP:  104/61 110/65   Pulse:  70 81   Resp:  (!) 24 (!) 24 (P) 20  Temp: 98.4 F (36.9 C) 97.6 F (36.4 C) (!) 97.5 F (36.4 C) (P) 97.7 F (36.5 C)  TempSrc: Oral Axillary Axillary (P) Oral  SpO2:  95% 97%   Weight:   81 kg   Height:        Intake/Output Summary (Last 24 hours) at 07/30/2020 0828 Last data filed at 07/30/2020 0230 Gross per 24 hour  Intake 955.97 ml  Output 1501 ml  Net -545.03 ml   Filed Weights   07/26/20 1032 07/28/20 0700 07/30/20 0635  Weight: 79.6 kg 88.5 kg 81 kg    Physical Exam    GEN- The patient is well appearing, alert and oriented x 3 today.   Head- normocephalic, atraumatic Eyes-  Sclera clear, conjunctiva pink Ears- hearing intact Oropharynx- clear Neck- supple Lungs- Clear to ausculation bilaterally, normal work of breathing Heart- Irregularly irregular, no murmurs, rubs or gallops GI- soft, NT, ND, + BS Extremities- no clubbing or cyanosis. No edema Skin- no rash or lesion Psych- euthymic mood, full  affect Neuro- strength and sensation are intact  Labs    CBC Recent Labs    07/28/20 0507 07/29/20 0414  WBC 9.0 11.7*  HGB 13.4 13.9  HCT 41.6 42.6  MCV 93.7 92.6  PLT 134* 782   Basic Metabolic Panel Recent Labs    07/28/20 0507 07/29/20 0414  NA 136 136  K 4.1 4.0  CL 105 101  CO2 20* 25  GLUCOSE 116* 130*  BUN 19 11  CREATININE 0.63 0.64  CALCIUM 8.7* 8.8*  MG 2.2  --    Liver Function Tests Recent Labs    07/29/20 0414  AST 129*  ALT 78*  ALKPHOS 135*  BILITOT 1.3*  PROT 5.7*  ALBUMIN 1.8*   No results for input(s): LIPASE, AMYLASE in the last 72 hours. Cardiac Enzymes No results for input(s): CKTOTAL, CKMB, CKMBINDEX, TROPONINI in the last 72 hours.   Telemetry    Atrial fibrillation, rates gradually improved to 70s on diltiazem (personally reviewed)  Radiology    ECHOCARDIOGRAM COMPLETE  Result Date: 07/29/2020    ECHOCARDIOGRAM REPORT   Patient Name:   [name] [name] [facility] Date of Exam: 07/29/2020 Medical Rec #:  [id number]      Height:       64.0 in Accession #:    [id number]     Weight:  195.1 lb Date of Birth:  Apr 06, 1931      BSA:          1.936 m Patient Age:    86 years       BP:           100/61 mmHg Patient Gender: F              HR:           85 bpm. Exam Location:  Inpatient Procedure: 2D Echo Indications:    atrial fibrillation  History:        Patient has no prior history of Echocardiogram examinations.                 Risk Factors:Hypertension.  Sonographer:    Johny Chess Referring Phys: [id number] [place]  1. Left ventricular ejection fraction, by estimation, is 60 to 65%. The left ventricle has normal function. The left ventricle has no regional wall motion abnormalities. Left ventricular diastolic parameters are consistent with age-related delayed relaxation (normal).  2. Right ventricular systolic function is normal. The right ventricular size is normal. There is normal pulmonary artery systolic pressure.  3. Left atrial  size was mildly dilated.  4. The mitral valve is degenerative. Mild mitral valve regurgitation. No evidence of mitral stenosis.  5. The aortic valve is tricuspid. Aortic valve regurgitation is trivial. Mild to moderate aortic valve sclerosis/calcification is present, without any evidence of aortic stenosis.  6. The inferior vena cava is normal in size with greater than 50% respiratory variability, suggesting right atrial pressure of 3 mmHg. FINDINGS  Left Ventricle: Left ventricular ejection fraction, by estimation, is 60 to 65%. The left ventricle has normal function. The left ventricle has no regional wall motion abnormalities. The left ventricular internal cavity size was normal in size. There is  no left ventricular hypertrophy. Left ventricular diastolic parameters are consistent with age-related delayed relaxation (normal). Right Ventricle: The right ventricular size is normal. No increase in right ventricular wall thickness. Right ventricular systolic function is normal. There is normal pulmonary artery systolic pressure. The tricuspid regurgitant velocity is 2.13 m/s, and  with an assumed right atrial pressure of 10 mmHg, the estimated right ventricular systolic pressure is 46.6 mmHg. Left Atrium: Left atrial size was mildly dilated. Right Atrium: Right atrial size was normal in size. Pericardium: There is no evidence of pericardial effusion. Mitral Valve: The mitral valve is degenerative in appearance. There is moderate thickening of the mitral valve leaflet(s). There is moderate calcification of the mitral valve leaflet(s). Normal mobility of the mitral valve leaflets. Moderate mitral annular calcification. Mild mitral valve regurgitation. No evidence of mitral valve stenosis. Tricuspid Valve: The tricuspid valve is normal in structure. Tricuspid valve regurgitation is mild . No evidence of tricuspid stenosis. Aortic Valve: The aortic valve is tricuspid. Aortic valve regurgitation is trivial. Mild to  moderate aortic valve sclerosis/calcification is present, without any evidence of aortic stenosis. Pulmonic Valve: The pulmonic valve was normal in structure. Pulmonic valve regurgitation is mild. No evidence of pulmonic stenosis. Aorta: The aortic root is normal in size and structure. Venous: The inferior vena cava is normal in size with greater than 50% respiratory variability, suggesting right atrial pressure of 3 mmHg. IAS/Shunts: No atrial level shunt detected by color flow Doppler.  LEFT VENTRICLE PLAX 2D LVIDd:         4.10 cm LVIDs:         3.00 cm LV PW:  0.80 cm LV IVS:        0.70 cm LVOT diam:     1.90 cm LV SV:         55 LV SV Index:   29 LVOT Area:     2.84 cm  IVC IVC diam: 2.15 cm LEFT ATRIUM             Index       RIGHT ATRIUM           Index LA diam:        3.90 cm 2.01 cm/m  RA Area:     19.10 cm LA Vol (A2C):   79.9 ml 41.27 ml/m RA Volume:   50.30 ml  25.98 ml/m LA Vol (A4C):   84.0 ml 43.39 ml/m LA Biplane Vol: 85.2 ml 44.01 ml/m  AORTIC VALVE LVOT Vmax:   115.00 cm/s LVOT Vmean:  76.100 cm/s LVOT VTI:    0.195 m  AORTA Ao Root diam: 2.70 cm Ao Asc diam:  3.00 cm TRICUSPID VALVE TR Peak grad:   18.1 mmHg TR Vmax:        213.00 cm/s  SHUNTS Systemic VTI:  0.20 m Systemic Diam: 1.90 cm Jenkins Rouge MD Electronically signed by Jenkins Rouge MD Signature Date/Time: 07/29/2020/4:21:32 PM    Final     Patient Profile     NAYZETH ALTMAN is a 86 y.o. female with a history of AF, HTN, chronic diastolic CHF, essential HTN, HDL, and anxiety admitted for cellulitis -> bacteremia who is being seen for the evaluation of afib with RVR at the request of Dr. Nevada Crane.  Assessment & Plan    1.  AF with RVR Exacerbated in the setting of acute illness She is on coumadin chronically for CHA2DS2VASC of at least 5    Lopressor has been increased to 50 mg BID Rates much improved on diltiazem. Continue gtt today, transition to short acting po in the am.  She has been in atrial fibrillation for  years by chart review. Likelihood of any successful attempts at cardioversion would be exceedingly low.   2. HTN Stable currently.   3. Strep group C bacteremia, likely from LLE cellulitis Patient initially started on vancomycin cefepime and Flagyl. Blood cultures growing strepgroup C, pansensitive.Ceftriaxone switched to unasyn on 07/28/20. (would watch sodium load with unasyn and volume overload) Now on po amoxicillin Her cellulitis is less tender and less warm today.  Tmax 98.4  4 week follow up with EP has been made and placed in chart.   For questions or updates, please contact [place] Please consult [email or web address] for contact info under Cardiology/STEMI.  Signed, Shirley Friar, PA-C  07/30/2020, 8:28 AM   EP Attending  Patient seen and examined. Agree with above. The patient is much improved today. Her atrial fib is controlled. If she remains stable, would switch from IV to oral cardizem on 9/4.   Carleene Overlie Jamar Casagrande,MD

## 2020-07-30 NOTE — Progress Notes (Signed)
Physical Therapy Treatment Patient Details Name: [name] [name] MRN: [id number] DOB: 05-10-1931 Today's Date: 07/30/2020    History of Present Illness 86 year old female with a past medical history of atrial fibrillation on warfarin, chronic diastolic CHF, essential hypertension, hyperlipidemia and anxiety who comes in with left leg pain nausea vomiting and diarrhea.  She was found to be febrile.  Found to be tachycardic.  Noted to have erythema and swelling of the left flank.  Admitted for management of sepsis secondary to left lower extremity cellulitis.    PT Comments    Pt is lying nearly flat on her back with obvious work of breathing. Pt and daughter agreeable to getting up to recliner. Pt continues to be limited in safe mobility by increased pain in L LE due to cellulitis. Improvement in cellulitis is noticible however continue to be too painful for weightbearing. Pt also has decreased strength and endurance. Pt requires maxAx2 for bed mobility, and total Ax2 for squat pivot transfer to recliner. Once up in chair pt breathing is less labored. Educated pt on need for movement of L ankle to aide in decreasing LE edema. Pt daughter to help remind pt to perform approx 20 ankle circles per hour. Pt in agreement. D/c plan remains appropriate. PT will continue to follow acutely.    Follow Up Recommendations  CIR     Equipment Recommendations  None recommended by PT    Recommendations for Other Services Rehab consult     Precautions / Restrictions Precautions Precautions: Fall Precaution Comments: L LE pain Restrictions Weight Bearing Restrictions: No    Mobility  Bed Mobility Overal bed mobility: Needs Assistance Bed Mobility: Supine to Sit     Supine to sit: Max assist;+2 for physical assistance;+2 for safety/equipment     General bed mobility comments: maxAx2 for coming to EoB, cued for scooting to EOB however needs pad scoot to get L foot on floor  Transfers Overall  transfer level: Needs assistance   Transfers: Squat Pivot Transfers     Squat pivot transfers: +2 physical assistance;Total assist     General transfer comment: total Ax2 for lateral scooting and squat pivot to recliner,   Ambulation/Gait             General Gait Details: unable to take a step       Balance Overall balance assessment: Needs assistance Sitting-balance support: Feet supported;Bilateral upper extremity supported Sitting balance-Leahy Scale: Poor Sitting balance - Comments: initially max A able to progress to min guard                                     Cognition Arousal/Alertness: Awake/alert Behavior During Therapy: Flat affect Overall Cognitive Status: Within Functional Limits for tasks assessed                                        Exercises General Exercises - Lower Extremity Ankle Circles/Pumps: 20 reps;AROM;Both;Seated    General Comments General comments (skin integrity, edema, etc.): at rest BP 117/67 HR 90bpm, SaO2 on 2L O2 via [place] 95%O2, with movement to chair BP 116/68       Pertinent Vitals/Pain Pain Assessment: Faces Faces Pain Scale: Hurts whole lot Pain Location: L LE with movement/touch Pain Descriptors / Indicators: Grimacing;Tender Pain Intervention(s): Limited activity within patient's tolerance;Monitored during session;Repositioned  PT Goals (current goals can now be found in the care plan section) Acute Rehab PT Goals Patient Stated Goal: manage pain PT Goal Formulation: With patient/family Time For Goal Achievement: 08/10/20 Potential to Achieve Goals: Good Progress towards PT goals: Progressing toward goals    Frequency    Min 3X/week      PT Plan Current plan remains appropriate       AM-PAC PT "6 Clicks" Mobility   Outcome Measure  Help needed turning from your back to your side while in a flat bed without using bedrails?: A Lot Help needed moving from lying on  your back to sitting on the side of a flat bed without using bedrails?: A Lot Help needed moving to and from a bed to a chair (including a wheelchair)?: A Lot Help needed standing up from a chair using your arms (e.g., wheelchair or bedside chair)?: Total Help needed to walk in hospital room?: Total Help needed climbing 3-5 steps with a railing? : Total 6 Click Score: 9    End of Session Equipment Utilized During Treatment: Gait belt;Oxygen Activity Tolerance: Patient limited by pain Patient left: with call bell/phone within reach;with family/visitor present;in chair;with chair alarm set Nurse Communication: Mobility status PT Visit Diagnosis: Muscle weakness (generalized) (M62.81);Pain Pain - Right/Left: Left Pain - part of body: Leg;Knee;Ankle and joints of foot     Time: 9735-3299 PT Time Calculation (min) (ACUTE ONLY): 31 min  Charges:  $Therapeutic Activity: 23-37 mins                     Ilena Dieckman B. Migdalia Dk PT, DPT Acute Rehabilitation Services Pager [phone] Office [phone]    [place] 07/30/2020, 12:24 PM

## 2020-07-31 ENCOUNTER — Inpatient Hospital Stay (HOSPITAL_COMMUNITY): Payer: Medicare Other

## 2020-07-31 DIAGNOSIS — I10 Essential (primary) hypertension: Secondary | ICD-10-CM

## 2020-07-31 LAB — COMPREHENSIVE METABOLIC PANEL
ALT: 95 U/L — ABNORMAL HIGH (ref 0–44)
AST: 181 U/L — ABNORMAL HIGH (ref 15–41)
Albumin: 1.6 g/dL — ABNORMAL LOW (ref 3.5–5.0)
Alkaline Phosphatase: 177 U/L — ABNORMAL HIGH (ref 38–126)
Anion gap: 8 (ref 5–15)
BUN: 14 mg/dL (ref 8–23)
CO2: 27 mmol/L (ref 22–32)
Calcium: 8.4 mg/dL — ABNORMAL LOW (ref 8.9–10.3)
Chloride: 97 mmol/L — ABNORMAL LOW (ref 98–111)
Creatinine, Ser: 0.57 mg/dL (ref 0.44–1.00)
GFR calc Af Amer: >60 mL/min (ref >60)
GFR calc non Af Amer: >60 mL/min (ref >60)
Glucose, Bld: 135 mg/dL — ABNORMAL HIGH (ref 70–99)
Potassium: 4 mmol/L (ref 3.5–5.1)
Sodium: 132 mmol/L — ABNORMAL LOW (ref 135–145)
Total Bilirubin: 1.5 mg/dL — ABNORMAL HIGH (ref 0.3–1.2)
Total Protein: 5.7 g/dL — ABNORMAL LOW (ref 6.5–8.1)

## 2020-07-31 LAB — CBC WITH DIFFERENTIAL/PLATELET
Abs Immature Granulocytes: 0 10*3/uL (ref 0.00–0.07)
Basophils Absolute: 0 10*3/uL (ref 0.0–0.1)
Basophils Relative: 0 %
Eosinophils Absolute: 0.2 10*3/uL (ref 0.0–0.5)
Eosinophils Relative: 1 %
HCT: 36.8 % (ref 36.0–46.0)
Hemoglobin: 12 g/dL (ref 12.0–15.0)
Lymphocytes Relative: 6 %
Lymphs Abs: 1 10*3/uL (ref 0.7–4.0)
MCH: 29.8 pg (ref 26.0–34.0)
MCHC: 32.6 g/dL (ref 30.0–36.0)
MCV: 91.3 fL (ref 80.0–100.0)
Monocytes Absolute: 0.5 10*3/uL (ref 0.1–1.0)
Monocytes Relative: 3 %
Neutro Abs: 15 10*3/uL — ABNORMAL HIGH (ref 1.7–7.7)
Neutrophils Relative %: 90 %
Platelets: 311 10*3/uL (ref 150–400)
RBC: 4.03 MIL/uL (ref 3.87–5.11)
RDW: 14 % (ref 11.5–15.5)
WBC: 16.7 10*3/uL — ABNORMAL HIGH (ref 4.0–10.5)
nRBC: 0 % (ref 0.0–0.2)
nRBC: 0 /100 WBC

## 2020-07-31 LAB — PROTIME-INR
INR: 3.1 — ABNORMAL HIGH (ref 0.8–1.2)
Prothrombin Time: 30.7 seconds — ABNORMAL HIGH (ref 11.4–15.2)

## 2020-07-31 LAB — LIPASE, BLOOD: Lipase: 78 U/L — ABNORMAL HIGH (ref 11–51)

## 2020-07-31 LAB — HEPATITIS PANEL, ACUTE
HCV Ab: NONREACTIVE
Hep A IgM: NONREACTIVE
Hep B C IgM: NONREACTIVE
Hepatitis B Surface Ag: NONREACTIVE

## 2020-07-31 LAB — PROCALCITONIN: Procalcitonin: 1.37 ng/mL

## 2020-07-31 LAB — LACTIC ACID, PLASMA: Lactic Acid, Venous: 1.7 mmol/L (ref 0.5–1.9)

## 2020-07-31 LAB — HEMOGLOBIN A1C
Hgb A1c MFr Bld: 6.8 % — ABNORMAL HIGH (ref 4.8–5.6)
Mean Plasma Glucose: 148.46 mg/dL

## 2020-07-31 MED ORDER — WARFARIN SODIUM 1 MG PO TABS
1.0000 mg | ORAL_TABLET | Freq: Once | ORAL | Status: AC
  Administered 2020-07-31: 1 mg via ORAL
  Filled 2020-07-31: qty 1

## 2020-07-31 MED ORDER — LEVALBUTEROL HCL 0.63 MG/3ML IN NEBU
0.6300 mg | INHALATION_SOLUTION | Freq: Two times a day (BID) | RESPIRATORY_TRACT | Status: DC
  Administered 2020-07-31 – 2020-08-11 (×22): 0.63 mg via RESPIRATORY_TRACT
  Filled 2020-07-31 (×23): qty 3

## 2020-07-31 MED ORDER — ENSURE ENLIVE PO LIQD
237.0000 mL | Freq: Two times a day (BID) | ORAL | Status: DC
  Administered 2020-07-31 – 2020-08-08 (×14): 237 mL via ORAL

## 2020-07-31 MED ORDER — IPRATROPIUM BROMIDE 0.02 % IN SOLN
0.5000 mg | Freq: Two times a day (BID) | RESPIRATORY_TRACT | Status: DC
  Administered 2020-07-31 – 2020-08-11 (×22): 0.5 mg via RESPIRATORY_TRACT
  Filled 2020-07-31 (×23): qty 2.5

## 2020-07-31 NOTE — Progress Notes (Signed)
[name] [name] for Warfarin  Indication: atrial fibrillation  Allergies  Allergen Reactions  . Antihistamines, Diphenhydramine-Type Other (See Comments)    ALL ANTIHISTAMINES causes her heart to race  . Oxycodone-Acetaminophen Nausea Only    Patient Measurements: Height: 5\' 4"  (162.6 cm) Weight: 79.6 kg (175 lb 7.8 oz) IBW/kg (Calculated) : 54.7 Heparin Dosing Weight:   Vital Signs: Temp: 98.8 F (37.1 C) (09/04 0747) Temp Source: Oral (09/04 0747) BP: 121/68 (09/04 1145) Pulse Rate: 108 (09/04 1145)  Labs: Recent Labs    07/29/20 0414 07/29/20 0414 07/30/20 1735 07/31/20 0717  HGB 13.9   < > 13.1 12.0  HCT 42.6  --  40.6 36.8  PLT 172  --  239 311  LABPROT 25.7*  --  27.9* 30.7*  INR 2.4*  --  2.7* 3.1*  CREATININE 0.64  --  0.62 0.57   < > = values in this interval not displayed.    Estimated Creatinine Clearance: 51.6 mL/min (by C-G formula based on SCr of 0.57 mg/dL).   Medical History: Past Medical History:  Diagnosis Date  . A-fib ([place])    paroxysmal  . Aftercare for healing traumatic fracture of arm, unspecified   . H/O: hemorrhoidectomy   . H/O: hysterectomy   . Hyperlipidemia   . Hypertension   . Malignant melanoma of skin of trunk, except scrotum ([place])   . Other postprocedural status(V45.89)   . Rosacea   . S/P cholecystectomy   . Sciatica    right  . Sprain of lumbar region     Medications:  Scheduled:  . amoxicillin  500 mg Oral Q8H  . calcium-vitamin D  1 tablet Oral Q breakfast  . dextromethorphan-guaiFENesin  2 tablet Oral BID  . diltiazem  30 mg Oral Q6H  . furosemide  20 mg Intravenous BID  . metoprolol tartrate  50 mg Oral BID  . mineral oil  1 enema Rectal Once  . polyethylene glycol  17 g Oral Daily  . senna-docusate  2 tablet Oral BID  . sodium chloride HYPERTONIC  4 mL Nebulization BID  . Warfarin - Pharmacist Dosing Inpatient   Does not apply q1600    Assessment: Patient is a 71 yof  that presents to the ED with N/V/D. The patient was found to be septic and started on broad spectrum antibiotics. The patient takes warfarin PTA for afib. Pharmacy has been asked to continue dosing warfarin while inpatient.   Warfarin PTA dose: 5mg  on Monday and 2.5 ROW. Last dose PTA was 8/29 2.5 mg.  Warfarin was held on 8/30 and 8/31 due to supratherapeutic INRs from infection, resumed dosing on 9/1.  INR today is slightly supratherapeutic and trending up. CBC stable, no s/sx bleeding documented. WBC trending up since 9/2, afeb. Will give slightly lower dose of warfarin today.   Goal of Therapy:  INR 2-3 Monitor platelets by anticoagulation protocol: Yes   Plan:  Warfarin 1 mg PO x1 tonight Daily protime  Fara Olden, PharmD PGY-1 Pharmacy Resident 07/31/2020 1:27 PM Please see AMION for all pharmacy numbers

## 2020-07-31 NOTE — Progress Notes (Signed)
[facility]   Reason for visit: Follow up on cellulitis  Interval History: continued improvement.  Daughter at bedside concerned with wound care, LFTs, offloading her leg.  WBC up to 16.7.  No fever.      Physical Exam: Constitutional:  Vitals:   07/31/20 1145 07/31/20 1400  BP: 121/68 122/63  Pulse: (!) 108 90  Resp:    Temp:    SpO2:  93%   patient appears in NAD Respiratory: Normal respiratory effort; CTA B Cardiovascular: RRR MS; left leg with decreased redness, minimal warmth, much improved from previous.    Review of Systems: Constitutional: negative for fevers and chills Gastrointestinal: negative for nausea  Lab Results  Component Value Date   WBC 16.7 (H) 07/31/2020   HGB 12.0 07/31/2020   HCT 36.8 07/31/2020   MCV 91.3 07/31/2020   PLT 311 07/31/2020    Lab Results  Component Value Date   CREATININE 0.57 07/31/2020   BUN 14 07/31/2020   NA 132 (L) 07/31/2020   K 4.0 07/31/2020   CL 97 (L) 07/31/2020   CO2 27 07/31/2020    Lab Results  Component Value Date   ALT 95 (H) 07/31/2020   AST 181 (H) 07/31/2020   ALKPHOS 177 (H) 07/31/2020     Microbiology: Recent Results (from the past 240 hour(s))  Blood Culture (routine x 2)     Status: Abnormal   Collection Time: 07/25/20  3:30 PM   Specimen: BLOOD RIGHT HAND  Result Value Ref Range Status   Specimen Description BLOOD RIGHT HAND  Final   Special Requests   Final    BOTTLES DRAWN AEROBIC ONLY Blood Culture results may not be optimal due to an inadequate volume of blood received in culture bottles   Culture  Setup Time   Final    GRAM POSITIVE COCCI IN CHAINS AEROBIC BOTTLE ONLY CRITICAL RESULT CALLED TO, READ BACK BY AND VERIFIED WITH: A. Rogers Blocker PharmD 8:30 07/26/20 ([email or web address]) Performed at [facility], [place] [address] [place], [place] [address]    Culture STREPTOCOCCUS GROUP C (A)  Final   Report Status 07/28/2020 FINAL  Final   Organism ID, Bacteria  STREPTOCOCCUS GROUP C  Final      Susceptibility   Streptococcus group c - MIC*    CLINDAMYCIN <=0.25 SENSITIVE Sensitive     AMPICILLIN <=0.25 SENSITIVE Sensitive     ERYTHROMYCIN <=0.12 SENSITIVE Sensitive     VANCOMYCIN 0.25 SENSITIVE Sensitive     CEFTRIAXONE <=0.12 SENSITIVE Sensitive     LEVOFLOXACIN 1 SENSITIVE Sensitive     * STREPTOCOCCUS GROUP C  Urine culture     Status: None   Collection Time: 07/25/20  3:30 PM   Specimen: In/Out Cath Urine  Result Value Ref Range Status   Specimen Description IN/OUT CATH URINE  Final   Special Requests NONE  Final   Culture   Final    NO GROWTH Performed at [facility], [place] [address], [place],  [address]    Report Status 07/26/2020 FINAL  Final  Blood Culture ID Panel (Reflexed)     Status: Abnormal   Collection Time: 07/25/20  3:30 PM  Result Value Ref Range Status   Enterococcus faecalis NOT DETECTED NOT DETECTED Final   Enterococcus Faecium NOT DETECTED NOT DETECTED Final   Listeria monocytogenes NOT DETECTED NOT DETECTED Final   Staphylococcus species NOT DETECTED NOT DETECTED Final   Staphylococcus aureus (BCID) NOT DETECTED NOT  DETECTED Final   Staphylococcus epidermidis NOT DETECTED NOT DETECTED Final   Staphylococcus lugdunensis NOT DETECTED NOT DETECTED Final   Streptococcus species DETECTED (A) NOT DETECTED Final    Comment: Not Enterococcus species, Streptococcus agalactiae, Streptococcus pyogenes, or Streptococcus pneumoniae. CRITICAL RESULT CALLED TO, READ BACK BY AND VERIFIED WITH: A. Rogers Blocker PharmD 8:30 07/26/20 ([email or web address])    Streptococcus agalactiae NOT DETECTED NOT DETECTED Final   Streptococcus pneumoniae NOT DETECTED NOT DETECTED Final   Streptococcus pyogenes NOT DETECTED NOT DETECTED Final   A.calcoaceticus-baumannii NOT DETECTED NOT DETECTED Final   Bacteroides fragilis NOT DETECTED NOT DETECTED Final   Enterobacterales NOT DETECTED NOT DETECTED Final   Enterobacter cloacae complex NOT DETECTED  NOT DETECTED Final   Escherichia coli NOT DETECTED NOT DETECTED Final   Klebsiella aerogenes NOT DETECTED NOT DETECTED Final   Klebsiella oxytoca NOT DETECTED NOT DETECTED Final   Klebsiella pneumoniae NOT DETECTED NOT DETECTED Final   Proteus species NOT DETECTED NOT DETECTED Final   Salmonella species NOT DETECTED NOT DETECTED Final   Serratia marcescens NOT DETECTED NOT DETECTED Final   Haemophilus influenzae NOT DETECTED NOT DETECTED Final   Neisseria meningitidis NOT DETECTED NOT DETECTED Final   Pseudomonas aeruginosa NOT DETECTED NOT DETECTED Final   Stenotrophomonas maltophilia NOT DETECTED NOT DETECTED Final   Candida albicans NOT DETECTED NOT DETECTED Final   Candida auris NOT DETECTED NOT DETECTED Final   Candida glabrata NOT DETECTED NOT DETECTED Final   Candida krusei NOT DETECTED NOT DETECTED Final   Candida parapsilosis NOT DETECTED NOT DETECTED Final   Candida tropicalis NOT DETECTED NOT DETECTED Final   Cryptococcus neoformans/gattii NOT DETECTED NOT DETECTED Final    Comment: Performed at [facility], [address] [place], [place] [address]  SARS Coronavirus 2 by RT PCR (hospital order, performed in [facility] hospital lab) Nasopharyngeal Nasopharyngeal Swab     Status: None   Collection Time: 07/25/20  3:33 PM   Specimen: Nasopharyngeal Swab  Result Value Ref Range Status   SARS Coronavirus 2 NEGATIVE NEGATIVE Final    Comment: (NOTE) SARS-CoV-2 target nucleic acids are NOT DETECTED.  The SARS-CoV-2 RNA is generally detectable in upper and lower respiratory specimens during the acute phase of infection. The lowest concentration of SARS-CoV-2 viral copies this assay can detect is 250 copies / mL. A negative result does not preclude SARS-CoV-2 infection and should not be used as the sole basis for treatment or other patient management decisions.  A negative result may occur with improper specimen collection / handling, submission of specimen other than  nasopharyngeal swab, presence of viral mutation(s) within the areas targeted by this assay, and inadequate number of viral copies (<250 copies / mL). A negative result must be combined with clinical observations, patient history, and epidemiological information.  Fact Sheet for Patients:   StrictlyIdeas.no  Fact Sheet for Healthcare Providers: BankingDealers.co.za  This test is not yet approved or  cleared by the [place] FDA and has been authorized for detection and/or diagnosis of SARS-CoV-2 by FDA under an Emergency Use Authorization (EUA).  This EUA will remain in effect (meaning this test can be used) for the duration of the COVID-19 declaration under Section 564(b)(1) of the Act, 21 U.S.C. section 360bbb-3(b)(1), unless the authorization is terminated or revoked sooner.  Performed at [facility], [place] [address], [place],  [address]   MRSA PCR Screening     Status: None   Collection Time: 07/28/20  4:32 PM  Specimen: Nasal Mucosa; Nasopharyngeal  Result Value Ref Range Status   MRSA by PCR NEGATIVE NEGATIVE Final    Comment:        The GeneXpert MRSA Assay (FDA approved for NASAL specimens only), is one component of a comprehensive MRSA colonization surveillance program. It is not intended to diagnose MRSA infection nor to guide or monitor treatment for MRSA infections. Performed at [facility], [place] [address] [place], [place] [address]     Impression/Plan:  1. Cellulitis - improving on amoxicillin and will continue.  I would treat another 7 days and if looks ok at that time, can stop.  I discussed with the daughter there will likely be some residual redness and vascular changes.    2. Wound - asked by daughter about wound care and can do wet to dry as needed daily.  Order placed for nursing to do when able.   3.  transaminitis - unclear etiology. Mild increase at baseline but has increased  since.  [place] with concerns with cirrhosis though CT previously did not mention it.  No fatty liver noted.  Hepatitis panel being checked.  ? If it is a medication effect with cardizem.    4.  Leukocytosis - likely from #1 and ongoing afib.  No new concerning signs.    I will sign off, call with questions.

## 2020-07-31 NOTE — Progress Notes (Signed)
Inpatient Rehab Admissions Coordinator:   Met with patient and daughter at bedside to discuss potential CIR admission. Pt. Stated interest. Will pursue for potential admit next week, pending bed availability.   Clemens Catholic, [place], [place] Admissions Coordinator  [phone] ([place]) [phone] (office)

## 2020-07-31 NOTE — Progress Notes (Addendum)
Electrophysiology Rounding Note  Patient Name: Sapna Padron [facility] Date of Encounter: 07/31/2020  Primary Cardiologist: No primary care provider on file. Electrophysiologist: Cristopher Peru, MD   Subjective   Without complaints but sob   Inpatient Medications    Scheduled Meds: . amoxicillin  500 mg Oral Q8H  . calcium-vitamin D  1 tablet Oral Q breakfast  . dextromethorphan-guaiFENesin  2 tablet Oral BID  . diltiazem  30 mg Oral Q6H  . furosemide  20 mg Intravenous BID  . metoprolol tartrate  50 mg Oral BID  . mineral oil  1 enema Rectal Once  . polyethylene glycol  17 g Oral Daily  . senna-docusate  2 tablet Oral BID  . sodium chloride HYPERTONIC  4 mL Nebulization BID  . Warfarin - Pharmacist Dosing Inpatient   Does not apply q1600   Continuous Infusions:  PRN Meds: ALPRAZolam, bisacodyl, iohexol, magic mouthwash w/lidocaine, metoprolol tartrate, ondansetron (ZOFRAN) IV, polyvinyl alcohol   Vital Signs    Vitals:   07/31/20 0000 07/31/20 0628 07/31/20 0700 07/31/20 0747  BP: 108/62  121/75 118/68  Pulse: 90   88  Resp: 15 18  18   Temp: 98.8 F (37.1 C) 97.8 F (36.6 C)  98.8 F (37.1 C)  TempSrc: Oral Oral  Oral  SpO2: 95%     Weight:  79.6 kg    Height:        Intake/Output Summary (Last 24 hours) at 07/31/2020 0820 Last data filed at 07/31/2020 7169 Gross per 24 hour  Intake 240 ml  Output 2250 ml  Net -2010 ml   Filed Weights   07/28/20 0700 07/30/20 0635 07/31/20 0628  Weight: 88.5 kg 81 kg 79.6 kg    Physical Exam    Well developed and nourished in mod acute distress on O2 HENT normal Neck supple   Crackles  Irregularly irregular rate and rhythm with controlled  ventricular response, no murmurs or gallops No Clubbing cyanosis edema Skin-warm and dry//cellulitis A & Oriented  Grossly normal sensory and motor function   Labs    CBC Recent Labs    07/30/20 1735 07/31/20 0717  WBC 15.7* 16.7*  NEUTROABS 12.8* PENDING  HGB 13.1 12.0  HCT  40.6 36.8  MCV 92.3 91.3  PLT 239 678   Basic Metabolic Panel Recent Labs    07/29/20 0414 07/30/20 1735  NA 136 134*  K 4.0 4.0  CL 101 99  CO2 25 24  GLUCOSE 130* 136*  BUN 11 14  CREATININE 0.64 0.62  CALCIUM 8.8* 8.7*   Liver Function Tests Recent Labs    07/29/20 0414 07/30/20 1735  AST 129* 277*  ALT 78* 118*  ALKPHOS 135* 195*  BILITOT 1.3* 1.6*  PROT 5.7* 6.1*  ALBUMIN 1.8* 1.8*   No results for input(s): LIPASE, AMYLASE in the last 72 hours. Cardiac Enzymes No results for input(s): CKTOTAL, CKMB, CKMBINDEX, TROPONINI in the last 72 hours.   Telemetry    Atrial fibrillation, rates gradually improved to 70s on diltiazem (personally reviewed)  Radiology    ECHOCARDIOGRAM COMPLETE  Result Date: 07/29/2020    ECHOCARDIOGRAM REPORT   Patient Name:   MAHEK SCHLESINGER [facility] Date of Exam: 07/29/2020 Medical Rec #:  [id number]      Height:       64.0 in Accession #:    [id number]     Weight:       195.1 lb Date of Birth:  04-27-1931      BSA:  1.936 m Patient Age:    86 years       BP:           100/61 mmHg Patient Gender: F              HR:           85 bpm. Exam Location:  Inpatient Procedure: 2D Echo Indications:    atrial fibrillation  History:        Patient has no prior history of Echocardiogram examinations.                 Risk Factors:Hypertension.  Sonographer:    Johny Chess Referring Phys: [id number] Alex  1. Left ventricular ejection fraction, by estimation, is 60 to 65%. The left ventricle has normal function. The left ventricle has no regional wall motion abnormalities. Left ventricular diastolic parameters are consistent with age-related delayed relaxation (normal).  2. Right ventricular systolic function is normal. The right ventricular size is normal. There is normal pulmonary artery systolic pressure.  3. Left atrial size was mildly dilated.  4. The mitral valve is degenerative. Mild mitral valve regurgitation. No evidence of mitral  stenosis.  5. The aortic valve is tricuspid. Aortic valve regurgitation is trivial. Mild to moderate aortic valve sclerosis/calcification is present, without any evidence of aortic stenosis.  6. The inferior vena cava is normal in size with greater than 50% respiratory variability, suggesting right atrial pressure of 3 mmHg. FINDINGS  Left Ventricle: Left ventricular ejection fraction, by estimation, is 60 to 65%. The left ventricle has normal function. The left ventricle has no regional wall motion abnormalities. The left ventricular internal cavity size was normal in size. There is  no left ventricular hypertrophy. Left ventricular diastolic parameters are consistent with age-related delayed relaxation (normal). Right Ventricle: The right ventricular size is normal. No increase in right ventricular wall thickness. Right ventricular systolic function is normal. There is normal pulmonary artery systolic pressure. The tricuspid regurgitant velocity is 2.13 m/s, and  with an assumed right atrial pressure of 10 mmHg, the estimated right ventricular systolic pressure is 29.4 mmHg. Left Atrium: Left atrial size was mildly dilated. Right Atrium: Right atrial size was normal in size. Pericardium: There is no evidence of pericardial effusion. Mitral Valve: The mitral valve is degenerative in appearance. There is moderate thickening of the mitral valve leaflet(s). There is moderate calcification of the mitral valve leaflet(s). Normal mobility of the mitral valve leaflets. Moderate mitral annular calcification. Mild mitral valve regurgitation. No evidence of mitral valve stenosis. Tricuspid Valve: The tricuspid valve is normal in structure. Tricuspid valve regurgitation is mild . No evidence of tricuspid stenosis. Aortic Valve: The aortic valve is tricuspid. Aortic valve regurgitation is trivial. Mild to moderate aortic valve sclerosis/calcification is present, without any evidence of aortic stenosis. Pulmonic Valve: The  pulmonic valve was normal in structure. Pulmonic valve regurgitation is mild. No evidence of pulmonic stenosis. Aorta: The aortic root is normal in size and structure. Venous: The inferior vena cava is normal in size with greater than 50% respiratory variability, suggesting right atrial pressure of 3 mmHg. IAS/Shunts: No atrial level shunt detected by color flow Doppler.  LEFT VENTRICLE PLAX 2D LVIDd:         4.10 cm LVIDs:         3.00 cm LV PW:         0.80 cm LV IVS:        0.70 cm LVOT diam:  1.90 cm LV SV:         55 LV SV Index:   29 LVOT Area:     2.84 cm  IVC IVC diam: 2.15 cm LEFT ATRIUM             Index       RIGHT ATRIUM           Index LA diam:        3.90 cm 2.01 cm/m  RA Area:     19.10 cm LA Vol (A2C):   79.9 ml 41.27 ml/m RA Volume:   50.30 ml  25.98 ml/m LA Vol (A4C):   84.0 ml 43.39 ml/m LA Biplane Vol: 85.2 ml 44.01 ml/m  AORTIC VALVE LVOT Vmax:   115.00 cm/s LVOT Vmean:  76.100 cm/s LVOT VTI:    0.195 m  AORTA Ao Root diam: 2.70 cm Ao Asc diam:  3.00 cm TRICUSPID VALVE TR Peak grad:   18.1 mmHg TR Vmax:        213.00 cm/s  SHUNTS Systemic VTI:  0.20 m Systemic Diam: 1.90 cm Jenkins Rouge MD Electronically signed by Jenkins Rouge MD Signature Date/Time: 07/29/2020/4:21:32 PM    Final     Patient Profile     ELLYSSA ZAGAL is a 86 y.o. female with a history of AF, HTN, chronic diastolic CHF, essential HTN, HDL, and anxiety admitted for cellulitis -> bacteremia who is being seen for the evaluation of afib with RVR at the request of Dr. Nevada Crane.  Assessment & Plan    1.  AF with RVR   2. HTN  3. Strep group C bacteremia  Hepatitis   Hypoalbuminemia  Leukocytosis     Will continue short acting CCB through the weekend--her situation is sufficiently unstable that it may be of benefit to be able to stop  Stressed the need to OOB to avoid bedrest/weakness  She asked if she would survive this episode.  I suggested that as things were getting better, unless there were  unexpected turn, that survival was likely.  This then begged the conversation regarding CodeStatus,  Her daughter, Chauncey Reading, said she was a full code to which her mother replied, "and that is that/."      Signed, Virl Axe, MD  07/31/2020, 8:20 AM

## 2020-07-31 NOTE — Progress Notes (Addendum)
PROGRESS NOTE  [name] [name] WUJ:[id number] DOB: 1931-07-29 DOA: 07/25/2020 PCP: Hoyt Koch, MD  HPI/Recap of past 80 hours: 86 year old Caucasian female with a past medical history of permanent atrial fibrillation on warfarin, chronic diastolic CHF, essential hypertension, hyperlipidemia and chronic anxiety who comes in with left leg pain nausea vomiting and diarrhea.  Met SIRS criteria and noted to have left lower extremity cellulitis.  Started on empiric IV antibiotics.  TRH asked to admit for management of sepsis secondary to left lower extremity cellulitis.   Hospital course complicated by A. fib with RVR refractory to oral and IV beta-blocker.  Received a bolus of Cardizem infusion and was started on Cardizem drip.  Cardiology consulted on 07/29/2020 to assist with the management.  Heart rate improved with addition of Cardizem drip, switched to p.o. Cardizem on 2020-07-31. Infectious disease has been consulted at patient's daughter request and following.  Hospital course also complicated by acute transaminitis for which a complete abdomen ultrasound was completed on 2020-07-31, revealing suspicious for cirrhosis, no liver masses, chronic dilatation of the common bile duct.  No acute findings.  GI curbside.  07/31/20: Seen and examined at her bedside.  Weak appearing with improved conversational dyspnea.  Coughing less.  Denies chest pain.     Assessment/Plan: Principal Problem:   Sepsis ([place]) Active Problems:   Hyperlipidemia   Chronic diastolic heart failure (HCC)   Cellulitis of left lower extremity   Atrial fibrillation with rapid ventricular response (HCC)   Hypotension   Lung nodule   Pressure injury of skin  Severe sepsis, secondary to left lower extremity cellulitis/strep group C bacteremia Presented with lactic acidosis, 6.7, tachycardia, fever and left lower extremity cellulitis. Initially on IV vancomycin, cefepime and Flagyl. Blood cultures drawn on 07/25/2020  + for Streptococcus group C Lactic acidosis has resolved, afebrile Cefepime switched to Rocephin on 07/26/2020, Rocephin switched to unasyn on 07/28/20. Started on Augmentin on 07/29/2020 per ID. No DVT noted on Doppler studies.   Infectious disease consulted at patient's daughter's request. Worsening leukocytosis and neutrophilia Repeat CBC with differentials in the morning  Suspected dysphagia Per bedside RN, noted cough after drinking water Speech therapist consulted Aspiration precaution in place Elevated procalcitonin and worsening leukocytosis with neutrophilia on blood work this morning, will obtain chest x-ray to rule out aspiration. Follow speech therapist recommendations.  Acute transaminitis, unclear etiology Possible cirrhosis on abdominal ultrasound Chronic common bile duct dilatation on ultrasound Trend LFTs  Acute hepatitis panel pending Hold and Tylenol Avoid hepatotoxic agents GI curbside  Strep groupC bacteremia, likely from skin source, left lower extremity cellulitis. From blood culture drawn on 07/25/2020, positive for Streptococcus group C, pansensitive Repeat blood cultures peripherally x2 on 2020-07-31 Management as stated above  Permanent A. fib with RVR, RVR resolved on cardizem and lopressor, suspect related to lung physiology Currently is on oral Lopressor 50 mg twice daily Received IV Cardizem bolus and Cardizem drip, discontinued on 07/30/2020 Started on p.o. Cardizem 30 mg every 6 hours per cardiology on 2020-07-31 She is on warfarin for CVA prevention  TSH normal 2D echo done on 07/29/2020 showed normal LVEF 60 to 65% with mildly dilated left atrium. Continue to closely monitor on telemetry Rate is now controlled, repeat 12 lead EKG showing rate controlled A. Fib.  Supratherapeutic INR Goal INR between 2 and 3 INR 3.1 Appreciate pharmacy's assistance  Resolving acute hypoxic respiratory failure secondary to pulmonary edema seen on chest x-ray, likely  from volume overload BNP mildly elevated greater  than 300 Not on oxygen supplementation at baseline Continue IV diuresing as tolerated Continue to maintain O2 saturation greater than 92% Will need home oxygen evaluation prior to DC Continue bronchodilators Xopenex nebs and Atrovent nebs 3 times daily Continue pulmonary toilet, Mucinex, hypersaline nebs, chest PT Encourage use of incentive spirometer  Resolved AKI Baseline creatinine appears to be 0.5 with GFR greater than 60 Presented with creatinine of 1.1 She is currently back to her baseline creatinine. Continue to avoid nephrotoxins Monitor urine output 2.2 L urine output recorded in the last 24 hours  Resolved constipation Continue bowel regimen.  Resolved post repletion: Hypokalemia and hypomagnesemia Serum potassium 4.1, serum magnesium 2.2. Replete as indicated  Right upper lobe lung nodule Incidental finding.  Outpatient monitoring.  Hyperlipidemia Hold statin due to acute transaminitis  Recent eye surgery Continue home eyedrops  Physical debility PT recommended CIR CIR consulted for evaluation and possible placement Continue fall precautions  Stage 2 pressure injury of left buttock/posterior left knee lesion, POA Local wound care Wound care specialist consulted  Discussion with family: Spent a minimum of 25 mins talking with patient's daughter in the room, explaining lab results and imaging, and going over plan and answering questions.    DVT Prophylaxis:  Warfarin Code Status: Full code Family Communication: Discussed with the patient's daughter at bedside. Disposition Plan:     Status is: Inpatient    Dispo:  Patient From: Home  Planned Disposition: Inpatient Rehab  Expected discharge date: 08/02/20  Medically stable for discharge: No, ongoing management for left lower extremity cellulitis and permanent A. fib with RVR.         Objective: Vitals:   07/31/20 0856 07/31/20 0858  07/31/20 1145 07/31/20 1400  BP:   121/68 122/63  Pulse:   (!) 108 90  Resp:      Temp:      TempSrc:      SpO2: 95% 93%  93%  Weight:      Height:        Intake/Output Summary (Last 24 hours) at 07/31/2020 1530 Last data filed at 07/31/2020 1400 Gross per 24 hour  Intake --  Output 2400 ml  Net -2400 ml   Filed Weights   07/28/20 0700 07/30/20 0635 07/31/20 0628  Weight: 88.5 kg 81 kg 79.6 kg    Exam:  . General: 86 y.o. year-old female well-developed well-nourished in no acute distress.  Alert and interactive.   . Cardiovascular: Irregular rate and rhythm no rubs or gallops. Marland Kitchen Respiratory: Mild rales at bases no wheezing noted.  Poor inspiratory effort.   . Abdomen: Soft nontender normal bowel sounds present. . Musculoskeletal: Left lower extremity erythema warmth and tenderness improved from prior.  Marland Kitchen Psychiatry: Mood is appropriate for condition and setting.  Data Reviewed: CBC: Recent Labs  Lab 07/25/20 1346 07/26/20 1155 07/27/20 0357 07/28/20 0507 07/29/20 0414 07/30/20 1735 07/31/20 0717  WBC 5.3   < > 8.3 9.0 11.7* 15.7* 16.7*  NEUTROABS 3.8  --   --   --   --  12.8* 15.0*  HGB 16.4*   < > 12.7 13.4 13.9 13.1 12.0  HCT 48.8*   < > 38.0 41.6 42.6 40.6 36.8  MCV 91.2   < > 91.8 93.7 92.6 92.3 91.3  PLT 180   < > 145* 134* 172 239 311   < > = values in this interval not displayed.   Basic Metabolic Panel: Recent Labs  Lab 07/27/20 0357 07/28/20 0507 07/29/20 0414 07/30/20  1735 07/31/20 0717  NA 136 136 136 134* 132*  K 3.6 4.1 4.0 4.0 4.0  CL 104 105 101 99 97*  CO2 22 20* '25 24 27  ' GLUCOSE 128* 116* 130* 136* 135*  BUN '20 19 11 14 14  ' CREATININE 0.74 0.63 0.64 0.62 0.57  CALCIUM 8.6* 8.7* 8.8* 8.7* 8.4*  MG 1.6* 2.2  --   --   --    GFR: Estimated Creatinine Clearance: 51.6 mL/min (by C-G formula based on SCr of 0.57 mg/dL). Liver Function Tests: Recent Labs  Lab 07/25/20 1346 07/29/20 0414 07/30/20 1735 07/31/20 0717  AST 51* 129* 277*  181*  ALT 31 78* 118* 95*  ALKPHOS 88 135* 195* 177*  BILITOT QUANTITY NOT SUFFICIENT, UNABLE TO PERFORM TEST 1.3* 1.6* 1.5*  PROT 7.0 5.7* 6.1* 5.7*  ALBUMIN 3.4* 1.8* 1.8* 1.6*   Recent Labs  Lab 07/31/20 0717  LIPASE 78*   No results for input(s): AMMONIA in the last 168 hours. Coagulation Profile: Recent Labs  Lab 07/27/20 0357 07/28/20 0507 07/29/20 0414 07/30/20 1735 07/31/20 0717  INR 3.7* 3.0* 2.4* 2.7* 3.1*   Cardiac Enzymes: No results for input(s): CKTOTAL, CKMB, CKMBINDEX, TROPONINI in the last 168 hours. BNP (last 3 results) No results for input(s): PROBNP in the last 8760 hours. HbA1C: Recent Labs    07/31/20 0717  HGBA1C 6.8*   CBG: No results for input(s): GLUCAP in the last 168 hours. Lipid Profile: No results for input(s): CHOL, HDL, LDLCALC, TRIG, CHOLHDL, LDLDIRECT in the last 72 hours. Thyroid Function Tests: Recent Labs    07/28/20 1915  TSH 3.776   Anemia Panel: No results for input(s): VITAMINB12, FOLATE, FERRITIN, TIBC, IRON, RETICCTPCT in the last 72 hours. Urine analysis:    Component Value Date/Time   COLORURINE YELLOW 07/25/2020 1758   APPEARANCEUR CLEAR 07/25/2020 1758   LABSPEC 1.018 07/25/2020 1758   PHURINE 6.0 07/25/2020 1758   GLUCOSEU NEGATIVE 07/25/2020 1758   HGBUR NEGATIVE 07/25/2020 1758   BILIRUBINUR NEGATIVE 07/25/2020 1758   KETONESUR NEGATIVE 07/25/2020 1758   PROTEINUR NEGATIVE 07/25/2020 1758   NITRITE NEGATIVE 07/25/2020 1758   LEUKOCYTESUR NEGATIVE 07/25/2020 1758   Sepsis Labs: '@LABRCNTIP' (procalcitonin:4,lacticidven:4)  ) Recent Results (from the past 240 hour(s))  Blood Culture (routine x 2)     Status: Abnormal   Collection Time: 07/25/20  3:30 PM   Specimen: BLOOD RIGHT HAND  Result Value Ref Range Status   Specimen Description BLOOD RIGHT HAND  Final   Special Requests   Final    BOTTLES DRAWN AEROBIC ONLY Blood Culture results may not be optimal due to an inadequate volume of blood received in  culture bottles   Culture  Setup Time   Final    GRAM POSITIVE COCCI IN CHAINS AEROBIC BOTTLE ONLY CRITICAL RESULT CALLED TO, READ BACK BY AND VERIFIED WITH: A. Rogers Blocker PharmD 8:30 07/26/20 ([email or web address]) Performed at [place] [facility], [place] [address] [place], [place] [address]    Culture STREPTOCOCCUS GROUP C (A)  Final   Report Status 07/28/2020 FINAL  Final   Organism ID, Bacteria STREPTOCOCCUS GROUP C  Final      Susceptibility   Streptococcus group c - MIC*    CLINDAMYCIN <=0.25 SENSITIVE Sensitive     AMPICILLIN <=0.25 SENSITIVE Sensitive     ERYTHROMYCIN <=0.12 SENSITIVE Sensitive     VANCOMYCIN 0.25 SENSITIVE Sensitive     CEFTRIAXONE <=0.12 SENSITIVE Sensitive     LEVOFLOXACIN 1 SENSITIVE Sensitive     *  STREPTOCOCCUS GROUP C  Urine culture     Status: None   Collection Time: 07/25/20  3:30 PM   Specimen: In/Out Cath Urine  Result Value Ref Range Status   Specimen Description IN/OUT CATH URINE  Final   Special Requests NONE  Final   Culture   Final    NO GROWTH Performed at [facility], [place] [address], [place], [place] [address]    Report Status 07/26/2020 FINAL  Final  Blood Culture ID Panel (Reflexed)     Status: Abnormal   Collection Time: 07/25/20  3:30 PM  Result Value Ref Range Status   Enterococcus faecalis NOT DETECTED NOT DETECTED Final   Enterococcus Faecium NOT DETECTED NOT DETECTED Final   Listeria monocytogenes NOT DETECTED NOT DETECTED Final   Staphylococcus species NOT DETECTED NOT DETECTED Final   Staphylococcus aureus (BCID) NOT DETECTED NOT DETECTED Final   Staphylococcus epidermidis NOT DETECTED NOT DETECTED Final   Staphylococcus lugdunensis NOT DETECTED NOT DETECTED Final   Streptococcus species DETECTED (A) NOT DETECTED Final    Comment: Not Enterococcus species, Streptococcus agalactiae, Streptococcus pyogenes, or Streptococcus pneumoniae. CRITICAL RESULT CALLED TO, READ BACK BY AND VERIFIED WITH: A. Rogers Blocker PharmD 8:30 07/26/20 ([email or web address])     Streptococcus agalactiae NOT DETECTED NOT DETECTED Final   Streptococcus pneumoniae NOT DETECTED NOT DETECTED Final   Streptococcus pyogenes NOT DETECTED NOT DETECTED Final   A.calcoaceticus-baumannii NOT DETECTED NOT DETECTED Final   Bacteroides fragilis NOT DETECTED NOT DETECTED Final   Enterobacterales NOT DETECTED NOT DETECTED Final   Enterobacter cloacae complex NOT DETECTED NOT DETECTED Final   Escherichia coli NOT DETECTED NOT DETECTED Final   Klebsiella aerogenes NOT DETECTED NOT DETECTED Final   Klebsiella oxytoca NOT DETECTED NOT DETECTED Final   Klebsiella pneumoniae NOT DETECTED NOT DETECTED Final   Proteus species NOT DETECTED NOT DETECTED Final   Salmonella species NOT DETECTED NOT DETECTED Final   Serratia marcescens NOT DETECTED NOT DETECTED Final   Haemophilus influenzae NOT DETECTED NOT DETECTED Final   Neisseria meningitidis NOT DETECTED NOT DETECTED Final   Pseudomonas aeruginosa NOT DETECTED NOT DETECTED Final   Stenotrophomonas maltophilia NOT DETECTED NOT DETECTED Final   Candida albicans NOT DETECTED NOT DETECTED Final   Candida auris NOT DETECTED NOT DETECTED Final   Candida glabrata NOT DETECTED NOT DETECTED Final   Candida krusei NOT DETECTED NOT DETECTED Final   Candida parapsilosis NOT DETECTED NOT DETECTED Final   Candida tropicalis NOT DETECTED NOT DETECTED Final   Cryptococcus neoformans/gattii NOT DETECTED NOT DETECTED Final    Comment: Performed at [facility], [address], [place], [place] [address]  SARS Coronavirus 2 by RT PCR (hospital order, performed in [facility] hospital lab) Nasopharyngeal Nasopharyngeal Swab     Status: None   Collection Time: 07/25/20  3:33 PM   Specimen: Nasopharyngeal Swab  Result Value Ref Range Status   SARS Coronavirus 2 NEGATIVE NEGATIVE Final    Comment: (NOTE) SARS-CoV-2 target nucleic acids are NOT DETECTED.  The SARS-CoV-2 RNA is generally detectable in upper and lower respiratory specimens during  the acute phase of infection. The lowest concentration of SARS-CoV-2 viral copies this assay can detect is 250 copies / mL. A negative result does not preclude SARS-CoV-2 infection and should not be used as the sole basis for treatment or other patient management decisions.  A negative result may occur with improper specimen collection / handling, submission of specimen other than nasopharyngeal swab, presence of viral mutation(s) within  the areas targeted by this assay, and inadequate number of viral copies (<250 copies / mL). A negative result must be combined with clinical observations, patient history, and epidemiological information.  Fact Sheet for Patients:   StrictlyIdeas.no  Fact Sheet for Healthcare Providers: BankingDealers.co.za  This test is not yet approved or  cleared by the [place] FDA and has been authorized for detection and/or diagnosis of SARS-CoV-2 by FDA under an Emergency Use Authorization (EUA).  This EUA will remain in effect (meaning this test can be used) for the duration of the COVID-19 declaration under Section 564(b)(1) of the Act, 21 U.S.C. section 360bbb-3(b)(1), unless the authorization is terminated or revoked sooner.  Performed at [facility], [place] [address], [place], [place] [address]   MRSA PCR Screening     Status: None   Collection Time: 07/28/20  4:32 PM   Specimen: Nasal Mucosa; Nasopharyngeal  Result Value Ref Range Status   MRSA by PCR NEGATIVE NEGATIVE Final    Comment:        The GeneXpert MRSA Assay (FDA approved for NASAL specimens only), is one component of a comprehensive MRSA colonization surveillance program. It is not intended to diagnose MRSA infection nor to guide or monitor treatment for MRSA infections. Performed at [facility], [place] [address], [place], [place] [address]       Studies: US Abdomen Complete  Result Date: 07/31/2020 CLINICAL DATA:   Transaminitis. EXAM: ABDOMEN ULTRASOUND COMPLETE COMPARISON:  CT, 07/25/2020 FINDINGS: Gallbladder: Surgically absent Common bile duct: Diameter: 9 mm Liver: Coarsened liver echotexture. Subtle surface nodularity. No defined mass. Calcification noted along the inferior margin of the liver stable from the prior CT. Portal vein is patent on color Doppler imaging with normal direction of blood flow towards the liver. IVC: No abnormality visualized. Pancreas: Mostly obscured. Spleen: Size and appearance within normal limits. Right Kidney: Length: 11.5 cm. Echogenicity within normal limits. No mass or hydronephrosis visualized. Left Kidney: Length: 11.1 cm. Echogenicity within normal limits. No mass or hydronephrosis visualized. Abdominal aorta: No aneurysm visualized. Other findings: None. IMPRESSION: 1. Coarsened liver echotexture with subtle surface nodularity. Findings support cirrhosis in the proper clinical setting. 2. No liver masses. 3. Status post cholecystectomy. Chronic dilation of the common bile duct. 4. No acute findings. Electronically Signed   By: Lajean Manes M.D.   On: 07/31/2020 11:45    Scheduled Meds: . amoxicillin  500 mg Oral Q8H  . calcium-vitamin D  1 tablet Oral Q breakfast  . dextromethorphan-guaiFENesin  2 tablet Oral BID  . diltiazem  30 mg Oral Q6H  . feeding supplement (ENSURE ENLIVE)  237 mL Oral BID BM  . furosemide  20 mg Intravenous BID  . ipratropium  0.5 mg Nebulization BID  . levalbuterol  0.63 mg Nebulization BID  . metoprolol tartrate  50 mg Oral BID  . mineral oil  1 enema Rectal Once  . polyethylene glycol  17 g Oral Daily  . senna-docusate  2 tablet Oral BID  . sodium chloride HYPERTONIC  4 mL Nebulization BID  . warfarin  1 mg Oral ONCE-1600  . Warfarin - Pharmacist Dosing Inpatient   Does not apply q1600    Continuous Infusions:    LOS: 6 days     Kayleen Memos, MD [facility] Pager [phone]  If 7PM-7AM, please contact  night-coverage [email or web address] Password [email or web address] 07/31/2020, 3:30 PM

## 2020-08-01 DIAGNOSIS — R935 Abnormal findings on diagnostic imaging of other abdominal regions, including retroperitoneum: Secondary | ICD-10-CM

## 2020-08-01 DIAGNOSIS — R7989 Other specified abnormal findings of blood chemistry: Secondary | ICD-10-CM

## 2020-08-01 LAB — CBC WITH DIFFERENTIAL/PLATELET
Abs Immature Granulocytes: 0.77 10*3/uL — ABNORMAL HIGH (ref 0.00–0.07)
Basophils Absolute: 0.2 10*3/uL — ABNORMAL HIGH (ref 0.0–0.1)
Basophils Relative: 1 %
Eosinophils Absolute: 0.1 10*3/uL (ref 0.0–0.5)
Eosinophils Relative: 1 %
HCT: 36.8 % (ref 36.0–46.0)
Hemoglobin: 12.3 g/dL (ref 12.0–15.0)
Immature Granulocytes: 5 %
Lymphocytes Relative: 8 %
Lymphs Abs: 1.3 10*3/uL (ref 0.7–4.0)
MCH: 30.6 pg (ref 26.0–34.0)
MCHC: 33.4 g/dL (ref 30.0–36.0)
MCV: 91.5 fL (ref 80.0–100.0)
Monocytes Absolute: 0.5 10*3/uL (ref 0.1–1.0)
Monocytes Relative: 3 %
Neutro Abs: 13.2 10*3/uL — ABNORMAL HIGH (ref 1.7–7.7)
Neutrophils Relative %: 82 %
Platelets: 393 10*3/uL (ref 150–400)
RBC: 4.02 MIL/uL (ref 3.87–5.11)
RDW: 14.1 % (ref 11.5–15.5)
WBC: 16.1 10*3/uL — ABNORMAL HIGH (ref 4.0–10.5)
nRBC: 0 % (ref 0.0–0.2)

## 2020-08-01 LAB — COMPREHENSIVE METABOLIC PANEL
ALT: 77 U/L — ABNORMAL HIGH (ref 0–44)
AST: 112 U/L — ABNORMAL HIGH (ref 15–41)
Albumin: 1.6 g/dL — ABNORMAL LOW (ref 3.5–5.0)
Alkaline Phosphatase: 188 U/L — ABNORMAL HIGH (ref 38–126)
Anion gap: 7 (ref 5–15)
BUN: 12 mg/dL (ref 8–23)
CO2: 28 mmol/L (ref 22–32)
Calcium: 8.5 mg/dL — ABNORMAL LOW (ref 8.9–10.3)
Chloride: 99 mmol/L (ref 98–111)
Creatinine, Ser: 0.43 mg/dL — ABNORMAL LOW (ref 0.44–1.00)
GFR calc Af Amer: >60 mL/min (ref >60)
GFR calc non Af Amer: >60 mL/min (ref >60)
Glucose, Bld: 141 mg/dL — ABNORMAL HIGH (ref 70–99)
Potassium: 3.8 mmol/L (ref 3.5–5.1)
Sodium: 134 mmol/L — ABNORMAL LOW (ref 135–145)
Total Bilirubin: 1.5 mg/dL — ABNORMAL HIGH (ref 0.3–1.2)
Total Protein: 5.8 g/dL — ABNORMAL LOW (ref 6.5–8.1)

## 2020-08-01 LAB — VITAMIN D 25 HYDROXY (VIT D DEFICIENCY, FRACTURES): Vit D, 25-Hydroxy: 29.19 ng/mL — ABNORMAL LOW (ref 30–100)

## 2020-08-01 LAB — PROCALCITONIN: Procalcitonin: 0.82 ng/mL

## 2020-08-01 LAB — PROTIME-INR
INR: 2.7 — ABNORMAL HIGH (ref 0.8–1.2)
Prothrombin Time: 27.4 seconds — ABNORMAL HIGH (ref 11.4–15.2)

## 2020-08-01 LAB — VITAMIN B12: Vitamin B-12: 2091 pg/mL — ABNORMAL HIGH (ref 180–914)

## 2020-08-01 MED ORDER — WARFARIN SODIUM 2.5 MG PO TABS
2.5000 mg | ORAL_TABLET | Freq: Once | ORAL | Status: AC
  Administered 2020-08-01: 2.5 mg via ORAL
  Filled 2020-08-01: qty 1

## 2020-08-01 MED ORDER — ACETAMINOPHEN 500 MG PO TABS
500.0000 mg | ORAL_TABLET | Freq: Four times a day (QID) | ORAL | Status: DC | PRN
  Administered 2020-08-01 – 2020-08-09 (×13): 500 mg via ORAL
  Filled 2020-08-01 (×14): qty 1

## 2020-08-01 MED ORDER — CALCIUM CARBONATE-VITAMIN D 500-200 MG-UNIT PO TABS: 1.0000 | ORAL_TABLET | Freq: Every day | ORAL | Status: DC

## 2020-08-01 MED ORDER — DOCUSATE SODIUM 100 MG PO CAPS
100.0000 mg | ORAL_CAPSULE | Freq: Two times a day (BID) | ORAL | Status: DC
  Administered 2020-08-01 – 2020-08-11 (×21): 100 mg via ORAL
  Filled 2020-08-01 (×24): qty 1

## 2020-08-01 MED ORDER — CALCIUM CARBONATE-VITAMIN D 500-200 MG-UNIT PO TABS
1.0000 | ORAL_TABLET | Freq: Every day | ORAL | Status: DC
  Administered 2020-08-01 – 2020-08-11 (×11): 1 via ORAL
  Filled 2020-08-01 (×11): qty 1

## 2020-08-01 NOTE — Progress Notes (Addendum)
PROGRESS NOTE  [name] [name] [facility] BWG:[id number] DOB: 1931-05-22 DOA: 07/25/2020 PCP: Hoyt Koch, MD  HPI/Recap of past 58 hours: 86 year old Caucasian female with a past medical history of permanent atrial fibrillation on warfarin, chronic diastolic CHF, essential hypertension, hyperlipidemia and chronic anxiety who comes in with left leg pain nausea vomiting and diarrhea.  Met SIRS criteria and noted to have left lower extremity cellulitis.  Started on empiric IV antibiotics.  TRH asked to admit for management of sepsis secondary to left lower extremity cellulitis.   Hospital course complicated by A. fib with RVR refractory to oral and IV beta-blocker.  Received a bolus of Cardizem infusion and was started on Cardizem drip.  Cardiology consulted on 07/29/2020 to assist with the management.  Heart rate improved with addition of Cardizem drip, switched to p.o. Cardizem on 2020-07-31. Infectious disease has been consulted at patient's daughter request and following.  Hospital course also complicated by acute transaminitis for which a complete abdomen ultrasound was completed on 2020-07-31, revealing suspicious for cirrhosis, no liver masses, chronic dilatation of the common bile duct.  No acute findings.  GI curbside.  08/01/20: Seen and examined with daughter present at her bedside.  Weak appearing with improved conversational dyspnea.  Coughing less.  She has no new complaints.   Assessment/Plan: Principal Problem:   Sepsis ([place]) Active Problems:   Hyperlipidemia   Chronic diastolic heart failure (HCC)   Cellulitis of left lower extremity   Atrial fibrillation with rapid ventricular response (HCC)   Hypotension   Lung nodule   Pressure injury of skin  Severe sepsis, secondary to left lower extremity cellulitis/strep group C bacteremia Presented with lactic acidosis, 6.7, tachycardia, fever and left lower extremity cellulitis. Initially on IV vancomycin, cefepime and Flagyl. Blood  cultures drawn on 07/25/2020 + for Streptococcus group C Lactic acidosis has resolved, afebrile Cefepime switched to Rocephin on 07/26/2020, Rocephin switched to unasyn on 07/28/20. Started on Augmentin on 07/29/2020 per ID. No DVT noted on Doppler studies.   Infectious disease consulted at patient's daughter's request. Worsening leukocytosis and neutrophilia Repeat CBC with differentials in the morning  Suspected dysphagia Per bedside RN, noted cough after drinking water Speech therapist consulted, mild aspiration risk, recommendation for regular consistency diet and thin liquid. Follow speech therapist recommendations.  Acute transaminitis, unclear etiology Possible cirrhosis on abdominal ultrasound Chronic common bile duct dilatation on ultrasound Trend LFTs, downtrending Acute hepatitis panel negative Avoid hepatotoxic agents GI following  Strep groupC bacteremia, likely from skin source, left lower extremity cellulitis. From blood culture drawn on 07/25/2020, positive for Streptococcus group C, pansensitive Repeat blood cultures peripherally x2 on 2020-07-31 Management as stated above  Permanent A. fib with RVR, RVR resolved on cardizem and lopressor, suspect related to lung physiology Currently is on oral Lopressor 50 mg twice daily Received IV Cardizem bolus and Cardizem drip, discontinued on 07/30/2020 Started on p.o. Cardizem 30 mg every 6 hours per cardiology on 2020-07-31 She is on warfarin for CVA prevention  TSH normal 2D echo done on 07/29/2020 showed normal LVEF 60 to 65% with mildly dilated left atrium. Continue to closely monitor on telemetry Rate is now controlled, repeat 12 lead EKG showing rate controlled A. Fib.  Supratherapeutic INR Goal INR between 2 and 3 INR 3.1> 2.7 Appreciate pharmacy's assistance  Resolved acute hypoxic respiratory failure secondary to pulmonary edema seen on chest x-ray, likely from volume overload BNP mildly elevated greater than 300 Not  on oxygen supplementation at baseline Continue IV diuresing as  tolerated Continue to maintain O2 saturation greater than 92% Will need home oxygen evaluation prior to DC Continue bronchodilators Xopenex nebs and Atrovent nebs 3 times daily Continue pulmonary toilet, Mucinex, hypersaline nebs, chest PT Encourage use of incentive spirometer  Resolved AKI Baseline creatinine appears to be 0.5 with GFR greater than 60 Presented with creatinine of 1.1 She is currently back to her baseline creatinine. Continue to avoid nephrotoxins Monitor urine output 2.2 L urine output recorded in the last 24 hours  Resolved constipation Continue bowel regimen. Daughter requests colace instead of sennokot  Resolved post repletion: Hypokalemia and hypomagnesemia Serum potassium 4.1, serum magnesium 2.2. Replete as indicated  Right upper lobe lung nodule Incidental finding.  Outpatient monitoring.  Hyperlipidemia Hold statin due to acute transaminitis  Recent eye surgery Continue home eyedrops  Physical debility PT recommended CIR CIR consulted for evaluation and possible placement Continue fall precautions  Stage 2 pressure injury of left buttock/posterior left knee lesion, POA Local wound care Wound care specialist consulted  Discussion with family: Spent a minimum of 25 mins talking with patient's daughter in the room, explaining lab results and imaging, and going over plan and answering questions.    DVT Prophylaxis:  Warfarin Code Status: Full code Family Communication: Discussed with the patient's daughter at bedside.  Consultant: Cardiology, ID, GI.   Disposition Plan:     Status is: Inpatient    Dispo:  Patient From: Home  Planned Disposition: Inpatient Rehab  Expected discharge date: 08/02/20  Medically stable for discharge: No, ongoing management for left lower extremity cellulitis and permanent A. fib with RVR.         Objective: Vitals:    08/01/20 0734 08/01/20 0828 08/01/20 1007 08/01/20 1116  BP: 134/68  132/83 123/68  Pulse: (!) 112  (!) 113 (!) 102  Resp:      Temp: 98.7 F (37.1 C)   (!) 97.5 F (36.4 C)  TempSrc: Oral   Oral  SpO2: 91% 94%  94%  Weight:      Height:        Intake/Output Summary (Last 24 hours) at 08/01/2020 1519 Last data filed at 08/01/2020 0920 Gross per 24 hour  Intake 480 ml  Output 1450 ml  Net -970 ml   Filed Weights   07/30/20 0635 07/31/20 0628 08/01/20 0623  Weight: 81 kg 79.6 kg 79.8 kg    Exam:  . General: 86 y.o. year-old female pleasant well-developed well-nourished no acute distress.  Alert and interactive. .   Cardiovascular: Irregular rate and rhythm no rubs or gallops. Marland Kitchen Respiratory: Mild rales at bases no wheezing noted. . Abdomen: Soft nontender normal bowel sounds present.   . Musculoskeletal: Left lower extremity wrapped in dressing.\ . Psychiatry: Mood is appropriate for condition and setting.   Data Reviewed: CBC: Recent Labs  Lab 07/28/20 0507 07/29/20 0414 07/30/20 1735 07/31/20 0717 08/01/20 0835  WBC 9.0 11.7* 15.7* 16.7* 16.1*  NEUTROABS  --   --  12.8* 15.0* 13.2*  HGB 13.4 13.9 13.1 12.0 12.3  HCT 41.6 42.6 40.6 36.8 36.8  MCV 93.7 92.6 92.3 91.3 91.5  PLT 134* 172 239 311 229   Basic Metabolic Panel: Recent Labs  Lab 07/27/20 0357 07/27/20 0357 07/28/20 0507 07/29/20 0414 07/30/20 1735 07/31/20 0717 08/01/20 0835  NA 136   < > 136 136 134* 132* 134*  K 3.6   < > 4.1 4.0 4.0 4.0 3.8  CL 104   < > 105 101 99 97* 99  CO2 22   < > 20* '25 24 27 28  ' GLUCOSE 128*   < > 116* 130* 136* 135* 141*  BUN 20   < > '19 11 14 14 12  ' CREATININE 0.74   < > 0.63 0.64 0.62 0.57 0.43*  CALCIUM 8.6*   < > 8.7* 8.8* 8.7* 8.4* 8.5*  MG 1.6*  --  2.2  --   --   --   --    < > = values in this interval not displayed.   GFR: Estimated Creatinine Clearance: 51.6 mL/min (A) (by C-G formula based on SCr of 0.43 mg/dL (L)). Liver Function Tests: Recent Labs    Lab 07/29/20 0414 07/30/20 1735 07/31/20 0717 08/01/20 0835  AST 129* 277* 181* 112*  ALT 78* 118* 95* 77*  ALKPHOS 135* 195* 177* 188*  BILITOT 1.3* 1.6* 1.5* 1.5*  PROT 5.7* 6.1* 5.7* 5.8*  ALBUMIN 1.8* 1.8* 1.6* 1.6*   Recent Labs  Lab 07/31/20 0717  LIPASE 78*   No results for input(s): AMMONIA in the last 168 hours. Coagulation Profile: Recent Labs  Lab 07/28/20 0507 07/29/20 0414 07/30/20 1735 07/31/20 0717 08/01/20 0835  INR 3.0* 2.4* 2.7* 3.1* 2.7*   Cardiac Enzymes: No results for input(s): CKTOTAL, CKMB, CKMBINDEX, TROPONINI in the last 168 hours. BNP (last 3 results) No results for input(s): PROBNP in the last 8760 hours. HbA1C: Recent Labs    07/31/20 0717  HGBA1C 6.8*   CBG: No results for input(s): GLUCAP in the last 168 hours. Lipid Profile: No results for input(s): CHOL, HDL, LDLCALC, TRIG, CHOLHDL, LDLDIRECT in the last 72 hours. Thyroid Function Tests: No results for input(s): TSH, T4TOTAL, FREET4, T3FREE, THYROIDAB in the last 72 hours. Anemia Panel: Recent Labs    08/01/20 0835  VITAMINB12 2,091*   Urine analysis:    Component Value Date/Time   COLORURINE YELLOW 07/25/2020 1758   APPEARANCEUR CLEAR 07/25/2020 1758   LABSPEC 1.018 07/25/2020 1758   PHURINE 6.0 07/25/2020 1758   GLUCOSEU NEGATIVE 07/25/2020 1758   HGBUR NEGATIVE 07/25/2020 1758   BILIRUBINUR NEGATIVE 07/25/2020 1758   KETONESUR NEGATIVE 07/25/2020 1758   PROTEINUR NEGATIVE 07/25/2020 1758   NITRITE NEGATIVE 07/25/2020 1758   LEUKOCYTESUR NEGATIVE 07/25/2020 1758   Sepsis Labs: '@LABRCNTIP' (procalcitonin:4,lacticidven:4)  ) Recent Results (from the past 240 hour(s))  Blood Culture (routine x 2)     Status: Abnormal   Collection Time: 07/25/20  3:30 PM   Specimen: BLOOD RIGHT HAND  Result Value Ref Range Status   Specimen Description BLOOD RIGHT HAND  Final   Special Requests   Final    BOTTLES DRAWN AEROBIC ONLY Blood Culture results may not be optimal due  to an inadequate volume of blood received in culture bottles   Culture  Setup Time   Final    GRAM POSITIVE COCCI IN CHAINS AEROBIC BOTTLE ONLY CRITICAL RESULT CALLED TO, READ BACK BY AND VERIFIED WITH: A. Rogers Blocker PharmD 8:30 07/26/20 ([email or web address]) Performed at [facility], [place] [address], [place], [place] [address]    Culture STREPTOCOCCUS GROUP C (A)  Final   Report Status 07/28/2020 FINAL  Final   Organism ID, Bacteria STREPTOCOCCUS GROUP C  Final      Susceptibility   Streptococcus group c - MIC*    CLINDAMYCIN <=0.25 SENSITIVE Sensitive     AMPICILLIN <=0.25 SENSITIVE Sensitive     ERYTHROMYCIN <=0.12 SENSITIVE Sensitive     VANCOMYCIN 0.25 SENSITIVE Sensitive     CEFTRIAXONE <=0.12 SENSITIVE  Sensitive     LEVOFLOXACIN 1 SENSITIVE Sensitive     * STREPTOCOCCUS GROUP C  Urine culture     Status: None   Collection Time: 07/25/20  3:30 PM   Specimen: In/Out Cath Urine  Result Value Ref Range Status   Specimen Description IN/OUT CATH URINE  Final   Special Requests NONE  Final   Culture   Final    NO GROWTH Performed at [facility], [place] [address] [place], [place] [address]    Report Status 07/26/2020 FINAL  Final  Blood Culture ID Panel (Reflexed)     Status: Abnormal   Collection Time: 07/25/20  3:30 PM  Result Value Ref Range Status   Enterococcus faecalis NOT DETECTED NOT DETECTED Final   Enterococcus Faecium NOT DETECTED NOT DETECTED Final   Listeria monocytogenes NOT DETECTED NOT DETECTED Final   Staphylococcus species NOT DETECTED NOT DETECTED Final   Staphylococcus aureus (BCID) NOT DETECTED NOT DETECTED Final   Staphylococcus epidermidis NOT DETECTED NOT DETECTED Final   Staphylococcus lugdunensis NOT DETECTED NOT DETECTED Final   Streptococcus species DETECTED (A) NOT DETECTED Final    Comment: Not Enterococcus species, Streptococcus agalactiae, Streptococcus pyogenes, or Streptococcus pneumoniae. CRITICAL RESULT CALLED TO, READ BACK BY AND VERIFIED  WITH: A. Rogers Blocker PharmD 8:30 07/26/20 ([email or web address])    Streptococcus agalactiae NOT DETECTED NOT DETECTED Final   Streptococcus pneumoniae NOT DETECTED NOT DETECTED Final   Streptococcus pyogenes NOT DETECTED NOT DETECTED Final   A.calcoaceticus-baumannii NOT DETECTED NOT DETECTED Final   Bacteroides fragilis NOT DETECTED NOT DETECTED Final   Enterobacterales NOT DETECTED NOT DETECTED Final   Enterobacter cloacae complex NOT DETECTED NOT DETECTED Final   Escherichia coli NOT DETECTED NOT DETECTED Final   Klebsiella aerogenes NOT DETECTED NOT DETECTED Final   Klebsiella oxytoca NOT DETECTED NOT DETECTED Final   Klebsiella pneumoniae NOT DETECTED NOT DETECTED Final   Proteus species NOT DETECTED NOT DETECTED Final   Salmonella species NOT DETECTED NOT DETECTED Final   Serratia marcescens NOT DETECTED NOT DETECTED Final   Haemophilus influenzae NOT DETECTED NOT DETECTED Final   Neisseria meningitidis NOT DETECTED NOT DETECTED Final   Pseudomonas aeruginosa NOT DETECTED NOT DETECTED Final   Stenotrophomonas maltophilia NOT DETECTED NOT DETECTED Final   Candida albicans NOT DETECTED NOT DETECTED Final   Candida auris NOT DETECTED NOT DETECTED Final   Candida glabrata NOT DETECTED NOT DETECTED Final   Candida krusei NOT DETECTED NOT DETECTED Final   Candida parapsilosis NOT DETECTED NOT DETECTED Final   Candida tropicalis NOT DETECTED NOT DETECTED Final   Cryptococcus neoformans/gattii NOT DETECTED NOT DETECTED Final    Comment: Performed at [facility], [address] [place], [place] [address]  SARS Coronavirus 2 by RT PCR (hospital order, performed in [facility] hospital lab) Nasopharyngeal Nasopharyngeal Swab     Status: None   Collection Time: 07/25/20  3:33 PM   Specimen: Nasopharyngeal Swab  Result Value Ref Range Status   SARS Coronavirus 2 NEGATIVE NEGATIVE Final    Comment: (NOTE) SARS-CoV-2 target nucleic acids are NOT DETECTED.  The SARS-CoV-2 RNA is generally detectable  in upper and lower respiratory specimens during the acute phase of infection. The lowest concentration of SARS-CoV-2 viral copies this assay can detect is 250 copies / mL. A negative result does not preclude SARS-CoV-2 infection and should not be used as the sole basis for treatment or other patient management decisions.  A negative result may occur with improper specimen collection /  handling, submission of specimen other than nasopharyngeal swab, presence of viral mutation(s) within the areas targeted by this assay, and inadequate number of viral copies (<250 copies / mL). A negative result must be combined with clinical observations, patient history, and epidemiological information.  Fact Sheet for Patients:   StrictlyIdeas.no  Fact Sheet for Healthcare Providers: BankingDealers.co.za  This test is not yet approved or  cleared by the [place] FDA and has been authorized for detection and/or diagnosis of SARS-CoV-2 by FDA under an Emergency Use Authorization (EUA).  This EUA will remain in effect (meaning this test can be used) for the duration of the COVID-19 declaration under Section 564(b)(1) of the Act, 21 U.S.C. section 360bbb-3(b)(1), unless the authorization is terminated or revoked sooner.  Performed at [facility], [place] [address] [place], [place] [address]   MRSA PCR Screening     Status: None   Collection Time: 07/28/20  4:32 PM   Specimen: Nasal Mucosa; Nasopharyngeal  Result Value Ref Range Status   MRSA by PCR NEGATIVE NEGATIVE Final    Comment:        The GeneXpert MRSA Assay (FDA approved for NASAL specimens only), is one component of a comprehensive MRSA colonization surveillance program. It is not intended to diagnose MRSA infection nor to guide or monitor treatment for MRSA infections. Performed at [facility], [place] [address] [place], [place] [address]   Culture, blood (routine x 2)      Status: None (Preliminary result)   Collection Time: 07/31/20  3:35 PM   Specimen: BLOOD RIGHT WRIST  Result Value Ref Range Status   Specimen Description BLOOD RIGHT WRIST  Final   Special Requests   Final    BOTTLES DRAWN AEROBIC ONLY Blood Culture results may not be optimal due to an inadequate volume of blood received in culture bottles   Culture   Final    NO GROWTH < 12 HOURS Performed at [facility], [place] [address], [place], [place] [address]    Report Status PENDING  Incomplete  Culture, blood (routine x 2)     Status: None (Preliminary result)   Collection Time: 07/31/20  3:35 PM   Specimen: BLOOD RIGHT HAND  Result Value Ref Range Status   Specimen Description BLOOD RIGHT HAND  Final   Special Requests   Final    BOTTLES DRAWN AEROBIC ONLY Blood Culture results may not be optimal due to an inadequate volume of blood received in culture bottles   Culture   Final    NO GROWTH < 12 HOURS Performed at [facility], [place] [address], [place],  [address]    Report Status PENDING  Incomplete      Studies: DG CHEST PORT 1 VIEW  Result Date: 07/31/2020 CLINICAL DATA:  Cough EXAM: PORTABLE CHEST 1 VIEW COMPARISON:  July 27, 2020 FINDINGS: The heart size and mediastinal contours are mildly enlarged. Aortic calcifications are seen. Small bilateral effusions are seen. Increased interstitial markings are seen throughout both lungs. No acute osseous abnormality. IMPRESSION: Increased interstitial markings seen throughout both lungs which may be due to atelectasis and/or infectious etiology. Small bilateral pleural effusions. Electronically Signed   By: Prudencio Pair M.D.   On: 07/31/2020 19:26    Scheduled Meds: . amoxicillin  500 mg Oral Q8H  . calcium-vitamin D  1 tablet Oral QHS  . dextromethorphan-guaiFENesin  2 tablet Oral BID  . diltiazem  30 mg Oral Q6H  . docusate  sodium  100 mg Oral BID  . feeding supplement (ENSURE ENLIVE)  237 mL Oral BID BM  .  furosemide  20 mg Intravenous BID  . ipratropium  0.5 mg Nebulization BID  . levalbuterol  0.63 mg Nebulization BID  . metoprolol tartrate  50 mg Oral BID  . mineral oil  1 enema Rectal Once  . polyethylene glycol  17 g Oral Daily  . sodium chloride HYPERTONIC  4 mL Nebulization BID  . warfarin  2.5 mg Oral ONCE-1600  . Warfarin - Pharmacist Dosing Inpatient   Does not apply q1600    Continuous Infusions:    LOS: 7 days     Kayleen Memos, MD [facility] Pager [phone]  If 7PM-7AM, please contact night-coverage [email or web address] Password [facility] 08/01/2020, 3:19 PM

## 2020-08-01 NOTE — Evaluation (Signed)
Clinical/Bedside Swallow Evaluation Patient Details  Name: [name] [name] MRN: [id number] Date of Birth: May 20, 1931  Today's Date: 08/01/2020 Time: SLP Start Time (ACUTE ONLY): 5 SLP Stop Time (ACUTE ONLY): 0940 SLP Time Calculation (min) (ACUTE ONLY): 25 min  Past Medical History:  Past Medical History:  Diagnosis Date  . A-fib ([place])    paroxysmal  . Aftercare for healing traumatic fracture of arm, unspecified   . H/O: hemorrhoidectomy   . H/O: hysterectomy   . Hyperlipidemia   . Hypertension   . Malignant melanoma of skin of trunk, except scrotum ([place])   . Other postprocedural status(V45.89)   . Rosacea   . S/P cholecystectomy   . Sciatica    right  . Sprain of lumbar region    Past Surgical History:  Past Surgical History:  Procedure Laterality Date  . ABDOMINAL HYSTERECTOMY    . BLADDER SUSPENSION     never go surgery because it got canceled due to heart rate   . cataract Bilateral 2019  . catheter ablation of atrail flutter    . CHOLECYSTECTOMY    . HERNIA REPAIR     HPI:  Pt is an 86 y.o. female with medical history significant for atrial fibrillation, chronic diastolic heart failure, hypertension, hyperlipidemia and anxiety who presented with worsening left leg pain and nausea, vomiting and diarrhea. CXR 9/4: Increased interstitial markings seen throughout both lungs which may be due to atelectasis and/or infectious etiology. Per referring MD's note, RN has noted cough after drinking water and pt has worsening leukocytosis.    Assessment / Plan / Recommendation Clinical Impression  Pt was seen for bedside swallow evaluation with her daughter present. Pt's daughter reported that the pt has GERD which causes things to "get stuck" and her to cough afterwards. Pt reported that "sometimes foods just won't go down and I can't swallow when it happens". She identified her mid chest as the site of the sensation and reiterated having coughing when she has this difficulty. Oral  mechanism exam was [facility]. Dentition was limited to mandibular incisors; pt indicated that she has stopped wearing her maxillary dentures since they hurt her palate. She exhibited delayed coughing following 1/3 boluses of regular texture solids but no other s/sx of aspiration were noted and pt's oral phase appeared functional. It is recommended that the pt's current diet of regular texture solids and thin liquids be continued. SLP will follow to assess diet tolerance and determine whether an instrumental assessment is warranted. Evaluation of the esophageal phase of the swallow may be beneficial considering the pt's symptoms.  SLP Visit Diagnosis: Dysphagia, unspecified (R13.10)    Aspiration Risk  Mild aspiration risk    Diet Recommendation Regular;Thin liquid   Liquid Administration via: Cup;Straw Medication Administration: Whole meds with liquid Supervision: Staff to assist with self feeding Compensations: Slow rate;Small sips/bites Postural Changes: Seated upright at 90 degrees;Remain upright for at least 30 minutes after po intake    Other  Recommendations Oral Care Recommendations: Oral care BID   Follow up Recommendations Other (comment) (TBD)      Frequency and Duration min 2x/week  2 weeks       Prognosis        Swallow Study   General Date of Onset: 07/31/20 HPI: Pt is an 86 y.o. female with medical history significant for atrial fibrillation, chronic diastolic heart failure, hypertension, hyperlipidemia and anxiety who presented with worsening left leg pain and nausea, vomiting and diarrhea. CXR 9/4: Increased interstitial markings seen throughout  both lungs which may be due to atelectasis and/or infectious etiology. Per referring MD's note, RN has noted cough after drinking water and pt has worsening leukocytosis.  Type of Study: Bedside Swallow Evaluation Previous Swallow Assessment: None Diet Prior to this Study: Regular;Thin liquids Temperature Spikes Noted:  No Respiratory Status: Room air History of Recent Intubation: No Behavior/Cognition: Alert;Cooperative;Pleasant mood Oral Cavity Assessment: Within Functional Limits Oral Care Completed by SLP: No Oral Cavity - Dentition: Adequate natural dentition Vision: Functional for self-feeding Self-Feeding Abilities: Able to feed self Patient Positioning: Upright in bed Baseline Vocal Quality: Normal Volitional Cough: Weak Volitional Swallow: Able to elicit    Oral/Motor/Sensory Function Overall Oral Motor/Sensory Function: Within functional limits   Ice Chips Ice chips: Within functional limits Presentation: Spoon   Thin Liquid Thin Liquid: Within functional limits Presentation: Cup;Straw    Nectar Thick Nectar Thick Liquid: Not tested   Honey Thick Honey Thick Liquid: Not tested   Puree Puree: Within functional limits Presentation: Spoon   Solid     Solid: Impaired Pharyngeal Phase Impairments: Cough - Delayed     Zain Bingman I. Hardin Negus, [place], [place] Office number [phone] Pager [phone]  Horton Marshall 08/01/2020,9:52 AM

## 2020-08-01 NOTE — Progress Notes (Signed)
Cardiology Rounding Note  Patient Name: [name] [name] Date of Encounter: 08/01/2020  Primary Cardiologist: No primary care provider on file. Electrophysiologist: Cristopher Peru, MD   Subjective   Daughter present at bedside. She reports that heart rates have been stable on current regimen. Daughter notes that 1-2 hours after metoprolol is given, she has more of a tremor in her mouth, but doesn't feel that this needs to change the use of metoprolol.  Inpatient Medications    Scheduled Meds:  amoxicillin  500 mg Oral Q8H   calcium-vitamin D  1 tablet Oral Q breakfast   dextromethorphan-guaiFENesin  2 tablet Oral BID   diltiazem  30 mg Oral Q6H   feeding supplement (ENSURE ENLIVE)  237 mL Oral BID BM   furosemide  20 mg Intravenous BID   ipratropium  0.5 mg Nebulization BID   levalbuterol  0.63 mg Nebulization BID   metoprolol tartrate  50 mg Oral BID   mineral oil  1 enema Rectal Once   polyethylene glycol  17 g Oral Daily   senna-docusate  2 tablet Oral BID   sodium chloride HYPERTONIC  4 mL Nebulization BID   warfarin  2.5 mg Oral ONCE-1600   Warfarin - Pharmacist Dosing Inpatient   Does not apply q1600   Continuous Infusions:  PRN Meds: ALPRAZolam, bisacodyl, iohexol, magic mouthwash w/lidocaine, metoprolol tartrate, ondansetron (ZOFRAN) IV, polyvinyl alcohol   Vital Signs    Vitals:   08/01/20 0623 08/01/20 0734 08/01/20 0828 08/01/20 1007  BP:  134/68  132/83  Pulse:  (!) 112  (!) 113  Resp: 18     Temp: 98.7 F (37.1 C) 98.7 F (37.1 C)    TempSrc: Oral Oral    SpO2:  91% 94%   Weight: 79.8 kg     Height:        Intake/Output Summary (Last 24 hours) at 08/01/2020 1109 Last data filed at 08/01/2020 7673 Gross per 24 hour  Intake 240 ml  Output 2100 ml  Net -1860 ml   Filed Weights   07/30/20 0635 07/31/20 0628 08/01/20 0623  Weight: 81 kg 79.6 kg 79.8 kg    Physical Exam    GEN: Well nourished, well developed in no acute  distress NECK: No JVD CARDIAC: irregularly irregular rhythm, normal S1 and S2, no rubs or gallops. No murmur. VASCULAR: Radial pulses 2+ bilaterally.  RESPIRATORY:  Clear to auscultation without rales, wheezing or rhonchi  ABDOMEN: Soft, non-tender, non-distended MUSCULOSKELETAL:  Moves all 4 limbs independently SKIN: Warm and dry, no edema NEUROLOGIC:  No focal neuro deficits noted. PSYCHIATRIC:  Normal affect   Labs    CBC Recent Labs    07/31/20 0717 08/01/20 0835  WBC 16.7* 16.1*  NEUTROABS 15.0* 13.2*  HGB 12.0 12.3  HCT 36.8 36.8  MCV 91.3 91.5  PLT 311 419   Basic Metabolic Panel Recent Labs    07/31/20 0717 08/01/20 0835  NA 132* 134*  K 4.0 3.8  CL 97* 99  CO2 27 28  GLUCOSE 135* 141*  BUN 14 12  CREATININE 0.57 0.43*  CALCIUM 8.4* 8.5*   Liver Function Tests Recent Labs    07/31/20 0717 08/01/20 0835  AST 181* 112*  ALT 95* 77*  ALKPHOS 177* 188*  BILITOT 1.5* 1.5*  PROT 5.7* 5.8*  ALBUMIN 1.6* 1.6*   Recent Labs    07/31/20 0717  LIPASE 78*   Cardiac Enzymes No results for input(s): CKTOTAL, CKMB, CKMBINDEX, TROPONINI in the last 72  hours.   Telemetry    Atrial fibrillation, rates 80s-120s, gradually improving (personally reviewed)  Radiology    US Abdomen Complete  Result Date: 07/31/2020 CLINICAL DATA:  Transaminitis. EXAM: ABDOMEN ULTRASOUND COMPLETE COMPARISON:  CT, 07/25/2020 FINDINGS: Gallbladder: Surgically absent Common bile duct: Diameter: 9 mm Liver: Coarsened liver echotexture. Subtle surface nodularity. No defined mass. Calcification noted along the inferior margin of the liver stable from the prior CT. Portal vein is patent on color Doppler imaging with normal direction of blood flow towards the liver. IVC: No abnormality visualized. Pancreas: Mostly obscured. Spleen: Size and appearance within normal limits. Right Kidney: Length: 11.5 cm. Echogenicity within normal limits. No mass or hydronephrosis visualized. Left Kidney:  Length: 11.1 cm. Echogenicity within normal limits. No mass or hydronephrosis visualized. Abdominal aorta: No aneurysm visualized. Other findings: None. IMPRESSION: 1. Coarsened liver echotexture with subtle surface nodularity. Findings support cirrhosis in the proper clinical setting. 2. No liver masses. 3. Status post cholecystectomy. Chronic dilation of the common bile duct. 4. No acute findings. Electronically Signed   By: Lajean Manes M.D.   On: 07/31/2020 11:45   DG CHEST PORT 1 VIEW  Result Date: 07/31/2020 CLINICAL DATA:  Cough EXAM: PORTABLE CHEST 1 VIEW COMPARISON:  July 27, 2020 FINDINGS: The heart size and mediastinal contours are mildly enlarged. Aortic calcifications are seen. Small bilateral effusions are seen. Increased interstitial markings are seen throughout both lungs. No acute osseous abnormality. IMPRESSION: Increased interstitial markings seen throughout both lungs which may be due to atelectasis and/or infectious etiology. Small bilateral pleural effusions. Electronically Signed   By: Prudencio Pair M.D.   On: 07/31/2020 19:26    Patient Profile     [name] [name] is a 86 y.o. female with a history of AF, HTN, chronic diastolic CHF, essential HTN, HDL, and anxiety admitted for cellulitis -> bacteremia who is being seen for the evaluation of afib with RVR at the request of Dr. Nevada Crane.  Assessment & Plan    AF with RVR HTN -heart rates remain intermittently elevated, but blood pressure stable. -daughter feels she is doing well with current regimen -will continue to monitor heart rate and blood pressure.  -may be able to scale back cardizem as her condition improved.  -will be helpful to know what heart rate does with activity  Per primary team: Strep group C bacteremia Hepatitis  Hypoalbuminemia Leukocytosis     Signed, Buford Dresser, MD  08/01/2020, 11:09 AM

## 2020-08-01 NOTE — Consult Note (Addendum)
Consultation  Referring Provider: [facility]/ Dr Nevada Crane Primary Care Physician:  Hoyt Koch, MD Primary Gastroenterologist:  none  Reason for consult; elevated LFTs  HPI: [name] [name] is a 86 y.o. female, who we are asked to evaluate for elevated LFTs and new finding of cirrhosis on imaging. Patient was admitted on 07/25/2020 with progressive left lower extremity pain , fever, and initially had nausea vomiting diarrhea at home. CTA was negative for PE. CT abdomen and pelvis showed fat stranding around the left anterior thigh tracking along the iliac vessel with prominent left inguinal nodes.  She was noted to have coarsened echotexture of the subhepatic liver on the right.  Status post gallbladder with CBD of 8 mm.  No ascites. She was felt to have sepsis SIRS secondary to lower extremity cellulitis and was started on IV antibiotics.  She has had vancomycin/Flagyl/ceftriaxone and is now on amoxicillin per ID after cultures grew pansensitive group C strep.  She has been improving gradually and plan is for eventual transition to rehab. Patient has history of atrial fibrillation, congestive heart failure, hypertension, hyperlipidemia.  She had been on Coumadin outpatient. During hospitalization has had A. fib with RVR refractory to oral meds and beta-blocker and is now on Cardizem. Reviewing her labs as of September 2020 LFTs were normal. On admission 07/26/2019 AST 51 remainder of LFTs normal INR 2.4. On 07/30/2019 1T bili 1.3/alk phos 135/AST 129/ALT 78.  Today-T bili 1.5/alk phos 188/AST 112/ALT 77 WBC 16.1, hemoglobin 12.3 platelets 393. Acute hepatitis panel negative  Patient has no complaints of abdominal pain, she had been constipated and her daughter felt her abdomen was somewhat distended but improved after large bowel movement today. No prior history of known liver disease, no history of EtOH, no family history of liver disease. She had been on Lipitor at home, currently on  hold.   Past Medical History:  Diagnosis Date  . A-fib ([place])    paroxysmal  . Aftercare for healing traumatic fracture of arm, unspecified   . H/O: hemorrhoidectomy   . H/O: hysterectomy   . Hyperlipidemia   . Hypertension   . Malignant melanoma of skin of trunk, except scrotum ([place])   . Other postprocedural status(V45.89)   . Rosacea   . S/P cholecystectomy   . Sciatica    right  . Sprain of lumbar region     Past Surgical History:  Procedure Laterality Date  . ABDOMINAL HYSTERECTOMY    . BLADDER SUSPENSION     never go surgery because it got canceled due to heart rate   . cataract Bilateral 2019  . catheter ablation of atrail flutter    . CHOLECYSTECTOMY    . HERNIA REPAIR      Prior to Admission medications   Medication Sig Start Date End Date Taking? Authorizing Provider  acetaminophen (TYLENOL) 500 MG tablet Take 1,000 mg by mouth every 6 (six) hours as needed for headache (pain). For pain    Yes [provider]  ALPRAZolam (XANAX) 0.25 MG tablet Take 1 tablet (0.25 mg total) by mouth daily as needed. for anxiety Patient taking differently: Take 0.25 mg by mouth daily as needed for anxiety.  12/02/19  Yes Hoyt Koch, MD  atorvastatin (LIPITOR) 20 MG tablet Take 1 tablet (20 mg total) by mouth daily at 6 PM. Annual appt due in Sept must see provider for future refills 02/18/20  Yes Hoyt Koch, MD  Calcium Carbonate-Vitamin D (CALCIUM-VITAMIN D) 500-200 MG-UNIT per tablet  Take 1 tablet by mouth every other day.    Yes [provider]  Carboxymethylcellulose Sodium (THERATEARS OP) Place 1 drop into both eyes 2 (two) times daily as needed (dry eyes).   Yes [provider]  ENSURE (ENSURE) Take 237 mLs by mouth daily as needed (meal supplement).    Yes [provider]  furosemide (LASIX) 40 MG tablet TAKE 1 TABLET BY MOUTH EVERY DAY Patient taking differently: Take 40 mg by mouth at bedtime. 9pm 06/25/20  Yes Hoyt Koch, MD  Hydrocortisone (GERHARDT'S BUTT CREAM) CREA Apply 1 application topically daily as needed for irritation.    Yes [provider]  metoprolol succinate (TOPROL-XL) 100 MG 24 hr tablet TAKE 1 DAILY, WITH OR IMMEDIATELY FOLLOWING A MEAL. KEEP UPCOMING APPT WITH DR. Lovena Le IN NOVEMBER Patient taking differently: Take 100 mg by mouth daily.  12/01/19  Yes Evans Lance, MD  Multiple Vitamins-Minerals (MULTIVITAMIN WITH MINERALS) tablet Take 1 tablet by mouth daily at 6 PM.    Yes [provider]  Multiple Vitamins-Minerals (PRESERVISION AREDS 2) CAPS Take 1 capsule by mouth in the morning and at bedtime.   Yes [provider]  warfarin (COUMADIN) 2.5 MG tablet TAKE 1 TABLET DAILY EXCEPT 2 TABS ON MON AND FRI OR AS DIRECTED BY ANTICOAGULATION CLINIC Patient taking differently: Take 2.5-5 mg by mouth See admin instructions. Take 2 tablets (5 mg) by mouth on Monday mornings, take 1 tablet (2.5 mg) on all other mornings of the week - or as directed by anticoagulation clinic 05/04/20  Yes Hoyt Koch, MD  Zinc Oxide (TRIPLE PASTE) 12.8 % ointment Apply 1 application topically as needed for irritation.   Yes [provider]  hydrochlorothiazide (HYDRODIURIL) 25 MG tablet Take 1 tablet (25 mg total) by mouth daily. APPOINTMENT NEEDED FOR ADDITIONAL REFILLS 07/29/20   Hoyt Koch, MD    Current Facility-Administered Medications  Medication Dose Route Frequency Provider Last Rate Last Admin  . ALPRAZolam Duanne Moron) tablet 0.25 mg  0.25 mg Oral Daily PRN Tu, Ching T, DO   0.25 mg at 07/30/20 1855  . amoxicillin (AMOXIL) capsule 500 mg  500 mg Oral Q8H Thayer Headings, MD   500 mg at 08/01/20 0653  . bisacodyl (DULCOLAX) suppository 10 mg  10 mg Rectal Daily PRN Irene Pap N, DO   10 mg at 07/31/20 0002  . calcium-vitamin D (OSCAL WITH D) 500-200 MG-UNIT per tablet 1 tablet  1 tablet Oral QHS Hall, Carole N, DO      . dextromethorphan-guaiFENesin  (MUCINEX DM) 30-600 MG per 12 hr tablet 2 tablet  2 tablet Oral BID Irene Pap N, DO   2 tablet at 08/01/20 1005  . diltiazem (CARDIZEM) tablet 30 mg  30 mg Oral Q6H Shirley Friar, PA-C   30 mg at 08/01/20 1159  . docusate sodium (COLACE) capsule 100 mg  100 mg Oral BID Irene Pap N, DO   100 mg at 08/01/20 1159  . feeding supplement (ENSURE ENLIVE) (ENSURE ENLIVE) liquid 237 mL  237 mL Oral BID BM Hall, Carole N, DO   237 mL at 07/31/20 1747  . furosemide (LASIX) injection 20 mg  20 mg Intravenous BID Irene Pap N, DO   20 mg at 08/01/20 1010  . iohexol (OMNIPAQUE) 350 MG/ML injection 80 mL  80 mL Intravenous Once PRN Hayden Rasmussen, MD      . ipratropium (ATROVENT) nebulizer solution 0.5 mg  0.5 mg Nebulization BID  Irene Pap N, DO   0.5 mg at 08/01/20 3212  . levalbuterol (XOPENEX) nebulizer solution 0.63 mg  0.63 mg Nebulization BID Irene Pap N, DO   0.63 mg at 08/01/20 2482  . magic mouthwash w/lidocaine  2 mL Oral TID PRN Irene Pap N, DO   2 mL at 07/30/20 1017  . metoprolol tartrate (LOPRESSOR) injection 2.5 mg  2.5 mg Intravenous Q3H PRN Irene Pap N, DO   2.5 mg at 07/29/20 0801  . metoprolol tartrate (LOPRESSOR) tablet 50 mg  50 mg Oral BID Irene Pap N, DO   50 mg at 08/01/20 1007  . mineral oil enema 1 enema  1 enema Rectal Once Irene Pap N, DO      . ondansetron [facility]) injection 4 mg  4 mg Intravenous Q6H PRN Bonnielee Haff, MD   4 mg at 07/26/20 5003  . polyethylene glycol (MIRALAX / GLYCOLAX) packet 17 g  17 g Oral Daily [place], Carole N, DO   17 g at 07/31/20 1145  . polyvinyl alcohol (LIQUIFILM TEARS) 1.4 % ophthalmic solution 1 drop  1 drop Both Eyes BID PRN Benetta Spar D, RPH      . sodium chloride HYPERTONIC 3 % nebulizer solution 4 mL  4 mL Nebulization BID Irene Pap N, DO   4 mL at 08/01/20 0826  . warfarin (COUMADIN) tablet 2.5 mg  2.5 mg Oral ONCE-1600 Hall, Carole N, DO      . Warfarin - Pharmacist Dosing Inpatient   Does not apply q1600  Duanne Limerick, [facility]   Given at 07/29/20 1716    Allergies as of 07/25/2020 - Review Complete 07/25/2020  Allergen Reaction Noted  . Antihistamines, diphenhydramine-type Other (See Comments) 02/10/2015  . Oxycodone-acetaminophen Nausea Only 04/30/2009    Family History  Problem Relation Age of Onset  . Heart failure Mother   . Heart failure Father   . Liver cancer Daughter     Social History   Socioeconomic History  . Marital status: Widowed    Spouse name: Not on file  . Number of children: 6  . Years of education: 81  . Highest education level: Not on file  Occupational History  . Occupation: IT trainer: RETIRED    Comment: retired  Tobacco Use  . Smoking status: Never Smoker  . Smokeless tobacco: Never Used  Substance and Sexual Activity  . Alcohol use: No  . Drug use: No  . Sexual activity: Never  Other Topics Concern  . Not on file  Social History Narrative   10th grade. Married '51.4 sons- '52, '54, '58, '70; 2 daughters- '56, '64 older girl died of ovarian cancer.11 grandchildren, 2 great grands. Retired Primary school teacher. ACP - wishes full code status: CPR, mechanical ventilator and all heroic measures.    Social Determinants of Health   Financial Resource Strain:   . Difficulty of Paying Living Expenses: Not on file  Food Insecurity:   . Worried About Charity fundraiser in the Last Year: Not on file  . Ran Out of Food in the Last Year: Not on file  Transportation Needs:   . Lack of Transportation (Medical): Not on file  . Lack of Transportation (Non-Medical): Not on file  Physical Activity:   . Days of Exercise per Week: Not on file  . Minutes of Exercise per Session: Not on file  Stress:   . Feeling of Stress : Not on file  Social Connections:   . Frequency of  Communication with Friends and Family: Not on file  . Frequency of Social Gatherings with Friends and Family: Not on file  . Attends Religious Services: Not on file  .  Active Member of Clubs or Organizations: Not on file  . Attends Archivist Meetings: Not on file  . Marital Status: Not on file  Intimate Partner Violence:   . Fear of Current or Ex-Partner: Not on file  . Emotionally Abused: Not on file  . Physically Abused: Not on file  . Sexually Abused: Not on file    Review of Systems: Pertinent positive and negative review of systems were noted in the above HPI section.  All other review of systems was otherwise negative.Marland Kitchen  Physical Exam: Vital signs in last 24 hours: Temp:  [97.5 F (36.4 C)-98.7 F (37.1 C)] 97.5 F (36.4 C) (09/05 1116) Pulse Rate:  [90-125] 102 (09/05 1116) Resp:  [18-19] 18 (09/05 0623) BP: (122-142)/(63-83) 123/68 (09/05 1116) SpO2:  [91 %-97 %] 94 % (09/05 1116) Weight:  [79.8 kg] 79.8 kg (09/05 0623) Last BM Date: 07/31/20 General:   Alert,  Well-developed, well-nourished, pleasant and cooperative in NAD Head:  Normocephalic and atraumatic. Eyes:  Sclera clear, no icterus.   Conjunctiva pink. Ears:  Normal auditory acuity. Nose:  No deformity, discharge,  or lesions. Mouth:  No deformity or lesions.   Neck:  Supple; no masses or thyromegaly. Lungs:  Clear throughout to auscultation.   No wheezes, crackles, or rhonchi.  Heart:  Regular rate and rhythm; no murmurs, clicks, rubs,  or gallops. Abdomen:  Soft,nontender, BS active,nonpalp mass or hsm.   Rectal:  Deferred  Msk:  Symmetrical without gross deformities. . Pulses:  Normal pulses noted. Extremities:  Without clubbing or edema. Neurologic:  Alert and  oriented x4;  grossly normal neurologically. Skin:  Intact without significant lesions or rashes.. Psych:  Alert and cooperative. Normal mood and affect.  Intake/Output from previous day: 09/04 0701 - 09/05 0700 In: 240 [P.O.:240] Out: 2100 [Urine:2100] Intake/Output this shift: No intake/output data recorded.  Lab Results: Recent Labs    07/30/20 1735 07/31/20 0717 08/01/20 0835  WBC  15.7* 16.7* 16.1*  HGB 13.1 12.0 12.3  HCT 40.6 36.8 36.8  PLT 239 311 393   BMET Recent Labs    07/30/20 1735 07/31/20 0717 08/01/20 0835  NA 134* 132* 134*  K 4.0 4.0 3.8  CL 99 97* 99  CO2 _0 GLUCOSE 136* 135* 141*  BUN _1 CREATININE 0.62 0.57 0.43*  CALCIUM 8.7* 8.4* 8.5*   LFT Recent Labs    08/01/20 0835  PROT 5.8*  ALBUMIN 1.6*  AST 112*  ALT 77*  ALKPHOS 188*  BILITOT 1.5*   PT/INR Recent Labs    07/31/20 0717 08/01/20 0835  LABPROT 30.7* 27.4*  INR 3.1* 2.7*   Hepatitis Panel Recent Labs    07/31/20 1535  HEPBSAG NON REACTIVE  HCVAB NON REACTIVE  HEPAIGM NON REACTIVE  HEPBIGM NON REACTIVE     IMPRESSION:  #41 86 year old white female admitted with left lower extremity cellulitis/sepsis secondary to pansensitive group C strep.  Patient has had several antibiotics since admission, now narrowed to amoxicillin.  #2 history of chronic A. fib-A. fib with RVR refractory since admission now on Cardizem drip #3  congestive heart failure #4  Status post cholecystectomy #5  New finding of cirrhosis by imaging suggested on imaging studies, no ascites.  Normal platelet count, suggestive of compensated cirrhosis. Etiology not  clear, suspect nonalcoholic fatty liver disease. We will need to rule out autoimmune and hereditary etiologies.  Unable to calculate MELD as patient on Coumadin  #6 new LFT elevation since admission-improving over the past 48 hours. This may be reactive secondary to cellulitis/sepsis and/or to medications i.e. antibiotics.  Recommendations:  Continue to trend LFTs Continue to hold Lipitor for now until LFTs near normal We will check autoimmune and hereditary liver markers Lipid panel per patient family request Thank you, will follow along.   Amy Esterwood, PA-C  08/01/2020, 1:07 PM     Attending Physician Note   I have taken a history, examined the patient and reviewed the chart. I agree with the Advanced  Practitioner's note, impression and recommendations.  New LFT elevations since admission which are improving over past 2 days. Suspect this is a reactive change or a medication side effect. Send standard hepatic serologies and trend LFTs. Hold Lipitor.   Suspected early cirrhosis per imaging. Send standard hepatic serologies as above.   Lucio Edward, MD [facility]astroenter[facility]

## 2020-08-01 NOTE — Progress Notes (Signed)
[place] for Warfarin  Indication: atrial fibrillation  Allergies  Allergen Reactions  . Antihistamines, Diphenhydramine-Type Other (See Comments)    ALL ANTIHISTAMINES causes her heart to race  . Oxycodone-Acetaminophen Nausea Only    Patient Measurements: Height: 5\' 4"  (162.6 cm) Weight: 79.8 kg (175 lb 14.8 oz) IBW/kg (Calculated) : 54.7 Heparin Dosing Weight:   Vital Signs: Temp: 98.7 F (37.1 C) (09/05 0734) Temp Source: Oral (09/05 0734) BP: 132/83 (09/05 1007) Pulse Rate: 113 (09/05 1007)  Labs: Recent Labs    07/30/20 1735 07/30/20 1735 07/31/20 0717 08/01/20 0835  HGB 13.1   < > 12.0 12.3  HCT 40.6  --  36.8 36.8  PLT 239  --  311 393  LABPROT 27.9*  --  30.7* 27.4*  INR 2.7*  --  3.1* 2.7*  CREATININE 0.62  --  0.57 0.43*   < > = values in this interval not displayed.    Estimated Creatinine Clearance: 51.6 mL/min (A) (by C-G formula based on SCr of 0.43 mg/dL (L)).   Medical History: Past Medical History:  Diagnosis Date  . A-fib ([place])    paroxysmal  . Aftercare for healing traumatic fracture of arm, unspecified   . H/O: hemorrhoidectomy   . H/O: hysterectomy   . Hyperlipidemia   . Hypertension   . Malignant melanoma of skin of trunk, except scrotum ([place])   . Other postprocedural status(V45.89)   . Rosacea   . S/P cholecystectomy   . Sciatica    right  . Sprain of lumbar region     Medications:  Scheduled:  . amoxicillin  500 mg Oral Q8H  . calcium-vitamin D  1 tablet Oral Q breakfast  . dextromethorphan-guaiFENesin  2 tablet Oral BID  . diltiazem  30 mg Oral Q6H  . feeding supplement (ENSURE ENLIVE)  237 mL Oral BID BM  . furosemide  20 mg Intravenous BID  . ipratropium  0.5 mg Nebulization BID  . levalbuterol  0.63 mg Nebulization BID  . metoprolol tartrate  50 mg Oral BID  . mineral oil  1 enema Rectal Once  . polyethylene glycol  17 g Oral Daily  . senna-docusate  2 tablet Oral BID  .  sodium chloride HYPERTONIC  4 mL Nebulization BID  . Warfarin - Pharmacist Dosing Inpatient   Does not apply q1600    Assessment: Patient is a 50 yof that presents to the ED with N/V/D. The patient was found to be septic and started on broad spectrum antibiotics. The patient takes warfarin PTA for afib. Pharmacy has been asked to continue dosing warfarin while inpatient.   Warfarin PTA dose: 5mg  on Monday and 2.5 ROW. Last dose PTA was 8/29 2.5 mg.  Warfarin was held on 8/30 and 8/31 due to supratherapeutic INRs from infection, resumed dosing on 9/1.  INR today is therapeutic. CBC stable, no s/sx bleeding documented. WBC elevated but stable, afeb, infection clinically improving.   Goal of Therapy:  INR 2-3 Monitor platelets by anticoagulation protocol: Yes   Plan:  Warfarin 2.5 mg PO x1 tonight Daily protime  Fara Olden, PharmD PGY-1 Pharmacy Resident 08/01/2020 10:08 AM Please see AMION for all pharmacy numbers

## 2020-08-02 ENCOUNTER — Inpatient Hospital Stay (HOSPITAL_COMMUNITY): Payer: Medicare Other

## 2020-08-02 DIAGNOSIS — R935 Abnormal findings on diagnostic imaging of other abdominal regions, including retroperitoneum: Secondary | ICD-10-CM

## 2020-08-02 DIAGNOSIS — R7989 Other specified abnormal findings of blood chemistry: Secondary | ICD-10-CM

## 2020-08-02 LAB — COMPREHENSIVE METABOLIC PANEL
ALT: 66 U/L — ABNORMAL HIGH (ref 0–44)
AST: 86 U/L — ABNORMAL HIGH (ref 15–41)
Albumin: 1.5 g/dL — ABNORMAL LOW (ref 3.5–5.0)
Alkaline Phosphatase: 195 U/L — ABNORMAL HIGH (ref 38–126)
Anion gap: 9 (ref 5–15)
BUN: 13 mg/dL (ref 8–23)
CO2: 25 mmol/L (ref 22–32)
Calcium: 8.6 mg/dL — ABNORMAL LOW (ref 8.9–10.3)
Chloride: 98 mmol/L (ref 98–111)
Creatinine, Ser: 0.49 mg/dL (ref 0.44–1.00)
GFR calc Af Amer: >60 mL/min (ref >60)
GFR calc non Af Amer: >60 mL/min (ref >60)
Glucose, Bld: 162 mg/dL — ABNORMAL HIGH (ref 70–99)
Potassium: 4.1 mmol/L (ref 3.5–5.1)
Sodium: 132 mmol/L — ABNORMAL LOW (ref 135–145)
Total Bilirubin: 0.8 mg/dL (ref 0.3–1.2)
Total Protein: 5.8 g/dL — ABNORMAL LOW (ref 6.5–8.1)

## 2020-08-02 LAB — PROTIME-INR
INR: 2.6 — ABNORMAL HIGH (ref 0.8–1.2)
Prothrombin Time: 27 seconds — ABNORMAL HIGH (ref 11.4–15.2)

## 2020-08-02 LAB — LIPID PANEL
Cholesterol: 97 mg/dL (ref 0–200)
HDL: 20 mg/dL — ABNORMAL LOW (ref >40)
LDL Cholesterol: 59 mg/dL (ref 0–99)
Total CHOL/HDL Ratio: 4.9 RATIO
Triglycerides: 90 mg/dL (ref <150)
VLDL: 18 mg/dL (ref 0–40)

## 2020-08-02 LAB — CBC WITH DIFFERENTIAL/PLATELET
Abs Immature Granulocytes: 0.72 10*3/uL — ABNORMAL HIGH (ref 0.00–0.07)
Basophils Absolute: 0.1 10*3/uL (ref 0.0–0.1)
Basophils Relative: 1 %
Eosinophils Absolute: 0.1 10*3/uL (ref 0.0–0.5)
Eosinophils Relative: 1 %
HCT: 36.6 % (ref 36.0–46.0)
Hemoglobin: 12 g/dL (ref 12.0–15.0)
Immature Granulocytes: 5 %
Lymphocytes Relative: 8 %
Lymphs Abs: 1.1 10*3/uL (ref 0.7–4.0)
MCH: 29.6 pg (ref 26.0–34.0)
MCHC: 32.8 g/dL (ref 30.0–36.0)
MCV: 90.4 fL (ref 80.0–100.0)
Monocytes Absolute: 0.5 10*3/uL (ref 0.1–1.0)
Monocytes Relative: 4 %
Neutro Abs: 11.8 10*3/uL — ABNORMAL HIGH (ref 1.7–7.7)
Neutrophils Relative %: 81 %
Platelets: 429 10*3/uL — ABNORMAL HIGH (ref 150–400)
RBC: 4.05 MIL/uL (ref 3.87–5.11)
RDW: 14.2 % (ref 11.5–15.5)
WBC: 14.3 10*3/uL — ABNORMAL HIGH (ref 4.0–10.5)
nRBC: 0 % (ref 0.0–0.2)

## 2020-08-02 LAB — FERRITIN: Ferritin: 474 ng/mL — ABNORMAL HIGH (ref 11–307)

## 2020-08-02 LAB — PROCALCITONIN: Procalcitonin: 0.61 ng/mL

## 2020-08-02 MED ORDER — SACCHAROMYCES BOULARDII 250 MG PO CAPS
250.0000 mg | ORAL_CAPSULE | Freq: Two times a day (BID) | ORAL | Status: DC
  Administered 2020-08-02 – 2020-08-12 (×20): 250 mg via ORAL
  Filled 2020-08-02 (×20): qty 1

## 2020-08-02 MED ORDER — DILTIAZEM HCL ER COATED BEADS 180 MG PO CP24
180.0000 mg | ORAL_CAPSULE | Freq: Every day | ORAL | Status: DC
  Administered 2020-08-02: 180 mg via ORAL
  Filled 2020-08-02: qty 1

## 2020-08-02 MED ORDER — WARFARIN SODIUM 2.5 MG PO TABS
2.5000 mg | ORAL_TABLET | Freq: Once | ORAL | Status: AC
  Administered 2020-08-02: 2.5 mg via ORAL
  Filled 2020-08-02: qty 1

## 2020-08-02 NOTE — Consult Note (Signed)
WOC Nurse Consult Note: Patient in room [phone] Reason for Consult: LLE behind the knee blisters. Wound type: Infectious per note 08/01/20 @ 3:19pm from Dr. Nevada Crane "Strep groupC bacteremia, likely from skin source, left lower extremity cellulitis." Pressure Injury POA: NA Dressing procedure/placement/frequency: Put Xeroform gauze Kellie Simmering # [phone]). Apply as many as necessary once daily and wrap with kerlex.   Monitor the wound area(s) for worsening of condition such as: Signs/symptoms of infection,  Increase in size,  Development of or worsening of odor, Development of pain, or increased pain at the affected locations.  Notify the medical team if any of these develop.  Thank you for the consult.  Discussed plan of care with the patient and bedside nurse.  [facility] nurse will not follow at this time.  Please re-consult the [place] team if needed.  Cathlean Marseilles Tamala Julian, MSN, RN, CMSRN, [place], [facility] [place] Wound Treatment Associate Wound, Ostomy, Continence Nurse Office [phone]

## 2020-08-02 NOTE — Progress Notes (Signed)
Progress Note  Patient Name: [name] [name] [facility] Date of Encounter: 08/02/2020  Primary Cardiologist: No primary care provider on file.   Subjective   C/o left foot pain  Inpatient Medications    Scheduled Meds: . amoxicillin  500 mg Oral Q8H  . calcium-vitamin D  1 tablet Oral QHS  . dextromethorphan-guaiFENesin  2 tablet Oral BID  . diltiazem  180 mg Oral Daily  . docusate sodium  100 mg Oral BID  . feeding supplement (ENSURE ENLIVE)  237 mL Oral BID BM  . furosemide  20 mg Intravenous BID  . ipratropium  0.5 mg Nebulization BID  . levalbuterol  0.63 mg Nebulization BID  . metoprolol tartrate  50 mg Oral BID  . mineral oil  1 enema Rectal Once  . polyethylene glycol  17 g Oral Daily  . sodium chloride HYPERTONIC  4 mL Nebulization BID  . Warfarin - Pharmacist Dosing Inpatient   Does not apply q1600   Continuous Infusions:  PRN Meds: acetaminophen, ALPRAZolam, bisacodyl, iohexol, magic mouthwash w/lidocaine, metoprolol tartrate, ondansetron (ZOFRAN) IV, polyvinyl alcohol   Vital Signs    Vitals:   08/02/20 0103 08/02/20 0512 08/02/20 0558 08/02/20 0756  BP: 123/68 (!) 145/75  (!) 133/92  Pulse: 87 96  (!) 115  Resp: (!) 30     Temp: 98 F (36.7 C) 97.9 F (36.6 C)  98.4 F (36.9 C)  TempSrc:  Oral  Oral  SpO2: 93% 100%  93%  Weight:   76.4 kg   Height:        Intake/Output Summary (Last 24 hours) at 08/02/2020 0759 Last data filed at 08/02/2020 0559 Gross per 24 hour  Intake 720 ml  Output 2775 ml  Net -2055 ml   Filed Weights   07/31/20 0628 08/01/20 0623 08/02/20 0558  Weight: 79.6 kg 79.8 kg 76.4 kg    Telemetry    Atrial fib with a CVR/RVR - Personally Reviewed  ECG    none - Personally Reviewed  Physical Exam   GEN: elderly, chronically ill appearing, no acute distress.   Neck: 6 cm JVD Cardiac: IRIR, no murmurs, rubs, or gallops.  Respiratory: Clear to auscultation bilaterally. GI: Soft, nontender, non-distended  MS: left leg is banadaged  and red; No deformity. Neuro:  Nonfocal  Psych: Normal affect   Labs    Chemistry Recent Labs  Lab 07/31/20 0717 08/01/20 0835 08/02/20 0331  NA 132* 134* 132*  K 4.0 3.8 4.1  CL 97* 99 98  CO2 27 28 25   GLUCOSE 135* 141* 162*  BUN 14 12 13   CREATININE 0.57 0.43* 0.49  CALCIUM 8.4* 8.5* 8.6*  PROT 5.7* 5.8* 5.8*  ALBUMIN 1.6* 1.6* 1.5*  AST 181* 112* 86*  ALT 95* 77* 66*  ALKPHOS 177* 188* 195*  BILITOT 1.5* 1.5* 0.8  GFRNONAA >60 >60 >60  GFRAA >60 >60 >60  ANIONGAP 8 7 9      Hematology Recent Labs  Lab 07/31/20 0717 08/01/20 0835 08/02/20 0331  WBC 16.7* 16.1* 14.3*  RBC 4.03 4.02 4.05  HGB 12.0 12.3 12.0  HCT 36.8 36.8 36.6  MCV 91.3 91.5 90.4  MCH 29.8 30.6 29.6  MCHC 32.6 33.4 32.8  RDW 14.0 14.1 14.2  PLT 311 393 429*    Cardiac EnzymesNo results for input(s): TROPONINI in the last 168 hours. No results for input(s): TROPIPOC in the last 168 hours.   BNP Recent Labs  Lab 07/28/20 1915  BNP 392.6*  DDimer No results for input(s): DDIMER in the last 168 hours.   Radiology    US Abdomen Complete  Result Date: 07/31/2020 CLINICAL DATA:  Transaminitis. EXAM: ABDOMEN ULTRASOUND COMPLETE COMPARISON:  CT, 07/25/2020 FINDINGS: Gallbladder: Surgically absent Common bile duct: Diameter: 9 mm Liver: Coarsened liver echotexture. Subtle surface nodularity. No defined mass. Calcification noted along the inferior margin of the liver stable from the prior CT. Portal vein is patent on color Doppler imaging with normal direction of blood flow towards the liver. IVC: No abnormality visualized. Pancreas: Mostly obscured. Spleen: Size and appearance within normal limits. Right Kidney: Length: 11.5 cm. Echogenicity within normal limits. No mass or hydronephrosis visualized. Left Kidney: Length: 11.1 cm. Echogenicity within normal limits. No mass or hydronephrosis visualized. Abdominal aorta: No aneurysm visualized. Other findings: None. IMPRESSION: 1. Coarsened liver  echotexture with subtle surface nodularity. Findings support cirrhosis in the proper clinical setting. 2. No liver masses. 3. Status post cholecystectomy. Chronic dilation of the common bile duct. 4. No acute findings. Electronically Signed   By: Lajean Manes M.D.   On: 07/31/2020 11:45   DG CHEST PORT 1 VIEW  Result Date: 07/31/2020 CLINICAL DATA:  Cough EXAM: PORTABLE CHEST 1 VIEW COMPARISON:  July 27, 2020 FINDINGS: The heart size and mediastinal contours are mildly enlarged. Aortic calcifications are seen. Small bilateral effusions are seen. Increased interstitial markings are seen throughout both lungs. No acute osseous abnormality. IMPRESSION: Increased interstitial markings seen throughout both lungs which may be due to atelectasis and/or infectious etiology. Small bilateral pleural effusions. Electronically Signed   By: Prudencio Pair M.D.   On: 07/31/2020 19:26    Cardiac Studies   none  Patient Profile     86 y.o. female admitted with left lower leg cellulitis/sepsis/uncontrolled atrial fib  Assessment & Plan    1. Uncontrolled atrial fib - her rates are in the low 100's today. I have switched her to Cardizem 180/d in conjunction with metoprolol. 2. Acute on chronic diastolic heart failure -this is stable. She appears mostly euvolemic 3. Cellulitis - note despite anti-biotics her white count is elevated though temp down. She may need some wound care.   [name] [name]     For questions or updates, please contact [facility] Please consult [email or web address] for contact info under Cardiology/STEMI.      Signed, Cristopher Peru, MD  08/02/2020, 7:59 AM  Patient ID: [name] [name], female   DOB: [birth year], 86 y.o.   MRN: [id number]

## 2020-08-02 NOTE — Progress Notes (Addendum)
Patient ID: [name] [name] [facility], female   DOB: 11-Jun-1931, 86 y.o.   MRN: [id number]    Progress Note   Subjective   Day # 8  CC: elevated LFTs, and cirrhosis noted on imaging  Labs today T bili/0.8/alk phos 195/AST 86/ALT 66 Autoimmune markers and hereditary markers all pending Cholesterol 97/triglycerides 90/HDL 20 Ferritin 474  Patient feeling okay, was able to get transferred to an air bed today. Having increased pain in her foot and toe.     Objective   Vital signs in last 24 hours: Temp:  [97.4 F (36.3 C)-98.9 F (37.2 C)] 98.9 F (37.2 C) (09/06 1202) Pulse Rate:  [81-129] 81 (09/06 1202) Resp:  [18-34] 26 (09/06 1202) BP: (114-147)/(60-92) 114/64 (09/06 1202) SpO2:  [92 %-100 %] 94 % (09/06 1202) Weight:  [76.4 kg] 76.4 kg (09/06 0558) Last BM Date: 08/01/20 General: Elderly white in NAD Heart:  Regular rate and rhythm; no murmurs Lungs: Respirations even and unlabored, lungs CTA bilaterally Abdomen:  Soft, nontender and nondistended. Normal bowel sounds. Extremities: Left lower extremity cellulitis, leg is dressed, area of erythema with eschar on the dorsum of the foot Neurologic:  Alert and oriented,  grossly normal neurologically. Psych:  Cooperative. Normal mood and affect.  Intake/Output from previous day: 09/05 0701 - 09/06 0700 In: 720 [P.O.:720] Out: 2775 [Urine:2775] Intake/Output this shift: No intake/output data recorded.  Lab Results: Recent Labs    07/31/20 0717 08/01/20 0835 08/02/20 0331  WBC 16.7* 16.1* 14.3*  HGB 12.0 12.3 12.0  HCT 36.8 36.8 36.6  PLT 311 393 429*   BMET Recent Labs    07/31/20 0717 08/01/20 0835 08/02/20 0331  NA 132* 134* 132*  K 4.0 3.8 4.1  CL 97* 99 98  CO2 '27 28 25  ' GLUCOSE 135* 141* 162*  BUN '14 12 13  ' CREATININE 0.57 0.43* 0.49  CALCIUM 8.4* 8.5* 8.6*   LFT Recent Labs    08/02/20 0331  PROT 5.8*  ALBUMIN 1.5*  AST 86*  ALT 66*  ALKPHOS 195*  BILITOT 0.8   PT/INR Recent Labs     08/01/20 0835 08/02/20 0331  LABPROT 27.4* 27.0*  INR 2.7* 2.6*    Studies/Results: DG CHEST PORT 1 VIEW  Result Date: 07/31/2020 CLINICAL DATA:  Cough EXAM: PORTABLE CHEST 1 VIEW COMPARISON:  July 27, 2020 FINDINGS: The heart size and mediastinal contours are mildly enlarged. Aortic calcifications are seen. Small bilateral effusions are seen. Increased interstitial markings are seen throughout both lungs. No acute osseous abnormality. IMPRESSION: Increased interstitial markings seen throughout both lungs which may be due to atelectasis and/or infectious etiology. Small bilateral pleural effusions. Electronically Signed   By: Prudencio Pair M.D.   On: 07/31/2020 19:26     Assessment / Plan:    #18 86 year old female with left lower extremity extensive cellulitis/sepsis, pansensitive group C strep #2 elevated LFTs-new since admission and now trending down #3 new finding of cirrhosis by imaging, appears compensated, normal platelets #4 atrial fib with RVR-currently stable converting to oral meds #5 congestive heart failure  Plan:  Do not need to follow LFTs daily, would repeat in 3 to 4 days, and if continuing to trend down then repeat in 2 weeks. We will follow up on autoimmune and chronic hepatic markers-ferritin elevated expected with infection/recent sepsis. Check AFP GI will sign off, available for questions.  Happy to see back in the office when she completes rehab stay.    LOS: 8 days   Amy Esterwood PA-C  08/02/2020, 12:36 PM     Attending Physician Note   I have taken an interval history, reviewed the chart and examined the patient. I agree with the Advanced Practitioner's note, impression and recommendations.   LFTs rapidly trending toward normal. LFT elevation was likely reactive or medication related. Can monitor LFTs every 3-4 days. Follow up on hepatic serologies. GI office follow up after she completes rehab stay. GI signing off.   Lucio Edward, MD [facility]  Gastroenterology

## 2020-08-02 NOTE — Progress Notes (Signed)
Patient's daughter requested the need for air bed for patients leg. Dr. [place] Crane ordered it and we changed beds. Patient safely put in new air bed

## 2020-08-02 NOTE — Progress Notes (Signed)
Inpatient Rehab Admissions Coordinator:   I met with pt. And daughter at bedside to notify them that I do not have a CIR bed for this patient today. Clarified that Pt.'s Medicare is her primary insurance. There is an Probation officer listed on [employer] and pt.'s daughter states that that policy is her home insurance policy and that it covered Pt.'s medical expenses r/t a fall at the home during a previous admission. She states that that policy should not be primary during this admission as it is unrelated to previous fall. I will follow for potential admission later this week, pending medical readiness and bed availability.   Clemens Catholic, [place], [place] Admissions Coordinator  [phone] ([place]) [phone] (office)

## 2020-08-02 NOTE — Progress Notes (Signed)
PROGRESS NOTE  [name] [name] [facility] EAV:[id number] DOB: September 11, 1931 DOA: 07/25/2020 PCP: Hoyt Koch, MD  HPI/Recap of past 35 hours: 86 year old Caucasian female with a past medical history of permanent atrial fibrillation on warfarin, chronic diastolic CHF, essential hypertension, hyperlipidemia and chronic anxiety who comes in with left leg pain nausea vomiting and diarrhea.  Met SIRS criteria and noted to have left lower extremity cellulitis.  Started on empiric IV antibiotics.  TRH asked to admit for management of sepsis secondary to left lower extremity cellulitis.   Hospital course complicated by A. fib with RVR refractory to oral and IV beta-blocker.  Received a bolus of Cardizem infusion and was started on Cardizem drip.  Cardiology consulted on 07/29/2020 to assist with the management.  Heart rate improved with addition of Cardizem drip, switched to p.o. Cardizem on 2020-07-31. Infectious disease has been consulted at patient's daughter request and following.  Hospital course also complicated by acute transaminitis for which a complete abdomen ultrasound was completed on 2020-07-31, revealing suspicious for cirrhosis, no liver masses, chronic dilatation of the common bile duct.  No acute findings.  GI curbside.  08/02/20: Seen and examined with daughter present at her bedside. Significant left foot pain.  Will obtain imaging to further assess.  Assessment/Plan: Principal Problem:   Sepsis ([place]) Active Problems:   Hyperlipidemia   Chronic diastolic heart failure (HCC)   Cellulitis of left lower extremity   Atrial fibrillation with rapid ventricular response (HCC)   Hypotension   Lung nodule   Pressure injury of skin   Elevated LFTs   Abnormal [place] (ultrasound) of abdomen  Severe sepsis, improving, secondary to left lower extremity cellulitis/strep group C bacteremia Presented with lactic acidosis, 6.7, tachycardia, fever and left lower extremity cellulitis. Initially on IV  vancomycin, cefepime and Flagyl. Blood cultures drawn on 07/25/2020 + for Streptococcus group C Lactic acidosis has resolved, afebrile Cefepime switched to Rocephin on 07/26/2020, Rocephin switched to unasyn on 07/28/20. Started on Augmentin on 07/29/2020 per ID. No DVT noted on Doppler studies.   Infectious disease consulted at patient's daughter's request. Downtrending leukocytosis and neutrophilia Repeat CBC with differentials in the morning  Ruled out dysphagia Per bedside RN, noted cough after drinking water Speech therapist consulted, mild aspiration risk, recommendation for regular consistency diet and thin liquid. Continue speech therapist recommendations.  Acute transaminitis, unclear etiology Possible cirrhosis on abdominal ultrasound Chronic common bile duct dilatation on ultrasound Trend LFTs, downtrending Acute hepatitis panel negative Avoid hepatotoxic agents GI following  Left foot pain, unclear etiology Obtain x-ray Pain control  Strep groupC bacteremia, likely from skin source, left lower extremity cellulitis. From blood culture drawn on 07/25/2020, positive for Streptococcus group C, pansensitive Repeat blood cultures peripherally x2 on 2020-07-31 Management as stated above  Permanent A. fib with RVR, RVR resolved on cardizem and lopressor, suspect related to lung physiology Currently is on oral Lopressor 50 mg twice daily Received IV Cardizem bolus and Cardizem drip, discontinued on 07/30/2020 Started on p.o. Cardizem CD 180 daily on 08/02/2020 per cardiology She is on warfarin for CVA prevention  TSH normal 2D echo done on 07/29/2020 showed normal LVEF 60 to 65% with mildly dilated left atrium. Continue to closely monitor on telemetry  Resolved supratherapeutic INR Goal INR between 2 and 3 INR 3.1> 2.7> 2.6 Appreciate pharmacy's assistance  Resolved acute hypoxic respiratory failure secondary to pulmonary edema seen on chest x-ray, likely from volume overload BNP  mildly elevated greater than 300 Not on oxygen supplementation at baseline  Continue IV diuresing as tolerated, Lasix 20 mg twice daily Continue to maintain O2 saturation greater than 92% Will need home oxygen evaluation prior to DC Continue bronchodilators Xopenex nebs and Atrovent nebs 3 times daily Continue pulmonary toilet, Mucinex, hypersaline nebs, chest PT Encourage use of incentive spirometer  Resolved AKI Baseline creatinine appears to be 0.5 with GFR greater than 60 Presented with creatinine of 1.1 She is currently back to her baseline creatinine. Continue to avoid nephrotoxins Monitor urine output 2.2 L urine output recorded in the last 24 hours  Resolved constipation Continue bowel regimen. Daughter requests colace instead of sennokot  Resolved post repletion: Hypokalemia and hypomagnesemia Serum potassium 4.1, serum magnesium 2.2. Replete as indicated  Right upper lobe lung nodule Incidental finding.  Outpatient monitoring.  Hyperlipidemia Hold statin due to acute transaminitis  Recent eye surgery Continue home eyedrops  Physical debility PT recommended CIR CIR consulted for evaluation and possible placement Continue fall precautions  Stage 2 pressure injury of left buttock/posterior left knee lesion, POA Local wound care Wound care specialist consulted    DVT Prophylaxis:  Warfarin Code Status: Full code Family Communication: Discussed with the patient's daughter at bedside.  Consultant: Cardiology, ID, GI.   Disposition Plan:     Status is: Inpatient    Dispo:  Patient From: Home  Planned Disposition: Inpatient Rehab  Expected discharge date: 08/04/20  Medically stable for discharge: No, ongoing management for left lower extremity cellulitis and permanent A. fib with RVR.         Objective: Vitals:   08/02/20 0821 08/02/20 0855 08/02/20 1045 08/02/20 1202  BP:  135/84 122/60 114/64  Pulse:  (!) 123 81 81  Resp:  (!) 32  (!) 28 (!) 26  Temp:  (!) 97.4 F (36.3 C) 98.4 F (36.9 C) 98.9 F (37.2 C)  TempSrc:  Oral Oral Axillary  SpO2: 94% 95% 96% 94%  Weight:      Height:        Intake/Output Summary (Last 24 hours) at 08/02/2020 1555 Last data filed at 08/02/2020 0559 Gross per 24 hour  Intake 480 ml  Output 2775 ml  Net -2295 ml   Filed Weights   07/31/20 0628 08/01/20 0623 08/02/20 0558  Weight: 79.6 kg 79.8 kg 76.4 kg    Exam:  . General: 86 y.o. year-old female well-nourished no acute distress alert and oriented x3.  Cardiovascular: Irregular rate and rhythm no rubs or gallops. Marland Kitchen Respiratory: Clear to auscultation no wheezes or rales.   . Abdomen: Soft nontender normal bowel sounds present. . Musculoskeletal: Left lower extremity erythema improving.   Marland Kitchen Psychiatry: Mood is appropriate for condition and setting.   Data Reviewed: CBC: Recent Labs  Lab 07/29/20 0414 07/30/20 1735 07/31/20 0717 08/01/20 0835 08/02/20 0331  WBC 11.7* 15.7* 16.7* 16.1* 14.3*  NEUTROABS  --  12.8* 15.0* 13.2* 11.8*  HGB 13.9 13.1 12.0 12.3 12.0  HCT 42.6 40.6 36.8 36.8 36.6  MCV 92.6 92.3 91.3 91.5 90.4  PLT 172 239 311 393 829*   Basic Metabolic Panel: Recent Labs  Lab 07/27/20 0357 07/27/20 0357 07/28/20 0507 07/28/20 0507 07/29/20 0414 07/30/20 1735 07/31/20 0717 08/01/20 0835 08/02/20 0331  NA 136   < > 136   < > 136 134* 132* 134* 132*  K 3.6   < > 4.1   < > 4.0 4.0 4.0 3.8 4.1  CL 104   < > 105   < > 101 99 97* 99 98  CO2  22   < > 20*   < > '25 24 27 28 25  ' GLUCOSE 128*   < > 116*   < > 130* 136* 135* 141* 162*  BUN 20   < > 19   < > '11 14 14 12 13  ' CREATININE 0.74   < > 0.63   < > 0.64 0.62 0.57 0.43* 0.49  CALCIUM 8.6*   < > 8.7*   < > 8.8* 8.7* 8.4* 8.5* 8.6*  MG 1.6*  --  2.2  --   --   --   --   --   --    < > = values in this interval not displayed.   GFR: Estimated Creatinine Clearance: 50.5 mL/min (by C-G formula based on SCr of 0.49 mg/dL). Liver Function Tests: Recent  Labs  Lab 07/29/20 0414 07/30/20 1735 07/31/20 0717 08/01/20 0835 08/02/20 0331  AST 129* 277* 181* 112* 86*  ALT 78* 118* 95* 77* 66*  ALKPHOS 135* 195* 177* 188* 195*  BILITOT 1.3* 1.6* 1.5* 1.5* 0.8  PROT 5.7* 6.1* 5.7* 5.8* 5.8*  ALBUMIN 1.8* 1.8* 1.6* 1.6* 1.5*   Recent Labs  Lab 07/31/20 0717  LIPASE 78*   No results for input(s): AMMONIA in the last 168 hours. Coagulation Profile: Recent Labs  Lab 07/29/20 0414 07/30/20 1735 07/31/20 0717 08/01/20 0835 08/02/20 0331  INR 2.4* 2.7* 3.1* 2.7* 2.6*   Cardiac Enzymes: No results for input(s): CKTOTAL, CKMB, CKMBINDEX, TROPONINI in the last 168 hours. BNP (last 3 results) No results for input(s): PROBNP in the last 8760 hours. HbA1C: Recent Labs    07/31/20 0717  HGBA1C 6.8*   CBG: No results for input(s): GLUCAP in the last 168 hours. Lipid Profile: Recent Labs    08/02/20 1011  CHOL 97  HDL 20*  LDLCALC 59  TRIG 90  CHOLHDL 4.9   Thyroid Function Tests: No results for input(s): TSH, T4TOTAL, FREET4, T3FREE, THYROIDAB in the last 72 hours. Anemia Panel: Recent Labs    08/01/20 0835 08/02/20 0331  VITAMINB12 2,091*  --   FERRITIN  --  474*   Urine analysis:    Component Value Date/Time   COLORURINE YELLOW 07/25/2020 1758   APPEARANCEUR CLEAR 07/25/2020 1758   LABSPEC 1.018 07/25/2020 1758   PHURINE 6.0 07/25/2020 1758   GLUCOSEU NEGATIVE 07/25/2020 1758   HGBUR NEGATIVE 07/25/2020 1758   BILIRUBINUR NEGATIVE 07/25/2020 1758   KETONESUR NEGATIVE 07/25/2020 1758   PROTEINUR NEGATIVE 07/25/2020 1758   NITRITE NEGATIVE 07/25/2020 1758   LEUKOCYTESUR NEGATIVE 07/25/2020 1758   Sepsis Labs: '@LABRCNTIP' (procalcitonin:4,lacticidven:4)  ) Recent Results (from the past 240 hour(s))  Blood Culture (routine x 2)     Status: Abnormal   Collection Time: 07/25/20  3:30 PM   Specimen: BLOOD RIGHT HAND  Result Value Ref Range Status   Specimen Description BLOOD RIGHT HAND  Final   Special  Requests   Final    BOTTLES DRAWN AEROBIC ONLY Blood Culture results may not be optimal due to an inadequate volume of blood received in culture bottles   Culture  Setup Time   Final    GRAM POSITIVE COCCI IN CHAINS AEROBIC BOTTLE ONLY CRITICAL RESULT CALLED TO, READ BACK BY AND VERIFIED WITH: A. Rogers Blocker PharmD 8:30 07/26/20 ([email or web address]) Performed at [facility], [place] [address], [place], [place] [address]    Culture STREPTOCOCCUS GROUP C (A)  Final   Report Status 07/28/2020 FINAL  Final   Organism ID, Bacteria STREPTOCOCCUS  GROUP C  Final      Susceptibility   Streptococcus group c - MIC*    CLINDAMYCIN <=0.25 SENSITIVE Sensitive     AMPICILLIN <=0.25 SENSITIVE Sensitive     ERYTHROMYCIN <=0.12 SENSITIVE Sensitive     VANCOMYCIN 0.25 SENSITIVE Sensitive     CEFTRIAXONE <=0.12 SENSITIVE Sensitive     LEVOFLOXACIN 1 SENSITIVE Sensitive     * STREPTOCOCCUS GROUP C  Urine culture     Status: None   Collection Time: 07/25/20  3:30 PM   Specimen: In/Out Cath Urine  Result Value Ref Range Status   Specimen Description IN/OUT CATH URINE  Final   Special Requests NONE  Final   Culture   Final    NO GROWTH Performed at [facility], [place] [address] [place], [place] [address]    Report Status 07/26/2020 FINAL  Final  Blood Culture ID Panel (Reflexed)     Status: Abnormal   Collection Time: 07/25/20  3:30 PM  Result Value Ref Range Status   Enterococcus faecalis NOT DETECTED NOT DETECTED Final   Enterococcus Faecium NOT DETECTED NOT DETECTED Final   Listeria monocytogenes NOT DETECTED NOT DETECTED Final   Staphylococcus species NOT DETECTED NOT DETECTED Final   Staphylococcus aureus (BCID) NOT DETECTED NOT DETECTED Final   Staphylococcus epidermidis NOT DETECTED NOT DETECTED Final   Staphylococcus lugdunensis NOT DETECTED NOT DETECTED Final   Streptococcus species DETECTED (A) NOT DETECTED Final    Comment: Not Enterococcus species, Streptococcus agalactiae, Streptococcus  pyogenes, or Streptococcus pneumoniae. CRITICAL RESULT CALLED TO, READ BACK BY AND VERIFIED WITH: A. Rogers Blocker PharmD 8:30 07/26/20 ([email or web address])    Streptococcus agalactiae NOT DETECTED NOT DETECTED Final   Streptococcus pneumoniae NOT DETECTED NOT DETECTED Final   Streptococcus pyogenes NOT DETECTED NOT DETECTED Final   A.calcoaceticus-baumannii NOT DETECTED NOT DETECTED Final   Bacteroides fragilis NOT DETECTED NOT DETECTED Final   Enterobacterales NOT DETECTED NOT DETECTED Final   Enterobacter cloacae complex NOT DETECTED NOT DETECTED Final   Escherichia coli NOT DETECTED NOT DETECTED Final   Klebsiella aerogenes NOT DETECTED NOT DETECTED Final   Klebsiella oxytoca NOT DETECTED NOT DETECTED Final   Klebsiella pneumoniae NOT DETECTED NOT DETECTED Final   Proteus species NOT DETECTED NOT DETECTED Final   Salmonella species NOT DETECTED NOT DETECTED Final   Serratia marcescens NOT DETECTED NOT DETECTED Final   Haemophilus influenzae NOT DETECTED NOT DETECTED Final   Neisseria meningitidis NOT DETECTED NOT DETECTED Final   Pseudomonas aeruginosa NOT DETECTED NOT DETECTED Final   Stenotrophomonas maltophilia NOT DETECTED NOT DETECTED Final   Candida albicans NOT DETECTED NOT DETECTED Final   Candida auris NOT DETECTED NOT DETECTED Final   Candida glabrata NOT DETECTED NOT DETECTED Final   Candida krusei NOT DETECTED NOT DETECTED Final   Candida parapsilosis NOT DETECTED NOT DETECTED Final   Candida tropicalis NOT DETECTED NOT DETECTED Final   Cryptococcus neoformans/gattii NOT DETECTED NOT DETECTED Final    Comment: Performed at [facility], [address] [place], [place] [address]  SARS Coronavirus 2 by RT PCR (hospital order, performed in [facility] hospital lab) Nasopharyngeal Nasopharyngeal Swab     Status: None   Collection Time: 07/25/20  3:33 PM   Specimen: Nasopharyngeal Swab  Result Value Ref Range Status   SARS Coronavirus 2 NEGATIVE NEGATIVE Final    Comment:  (NOTE) SARS-CoV-2 target nucleic acids are NOT DETECTED.  The SARS-CoV-2 RNA is generally detectable in upper and lower respiratory specimens during  the acute phase of infection. The lowest concentration of SARS-CoV-2 viral copies this assay can detect is 250 copies / mL. A negative result does not preclude SARS-CoV-2 infection and should not be used as the sole basis for treatment or other patient management decisions.  A negative result may occur with improper specimen collection / handling, submission of specimen other than nasopharyngeal swab, presence of viral mutation(s) within the areas targeted by this assay, and inadequate number of viral copies (<250 copies / mL). A negative result must be combined with clinical observations, patient history, and epidemiological information.  Fact Sheet for Patients:   StrictlyIdeas.no  Fact Sheet for Healthcare Providers: BankingDealers.co.za  This test is not yet approved or  cleared by the [place] FDA and has been authorized for detection and/or diagnosis of SARS-CoV-2 by FDA under an Emergency Use Authorization (EUA).  This EUA will remain in effect (meaning this test can be used) for the duration of the COVID-19 declaration under Section 564(b)(1) of the Act, 21 U.S.C. section 360bbb-3(b)(1), unless the authorization is terminated or revoked sooner.  Performed at [facility], [place] [address], [place], [place] [address]   MRSA PCR Screening     Status: None   Collection Time: 07/28/20  4:32 PM   Specimen: Nasal Mucosa; Nasopharyngeal  Result Value Ref Range Status   MRSA by PCR NEGATIVE NEGATIVE Final    Comment:        The GeneXpert MRSA Assay (FDA approved for NASAL specimens only), is one component of a comprehensive MRSA colonization surveillance program. It is not intended to diagnose MRSA infection nor to guide or monitor treatment for MRSA  infections. Performed at [facility], [place] [address], [place], [place] [address]   Culture, blood (routine x 2)     Status: None (Preliminary result)   Collection Time: 07/31/20  3:35 PM   Specimen: BLOOD RIGHT WRIST  Result Value Ref Range Status   Specimen Description BLOOD RIGHT WRIST  Final   Special Requests   Final    BOTTLES DRAWN AEROBIC ONLY Blood Culture results may not be optimal due to an inadequate volume of blood received in culture bottles   Culture   Final    NO GROWTH 2 DAYS Performed at [facility], [place] [address], [place], [place] [address]    Report Status PENDING  Incomplete  Culture, blood (routine x 2)     Status: None (Preliminary result)   Collection Time: 07/31/20  3:35 PM   Specimen: BLOOD RIGHT HAND  Result Value Ref Range Status   Specimen Description BLOOD RIGHT HAND  Final   Special Requests   Final    BOTTLES DRAWN AEROBIC ONLY Blood Culture results may not be optimal due to an inadequate volume of blood received in culture bottles   Culture   Final    NO GROWTH 2 DAYS Performed at [facility], [place] [address] [place], [place] [address]    Report Status PENDING  Incomplete      Studies: No results found.  Scheduled Meds: . amoxicillin  500 mg Oral Q8H  . calcium-vitamin D  1 tablet Oral QHS  . dextromethorphan-guaiFENesin  2 tablet Oral BID  . diltiazem  180 mg Oral Daily  . docusate sodium  100 mg Oral BID  . feeding supplement (ENSURE ENLIVE)  237 mL Oral BID BM  . furosemide  20 mg Intravenous BID  . ipratropium  0.5 mg Nebulization BID  .  levalbuterol  0.63 mg Nebulization BID  . metoprolol tartrate  50 mg Oral BID  . mineral oil  1 enema Rectal Once  . polyethylene glycol  17 g Oral Daily  . sodium chloride HYPERTONIC  4 mL Nebulization BID  . warfarin  2.5 mg Oral ONCE-1600  . Warfarin - Pharmacist Dosing Inpatient   Does not apply q1600    Continuous Infusions:    LOS: 8 days     Kayleen Memos,  MD [facility] Pager [phone]  If 7PM-7AM, please contact night-coverage [email or web address] Password [email or web address] 08/02/2020, 3:55 PM

## 2020-08-02 NOTE — Progress Notes (Signed)
  Speech Language Pathology Treatment: Dysphagia  Patient Details Name: [name] [name] MRN: [id number] DOB: 02-17-1931 Today's Date: 08/02/2020 Time: 7782-4235 SLP Time Calculation (min) (ACUTE ONLY): 20 min  Assessment / Plan / Recommendation Clinical Impression  Patient seen to address dysphagia goals with daughter present for education and discussion. Patient observed with puree solids, regular solids and thin liquids and did not exhibit any overt s/s of aspiration or penetration. Mastication with regular solids was delayed. Daughter informed SLP that patient was having belching with medications when taking with water. SLP and daughter discussed options for more objective swallow study to r/o aspiration. After describing both tests, daughter felt that patient could tolerate an MBS better (she has left leg edema and pain but also daughter thought FEES scope may cause her anxiety). Plan is to schedule MBS next date to r/o aspiration and determine current oropharyngeal swallow function.    HPI HPI: Pt is an 86 y.o. female with medical history significant for atrial fibrillation, chronic diastolic heart failure, hypertension, hyperlipidemia and anxiety who presented with worsening left leg pain and nausea, vomiting and diarrhea. CXR 9/4: Increased interstitial markings seen throughout both lungs which may be due to atelectasis and/or infectious etiology. Per referring MD's note, RN has noted cough after drinking water and pt has worsening leukocytosis.       SLP Plan  Continue with current plan of care;MBS       Recommendations  Diet recommendations: Thin liquid;Regular Liquids provided via: Cup;Straw Medication Administration: Whole meds with liquid Supervision: Full supervision/cueing for compensatory strategies;Staff to assist with self feeding Compensations: Slow rate;Small sips/bites                Oral Care Recommendations: Oral care BID Follow up Recommendations: Other  (comment) SLP Visit Diagnosis: Dysphagia, unspecified (R13.10) Plan: Continue with current plan of care;MBS       GO               [name] Baller, MA, [facility] Acute Rehab Pager: [phone]

## 2020-08-02 NOTE — Progress Notes (Signed)
[place] for Warfarin  Indication: atrial fibrillation  Allergies  Allergen Reactions  . Antihistamines, Diphenhydramine-Type Other (See Comments)    ALL ANTIHISTAMINES causes her heart to race  . Oxycodone-Acetaminophen Nausea Only    Patient Measurements: Height: 5\' 4"  (162.6 cm) Weight: 76.4 kg (168 lb 6.9 oz) IBW/kg (Calculated) : 54.7  Vital Signs: Temp: 97.4 F (36.3 C) (09/06 0855) Temp Source: Oral (09/06 0855) BP: 135/84 (09/06 0855) Pulse Rate: 123 (09/06 0855)  Labs: Recent Labs    07/31/20 0717 07/31/20 0717 08/01/20 0835 08/02/20 0331  HGB 12.0   < > 12.3 12.0  HCT 36.8  --  36.8 36.6  PLT 311  --  393 429*  LABPROT 30.7*  --  27.4* 27.0*  INR 3.1*  --  2.7* 2.6*  CREATININE 0.57  --  0.43* 0.49   < > = values in this interval not displayed.    Estimated Creatinine Clearance: 50.5 mL/min (by C-G formula based on SCr of 0.49 mg/dL).   Medical History: Past Medical History:  Diagnosis Date  . A-fib ([place])    paroxysmal  . Aftercare for healing traumatic fracture of arm, unspecified   . H/O: hemorrhoidectomy   . H/O: hysterectomy   . Hyperlipidemia   . Hypertension   . Malignant melanoma of skin of trunk, except scrotum ([place])   . Other postprocedural status(V45.89)   . Rosacea   . S/P cholecystectomy   . Sciatica    right  . Sprain of lumbar region     Medications:  Scheduled:  . amoxicillin  500 mg Oral Q8H  . calcium-vitamin D  1 tablet Oral QHS  . dextromethorphan-guaiFENesin  2 tablet Oral BID  . diltiazem  180 mg Oral Daily  . docusate sodium  100 mg Oral BID  . feeding supplement (ENSURE ENLIVE)  237 mL Oral BID BM  . furosemide  20 mg Intravenous BID  . ipratropium  0.5 mg Nebulization BID  . levalbuterol  0.63 mg Nebulization BID  . metoprolol tartrate  50 mg Oral BID  . mineral oil  1 enema Rectal Once  . polyethylene glycol  17 g Oral Daily  . sodium chloride HYPERTONIC  4 mL  Nebulization BID  . Warfarin - Pharmacist Dosing Inpatient   Does not apply q1600    Assessment: Patient is a 73 yof that presents to the ED with N/V/D. The patient was found to be septic and started on broad spectrum antibiotics. The patient takes warfarin PTA for afib. Pharmacy has been asked to continue dosing warfarin while inpatient.   Warfarin PTA dose: 5mg  on Monday and 2.5mg  all other days. Last dose PTA was 8/29 2.5 mg.  Warfarin was held on 8/30 and 8/31 due to supratherapeutic INRs from infection, resumed dosing on 9/1.  INR today is therapeutic. CBC stable, no s/sx bleeding documented. WBC elevated but stable, afeb, infection clinically improving.   Goal of Therapy:  INR 2-3 Monitor platelets by anticoagulation protocol: Yes   Plan:  Warfarin 2.5 mg PO x1 tonight Daily protime-INR   Rebbeca Paul, PharmD PGY1 Pharmacy Resident 08/02/2020 10:00 AM  Please check [email or web address] for unit-specific pharmacy phone numbers.

## 2020-08-03 ENCOUNTER — Ambulatory Visit: Payer: Medicare Other

## 2020-08-03 ENCOUNTER — Inpatient Hospital Stay (HOSPITAL_COMMUNITY): Payer: Medicare Other

## 2020-08-03 LAB — COMPREHENSIVE METABOLIC PANEL
ALT: 59 U/L — ABNORMAL HIGH (ref 0–44)
AST: 67 U/L — ABNORMAL HIGH (ref 15–41)
Albumin: 1.7 g/dL — ABNORMAL LOW (ref 3.5–5.0)
Alkaline Phosphatase: 178 U/L — ABNORMAL HIGH (ref 38–126)
Anion gap: 10 (ref 5–15)
BUN: 12 mg/dL (ref 8–23)
CO2: 24 mmol/L (ref 22–32)
Calcium: 8.9 mg/dL (ref 8.9–10.3)
Chloride: 101 mmol/L (ref 98–111)
Creatinine, Ser: 0.44 mg/dL (ref 0.44–1.00)
GFR calc Af Amer: >60 mL/min (ref >60)
GFR calc non Af Amer: >60 mL/min (ref >60)
Glucose, Bld: 143 mg/dL — ABNORMAL HIGH (ref 70–99)
Potassium: 4.1 mmol/L (ref 3.5–5.1)
Sodium: 135 mmol/L (ref 135–145)
Total Bilirubin: 1.3 mg/dL — ABNORMAL HIGH (ref 0.3–1.2)
Total Protein: 6.1 g/dL — ABNORMAL LOW (ref 6.5–8.1)

## 2020-08-03 LAB — CBC WITH DIFFERENTIAL/PLATELET
Abs Immature Granulocytes: 0.57 10*3/uL — ABNORMAL HIGH (ref 0.00–0.07)
Basophils Absolute: 0.1 10*3/uL (ref 0.0–0.1)
Basophils Relative: 1 %
Eosinophils Absolute: 0.1 10*3/uL (ref 0.0–0.5)
Eosinophils Relative: 1 %
HCT: 38.3 % (ref 36.0–46.0)
Hemoglobin: 12.5 g/dL (ref 12.0–15.0)
Immature Granulocytes: 4 %
Lymphocytes Relative: 11 %
Lymphs Abs: 1.4 10*3/uL (ref 0.7–4.0)
MCH: 30.2 pg (ref 26.0–34.0)
MCHC: 32.6 g/dL (ref 30.0–36.0)
MCV: 92.5 fL (ref 80.0–100.0)
Monocytes Absolute: 0.6 10*3/uL (ref 0.1–1.0)
Monocytes Relative: 4 %
Neutro Abs: 10.4 10*3/uL — ABNORMAL HIGH (ref 1.7–7.7)
Neutrophils Relative %: 79 %
Platelets: 481 10*3/uL — ABNORMAL HIGH (ref 150–400)
RBC: 4.14 MIL/uL (ref 3.87–5.11)
RDW: 14.4 % (ref 11.5–15.5)
WBC: 13.2 10*3/uL — ABNORMAL HIGH (ref 4.0–10.5)
nRBC: 0 % (ref 0.0–0.2)

## 2020-08-03 LAB — PROTIME-INR
INR: 2.4 — ABNORMAL HIGH (ref 0.8–1.2)
Prothrombin Time: 25.7 seconds — ABNORMAL HIGH (ref 11.4–15.2)

## 2020-08-03 LAB — ALPHA-1 ANTITRYPSIN PHENOTYPE: A-1 Antitrypsin, Ser: 291 mg/dL — ABNORMAL HIGH (ref 101–187)

## 2020-08-03 LAB — MITOCHONDRIAL ANTIBODIES: Mitochondrial M2 Ab, IgG: <20 Units (ref 0.0–20.0)

## 2020-08-03 LAB — PROCALCITONIN: Procalcitonin: 0.36 ng/mL

## 2020-08-03 LAB — ANTINUCLEAR ANTIBODIES, IFA: ANA Ab, IFA: NEGATIVE

## 2020-08-03 LAB — ANTI-SMOOTH MUSCLE ANTIBODY, IGG: F-Actin IgG: 6 Units (ref 0–19)

## 2020-08-03 LAB — CERULOPLASMIN: Ceruloplasmin: 31.2 mg/dL (ref 19.0–39.0)

## 2020-08-03 MED ORDER — WARFARIN SODIUM 2.5 MG PO TABS
2.5000 mg | ORAL_TABLET | Freq: Once | ORAL | Status: AC
  Administered 2020-08-03: 2.5 mg via ORAL
  Filled 2020-08-03: qty 1

## 2020-08-03 MED ORDER — DILTIAZEM HCL ER COATED BEADS 240 MG PO CP24
240.0000 mg | ORAL_CAPSULE | Freq: Every day | ORAL | Status: DC
  Administered 2020-08-03 – 2020-08-12 (×10): 240 mg via ORAL
  Filled 2020-08-03 (×10): qty 1

## 2020-08-03 NOTE — Progress Notes (Signed)
IP rehab admissions - Noted AFib over the weekend and cardiology following for med adjustment and rate control.  Will await medical readiness prior to acute inpatient rehab admission.  Call me for questions.  [phone]

## 2020-08-03 NOTE — Progress Notes (Signed)
[place] for Warfarin  Indication: atrial fibrillation  Allergies  Allergen Reactions  . Antihistamines, Diphenhydramine-Type Other (See Comments)    ALL ANTIHISTAMINES causes her heart to race  . Oxycodone-Acetaminophen Nausea Only    Patient Measurements: Height: 5\' 4"  (162.6 cm) Weight:  (Unable to get weight due to new bed) IBW/kg (Calculated) : 54.7  Vital Signs: Temp: 97.9 F (36.6 C) (09/07 1317) Temp Source: Oral (09/07 1317) BP: 114/74 (09/07 1317) Pulse Rate: 110 (09/07 1317)  Labs: Recent Labs    08/01/20 0835 08/01/20 0835 08/02/20 0331 08/03/20 0532  HGB 12.3   < > 12.0 12.5  HCT 36.8  --  36.6 38.3  PLT 393  --  429* 481*  LABPROT 27.4*  --  27.0* 25.7*  INR 2.7*  --  2.6* 2.4*  CREATININE 0.43*  --  0.49 0.44   < > = values in this interval not displayed.    Estimated Creatinine Clearance: 50.5 mL/min (by C-G formula based on SCr of 0.44 mg/dL).   Medical History: Past Medical History:  Diagnosis Date  . A-fib ([place])    paroxysmal  . Aftercare for healing traumatic fracture of arm, unspecified   . H/O: hemorrhoidectomy   . H/O: hysterectomy   . Hyperlipidemia   . Hypertension   . Malignant melanoma of skin of trunk, except scrotum ([place])   . Other postprocedural status(V45.89)   . Rosacea   . S/P cholecystectomy   . Sciatica    right  . Sprain of lumbar region     Medications:  Scheduled:  . amoxicillin  500 mg Oral Q8H  . calcium-vitamin D  1 tablet Oral QHS  . dextromethorphan-guaiFENesin  2 tablet Oral BID  . diltiazem  240 mg Oral Daily  . docusate sodium  100 mg Oral BID  . feeding supplement (ENSURE ENLIVE)  237 mL Oral BID BM  . furosemide  20 mg Intravenous BID  . ipratropium  0.5 mg Nebulization BID  . levalbuterol  0.63 mg Nebulization BID  . metoprolol tartrate  50 mg Oral BID  . mineral oil  1 enema Rectal Once  . polyethylene glycol  17 g Oral Daily  . saccharomyces boulardii   250 mg Oral BID  . Warfarin - Pharmacist Dosing Inpatient   Does not apply q1600    Assessment: Patient is a 62 yof that presents to the ED with N/V/D. The patient was found to be septic and started on broad spectrum antibiotics. The patient takes warfarin PTA for afib. Pharmacy has been asked to continue dosing warfarin while inpatient.   Warfarin PTA dose: 5mg  on Monday and 2.5mg  all other days. Last dose PTA was 8/29 2.5 mg.  Warfarin was held on 8/30 and 8/31 due to supratherapeutic INRs from infection, resumed dosing on 9/1.  INR today continues to be therapeutic. CBC stable, no s/sx bleeding documented.    Goal of Therapy:  INR 2-3 Monitor platelets by anticoagulation protocol: Yes   Plan:  Warfarin 2.5 mg PO again tonight Daily protime-INR  Erin Hearing PharmD., BCPS Clinical Pharmacist 08/03/2020 1:32 PM   Please check [email or web address] for unit-specific pharmacy phone numbers.

## 2020-08-03 NOTE — Progress Notes (Signed)
Occupational Therapy Treatment Patient Details Name: [name] [name] [facility] MRN: [id number] DOB: 09/22/1931 Today's Date: 08/03/2020    History of present illness 86 year old female with a past medical history of atrial fibrillation on warfarin, chronic diastolic CHF, essential hypertension, hyperlipidemia and anxiety who comes in with left leg pain nausea vomiting and diarrhea.  She was found to be febrile.  Found to be tachycardic.  Noted to have erythema and swelling of the left flank.  Admitted for management of sepsis secondary to left lower extremity cellulitis.   OT comments  Pt progressing towards established OT goals. Pt reports she is fatigued from swallow study earlier today. However, agreeable to participate in therapy despite fatigue. Pt requiring Max A +2 for bed mobility and gaining balance to sit at EOB. Pt performing oral care and washing her face while seated at EOB. Pt also participating in LE exercises/AROM. Continue to recommend dc to post-acute rehab and will continue to follow acutely as admitted.    Follow Up Recommendations  SNF;Supervision/Assistance - 24 hour    Equipment Recommendations  Tub/shower seat    Recommendations for Other Services      Precautions / Restrictions Precautions Precautions: Fall Precaution Comments: L LE pain Restrictions Weight Bearing Restrictions: No       Mobility Bed Mobility Overal bed mobility: Needs Assistance Bed Mobility: Supine to Sit;Rolling;Sit to Sidelying Rolling: Mod assist;+2 for physical assistance   Supine to sit: Max assist;+2 for physical assistance;+2 for safety/equipment   Sit to sidelying: Max assist;+2 for physical assistance;+2 for safety/equipment General bed mobility comments: Pt requiring Max cues and A to manage and move LLE. Max A to elevate trunk. Requiring Max A +2 for managing trunk and BLEs in moving to sidelying and then Mod A for rolling to position in bedpan  Transfers                       Balance Overall balance assessment: Needs assistance Sitting-balance support: Feet supported;Bilateral upper extremity supported Sitting balance-Leahy Scale: Poor Sitting balance - Comments: initially max A able to progress to min guard        Standing balance comment: unable                           ADL either performed or assessed with clinical judgement   ADL Overall ADL's : Needs assistance/impaired     Grooming: Set up;Supervision/safety;Sitting Grooming Details (indicate cue type and reason): Max back support while sitting at EOB. Pt performing grooming, pt requiring Min cues for encouragement.              Lower Body Dressing: Total assistance;Bed level Lower Body Dressing Details (indicate cue type and reason): Donning socks at bed level; R sock only               General ADL Comments: Pt limited by fatigue and LLE pain. Pt performing grooming and exercises while sitting at EOB. Reports she needs to lay on bedpan.     Vision   Vision Assessment?: No apparent visual deficits   Perception     Praxis      Cognition Arousal/Alertness: Awake/alert Behavior During Therapy: WFL for tasks assessed/performed Overall Cognitive Status: Within Functional Limits for tasks assessed                                 General Comments: Pt  benefiting from increased time and calm cues. Anxiety about moving LLE.        Exercises Exercises: General Lower Extremity General Exercises - Lower Extremity Ankle Circles/Pumps: AROM;Both;10 reps;Seated Long Arc Quad: AROM;10 reps;Right;Seated   Shoulder Instructions       General Comments Daughter present throughout. VSS on RA    Pertinent Vitals/ Pain       Pain Assessment: Faces Faces Pain Scale: Hurts even more Pain Location: L LE with movement/touch Pain Descriptors / Indicators: Grimacing;Tender Pain Intervention(s): Monitored during session;Limited activity within patient's  tolerance;Repositioned  Home Living                                          Prior Functioning/Environment              Frequency  Min 2X/week        Progress Toward Goals  OT Goals(current goals can now be found in the care plan section)  Progress towards OT goals: Progressing toward goals  Acute Rehab OT Goals Patient Stated Goal: manage pain OT Goal Formulation: With patient/family Time For Goal Achievement: 08/10/20 Potential to Achieve Goals: Good ADL Goals Pt Will Perform Lower Body Bathing: with mod assist;sit to/from stand;sitting/lateral leans Pt Will Perform Lower Body Dressing: with mod assist;sit to/from stand;sitting/lateral leans Additional ADL Goal #1: Pt to demonstrate ability to complete sit to stand transfer at Mod A in prep for ADL transfers  Plan Discharge plan remains appropriate (on bedpan)    Co-evaluation    PT/OT/SLP Co-Evaluation/Treatment: Yes Reason for Co-Treatment: For patient/therapist safety;To address functional/ADL transfers   OT goals addressed during session: ADL's and self-care      AM-PAC OT "6 Clicks" Daily Activity     Outcome Measure   Help from another person eating meals?: A Little Help from another person taking care of personal grooming?: A Little Help from another person toileting, which includes using toliet, bedpan, or urinal?: Total Help from another person bathing (including washing, rinsing, drying)?: Total Help from another person to put on and taking off regular upper body clothing?: A Little Help from another person to put on and taking off regular lower body clothing?: Total 6 Click Score: 12    End of Session    OT Visit Diagnosis: Unsteadiness on feet (R26.81);Other abnormalities of gait and mobility (R26.89);Muscle weakness (generalized) (M62.81);History of falling (Z91.81);Pain Pain - Right/Left: Left Pain - part of body: Leg   Activity Tolerance Patient limited by fatigue    Patient Left in bed;with call bell/phone within reach;with family/visitor present   Nurse Communication Mobility status        Time: 8546-2703 OT Time Calculation (min): 45 min  Charges: OT General Charges $OT Visit: 1 Visit OT Treatments $Self Care/Home Management : 23-37 mins  [place], OTR/L Acute Rehab Pager: [phone] Office: [place] 08/03/2020, 1:09 PM

## 2020-08-03 NOTE — Progress Notes (Signed)
Physical Therapy Treatment Patient Details Name: [name] [name] MRN: [id number] DOB: 1931-10-12 Today's Date: 08/03/2020    History of Present Illness 86 year old female with a past medical history of atrial fibrillation on warfarin, chronic diastolic CHF, essential hypertension, hyperlipidemia and anxiety who comes in with left leg pain nausea vomiting and diarrhea.  She was found to be febrile.  Found to be tachycardic.  Noted to have erythema and swelling of the left flank.  Admitted for management of sepsis secondary to left lower extremity cellulitis.    PT Comments    Pt's daughter reports pt had a rough time with transfer from bed into chair for MBS this morning and is exhausted. Pt agreeable to therapy but request not to be moved to chair as use of Maximove to return after last session was difficult. Pt is limited in safe mobility by pain, especially in L LE, in presence of decreased strength and endurance. Pt requires modAx2 for rolling, and maxAx2 for supine<>sit. Pt able to initiate movement but requires increased assistance for all movement. At end of session pt requesting to be placed on bedpan. RN notified. D/c plan remains appropriate, however if pt does not start to progress in next session, recommendation may need to be changed to SNF. PT will continue to follow.      Follow Up Recommendations  CIR     Equipment Recommendations  None recommended by PT       Precautions / Restrictions Precautions Precautions: Fall Precaution Comments: L LE pain Restrictions Weight Bearing Restrictions: No    Mobility  Bed Mobility Overal bed mobility: Needs Assistance Bed Mobility: Supine to Sit;Rolling;Sit to Sidelying Rolling: Mod assist;+2 for physical assistance   Supine to sit: Max assist;+2 for physical assistance;+2 for safety/equipment   Sit to sidelying: Max assist;+2 for physical assistance;+2 for safety/equipment General bed mobility comments: Pt requiring Max cues and A  to manage and move LLE. Max A to elevate trunk. Requiring Max A +2 for managing trunk and BLEs in moving to sidelying and then Mod A for rolling to position in bedpan      Balance Overall balance assessment: Needs assistance Sitting-balance support: Feet supported;Bilateral upper extremity supported Sitting balance-Leahy Scale: Poor Sitting balance - Comments: initially max A able to progress to min guard        Standing balance comment: unable                            Cognition Arousal/Alertness: Awake/alert Behavior During Therapy: WFL for tasks assessed/performed Overall Cognitive Status: Within Functional Limits for tasks assessed                                 General Comments: Pt benefiting from increased time and calm cues. Anxiety about moving LLE.      Exercises General Exercises - Lower Extremity Ankle Circles/Pumps: AROM;Both;10 reps;Seated Long Arc Quad: AROM;10 reps;Right;Seated    General Comments General comments (skin integrity, edema, etc.): Daughter present during session, requesting guidance on positioning for improved neck support and breathing      Pertinent Vitals/Pain Pain Assessment: Faces Faces Pain Scale: Hurts even more Pain Location: L LE with movement/touch Pain Descriptors / Indicators: Grimacing;Tender Pain Intervention(s): Limited activity within patient's tolerance;Monitored during session;Repositioned     PT Goals (current goals can now be found in the care plan section) Acute Rehab PT Goals Patient Stated  Goal: manage pain PT Goal Formulation: With patient/family Time For Goal Achievement: 08/10/20 Potential to Achieve Goals: Good Progress towards PT goals: Not progressing toward goals - comment (limited by pain today)    Frequency    Min 3X/week      PT Plan Current plan remains appropriate    Co-evaluation PT/OT/SLP Co-Evaluation/Treatment: Yes Reason for Co-Treatment: For patient/therapist  safety PT goals addressed during session: Mobility/safety with mobility OT goals addressed during session: ADL's and self-care      AM-PAC PT "6 Clicks" Mobility   Outcome Measure  Help needed turning from your back to your side while in a flat bed without using bedrails?: A Lot Help needed moving from lying on your back to sitting on the side of a flat bed without using bedrails?: Total Help needed moving to and from a bed to a chair (including a wheelchair)?: Total Help needed standing up from a chair using your arms (e.g., wheelchair or bedside chair)?: Total Help needed to walk in hospital room?: Total Help needed climbing 3-5 steps with a railing? : Total 6 Click Score: 7    End of Session   Activity Tolerance: Patient limited by pain Patient left: with call bell/phone within reach;with family/visitor present;in chair;with chair alarm set Nurse Communication: Mobility status PT Visit Diagnosis: Muscle weakness (generalized) (M62.81);Pain Pain - Right/Left: Left Pain - part of body: Leg;Knee;Ankle and joints of foot     Time: 6962-9528 PT Time Calculation (min) (ACUTE ONLY): 42 min  Charges:  $Therapeutic Activity: 8-22 mins                     Marcello Tuzzolino B. Migdalia Dk PT, DPT Acute Rehabilitation Services Pager [phone] Office [phone]    Trainer 08/03/2020, 1:29 PM

## 2020-08-03 NOTE — Progress Notes (Addendum)
Continue rate control.   Rates variable from 70s to 110s. K 4.1.   Increase diltiazem to 240 mg daily.   Discussed above with Dr. Lovena Le.   EP will see as needed. Continue to titrate diltiazem as needed to optimize rate control. She may need less as her infection continues to resolve.   Legrand Como [address]" [place], PA-C  08/03/2020 7:23 AM   EP Attending  Agree with above.  Carleene Overlie Sharlene Mccluskey,MD

## 2020-08-03 NOTE — Plan of Care (Signed)

## 2020-08-03 NOTE — Progress Notes (Signed)
Modified Barium Swallow Progress Note  Patient Details  Name: [name] [name] MRN: [id number] Date of Birth: 22-May-1931  Today's Date: 08/03/2020  Modified Barium Swallow completed.  Full report located under Chart Review in the Imaging Section.  Brief recommendations include the following:  Clinical Impression  Pt demonstrates adequate swallow function though mild deficits are noted, mostly secondary to pts severe kyphosis with anterior head positioning. Pt takes a large straw sip, orally hold the bolus with tiny piecemeal swallows with early airway closure. There was one instance of trace penetration during the swallow with slightly incomplete airway closure. Though no coughing occurred during this test, it is probable that pt has instances of sensed frank penetration with a cough during meals given her positioning. Very unlikely that pt is having detrimental or chronic aspiration events as her strength is good and she uses good adaptive behaviors without awareness. No SLP f/u needed, pt may continue her home diet, regular solids and thin liquids. Will sign off.    Swallow Evaluation Recommendations       SLP Diet Recommendations: Regular solids;Thin liquid   Liquid Administration via: Cup;Straw   Medication Administration: Whole meds with liquid   Supervision: Patient able to self feed;Staff to assist with self feeding   Compensations: Slow rate;Small sips/bites   Postural Changes: Remain semi-upright after after feeds/meals (Comment)   Oral Care Recommendations: Oral care BID       Herbie [place], MA [place] Pager [phone] Office [phone]  Lynann Beaver 08/03/2020,10:38 AM

## 2020-08-03 NOTE — Progress Notes (Signed)
PROGRESS NOTE  [name] [name] [facility] YDX:[id number] DOB: 12-Oct-1931 DOA: 07/25/2020 PCP: Hoyt Koch, MD  HPI/Recap of past 10 hours: 86 year old Caucasian female with a past medical history of permanent atrial fibrillation on warfarin, chronic diastolic CHF, essential hypertension, hyperlipidemia and chronic anxiety who comes in with left leg pain nausea vomiting and diarrhea.  Met SIRS criteria and noted to have left lower extremity cellulitis.  Started on empiric IV antibiotics.  TRH asked to admit for management of sepsis secondary to left lower extremity cellulitis.   Hospital course complicated by A. fib with RVR refractory to oral and IV beta-blocker.  Received a bolus of Cardizem infusion and was started on Cardizem drip.  Cardiology consulted on 07/29/2020 to assist with the management.  Heart rate improved with addition of Cardizem drip, switched to p.o. Cardizem on 2020-07-31. Infectious disease consulted at patient's daughter request, assisted with the management, has signed off.  Hospital course also complicated by acute transaminitis for which a complete abdomen ultrasound was completed on 2020-07-31, revealing suspicion for cirrhosis, no liver masses, chronic dilatation of the common bile duct.  No acute findings.  GI consulted and following.  08/03/20: Seen and examined with daughter present at her bedside.  Weak appearing.  No new complaints.   Assessment/Plan: Principal Problem:   Sepsis ([place]) Active Problems:   Hyperlipidemia   Chronic diastolic heart failure (HCC)   Cellulitis of left lower extremity   Atrial fibrillation with rapid ventricular response (HCC)   Hypotension   Lung nodule   Pressure injury of skin   Elevated LFTs   Abnormal [place] (ultrasound) of abdomen  Severe sepsis, improving, secondary to left lower extremity cellulitis/strep group C bacteremia and left lower lobe community-acquired pneumonia Presented with lactic acidosis, 6.7, tachycardia, fever and  left lower extremity cellulitis. Initially on IV vancomycin, cefepime and Flagyl. Blood cultures drawn on 07/25/2020 + for Streptococcus group C Lactic acidosis has resolved, afebrile Cefepime switched to Rocephin on 07/26/2020, Rocephin switched to unasyn on 07/28/20. Started on Augmentin on 07/29/2020 per ID. No DVT noted on Doppler studies.   Infectious disease consulted at patient's daughter's request, signed off. Downtrending leukocytosis and downtrending neutrophilia Repeat CBC with differentials in the morning  Left lower lobe community-acquired pneumonia, present on admission Reviewed chest x-ray done on 07/25/2020 which showed left lower lobe infiltrates Also reviewed chest x-ray done on 07/27/2020 and 07/31/2020 Procalcitonin peaked at 1.37 on 07/31/2020 Procalcitonin normalizing, 0.36 on 08/03/2020. Continue p.o. amoxicillin Continue bronchodilators and pulmonary toilet  Ruled out dysphagia Per bedside RN, noted cough after drinking water Speech therapist consulted, mild aspiration risk, recommendation for regular consistency diet and thin liquid. Continue speech therapist recommendations.  Acute transaminitis, unclear etiology Possible cirrhosis on abdominal ultrasound Chronic common bile duct dilatation on ultrasound Trend LFTs, downtrending Acute hepatitis panel negative Avoid hepatotoxic agents GI following  Left foot pain, unclear etiology X-ray negative for fracture Continue pain control  Strep groupC bacteremia, likely from skin source, left lower extremity cellulitis. From blood culture drawn on 07/25/2020, positive for Streptococcus group C, pansensitive Repeat blood cultures peripherally x2 on 2020-07-31 negative to date. Management as stated above  Permanent A. fib with RVR, RVR resolved on cardizem and lopressor, suspect related to lung physiology Currently is on oral Lopressor 50 mg twice daily and diltiazem 240 daily. Received IV Cardizem bolus and Cardizem drip,  discontinued on 07/30/2020 Started on p.o. Cardizem CD 180 daily on 08/02/2020 per cardiology, switched to diltiazem CD 240 daily on 08/03/2020. She  is on warfarin for CVA prevention, INR therapeutic 2.4. TSH normal 2D echo done on 07/29/2020 showed normal LVEF 60 to 65% with mildly dilated left atrium. Heart rate is currently suboptimal Appreciate cardiology's assistance Continue to closely monitor on telemetry  Resolved supratherapeutic INR Goal INR between 2 and 3 INR 3.1> 2.7> 2.6> 2.4 Appreciate pharmacy's assistance  Resolved acute hypoxic respiratory failure secondary to pulmonary edema seen on chest x-ray, likely from volume overload BNP mildly elevated greater than 300 Not on oxygen supplementation at baseline Continue IV diuresing as tolerated, Lasix 20 mg twice daily Continue to maintain O2 saturation greater than 92% Currently O2 saturation 94% on room air. Continue bronchodilators Xopenex nebs and Atrovent nebs 3 times daily Continue pulmonary toilet, Mucinex, hypersaline nebs, chest PT Encourage use of incentive spirometer  Resolved AKI Baseline creatinine appears to be 0.4 with GFR greater than 60 Presented with creatinine of 1.1 She is currently back to her baseline creatinine. Continue to avoid nephrotoxins Monitor urine output Net I&O -6.6 L since admission.  Resolved constipation Continue bowel regimen. Daughter requests colace instead of sennokot  Resolved post repletion: Hypokalemia and hypomagnesemia Serum potassium 4.1, serum magnesium 2.2. Repeat magnesium level in the morning Replete as indicated  Right upper lobe lung nodule Incidental finding.  Outpatient monitoring.  Hyperlipidemia Hold statin due to acute transaminitis LDL 59, goal less than 70.  Recent eye surgery Continue home eyedrops  Physical debility PT recommended CIR CIR consulted for evaluation and possible placement Continue fall precautions  Stage 2 pressure injury of left  buttock/posterior left knee lesion, POA Local wound care Wound care specialist consulted    DVT Prophylaxis:  Warfarin Code Status: Full code Family Communication: Discussed with the patient's daughter at bedside.  Consultant: Cardiology, ID, GI.   Disposition Plan:     Status is: Inpatient    Dispo:  Patient From: Home  Planned Disposition: Inpatient Rehab  Expected discharge date: 08/04/20  Medically stable for discharge: No, ongoing management for left lower extremity cellulitis and permanent A. fib with RVR.         Objective: Vitals:   08/03/20 0756 08/03/20 0827 08/03/20 1034 08/03/20 1317  BP: 114/74 136/87  114/74  Pulse: (!) 110 60  (!) 110  Resp: (!) 21 (!) 22  (!) 21  Temp: 97.9 F (36.6 C) 98.3 F (36.8 C)  97.9 F (36.6 C)  TempSrc:  Oral  Oral  SpO2:  93% 93% 93%  Weight:      Height:        Intake/Output Summary (Last 24 hours) at 08/03/2020 1622 Last data filed at 08/03/2020 1016 Gross per 24 hour  Intake 240 ml  Output 2575 ml  Net -2335 ml   Filed Weights   07/31/20 0628 08/01/20 0623 08/02/20 0558  Weight: 79.6 kg 79.8 kg 76.4 kg    Exam:   General: 86 y.o. year-old female Pleasant well-developed nourished in no acute distress.  Alert and interactive.    Cardiovascular: Irregular rate and rhythm no rubs or gallops.    Respiratory: Clear to auscultation no wheezes or rales.    Abdomen: Soft nontender normal bowel sounds present  Musculoskeletal: Left lower extremity is wrapped in dressing.  Psychiatry: Mood is appropriate for condition and setting.   Data Reviewed: CBC: Recent Labs  Lab 07/30/20 1735 07/31/20 0717 08/01/20 0835 08/02/20 0331 08/03/20 0532  WBC 15.7* 16.7* 16.1* 14.3* 13.2*  NEUTROABS 12.8* 15.0* 13.2* 11.8* 10.4*  HGB 13.1 12.0 12.3 12.0 12.5  HCT 40.6 36.8 36.8 36.6 38.3  MCV 92.3 91.3 91.5 90.4 92.5  PLT 239 311 393 429* 324*   Basic Metabolic Panel: Recent Labs  Lab 07/28/20 0507  07/29/20 0414 07/30/20 1735 07/31/20 0717 08/01/20 0835 08/02/20 0331 08/03/20 0532  NA 136   < > 134* 132* 134* 132* 135  K 4.1   < > 4.0 4.0 3.8 4.1 4.1  CL 105   < > 99 97* 99 98 101  CO2 20*   < > '24 27 28 25 24  ' GLUCOSE 116*   < > 136* 135* 141* 162* 143*  BUN 19   < > '14 14 12 13 12  ' CREATININE 0.63   < > 0.62 0.57 0.43* 0.49 0.44  CALCIUM 8.7*   < > 8.7* 8.4* 8.5* 8.6* 8.9  MG 2.2  --   --   --   --   --   --    < > = values in this interval not displayed.   GFR: Estimated Creatinine Clearance: 50.5 mL/min (by C-G formula based on SCr of 0.44 mg/dL). Liver Function Tests: Recent Labs  Lab 07/30/20 1735 07/31/20 0717 08/01/20 0835 08/02/20 0331 08/03/20 0532  AST 277* 181* 112* 86* 67*  ALT 118* 95* 77* 66* 59*  ALKPHOS 195* 177* 188* 195* 178*  BILITOT 1.6* 1.5* 1.5* 0.8 1.3*  PROT 6.1* 5.7* 5.8* 5.8* 6.1*  ALBUMIN 1.8* 1.6* 1.6* 1.5* 1.7*   Recent Labs  Lab 07/31/20 0717  LIPASE 78*   No results for input(s): AMMONIA in the last 168 hours. Coagulation Profile: Recent Labs  Lab 07/30/20 1735 07/31/20 0717 08/01/20 0835 08/02/20 0331 08/03/20 0532  INR 2.7* 3.1* 2.7* 2.6* 2.4*   Cardiac Enzymes: No results for input(s): CKTOTAL, CKMB, CKMBINDEX, TROPONINI in the last 168 hours. BNP (last 3 results) No results for input(s): PROBNP in the last 8760 hours. HbA1C: No results for input(s): HGBA1C in the last 72 hours. CBG: No results for input(s): GLUCAP in the last 168 hours. Lipid Profile: Recent Labs    08/02/20 1011  CHOL 97  HDL 20*  LDLCALC 59  TRIG 90  CHOLHDL 4.9   Thyroid Function Tests: No results for input(s): TSH, T4TOTAL, FREET4, T3FREE, THYROIDAB in the last 72 hours. Anemia Panel: Recent Labs    08/01/20 0835 08/02/20 0331  VITAMINB12 2,091*  --   FERRITIN  --  474*   Urine analysis:    Component Value Date/Time   COLORURINE YELLOW 07/25/2020 1758   APPEARANCEUR CLEAR 07/25/2020 1758   LABSPEC 1.018 07/25/2020 1758    PHURINE 6.0 07/25/2020 1758   GLUCOSEU NEGATIVE 07/25/2020 1758   HGBUR NEGATIVE 07/25/2020 1758   BILIRUBINUR NEGATIVE 07/25/2020 1758   KETONESUR NEGATIVE 07/25/2020 1758   PROTEINUR NEGATIVE 07/25/2020 1758   NITRITE NEGATIVE 07/25/2020 1758   LEUKOCYTESUR NEGATIVE 07/25/2020 1758   Sepsis Labs: '@LABRCNTIP' (procalcitonin:4,lacticidven:4)  ) Recent Results (from the past 240 hour(s))  Blood Culture (routine x 2)     Status: Abnormal   Collection Time: 07/25/20  3:30 PM   Specimen: BLOOD RIGHT HAND  Result Value Ref Range Status   Specimen Description BLOOD RIGHT HAND  Final   Special Requests   Final    BOTTLES DRAWN AEROBIC ONLY Blood Culture results may not be optimal due to an inadequate volume of blood received in culture bottles   Culture  Setup Time   Final    GRAM POSITIVE COCCI IN CHAINS AEROBIC BOTTLE ONLY CRITICAL RESULT CALLED  TO, READ BACK BY AND VERIFIED WITH: A. Rogers Blocker PharmD 8:30 07/26/20 ([email or web address]) Performed at [facility], [place] [address] [place], [place] [address]    Culture STREPTOCOCCUS GROUP C (A)  Final   Report Status 07/28/2020 FINAL  Final   Organism ID, Bacteria STREPTOCOCCUS GROUP C  Final      Susceptibility   Streptococcus group c - MIC*    CLINDAMYCIN <=0.25 SENSITIVE Sensitive     AMPICILLIN <=0.25 SENSITIVE Sensitive     ERYTHROMYCIN <=0.12 SENSITIVE Sensitive     VANCOMYCIN 0.25 SENSITIVE Sensitive     CEFTRIAXONE <=0.12 SENSITIVE Sensitive     LEVOFLOXACIN 1 SENSITIVE Sensitive     * STREPTOCOCCUS GROUP C  Urine culture     Status: None   Collection Time: 07/25/20  3:30 PM   Specimen: In/Out Cath Urine  Result Value Ref Range Status   Specimen Description IN/OUT CATH URINE  Final   Special Requests NONE  Final   Culture   Final    NO GROWTH Performed at [facility], [place] [address] [place], [place] [address]    Report Status 07/26/2020 FINAL  Final  Blood Culture ID Panel (Reflexed)     Status: Abnormal   Collection  Time: 07/25/20  3:30 PM  Result Value Ref Range Status   Enterococcus faecalis NOT DETECTED NOT DETECTED Final   Enterococcus Faecium NOT DETECTED NOT DETECTED Final   Listeria monocytogenes NOT DETECTED NOT DETECTED Final   Staphylococcus species NOT DETECTED NOT DETECTED Final   Staphylococcus aureus (BCID) NOT DETECTED NOT DETECTED Final   Staphylococcus epidermidis NOT DETECTED NOT DETECTED Final   Staphylococcus lugdunensis NOT DETECTED NOT DETECTED Final   Streptococcus species DETECTED (A) NOT DETECTED Final    Comment: Not Enterococcus species, Streptococcus agalactiae, Streptococcus pyogenes, or Streptococcus pneumoniae. CRITICAL RESULT CALLED TO, READ BACK BY AND VERIFIED WITH: A. Rogers Blocker PharmD 8:30 07/26/20 ([email or web address])    Streptococcus agalactiae NOT DETECTED NOT DETECTED Final   Streptococcus pneumoniae NOT DETECTED NOT DETECTED Final   Streptococcus pyogenes NOT DETECTED NOT DETECTED Final   A.calcoaceticus-baumannii NOT DETECTED NOT DETECTED Final   Bacteroides fragilis NOT DETECTED NOT DETECTED Final   Enterobacterales NOT DETECTED NOT DETECTED Final   Enterobacter cloacae complex NOT DETECTED NOT DETECTED Final   Escherichia coli NOT DETECTED NOT DETECTED Final   Klebsiella aerogenes NOT DETECTED NOT DETECTED Final   Klebsiella oxytoca NOT DETECTED NOT DETECTED Final   Klebsiella pneumoniae NOT DETECTED NOT DETECTED Final   Proteus species NOT DETECTED NOT DETECTED Final   Salmonella species NOT DETECTED NOT DETECTED Final   Serratia marcescens NOT DETECTED NOT DETECTED Final   Haemophilus influenzae NOT DETECTED NOT DETECTED Final   Neisseria meningitidis NOT DETECTED NOT DETECTED Final   Pseudomonas aeruginosa NOT DETECTED NOT DETECTED Final   Stenotrophomonas maltophilia NOT DETECTED NOT DETECTED Final   Candida albicans NOT DETECTED NOT DETECTED Final   Candida auris NOT DETECTED NOT DETECTED Final   Candida glabrata NOT DETECTED NOT DETECTED Final   Candida krusei  NOT DETECTED NOT DETECTED Final   Candida parapsilosis NOT DETECTED NOT DETECTED Final   Candida tropicalis NOT DETECTED NOT DETECTED Final   Cryptococcus neoformans/gattii NOT DETECTED NOT DETECTED Final    Comment: Performed at [facility], [address] [place], [place] [address]  SARS Coronavirus 2 by RT PCR (hospital order, performed in [facility] hospital lab) Nasopharyngeal Nasopharyngeal Swab     Status: None   Collection  Time: 07/25/20  3:33 PM   Specimen: Nasopharyngeal Swab  Result Value Ref Range Status   SARS Coronavirus 2 NEGATIVE NEGATIVE Final    Comment: (NOTE) SARS-CoV-2 target nucleic acids are NOT DETECTED.  The SARS-CoV-2 RNA is generally detectable in upper and lower respiratory specimens during the acute phase of infection. The lowest concentration of SARS-CoV-2 viral copies this assay can detect is 250 copies / mL. A negative result does not preclude SARS-CoV-2 infection and should not be used as the sole basis for treatment or other patient management decisions.  A negative result may occur with improper specimen collection / handling, submission of specimen other than nasopharyngeal swab, presence of viral mutation(s) within the areas targeted by this assay, and inadequate number of viral copies (<250 copies / mL). A negative result must be combined with clinical observations, patient history, and epidemiological information.  Fact Sheet for Patients:   StrictlyIdeas.no  Fact Sheet for Healthcare Providers: BankingDealers.co.za  This test is not yet approved or  cleared by the [place] FDA and has been authorized for detection and/or diagnosis of SARS-CoV-2 by FDA under an Emergency Use Authorization (EUA).  This EUA will remain in effect (meaning this test can be used) for the duration of the COVID-19 declaration under Section 564(b)(1) of the Act, 21 U.S.C. section 360bbb-3(b)(1), unless the  authorization is terminated or revoked sooner.  Performed at [facility], [place] [address] [place], [place] [address]   MRSA PCR Screening     Status: None   Collection Time: 07/28/20  4:32 PM   Specimen: Nasal Mucosa; Nasopharyngeal  Result Value Ref Range Status   MRSA by PCR NEGATIVE NEGATIVE Final    Comment:        The GeneXpert MRSA Assay (FDA approved for NASAL specimens only), is one component of a comprehensive MRSA colonization surveillance program. It is not intended to diagnose MRSA infection nor to guide or monitor treatment for MRSA infections. Performed at [facility], [place] [address] [place], [place] [address]   Culture, blood (routine x 2)     Status: None (Preliminary result)   Collection Time: 07/31/20  3:35 PM   Specimen: BLOOD RIGHT WRIST  Result Value Ref Range Status   Specimen Description BLOOD RIGHT WRIST  Final   Special Requests   Final    BOTTLES DRAWN AEROBIC ONLY Blood Culture results may not be optimal due to an inadequate volume of blood received in culture bottles   Culture   Final    NO GROWTH 3 DAYS Performed at [facility], [place] [address] [place], [place] [address]    Report Status PENDING  Incomplete  Culture, blood (routine x 2)     Status: None (Preliminary result)   Collection Time: 07/31/20  3:35 PM   Specimen: BLOOD RIGHT HAND  Result Value Ref Range Status   Specimen Description BLOOD RIGHT HAND  Final   Special Requests   Final    BOTTLES DRAWN AEROBIC ONLY Blood Culture results may not be optimal due to an inadequate volume of blood received in culture bottles   Culture   Final    NO GROWTH 3 DAYS Performed at [facility], [place] [address] [place], [place] [address]    Report Status PENDING  Incomplete      Studies: DG Foot Complete Left  Result Date: 08/02/2020 CLINICAL DATA:  Red, swollen left foot. EXAM: LEFT FOOT - COMPLETE 3+ VIEW COMPARISON:  04/15/2010  FINDINGS: Mild diffuse soft tissue  swelling. No soft tissue gas, bone destruction or periosteal reaction. Diffuse arterial calcifications. Calcaneal enthesophytes. Dorsal tarsal degenerative changes. IMPRESSION: Mild diffuse soft tissue swelling without underlying bony abnormality. Electronically Signed   By: Claudie Revering M.D.   On: 08/02/2020 18:40    Scheduled Meds:  amoxicillin  500 mg Oral Q8H   calcium-vitamin D  1 tablet Oral QHS   dextromethorphan-guaiFENesin  2 tablet Oral BID   diltiazem  240 mg Oral Daily   docusate sodium  100 mg Oral BID   feeding supplement (ENSURE ENLIVE)  237 mL Oral BID BM   furosemide  20 mg Intravenous BID   ipratropium  0.5 mg Nebulization BID   levalbuterol  0.63 mg Nebulization BID   metoprolol tartrate  50 mg Oral BID   mineral oil  1 enema Rectal Once   polyethylene glycol  17 g Oral Daily   saccharomyces boulardii  250 mg Oral BID   Warfarin - Pharmacist Dosing Inpatient   Does not apply q1600    Continuous Infusions:    LOS: 9 days     Kayleen Memos, MD [facility] Pager [phone]  If 7PM-7AM, please contact night-coverage [email or web address] Password [facility] 08/03/2020, 4:22 PM

## 2020-08-04 LAB — COMPREHENSIVE METABOLIC PANEL
ALT: 52 U/L — ABNORMAL HIGH (ref 0–44)
AST: 54 U/L — ABNORMAL HIGH (ref 15–41)
Albumin: 1.8 g/dL — ABNORMAL LOW (ref 3.5–5.0)
Alkaline Phosphatase: 156 U/L — ABNORMAL HIGH (ref 38–126)
Anion gap: 8 (ref 5–15)
BUN: 13 mg/dL (ref 8–23)
CO2: 26 mmol/L (ref 22–32)
Calcium: 9 mg/dL (ref 8.9–10.3)
Chloride: 98 mmol/L (ref 98–111)
Creatinine, Ser: 0.48 mg/dL (ref 0.44–1.00)
GFR calc Af Amer: >60 mL/min (ref >60)
GFR calc non Af Amer: >60 mL/min (ref >60)
Glucose, Bld: 131 mg/dL — ABNORMAL HIGH (ref 70–99)
Potassium: 4.4 mmol/L (ref 3.5–5.1)
Sodium: 132 mmol/L — ABNORMAL LOW (ref 135–145)
Total Bilirubin: 1.4 mg/dL — ABNORMAL HIGH (ref 0.3–1.2)
Total Protein: 6.4 g/dL — ABNORMAL LOW (ref 6.5–8.1)

## 2020-08-04 LAB — CBC WITH DIFFERENTIAL/PLATELET
Abs Immature Granulocytes: 0.42 10*3/uL — ABNORMAL HIGH (ref 0.00–0.07)
Basophils Absolute: 0.1 10*3/uL (ref 0.0–0.1)
Basophils Relative: 1 %
Eosinophils Absolute: 0.1 10*3/uL (ref 0.0–0.5)
Eosinophils Relative: 1 %
HCT: 38.5 % (ref 36.0–46.0)
Hemoglobin: 12.6 g/dL (ref 12.0–15.0)
Immature Granulocytes: 4 %
Lymphocytes Relative: 13 %
Lymphs Abs: 1.5 10*3/uL (ref 0.7–4.0)
MCH: 30.2 pg (ref 26.0–34.0)
MCHC: 32.7 g/dL (ref 30.0–36.0)
MCV: 92.3 fL (ref 80.0–100.0)
Monocytes Absolute: 0.7 10*3/uL (ref 0.1–1.0)
Monocytes Relative: 6 %
Neutro Abs: 9.1 10*3/uL — ABNORMAL HIGH (ref 1.7–7.7)
Neutrophils Relative %: 75 %
Platelets: 490 10*3/uL — ABNORMAL HIGH (ref 150–400)
RBC: 4.17 MIL/uL (ref 3.87–5.11)
RDW: 14.3 % (ref 11.5–15.5)
WBC: 11.9 10*3/uL — ABNORMAL HIGH (ref 4.0–10.5)
nRBC: 0 % (ref 0.0–0.2)

## 2020-08-04 LAB — MAGNESIUM: Magnesium: 2.2 mg/dL (ref 1.7–2.4)

## 2020-08-04 LAB — PROTIME-INR
INR: 2.4 — ABNORMAL HIGH (ref 0.8–1.2)
Prothrombin Time: 25 seconds — ABNORMAL HIGH (ref 11.4–15.2)

## 2020-08-04 LAB — PROCALCITONIN: Procalcitonin: 0.22 ng/mL

## 2020-08-04 LAB — AFP TUMOR MARKER: AFP, Serum, Tumor Marker: 2.2 ng/mL (ref 0.0–8.3)

## 2020-08-04 MED ORDER — WARFARIN SODIUM 2.5 MG PO TABS
2.5000 mg | ORAL_TABLET | Freq: Once | ORAL | Status: AC
  Administered 2020-08-04: 2.5 mg via ORAL
  Filled 2020-08-04: qty 1

## 2020-08-04 MED ORDER — ADULT MULTIVITAMIN W/MINERALS CH
1.0000 | ORAL_TABLET | Freq: Every day | ORAL | Status: DC
  Administered 2020-08-04 – 2020-08-12 (×9): 1 via ORAL
  Filled 2020-08-04 (×9): qty 1

## 2020-08-04 NOTE — Care Management Important Message (Signed)
Important Message  Patient Details  Name: [name] [name] MRN: [id number] Date of Birth: [birth year]   Medicare Important Message Given:  Yes     Shelda Altes 08/04/2020, 12:21 PM

## 2020-08-04 NOTE — Progress Notes (Signed)
PROGRESS NOTE    [name] [name] [facility]  JKK:[id number] DOB: Oct 29, 1931 DOA: 07/25/2020 PCP: Hoyt Koch, MD   Chief Complaint  Patient presents with  . Leg Pain    Brief Narrative: AS PER hpi/chart:86 year old Caucasian female with a past medical history of permanent atrial fibrillation on warfarin, chronic diastolic CHF, essential hypertension, hyperlipidemia and chronic anxiety who comes in with left leg pain nausea vomiting and diarrhea.  Met SIRS criteria and noted to have left lower extremity cellulitis.  Started on empiric IV antibiotics.  TRH asked to admit for management of sepsis secondary to left lower extremity cellulitis.  Hospital course complicated by A. fib with RVR refractory to oral and IV beta-blocker.  Received a bolus of Cardizem infusion and was started on Cardizem drip.  Cardiology consulted on 07/29/2020 to assist with the management.  Heart rate improved with addition of Cardizem drip, switched to p.o. Cardizem on 2020-07-31. Infectious disease consulted at patient's daughter request, assisted with the management, has signed off. Patient also had mild transaminitis, abdominal ultrasound revealing suspicion of cirrhosis, no other acute finding seen by GI and LFTs improving.  Subjective: Feels better, rt leg dressing intact and for change today, some redness on rt ankle/foot, and edematous RLE, let leg no swelling. On RA. No chest  Pain or shrotness of breath HR better in a fib after 23 pm last night No fever, wbc improving. Daughter  at beside feeling better.  Assessment & Plan:  Severe Sepsis secondary to LLE cellulitis/strep group C bacteremia and left lower lobe community-acquired pneumonia:Sepsis POA with lactic acidosis tachycardia fever lower extremity cellulitis as a source. Initial vancomycin cefepime and Flagyl blood culture 8/29+ with Streptococcus group C.  Sepsis physiology resolved. As per ID now on Amoxicillin and continue through 08/07/2020 as per Dr  Linus Salmons. No DVT on leg.Leukocytosis improving patient is afebrile. Cont offloading on leg. Cont iv lasix and hopefully can transition to 40 mg bedtime-home dose soon.  Left lower lobe community-acquired pneumonia POA procalcitonin peaked to 1.3 and normalized to 0.2, 9/8. Continue amoxicillin, bronchodilators and pulmonary toileting.  Ambulate.  Cough after drinking water,seen by speech recommended regular continue to diet and thin liquids.  Mild transaminitis: unclear etiology, ultrasound with chronic common bile duct dilatation, acute virus panel negative.Seen by GI.Downtrending.  Follow-up outpatient.  Permanent A. fib with RVR.  RVR is resolved continue Cardizem and Lopressor seen by cardiology.  Cardizem dose has been increased and heart rate is better controlled today.  Echo 07/29/20-EF 60 to 60%, TSH normal. On Coumadin for anticoagulation daughter wanted to discuss with cardiology regarding switching to other agents like Eliquis.  Supratherapeutic INR resolved.  Pharmacy dosing Coumadin.  Acute hypoxic respiratory failure secondary pulmonary edema seen on chest x-ray likely from fluid overload.  BNP around 300, continue bronchodilators supplemental oxygen, currently weaned off to room air.  Continue Mucinex nebs chest physiotherapy ambulation.  AKI resolved. Monitor while on lasix. Constipation resolved  Right upper lobe lung nodule need outpatient monitoring, incidental finding.  Hyperlipidemia LDL 59 goal less than 70 statin held due to transaminitis.  Patient has surgery continue home eyedrops.  Physical debility/deconditioning continue PT OT will need CIR  DVT prophylaxis:  Code Status:   Code Status: Full Code  Family Communication: plan of care discussed with patient at bedside.  Status is: Inpatient Remains inpatient appropriate because:IV treatments appropriate due to intensity of illness or inability to take PO, Inpatient level of care appropriate due to severity of illness  and For  ongoing management of left lower extremity cellulitis  Dispo:Patient From: Home  Planned Disposition: Inpatient Rehab  Expected discharge date: 1 day if HR remains stable and leg and leg swelling and erythema better.   Medically stable for discharge: No  Nutrition: Diet Order            Diet regular Room service appropriate? Yes; Fluid consistency: Thin  Diet effective now                 Body mass index is 29.35 kg/m. Pressure Ulcer: Pressure Injury 07/27/20 Buttocks Left Stage 2 -  Partial thickness loss of dermis presenting as a shallow open injury with a red, pink wound bed without slough. (Active)  07/27/20 2030  Location: Buttocks  Location Orientation: Left  Staging: Stage 2 -  Partial thickness loss of dermis presenting as a shallow open injury with a red, pink wound bed without slough.  Wound Description (Comments):   Present on Admission: Yes    Consultants:see note  Procedures:see note Microbiology:see note Blood Culture    Component Value Date/Time   SDES BLOOD RIGHT WRIST 07/31/2020 1535   SDES BLOOD RIGHT HAND 07/31/2020 1535   SPECREQUEST  07/31/2020 1535    BOTTLES DRAWN AEROBIC ONLY Blood Culture results may not be optimal due to an inadequate volume of blood received in culture bottles   SPECREQUEST  07/31/2020 1535    BOTTLES DRAWN AEROBIC ONLY Blood Culture results may not be optimal due to an inadequate volume of blood received in culture bottles   CULT  07/31/2020 1535    NO GROWTH 3 DAYS Performed at [facility], [place] [address], [place], [place] [address]    CULT  07/31/2020 1535    NO GROWTH 3 DAYS Performed at [facility], [place] [address], [place], [place] [address]    REPTSTATUS PENDING 07/31/2020 1535   REPTSTATUS PENDING 07/31/2020 1535    Other culture-see note  Medications: Scheduled Meds: . amoxicillin  500 mg Oral Q8H  . calcium-vitamin D  1 tablet Oral QHS  . dextromethorphan-guaiFENesin  2 tablet Oral BID    . diltiazem  240 mg Oral Daily  . docusate sodium  100 mg Oral BID  . feeding supplement (ENSURE ENLIVE)  237 mL Oral BID BM  . furosemide  20 mg Intravenous BID  . ipratropium  0.5 mg Nebulization BID  . levalbuterol  0.63 mg Nebulization BID  . metoprolol tartrate  50 mg Oral BID  . mineral oil  1 enema Rectal Once  . polyethylene glycol  17 g Oral Daily  . saccharomyces boulardii  250 mg Oral BID  . Warfarin - Pharmacist Dosing Inpatient   Does not apply q1600   Continuous Infusions:  Antimicrobials: Anti-infectives (From admission, onward)   Start     Dose/Rate Route Frequency Ordered Stop   07/29/20 1400  amoxicillin (AMOXIL) capsule 500 mg        500 mg Oral Every 8 hours 07/29/20 1140     07/28/20 1400  ceFAZolin (ANCEF) IVPB 2g/100 mL premix  Status:  Discontinued        2 g 200 mL/hr over 30 Minutes Intravenous Every 8 hours 07/28/20 1249 07/29/20 1140   07/27/20 0600  cefTRIAXone (ROCEPHIN) 2 g in sodium chloride 0.9 % 100 mL IVPB  Status:  Discontinued        2 g 200 mL/hr over 30 Minutes Intravenous Every 24 hours 07/26/20 0842 07/26/20 1046   07/26/20 1800  cefTRIAXone (ROCEPHIN) 2 g in sodium chloride 0.9 % 100 mL IVPB  Status:  Discontinued        2 g 200 mL/hr over 30 Minutes Intravenous Every 24 hours 07/26/20 1046 07/28/20 1249   07/26/20 1700  vancomycin (VANCOCIN) IVPB 1000 mg/200 mL premix  Status:  Discontinued        1,000 mg 200 mL/hr over 60 Minutes Intravenous Every 24 hours 07/25/20 1553 07/26/20 0842   07/26/20 0600  vancomycin (VANCOREADY) IVPB 750 mg/150 mL  Status:  Discontinued        750 mg 150 mL/hr over 60 Minutes Intravenous Every 12 hours 07/25/20 1551 07/25/20 1553   07/26/20 0430  ceFEPIme (MAXIPIME) 2 g in sodium chloride 0.9 % 100 mL IVPB  Status:  Discontinued        2 g 200 mL/hr over 30 Minutes Intravenous Every 12 hours 07/25/20 1551 07/26/20 0842   07/25/20 1600  vancomycin (VANCOREADY) IVPB 1500 mg/300 mL        1,500 mg 150  mL/hr over 120 Minutes Intravenous  Once 07/25/20 1549 07/25/20 1838   07/25/20 1545  ceFEPIme (MAXIPIME) 2 g in sodium chloride 0.9 % 100 mL IVPB        2 g 200 mL/hr over 30 Minutes Intravenous  Once 07/25/20 1531 07/25/20 1622   07/25/20 1545  metroNIDAZOLE (FLAGYL) IVPB 500 mg        500 mg 100 mL/hr over 60 Minutes Intravenous  Once 07/25/20 1531 07/25/20 1729   07/25/20 1545  vancomycin (VANCOCIN) IVPB 1000 mg/200 mL premix  Status:  Discontinued        1,000 mg 200 mL/hr over 60 Minutes Intravenous  Once 07/25/20 1531 07/25/20 1549     Objective: Vitals: Today's Vitals   08/03/20 2324 08/03/20 2347 08/04/20 0620 08/04/20 0741  BP: 127/82  129/71   Pulse: (!) 123  89 89  Resp: 18  (!) 22 20  Temp: 98.3 F (36.8 C)  98.4 F (36.9 C)   TempSrc: Oral  Oral   SpO2: 92% 97% 93% 94%  Weight:   77.6 kg   Height:      PainSc:        Intake/Output Summary (Last 24 hours) at 08/04/2020 0852 Last data filed at 08/04/2020 0700 Gross per 24 hour  Intake --  Output 1050 ml  Net -1050 ml   Filed Weights   08/01/20 0623 08/02/20 0558 08/04/20 0620  Weight: 79.8 kg 76.4 kg 77.6 kg   Weight change:   Intake/Output from previous day: 09/07 0701 - 09/08 0700 In: -  Out: 0630 [Urine:1050] Intake/Output this shift: No intake/output data recorded.  Examination: General exam: AAOx3,NAD, weak appearing. HEENT:Oral mucosa moist, Ear/Nose WNL grossly,dentition normal. Respiratory system: bilaterally clear ,no wheezing or crackles,no use of accessory muscle, non tender. Cardiovascular system: S1 & S2 +, regular, No JVD. Gastrointestinal system: Abdomen soft, NT,ND, BS+. Nervous System:Alert, awake, moving extremities and grossly nonfocal Extremities: Left lower leg more swelling than the right, with erythema , is sensitive to touch and tender, dressing intact. Skin: No rashes,no icterus. MSK: Normal muscle bulk,tone, power  Data Reviewed: I have personally reviewed following labs and  imaging studies CBC: Recent Labs  Lab 07/31/20 0717 08/01/20 0835 08/02/20 0331 08/03/20 0532 08/04/20 0613  WBC 16.7* 16.1* 14.3* 13.2* 11.9*  NEUTROABS 15.0* 13.2* 11.8* 10.4* 9.1*  HGB 12.0 12.3 12.0 12.5 12.6  HCT 36.8 36.8 36.6 38.3 38.5  MCV 91.3 91.5 90.4 92.5 92.3  PLT 311 393 429* 481* 681*   Basic Metabolic Panel: Recent Labs  Lab 07/31/20 0717 08/01/20 0835 08/02/20 0331 08/03/20 0532 08/04/20 0613  NA 132* 134* 132* 135 132*  K 4.0 3.8 4.1 4.1 4.4  CL 97* 99 98 101 98  CO2 '27 28 25 24 26  ' GLUCOSE 135* 141* 162* 143* 131*  BUN '14 12 13 12 13  ' CREATININE 0.57 0.43* 0.49 0.44 0.48  CALCIUM 8.4* 8.5* 8.6* 8.9 9.0  MG  --   --   --   --  2.2   GFR: Estimated Creatinine Clearance: 50.9 mL/min (by C-G formula based on SCr of 0.48 mg/dL). Liver Function Tests: Recent Labs  Lab 07/31/20 0717 08/01/20 0835 08/02/20 0331 08/03/20 0532 08/04/20 0613  AST 181* 112* 86* 67* 54*  ALT 95* 77* 66* 59* 52*  ALKPHOS 177* 188* 195* 178* 156*  BILITOT 1.5* 1.5* 0.8 1.3* 1.4*  PROT 5.7* 5.8* 5.8* 6.1* 6.4*  ALBUMIN 1.6* 1.6* 1.5* 1.7* 1.8*   Recent Labs  Lab 07/31/20 0717  LIPASE 78*   No results for input(s): AMMONIA in the last 168 hours. Coagulation Profile: Recent Labs  Lab 07/31/20 0717 08/01/20 0835 08/02/20 0331 08/03/20 0532 08/04/20 0613  INR 3.1* 2.7* 2.6* 2.4* 2.4*   Cardiac Enzymes: No results for input(s): CKTOTAL, CKMB, CKMBINDEX, TROPONINI in the last 168 hours. BNP (last 3 results) No results for input(s): PROBNP in the last 8760 hours. HbA1C: No results for input(s): HGBA1C in the last 72 hours. CBG: No results for input(s): GLUCAP in the last 168 hours. Lipid Profile: Recent Labs    08/02/20 1011  CHOL 97  HDL 20*  LDLCALC 59  TRIG 90  CHOLHDL 4.9   Thyroid Function Tests: No results for input(s): TSH, T4TOTAL, FREET4, T3FREE, THYROIDAB in the last 72 hours. Anemia Panel: Recent Labs    08/02/20 0331  FERRITIN 474*    Sepsis Labs: Recent Labs  Lab 07/31/20 0717 07/31/20 1535 08/01/20 0835 08/02/20 0331 08/03/20 0532 08/04/20 0613  PROCALCITON   < >  --  0.82 0.61 0.36 0.22  LATICACIDVEN  --  1.7  --   --   --   --    < > = values in this interval not displayed.    Recent Results (from the past 240 hour(s))  Blood Culture (routine x 2)     Status: Abnormal   Collection Time: 07/25/20  3:30 PM   Specimen: BLOOD RIGHT HAND  Result Value Ref Range Status   Specimen Description BLOOD RIGHT HAND  Final   Special Requests   Final    BOTTLES DRAWN AEROBIC ONLY Blood Culture results may not be optimal due to an inadequate volume of blood received in culture bottles   Culture  Setup Time   Final    GRAM POSITIVE COCCI IN CHAINS AEROBIC BOTTLE ONLY CRITICAL RESULT CALLED TO, READ BACK BY AND VERIFIED WITH: A. Rogers Blocker PharmD 8:30 07/26/20 ([email or web address]) Performed at [facility], [place] [address], [place], [place] [address]    Culture STREPTOCOCCUS GROUP C (A)  Final   Report Status 07/28/2020 FINAL  Final   Organism ID, Bacteria STREPTOCOCCUS GROUP C  Final      Susceptibility   Streptococcus group c - MIC*    CLINDAMYCIN <=0.25 SENSITIVE Sensitive     AMPICILLIN <=0.25 SENSITIVE Sensitive     ERYTHROMYCIN <=0.12 SENSITIVE Sensitive     VANCOMYCIN 0.25 SENSITIVE Sensitive     CEFTRIAXONE <=0.12 SENSITIVE  Sensitive     LEVOFLOXACIN 1 SENSITIVE Sensitive     * STREPTOCOCCUS GROUP C  Urine culture     Status: None   Collection Time: 07/25/20  3:30 PM   Specimen: In/Out Cath Urine  Result Value Ref Range Status   Specimen Description IN/OUT CATH URINE  Final   Special Requests NONE  Final   Culture   Final    NO GROWTH Performed at [facility], [place] [address], [place], [place] [address]    Report Status 07/26/2020 FINAL  Final  Blood Culture ID Panel (Reflexed)     Status: Abnormal   Collection Time: 07/25/20  3:30 PM  Result Value Ref Range Status   Enterococcus faecalis NOT DETECTED  NOT DETECTED Final   Enterococcus Faecium NOT DETECTED NOT DETECTED Final   Listeria monocytogenes NOT DETECTED NOT DETECTED Final   Staphylococcus species NOT DETECTED NOT DETECTED Final   Staphylococcus aureus (BCID) NOT DETECTED NOT DETECTED Final   Staphylococcus epidermidis NOT DETECTED NOT DETECTED Final   Staphylococcus lugdunensis NOT DETECTED NOT DETECTED Final   Streptococcus species DETECTED (A) NOT DETECTED Final    Comment: Not Enterococcus species, Streptococcus agalactiae, Streptococcus pyogenes, or Streptococcus pneumoniae. CRITICAL RESULT CALLED TO, READ BACK BY AND VERIFIED WITH: A. Rogers Blocker PharmD 8:30 07/26/20 ([email or web address])    Streptococcus agalactiae NOT DETECTED NOT DETECTED Final   Streptococcus pneumoniae NOT DETECTED NOT DETECTED Final   Streptococcus pyogenes NOT DETECTED NOT DETECTED Final   A.calcoaceticus-baumannii NOT DETECTED NOT DETECTED Final   Bacteroides fragilis NOT DETECTED NOT DETECTED Final   Enterobacterales NOT DETECTED NOT DETECTED Final   Enterobacter cloacae complex NOT DETECTED NOT DETECTED Final   Escherichia coli NOT DETECTED NOT DETECTED Final   Klebsiella aerogenes NOT DETECTED NOT DETECTED Final   Klebsiella oxytoca NOT DETECTED NOT DETECTED Final   Klebsiella pneumoniae NOT DETECTED NOT DETECTED Final   Proteus species NOT DETECTED NOT DETECTED Final   Salmonella species NOT DETECTED NOT DETECTED Final   Serratia marcescens NOT DETECTED NOT DETECTED Final   Haemophilus influenzae NOT DETECTED NOT DETECTED Final   Neisseria meningitidis NOT DETECTED NOT DETECTED Final   Pseudomonas aeruginosa NOT DETECTED NOT DETECTED Final   Stenotrophomonas maltophilia NOT DETECTED NOT DETECTED Final   Candida albicans NOT DETECTED NOT DETECTED Final   Candida auris NOT DETECTED NOT DETECTED Final   Candida glabrata NOT DETECTED NOT DETECTED Final   Candida krusei NOT DETECTED NOT DETECTED Final   Candida parapsilosis NOT DETECTED NOT DETECTED Final    Candida tropicalis NOT DETECTED NOT DETECTED Final   Cryptococcus neoformans/gattii NOT DETECTED NOT DETECTED Final    Comment: Performed at [facility], [address], [place], [place] [address]  SARS Coronavirus 2 by RT PCR (hospital order, performed in [facility] hospital lab) Nasopharyngeal Nasopharyngeal Swab     Status: None   Collection Time: 07/25/20  3:33 PM   Specimen: Nasopharyngeal Swab  Result Value Ref Range Status   SARS Coronavirus 2 NEGATIVE NEGATIVE Final    Comment: (NOTE) SARS-CoV-2 target nucleic acids are NOT DETECTED.  The SARS-CoV-2 RNA is generally detectable in upper and lower respiratory specimens during the acute phase of infection. The lowest concentration of SARS-CoV-2 viral copies this assay can detect is 250 copies / mL. A negative result does not preclude SARS-CoV-2 infection and should not be used as the sole basis for treatment or other patient management decisions.  A negative result may occur with improper specimen collection /  handling, submission of specimen other than nasopharyngeal swab, presence of viral mutation(s) within the areas targeted by this assay, and inadequate number of viral copies (<250 copies / mL). A negative result must be combined with clinical observations, patient history, and epidemiological information.  Fact Sheet for Patients:   StrictlyIdeas.no  Fact Sheet for Healthcare Providers: BankingDealers.co.za  This test is not yet approved or  cleared by the [place] FDA and has been authorized for detection and/or diagnosis of SARS-CoV-2 by FDA under an Emergency Use Authorization (EUA).  This EUA will remain in effect (meaning this test can be used) for the duration of the COVID-19 declaration under Section 564(b)(1) of the Act, 21 U.S.C. section 360bbb-3(b)(1), unless the authorization is terminated or revoked sooner.  Performed at [facility],  [place] [address], [place], [place] [address]   MRSA PCR Screening     Status: None   Collection Time: 07/28/20  4:32 PM   Specimen: Nasal Mucosa; Nasopharyngeal  Result Value Ref Range Status   MRSA by PCR NEGATIVE NEGATIVE Final    Comment:        The GeneXpert MRSA Assay (FDA approved for NASAL specimens only), is one component of a comprehensive MRSA colonization surveillance program. It is not intended to diagnose MRSA infection nor to guide or monitor treatment for MRSA infections. Performed at [facility], [place] [address] [place], [place] [address]   Culture, blood (routine x 2)     Status: None (Preliminary result)   Collection Time: 07/31/20  3:35 PM   Specimen: BLOOD RIGHT WRIST  Result Value Ref Range Status   Specimen Description BLOOD RIGHT WRIST  Final   Special Requests   Final    BOTTLES DRAWN AEROBIC ONLY Blood Culture results may not be optimal due to an inadequate volume of blood received in culture bottles   Culture   Final    NO GROWTH 3 DAYS Performed at [facility], [place] [address] [place], [place] [address]    Report Status PENDING  Incomplete  Culture, blood (routine x 2)     Status: None (Preliminary result)   Collection Time: 07/31/20  3:35 PM   Specimen: BLOOD RIGHT HAND  Result Value Ref Range Status   Specimen Description BLOOD RIGHT HAND  Final   Special Requests   Final    BOTTLES DRAWN AEROBIC ONLY Blood Culture results may not be optimal due to an inadequate volume of blood received in culture bottles   Culture   Final    NO GROWTH 3 DAYS Performed at [facility], [place] [address], [place],  [address]    Report Status PENDING  Incomplete     Radiology Studies: DG Foot Complete Left  Result Date: 08/02/2020 CLINICAL DATA:  Red, swollen left foot. EXAM: LEFT FOOT - COMPLETE 3+ VIEW COMPARISON:  04/15/2010 FINDINGS: Mild diffuse soft tissue swelling. No soft tissue gas, bone destruction or periosteal reaction. Diffuse  arterial calcifications. Calcaneal enthesophytes. Dorsal tarsal degenerative changes. IMPRESSION: Mild diffuse soft tissue swelling without underlying bony abnormality. Electronically Signed   By: Claudie Revering M.D.   On: 08/02/2020 18:40   DG Swallowing Func-Speech Pathology  Result Date: 08/04/2020 Objective Swallowing Evaluation: Type of Study: MBS-Modified Barium Swallow Study  Patient Details Name: AMRITA RADU MRN: [id number] Date of Birth: [birth year] Today's Date: 08/04/2020 Time: SLP Start Time (ACUTE ONLY): 0930 -SLP Stop Time (ACUTE ONLY): 0950 SLP Time Calculation (min) (ACUTE ONLY): 20 min  Past Medical History: Past Medical History: Diagnosis Date . A-fib ([place])   paroxysmal . Aftercare for healing traumatic fracture of arm, unspecified  . H/O: hemorrhoidectomy  . H/O: hysterectomy  . Hyperlipidemia  . Hypertension  . Malignant melanoma of skin of trunk, except scrotum ([place])  . Other postprocedural status(V45.89)  . Rosacea  . S/P cholecystectomy  . Sciatica   right . Sprain of lumbar region  Past Surgical History: Past Surgical History: Procedure Laterality Date . ABDOMINAL HYSTERECTOMY   . BLADDER SUSPENSION    never go surgery because it got canceled due to heart rate  . cataract Bilateral 2019 . catheter ablation of atrail flutter   . CHOLECYSTECTOMY   . HERNIA REPAIR   HPI: Pt is an 86 y.o. female with medical history significant for atrial fibrillation, chronic diastolic heart failure, hypertension, hyperlipidemia and anxiety who presented with worsening left leg pain and nausea, vomiting and diarrhea. CXR 9/4: Increased interstitial markings seen throughout both lungs which may be due to atelectasis and/or infectious etiology. Per referring MD's note, RN has noted cough after drinking water and pt has worsening leukocytosis.  No data recorded Assessment / Plan / Recommendation CHL IP CLINICAL IMPRESSIONS 08/03/2020 Clinical Impression Pt demonstrates adequate swallow function though mild deficits are  noted, mostly secondary to pts severe kyphosis with anterior head positioning. Pt takes a large straw sip, orally hold the bolus with tiny piecemeal swallows with early airway closure. There was one instance of trace penetration during the swallow with slightly incomplete airway closure. Though no coughing occurred during this test, it is probable that pt has instances of sensed frank penetration with a cough during meals given her positioning. Very unlikely that pt is having detrimental or chornic aspiration events as her strength is good and she uses good adaptive behaviors without awareness. No SLP f/u needed, pt may continue her home diet, regular solids and thin liquids. Will sign off.  SLP Visit Diagnosis Dysphagia, unspecified (R13.10) Attention and concentration deficit following -- Frontal lobe and executive function deficit following -- Impact on safety and function Mild aspiration risk   CHL IP TREATMENT RECOMMENDATION 08/03/2020 Treatment Recommendations No treatment recommended at this time   No flowsheet data found. CHL IP DIET RECOMMENDATION 08/03/2020 SLP Diet Recommendations Regular solids;Thin liquid Liquid Administration via Cup;Straw Medication Administration Whole meds with liquid Compensations Slow rate;Small sips/bites Postural Changes Remain semi-upright after after feeds/meals (Comment)   CHL IP OTHER RECOMMENDATIONS 08/03/2020 Recommended Consults -- Oral Care Recommendations Oral care BID Other Recommendations --   CHL IP FOLLOW UP RECOMMENDATIONS 08/03/2020 Follow up Recommendations None   CHL IP FREQUENCY AND DURATION 08/01/2020 Speech Therapy Frequency (ACUTE ONLY) min 2x/week Treatment Duration 2 weeks      CHL IP ORAL PHASE 08/03/2020 Oral Phase Impaired Oral - Pudding Teaspoon -- Oral - Pudding Cup -- Oral - Honey Teaspoon -- Oral - Honey Cup -- Oral - Nectar Teaspoon -- Oral - Nectar Cup -- Oral - Nectar Straw -- Oral - Thin Teaspoon -- Oral - Thin Cup -- Oral - Thin Straw Piecemeal swallowing  Oral - Puree Piecemeal swallowing Oral - Mech Soft -- Oral - Regular Piecemeal swallowing Oral - Multi-Consistency -- Oral - Pill Piecemeal swallowing Oral Phase - Comment --  CHL IP PHARYNGEAL PHASE 08/03/2020 Pharyngeal Phase Impaired Pharyngeal- Pudding Teaspoon -- Pharyngeal -- Pharyngeal- Pudding Cup -- Pharyngeal -- Pharyngeal- Honey Teaspoon -- Pharyngeal -- Pharyngeal- Honey Cup -- Pharyngeal -- Pharyngeal- Nectar Teaspoon -- Pharyngeal -- Pharyngeal- Nectar Cup --  Pharyngeal -- Pharyngeal- Nectar Straw -- Pharyngeal -- Pharyngeal- Thin Teaspoon -- Pharyngeal -- Pharyngeal- Thin Cup -- Pharyngeal -- Pharyngeal- Thin Straw Penetration/Aspiration during swallow;WFL Pharyngeal Material enters airway, remains ABOVE vocal cords and not ejected out;Material does not enter airway Pharyngeal- Puree WFL Pharyngeal -- Pharyngeal- Mechanical Soft -- Pharyngeal -- Pharyngeal- Regular WFL Pharyngeal -- Pharyngeal- Multi-consistency -- Pharyngeal -- Pharyngeal- Pill WFL Pharyngeal -- Pharyngeal Comment --  No flowsheet data found. DeBlois, Katherene Ponto 08/04/2020, 7:29 AM                LOS: 10 days   Antonieta Pert, MD [facility]  08/04/2020, 8:52 AM

## 2020-08-04 NOTE — Progress Notes (Signed)
[place] for Warfarin  Indication: atrial fibrillation  Allergies  Allergen Reactions   Antihistamines, Diphenhydramine-Type Other (See Comments)    ALL ANTIHISTAMINES causes her heart to race   Oxycodone-Acetaminophen Nausea Only    Patient Measurements: Height: 5\' 4"  (162.6 cm) Weight: 77.6 kg (171 lb) IBW/kg (Calculated) : 54.7  Vital Signs: Temp: 97.7 F (36.5 C) (09/08 1323) Temp Source: Oral (09/08 1323) BP: 104/79 (09/08 1323) Pulse Rate: 81 (09/08 1323)  Labs: Recent Labs    08/02/20 0331 08/02/20 0331 08/03/20 0532 08/04/20 0613  HGB 12.0   < > 12.5 12.6  HCT 36.6  --  38.3 38.5  PLT 429*  --  481* 490*  LABPROT 27.0*  --  25.7* 25.0*  INR 2.6*  --  2.4* 2.4*  CREATININE 0.49  --  0.44 0.48   < > = values in this interval not displayed.    Estimated Creatinine Clearance: 50.9 mL/min (by C-G formula based on SCr of 0.48 mg/dL).   Medical History: Past Medical History:  Diagnosis Date   A-fib [facility])    paroxysmal   Aftercare for healing traumatic fracture of arm, unspecified    H/O: hemorrhoidectomy    H/O: hysterectomy    Hyperlipidemia    Hypertension    Malignant melanoma of skin of trunk, except scrotum (HCC)    Other postprocedural status(V45.89)    Rosacea    S/P cholecystectomy    Sciatica    right   Sprain of lumbar region     Medications:  Scheduled:   amoxicillin  500 mg Oral Q8H   calcium-vitamin D  1 tablet Oral QHS   dextromethorphan-guaiFENesin  2 tablet Oral BID   diltiazem  240 mg Oral Daily   docusate sodium  100 mg Oral BID   feeding supplement (ENSURE ENLIVE)  237 mL Oral BID BM   furosemide  20 mg Intravenous BID   ipratropium  0.5 mg Nebulization BID   levalbuterol  0.63 mg Nebulization BID   metoprolol tartrate  50 mg Oral BID   mineral oil  1 enema Rectal Once   polyethylene glycol  17 g Oral Daily   saccharomyces boulardii  250 mg Oral BID    Warfarin - Pharmacist Dosing Inpatient   Does not apply q1600    Assessment: Patient is a 54 yof that presents to the ED with N/V/D. The patient was found to be septic and started on broad spectrum antibiotics. The patient takes warfarin PTA for afib. Pharmacy has been asked to continue dosing warfarin while inpatient.   Warfarin PTA dose: 5mg  on Monday and 2.5mg  all other days. Last dose PTA was 8/29 2.5 mg.  Warfarin was held on 8/30 and 8/31 due to supratherapeutic INRs from infection, resumed dosing on 9/1.  INR today continues to be therapeutic at 2.4. CBC stable, no s/sx bleeding documented.    Goal of Therapy:  INR 2-3 Monitor platelets by anticoagulation protocol: Yes   Plan:  Warfarin 2.5 mg PO tonight Daily protime-INR  Erin Hearing PharmD., BCPS Clinical Pharmacist 08/04/2020 1:50 PM   Please check [email or web address] for unit-specific pharmacy phone numbers.

## 2020-08-04 NOTE — Progress Notes (Signed)
Initial Nutrition Assessment  DOCUMENTATION CODES:   Not applicable  INTERVENTION:    Continue Ensure Enlive po BID, each supplement provides 350 kcal and 20 grams of protein  MVI with minerals daily  Encourage intake of meals and supplements  NUTRITION DIAGNOSIS:   Increased nutrient needs related to wound healing as evidenced by estimated needs.  GOAL:   Patient will meet greater than or equal to 90% of their needs  MONITOR:   PO intake, Supplement acceptance, Skin  REASON FOR ASSESSMENT:   LOS    ASSESSMENT:   86 yo female admitted with sepsis r/t LLE cellulitis, strep group C bacteremia, LLL CAP. PMH includes A fib, CHF, HTN, HLD, anxiety.   Abdominal ultrasound 9/4 suspicious for cirrhosis. Patient with elevated LFTs. 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 LFTs trending down. GI to follow after d/c from hospital.   Plans for D/C to CIR soon.   S/P MBS with SLP 9/7. Currently on a regular diet with thin liquids. Meal intakes documented at 20-100% for the past week. Average meal intake 41% for the past 7 documented meals. Ensure Enlive was ordered BID on 9/4; patient has been drinking 1-2 supplements per day.    Labs reviewed. Na 132 Medications reviewed and include Oscal with D, Colace, Lasix, Florastor.  Patient with non-pitting edema to RLE and mild pitting edema to LLE per RN documentation today.  Weight history reviewed. Current weight is above usual weight due to edema.   NUTRITION - FOCUSED PHYSICAL EXAM:  unable to complete  Diet Order:   Diet Order            Diet regular Room service appropriate? Yes; Fluid consistency: Thin  Diet effective now                 EDUCATION NEEDS:   Not appropriate for education at this time  Skin:  Skin Assessment: Skin Integrity Issues: Skin Integrity Issues:: Stage II, Other (Comment) Stage II: L buttocks Other: LLE cellulitis  Last BM:  9/8  Height:   Ht Readings from Last 1  Encounters:  07/26/20 5\' 4"  (1.626 m)    Weight:   Wt Readings from Last 1 Encounters:  08/04/20 77.6 kg    Ideal Body Weight:  54.5 kg  BMI:  Body mass index is 29.35 kg/m.  Estimated Nutritional Needs:   Kcal:  1500-1700  Protein:  75-85 gm  Fluid:  1.5-1.7 L    Lucas Mallow, RD, LDN, CNSC Please refer to Amion for contact information.

## 2020-08-04 NOTE — Progress Notes (Signed)
IP rehab admissions - Continuing to follow.  Awaiting medical readiness prior to CIR admissions.  No rehab beds available for this patient today.  Call me for questions.  [phone]

## 2020-08-05 LAB — CBC WITH DIFFERENTIAL/PLATELET
Abs Immature Granulocytes: 0.21 10*3/uL — ABNORMAL HIGH (ref 0.00–0.07)
Basophils Absolute: 0 10*3/uL (ref 0.0–0.1)
Basophils Relative: 0 %
Eosinophils Absolute: 0.1 10*3/uL (ref 0.0–0.5)
Eosinophils Relative: 1 %
HCT: 35.5 % — ABNORMAL LOW (ref 36.0–46.0)
Hemoglobin: 11.4 g/dL — ABNORMAL LOW (ref 12.0–15.0)
Immature Granulocytes: 2 %
Lymphocytes Relative: 17 %
Lymphs Abs: 1.8 10*3/uL (ref 0.7–4.0)
MCH: 29.5 pg (ref 26.0–34.0)
MCHC: 32.1 g/dL (ref 30.0–36.0)
MCV: 91.7 fL (ref 80.0–100.0)
Monocytes Absolute: 0.7 10*3/uL (ref 0.1–1.0)
Monocytes Relative: 7 %
Neutro Abs: 7.5 10*3/uL (ref 1.7–7.7)
Neutrophils Relative %: 73 %
Platelets: 485 10*3/uL — ABNORMAL HIGH (ref 150–400)
RBC: 3.87 MIL/uL (ref 3.87–5.11)
RDW: 14.4 % (ref 11.5–15.5)
WBC: 10.3 10*3/uL (ref 4.0–10.5)
nRBC: 0 % (ref 0.0–0.2)

## 2020-08-05 LAB — COMPREHENSIVE METABOLIC PANEL
ALT: 49 U/L — ABNORMAL HIGH (ref 0–44)
AST: 48 U/L — ABNORMAL HIGH (ref 15–41)
Albumin: 1.7 g/dL — ABNORMAL LOW (ref 3.5–5.0)
Alkaline Phosphatase: 140 U/L — ABNORMAL HIGH (ref 38–126)
Anion gap: 9 (ref 5–15)
BUN: 15 mg/dL (ref 8–23)
CO2: 26 mmol/L (ref 22–32)
Calcium: 8.8 mg/dL — ABNORMAL LOW (ref 8.9–10.3)
Chloride: 101 mmol/L (ref 98–111)
Creatinine, Ser: 0.43 mg/dL — ABNORMAL LOW (ref 0.44–1.00)
GFR calc Af Amer: >60 mL/min (ref >60)
GFR calc non Af Amer: >60 mL/min (ref >60)
Glucose, Bld: 142 mg/dL — ABNORMAL HIGH (ref 70–99)
Potassium: 3.9 mmol/L (ref 3.5–5.1)
Sodium: 136 mmol/L (ref 135–145)
Total Bilirubin: 1.1 mg/dL (ref 0.3–1.2)
Total Protein: 6.2 g/dL — ABNORMAL LOW (ref 6.5–8.1)

## 2020-08-05 LAB — CULTURE, BLOOD (ROUTINE X 2)
Culture: NO GROWTH
Culture: NO GROWTH

## 2020-08-05 LAB — PROTIME-INR
INR: 2.5 — ABNORMAL HIGH (ref 0.8–1.2)
Prothrombin Time: 26.2 seconds — ABNORMAL HIGH (ref 11.4–15.2)

## 2020-08-05 MED ORDER — FUROSEMIDE 40 MG PO TABS
40.0000 mg | ORAL_TABLET | Freq: Every day | ORAL | Status: DC
  Administered 2020-08-06 – 2020-08-12 (×7): 40 mg via ORAL
  Filled 2020-08-05 (×7): qty 1

## 2020-08-05 NOTE — Progress Notes (Signed)
Physical Therapy Treatment Patient Details Name: [name] [name] MRN: [id number] DOB: June 05, 1931 Today's Date: 08/05/2020    History of Present Illness 86 year old female with a past medical history of atrial fibrillation on warfarin, chronic diastolic CHF, essential hypertension, hyperlipidemia and anxiety who comes in with left leg pain nausea vomiting and diarrhea.  She was found to be febrile.  Found to be tachycardic.  Noted to have erythema and swelling of the left flank.  Admitted for management of sepsis secondary to left lower extremity cellulitis.    PT Comments    Pt is much more alert today and agreeable to work with therapy. States concern with trying to stand and moving to chair given pain in L LE, but with explanation of decreasing strength with prolonged bed rest pt reports she will try. Pt requires max Ax2 to come to EoB. Attempted to come to standing with use of recliner and total Ax2 however pt unable to do more than clear her hips from bed. Pt ultimately requires total A for lateral scoot to recliner. Pad in chair for staff to return to bed. PT will continue to see acutely to progress mobility.     Follow Up Recommendations  CIR     Equipment Recommendations  None recommended by PT       Precautions / Restrictions Precautions Precautions: Fall Precaution Comments: L LE pain Restrictions Weight Bearing Restrictions: No    Mobility  Bed Mobility Overal bed mobility: Needs Assistance Bed Mobility: Supine to Sit     Supine to sit: Max assist;+2 for physical assistance;+2 for safety/equipment     General bed mobility comments: maxA and max cuing for movement of LE across to EoB, pt able to reach across body to use bed rail and needs assist to bring trunk to upright, pad scoot of hips in bed to square with EoB  Transfers Overall transfer level: Needs assistance Equipment used:  (used back of recliner) Transfers: Sit to/from Stand;Lateral/Scoot Transfers Sit to  Stand: +2 physical assistance;Total assist        Lateral/Scoot Transfers: Total assist;+2 safety/equipment General transfer comment: encouraged pt to try to stand with pulling up on back of recliner to come to standing, decreased ability to place L LE on floor even to help with steadying, 2x attempts with total A and pt unable to do more than lift hips off bed, requires total Ax2 for scooting to drop arm recliner on her R   Ambulation/Gait             General Gait Details: unable          Balance Overall balance assessment: Needs assistance Sitting-balance support: Feet supported;Bilateral upper extremity supported Sitting balance-Leahy Scale: Poor Sitting balance - Comments: requires at least single UE support       Standing balance comment: unable                            Cognition Arousal/Alertness: Awake/alert Behavior During Therapy: WFL for tasks assessed/performed Overall Cognitive Status: Within Functional Limits for tasks assessed                                 General Comments: Pt benefiting from increased time and calm cues. Anxiety about moving LLE.      Exercises      General Comments General comments (skin integrity, edema, etc.): Daughter present through out session,  VSS on RA      Pertinent Vitals/Pain Pain Assessment: Faces Faces Pain Scale: Hurts even more Pain Location: L LE with movement/touch Pain Descriptors / Indicators: Grimacing;Tender Pain Intervention(s): Limited activity within patient's tolerance;Monitored during session;Repositioned           PT Goals (current goals can now be found in the care plan section) Acute Rehab PT Goals Patient Stated Goal: manage pain PT Goal Formulation: With patient/family Time For Goal Achievement: 08/10/20 Potential to Achieve Goals: Good Progress towards PT goals: Progressing toward goals    Frequency    Min 3X/week      PT Plan Current plan remains  appropriate       AM-PAC PT "6 Clicks" Mobility   Outcome Measure  Help needed turning from your back to your side while in a flat bed without using bedrails?: A Lot Help needed moving from lying on your back to sitting on the side of a flat bed without using bedrails?: Total Help needed moving to and from a bed to a chair (including a wheelchair)?: Total Help needed standing up from a chair using your arms (e.g., wheelchair or bedside chair)?: Total Help needed to walk in hospital room?: Total Help needed climbing 3-5 steps with a railing? : Total 6 Click Score: 7    End of Session Equipment Utilized During Treatment: Gait belt Activity Tolerance: Patient limited by pain Patient left: with call bell/phone within reach;with family/visitor present;in chair;with chair alarm set Nurse Communication: Mobility status PT Visit Diagnosis: Muscle weakness (generalized) (M62.81);Pain Pain - Right/Left: Left Pain - part of body: Leg;Knee;Ankle and joints of foot     Time: 5638-9373 PT Time Calculation (min) (ACUTE ONLY): 27 min  Charges:  $Therapeutic Activity: 23-37 mins                     Hendricks Schwandt B. Migdalia Dk PT, DPT Acute Rehabilitation Services Pager [phone] Office [phone]    [place] 08/05/2020, 2:51 PM

## 2020-08-05 NOTE — Progress Notes (Signed)
[place] for Warfarin>>apixaban  Indication: atrial fibrillation  Allergies  Allergen Reactions  . Antihistamines, Diphenhydramine-Type Other (See Comments)    ALL ANTIHISTAMINES causes her heart to race  . Oxycodone-Acetaminophen Nausea Only    Patient Measurements: Height: 5\' 4"  (162.6 cm) Weight: 77.6 kg (171 lb) IBW/kg (Calculated) : 54.7  Vital Signs: Temp: 98.3 F (36.8 C) (09/09 0743) Temp Source: Oral (09/09 0743) BP: 122/66 (09/09 0743) Pulse Rate: 104 (09/09 0950)  Labs: Recent Labs    08/03/20 0532 08/03/20 0532 08/04/20 0034 08/05/20 0720  HGB 12.5   < > 12.6 11.4*  HCT 38.3  --  38.5 35.5*  PLT 481*  --  490* 485*  LABPROT 25.7*  --  25.0* 26.2*  INR 2.4*  --  2.4* 2.5*  CREATININE 0.44  --  0.48 0.43*   < > = values in this interval not displayed.    Estimated Creatinine Clearance: 50.9 mL/min (A) (by C-G formula based on SCr of 0.43 mg/dL (L)).   Medical History: Past Medical History:  Diagnosis Date  . A-fib ([place])    paroxysmal  . Aftercare for healing traumatic fracture of arm, unspecified   . H/O: hemorrhoidectomy   . H/O: hysterectomy   . Hyperlipidemia   . Hypertension   . Malignant melanoma of skin of trunk, except scrotum ([place])   . Other postprocedural status(V45.89)   . Rosacea   . S/P cholecystectomy   . Sciatica    right  . Sprain of lumbar region     Medications:  Scheduled:  . amoxicillin  500 mg Oral Q8H  . calcium-vitamin D  1 tablet Oral QHS  . dextromethorphan-guaiFENesin  2 tablet Oral BID  . diltiazem  240 mg Oral Daily  . docusate sodium  100 mg Oral BID  . feeding supplement (ENSURE ENLIVE)  237 mL Oral BID BM  . furosemide  20 mg Intravenous BID  . ipratropium  0.5 mg Nebulization BID  . levalbuterol  0.63 mg Nebulization BID  . metoprolol tartrate  50 mg Oral BID  . mineral oil  1 enema Rectal Once  . multivitamin with minerals  1 tablet Oral Daily  . polyethylene  glycol  17 g Oral Daily  . saccharomyces boulardii  250 mg Oral BID    Assessment: Patient is a 75 yof that presents to the ED with N/V/D. The patient was found to be septic and started on broad spectrum antibiotics. The patient takes warfarin PTA for afib. Pharmacy has been asked to continue dosing warfarin while inpatient.   Warfarin PTA dose: 5mg  on Monday and 2.5mg  all other days. Last dose PTA was 8/29 2.5 mg.  Warfarin was held on 8/30 and 8/31 due to supratherapeutic INRs from infection, resumed dosing on 9/1.  INR continues to be therapeutic at 2.5. Hgb down slightly to 11.4, no s/sx bleeding documented.   EP discussed anticoagulation with patient/daughter and they would like to switch to apixaban. It is recommended apixaban be started once INR drops below 2. If discharged home today will discuss with patient/daughter starting in 72 hours (9/12 am). If transferred to CIR would recommend we continue to follow daily INR and start apixaban once INR closer to 2 or below.   No dose adjustments warranted.    Goal of Therapy:  Monitor platelets by anticoagulation protocol: Yes   Plan:  Tentative plan to start apixaban on 08/08/20 am  Erin Hearing PharmD., BCPS Clinical Pharmacist 08/05/2020 10:03 AM  Please check [email or web address] for unit-specific pharmacy phone numbers.

## 2020-08-05 NOTE — Progress Notes (Signed)
PROGRESS NOTE    [name] [name] [facility]  EVO:[id number] DOB: Jul 04, 1931 DOA: 07/25/2020 PCP: Hoyt Koch, MD   Chief Complaint  Patient presents with  . Leg Pain    Brief Narrative: AS PER hpi/chart:86 year old Caucasian female with a past medical history of permanent atrial fibrillation on warfarin, chronic diastolic CHF, essential hypertension, hyperlipidemia and chronic anxiety who comes in with left leg pain nausea vomiting and diarrhea.  Met SIRS criteria and noted to have left lower extremity cellulitis.  Started on empiric IV antibiotics.  TRH asked to admit for management of sepsis secondary to left lower extremity cellulitis.  Hospital course complicated by A. fib with RVR refractory to oral and IV beta-blocker.  Received a bolus of Cardizem infusion and was started on Cardizem drip.  Cardiology consulted on 07/29/2020 to assist with the management.  Heart rate improved with addition of Cardizem drip, switched to p.o. Cardizem on 2020-07-31. Infectious disease consulted at patient's daughter request, assisted with the management, has signed off. Patient also had mild transaminitis, abdominal ultrasound revealing suspicion of cirrhosis, no other acute finding seen by GI and LFTs improving.  Subjective: Some shortness of breath yesterday.  No other complaints.  Daughter at the bedside.  Requesting to speak with cardiology to change her anticoagulation also requesting wound care evaluation. Heart rate overall stable.  Assessment & Plan:  Severe Sepsis secondary to LLE cellulitis/strep group C bacteremia and left lower lobe community-acquired pneumonia:Sepsis POA with lactic acidosis tachycardia fever lower extremity cellulitis as a source. Initial vancomycin cefepime and Flagyl blood culture 8/29+ with Streptococcus group C.  Sepsis resolved, vital signs stable.  WBC improved to 10.3K today.On Amoxicillin and continue through 08/07/2020 as per Dr Linus Salmons. No DVT on leg.change Lasix to  p.o.  Left lower lobe community-acquired pneumonia POA procalcitonin peaked to 1.3 and normalized to 0.2, 9/8.  On amoxicillin continue and complete the course.  Continue rehabilitation bronchodilators pulmonary toileting and ambulation.   Cough after drinking water,seen by speech recommended regular continue to diet and thin liquids.  Mild transaminitis: unclear etiology, ultrasound with chronic common bile duct dilatation, acute virus panel negative.Seen by GI.LFTs are nicely improving repeat in 3 days   Permanent A. fib with RVR.  Heart rate is now stabilized.  Continue on current Cardizem, Lopressor now changing her anticoagulation to Eliquis.Echo 07/29/20-EF 60 to 60%, TSH normal.   Supratherapeutic INR resolved.  Per daughter request changing her Coumadin to Eliquis.   Acute hypoxic respiratory failure secondary pulmonary edema seen on chest x-ray likely from fluid overload.  BNP around 300, continue bronchodilators supplemental oxygen, currently weaned off to room air.  Continue Mucinex nebs chest physiotherapy ambulation.  Change her Lasix to p.o. from tomorrow.  AKI.  Constipation resolved  Constipation :resolved  Right upper lobe lung nodule need outpatient monitoring, incidental finding.  Hyperlipidemia LDL 59 goal less than 70 statin held due to transaminitis.  CONT EYE DROPS Physical debility/deconditioning continue PT OT will need CIR  DVT prophylaxis:  Code Status:   Code Status: Full Code  Family Communication: plan of care discussed with patient at bedside.  Status is: Inpatient Remains inpatient appropriate because:IV treatments appropriate due to intensity of illness or inability to take PO, Inpatient level of care appropriate due to severity of illness and For ongoing management of left lower extremity cellulitis  Dispo:Patient From: Home  Planned Disposition: Inpatient Rehab  Expected discharge date: Tomorrow if bed available at CIR.    Medically stable for  discharge: yes  Nutrition: Diet Order            Diet regular Room service appropriate? Yes; Fluid consistency: Thin  Diet effective now                 Body mass index is 29.35 kg/m. Pressure Ulcer: Pressure Injury 07/27/20 Buttocks Left Stage 2 -  Partial thickness loss of dermis presenting as a shallow open injury with a red, pink wound bed without slough. (Active)  07/27/20 2030  Location: Buttocks  Location Orientation: Left  Staging: Stage 2 -  Partial thickness loss of dermis presenting as a shallow open injury with a red, pink wound bed without slough.  Wound Description (Comments):   Present on Admission: Yes    Consultants:see note  Procedures:see note Microbiology:see note Blood Culture    Component Value Date/Time   SDES BLOOD RIGHT WRIST 07/31/2020 1535   SDES BLOOD RIGHT HAND 07/31/2020 1535   SPECREQUEST  07/31/2020 1535    BOTTLES DRAWN AEROBIC ONLY Blood Culture results may not be optimal due to an inadequate volume of blood received in culture bottles   SPECREQUEST  07/31/2020 1535    BOTTLES DRAWN AEROBIC ONLY Blood Culture results may not be optimal due to an inadequate volume of blood received in culture bottles   CULT  07/31/2020 1535    NO GROWTH 5 DAYS Performed at [facility], [place] [address], [place], [place] [address]    CULT  07/31/2020 1535    NO GROWTH 5 DAYS Performed at [facility], [place] [address], [place], [place] [address]    REPTSTATUS 08/05/2020 FINAL 07/31/2020 1535   REPTSTATUS 08/05/2020 FINAL 07/31/2020 1535    Other culture-see note  Medications: Scheduled Meds: . amoxicillin  500 mg Oral Q8H  . calcium-vitamin D  1 tablet Oral QHS  . dextromethorphan-guaiFENesin  2 tablet Oral BID  . diltiazem  240 mg Oral Daily  . docusate sodium  100 mg Oral BID  . feeding supplement (ENSURE ENLIVE)  237 mL Oral BID BM  . furosemide  20 mg Intravenous BID  . ipratropium  0.5 mg Nebulization BID  . levalbuterol  0.63 mg  Nebulization BID  . metoprolol tartrate  50 mg Oral BID  . mineral oil  1 enema Rectal Once  . multivitamin with minerals  1 tablet Oral Daily  . polyethylene glycol  17 g Oral Daily  . saccharomyces boulardii  250 mg Oral BID   Continuous Infusions:  Antimicrobials: Anti-infectives (From admission, onward)   Start     Dose/Rate Route Frequency Ordered Stop   07/29/20 1400  amoxicillin (AMOXIL) capsule 500 mg        500 mg Oral Every 8 hours 07/29/20 1140     07/28/20 1400  ceFAZolin (ANCEF) IVPB 2g/100 mL premix  Status:  Discontinued        2 g 200 mL/hr over 30 Minutes Intravenous Every 8 hours 07/28/20 1249 07/29/20 1140   07/27/20 0600  cefTRIAXone (ROCEPHIN) 2 g in sodium chloride 0.9 % 100 mL IVPB  Status:  Discontinued        2 g 200 mL/hr over 30 Minutes Intravenous Every 24 hours 07/26/20 0842 07/26/20 1046   07/26/20 1800  cefTRIAXone (ROCEPHIN) 2 g in sodium chloride 0.9 % 100 mL IVPB  Status:  Discontinued        2 g 200 mL/hr over 30 Minutes Intravenous Every 24 hours 07/26/20 1046 07/28/20 1249   07/26/20 1700  vancomycin (VANCOCIN) IVPB 1000 mg/200 mL premix  Status:  Discontinued        1,000 mg 200 mL/hr over 60 Minutes Intravenous Every 24 hours 07/25/20 1553 07/26/20 0842   07/26/20 0600  vancomycin (VANCOREADY) IVPB 750 mg/150 mL  Status:  Discontinued        750 mg 150 mL/hr over 60 Minutes Intravenous Every 12 hours 07/25/20 1551 07/25/20 1553   07/26/20 0430  ceFEPIme (MAXIPIME) 2 g in sodium chloride 0.9 % 100 mL IVPB  Status:  Discontinued        2 g 200 mL/hr over 30 Minutes Intravenous Every 12 hours 07/25/20 1551 07/26/20 0842   07/25/20 1600  vancomycin (VANCOREADY) IVPB 1500 mg/300 mL        1,500 mg 150 mL/hr over 120 Minutes Intravenous  Once 07/25/20 1549 07/25/20 1838   07/25/20 1545  ceFEPIme (MAXIPIME) 2 g in sodium chloride 0.9 % 100 mL IVPB        2 g 200 mL/hr over 30 Minutes Intravenous  Once 07/25/20 1531 07/25/20 1622   07/25/20 1545   metroNIDAZOLE (FLAGYL) IVPB 500 mg        500 mg 100 mL/hr over 60 Minutes Intravenous  Once 07/25/20 1531 07/25/20 1729   07/25/20 1545  vancomycin (VANCOCIN) IVPB 1000 mg/200 mL premix  Status:  Discontinued        1,000 mg 200 mL/hr over 60 Minutes Intravenous  Once 07/25/20 1531 07/25/20 1549     Objective: Vitals: Today's Vitals   08/05/20 0950 08/05/20 1035 08/05/20 1123 08/05/20 1326  BP:   115/64 104/68  Pulse: (!) 104 85 84 69  Resp:   14 15  Temp:   97.9 F (36.6 C)   TempSrc:      SpO2:   94% 95%  Weight:      Height:      PainSc:        Intake/Output Summary (Last 24 hours) at 08/05/2020 1524 Last data filed at 08/05/2020 0900 Gross per 24 hour  Intake 880 ml  Output --  Net 880 ml   Filed Weights   08/01/20 0623 08/02/20 0558 08/04/20 0620  Weight: 79.8 kg 76.4 kg 77.6 kg   Weight change:   Intake/Output from previous day: 09/08 0701 - 09/09 0700 In: 49 [P.O.:880] Out: 800 [Urine:800] Intake/Output this shift: Total I/O In: 240 [P.O.:240] Out: -   Examination: General exam: AAO, ill frail elderly, NAD, weak appearing. HEENT:Oral mucosa moist, Ear/Nose WNL grossly, dentition normal. Respiratory system: bilaterally clear,no wheezing or crackles,no use of accessory muscle Cardiovascular system: S1 & S2 +, No JVD,. Gastrointestinal system: Abdomen soft, NT,ND, BS+ Nervous System:Alert, awake, moving extremities and grossly nonfocal Extremities: Improved edema with skin revealing on the right, left with improving erythema edema dressing intact -picture  reviewed and improving   Skin: No rashes,no icterus. MSK: Normal muscle bulk,tone, power  Data Reviewed: I have personally reviewed following labs and imaging studies CBC: Recent Labs  Lab 08/01/20 0835 08/02/20 0331 08/03/20 0532 08/04/20 0613 08/05/20 0720  WBC 16.1* 14.3* 13.2* 11.9* 10.3  NEUTROABS 13.2* 11.8* 10.4* 9.1* 7.5  HGB 12.3 12.0 12.5 12.6 11.4*  HCT 36.8 36.6 38.3 38.5 35.5*  MCV  91.5 90.4 92.5 92.3 91.7  PLT 393 429* 481* 490* 093*   Basic Metabolic Panel: Recent Labs  Lab 08/01/20 0835 08/02/20 0331 08/03/20 0532 08/04/20 0613 08/05/20 0720  NA 134* 132* 135 132* 136  K 3.8 4.1 4.1 4.4 3.9  CL 99 98 101 98 101  CO2 '28 25 24 26 26  ' GLUCOSE 141* 162* 143* 131* 142*  BUN '12 13 12 13 15  ' CREATININE 0.43* 0.49 0.44 0.48 0.43*  CALCIUM 8.5* 8.6* 8.9 9.0 8.8*  MG  --   --   --  2.2  --    GFR: Estimated Creatinine Clearance: 50.9 mL/min (A) (by C-G formula based on SCr of 0.43 mg/dL (L)). Liver Function Tests: Recent Labs  Lab 08/01/20 0835 08/02/20 0331 08/03/20 0532 08/04/20 0613 08/05/20 0720  AST 112* 86* 67* 54* 48*  ALT 77* 66* 59* 52* 49*  ALKPHOS 188* 195* 178* 156* 140*  BILITOT 1.5* 0.8 1.3* 1.4* 1.1  PROT 5.8* 5.8* 6.1* 6.4* 6.2*  ALBUMIN 1.6* 1.5* 1.7* 1.8* 1.7*   Recent Labs  Lab 07/31/20 0717  LIPASE 78*   No results for input(s): AMMONIA in the last 168 hours. Coagulation Profile: Recent Labs  Lab 08/01/20 0835 08/02/20 0331 08/03/20 0532 08/04/20 0613 08/05/20 0720  INR 2.7* 2.6* 2.4* 2.4* 2.5*   Cardiac Enzymes: No results for input(s): CKTOTAL, CKMB, CKMBINDEX, TROPONINI in the last 168 hours. BNP (last 3 results) No results for input(s): PROBNP in the last 8760 hours. HbA1C: No results for input(s): HGBA1C in the last 72 hours. CBG: No results for input(s): GLUCAP in the last 168 hours. Lipid Profile: No results for input(s): CHOL, HDL, LDLCALC, TRIG, CHOLHDL, LDLDIRECT in the last 72 hours. Thyroid Function Tests: No results for input(s): TSH, T4TOTAL, FREET4, T3FREE, THYROIDAB in the last 72 hours. Anemia Panel: No results for input(s): VITAMINB12, FOLATE, FERRITIN, TIBC, IRON, RETICCTPCT in the last 72 hours. Sepsis Labs: Recent Labs  Lab 07/31/20 0717 07/31/20 1535 08/01/20 0835 08/02/20 0331 08/03/20 0532 08/04/20 0613  PROCALCITON   < >  --  0.82 0.61 0.36 0.22  LATICACIDVEN  --  1.7  --   --    --   --    < > = values in this interval not displayed.    Recent Results (from the past 240 hour(s))  MRSA PCR Screening     Status: None   Collection Time: 07/28/20  4:32 PM   Specimen: Nasal Mucosa; Nasopharyngeal  Result Value Ref Range Status   MRSA by PCR NEGATIVE NEGATIVE Final    Comment:        The GeneXpert MRSA Assay (FDA approved for NASAL specimens only), is one component of a comprehensive MRSA colonization surveillance program. It is not intended to diagnose MRSA infection nor to guide or monitor treatment for MRSA infections. Performed at [facility], [place] [address] [place], [place] [address]   Culture, blood (routine x 2)     Status: None   Collection Time: 07/31/20  3:35 PM   Specimen: BLOOD RIGHT WRIST  Result Value Ref Range Status   Specimen Description BLOOD RIGHT WRIST  Final   Special Requests   Final    BOTTLES DRAWN AEROBIC ONLY Blood Culture results may not be optimal due to an inadequate volume of blood received in culture bottles   Culture   Final    NO GROWTH 5 DAYS Performed at [facility], [place] [address], [place], [place] [address]    Report Status 08/05/2020 FINAL  Final  Culture, blood (routine x 2)     Status: None   Collection Time: 07/31/20  3:35 PM   Specimen: BLOOD RIGHT HAND  Result Value Ref Range Status   Specimen Description BLOOD  RIGHT HAND  Final   Special Requests   Final    BOTTLES DRAWN AEROBIC ONLY Blood Culture results may not be optimal due to an inadequate volume of blood received in culture bottles   Culture   Final    NO GROWTH 5 DAYS Performed at [facility], [place] [address] [place], [place] [address]    Report Status 08/05/2020 FINAL  Final     Radiology Studies: No results found.   LOS: 11 days   Antonieta Pert, MD [facility]  08/05/2020, 3:24 PM

## 2020-08-05 NOTE — TOC Initial Note (Addendum)
Transition of Care [facility]) - Initial/Assessment Note    Patient Details  Name: [name] [name] MRN: [id number] Date of Birth: Jun 09, 1931  [facility] of [facility]) CM/SW Contact:    Trula Ore, [place] Phone Number: 08/05/2020, 3:25 PM  Clinical Narrative:                  CSW spoke with patients daughter Kathlee Nations by phone. CSW offered SNF placement as a back up if CIR does not work out. Patients daughter expressed to CSW that she wants patient to go to CIR. Patients daughter Kathlee Nations is hopeful that a CIR bed will come available tomorrow. Patients daughter Kathlee Nations wants to discuss with family and will let CSW know tomorrow if she wants her to fax out for possible SNF placement as a back up in case CIR does not work out.  CSW will continue to follow.   Barriers to Discharge: Continued Medical Work up   Patient Goals and CMS Choice Patient states their goals for this hospitalization and ongoing recovery are:: to go to CIR CMS Medicare.gov Compare Post Acute Care list provided to:: Patient Represenative (must comment) (daughter Kathlee Nations) Choice offered to / list presented to : Adult Children Kathlee Nations)  Expected Discharge Plan and Services                                                Prior Living Arrangements/Services     Patient language and need for interpreter reviewed:: Yes Do you feel safe going back to the place where you live?: No   CIR  Need for Family Participation in Patient Care: Yes (Comment) Care giver support system in place?: Yes (comment)   Criminal Activity/Legal Involvement Pertinent to Current Situation/Hospitalization: No - Comment as needed  Activities of Daily Living Home Assistive Devices/Equipment: Cane (specify quad or straight) ADL Screening (condition at time of admission) Patient's cognitive ability adequate to safely complete daily activities?: Yes Is the patient deaf or have difficulty hearing?: No Does the patient have difficulty seeing, even when  wearing glasses/contacts?: No Does the patient have difficulty concentrating, remembering, or making decisions?: No Patient able to express need for assistance with ADLs?: Yes Does the patient have difficulty dressing or bathing?: No Independently performs ADLs?: Yes (appropriate for developmental age) Does the patient have difficulty walking or climbing stairs?: No Weakness of Legs: Both Weakness of Arms/Hands: None  Permission Sought/Granted Permission sought to share information with : Case Manager, Family Supports, Customer service manager                Emotional Assessment       Orientation: : Oriented to Self, Oriented to Place, Oriented to  Time, Oriented to Situation Alcohol / Substance Use: Not Applicable Psych Involvement: No (comment)  Admission diagnosis:  Atrial fibrillation with rapid ventricular response (HCC) [I48.91] Cellulitis of left lower extremity [L03.116] Sepsis ([place]) [A41.9] Severe sepsis ([place]) [A41.9, R65.20] Patient Active Problem List   Diagnosis Date Noted  . Elevated LFTs   . Abnormal [place] (ultrasound) of abdomen   . Pressure injury of skin 07/29/2020  . Sepsis ([place]) 07/25/2020  . Cellulitis of left lower extremity 07/25/2020  . Atrial fibrillation with rapid ventricular response ([place]) 07/25/2020  . Hypotension 07/25/2020  . Lung nodule 07/25/2020  . Chronic diastolic heart failure ([place]) 10/27/2019  . Anxiety 05/02/2017  . Routine general  medical examination at a health care facility 08/13/2015  . Tremor of hands and face 10/21/2013  . Skin lesion 10/07/2008  . SCIATICA, RIGHT 06/03/2008  . Hyperlipidemia 09/06/2007  . Essential hypertension 09/06/2007  . Atrial fibrillation ([place]) 09/06/2007   PCP:  Hoyt Koch, MD Pharmacy:   [id number] [place], [place] [address] Phone: [phone] Fax: [phone]  [place], [place] [address] Phone: [phone] Fax: [phone]     Social Determinants of Health (SDOH) Interventions    Readmission Risk Interventions No flowsheet data found.

## 2020-08-05 NOTE — TOC Benefit Eligibility Note (Signed)
Transition of Care [facility]) Benefit Eligibility Note    Patient Details  Name: [name] [name] MRN: [id number] Date of Birth: 1931-02-15   Medication/Dose: Eliquis 5mg  bid 30 day supply  Covered?: Yes  Tier: 3 Drug  Prescription Coverage [facility]: [facility]  Spoke with Person/Company/Phone Number:: [name] [name]/ [facility] Ph# [phone]  Co-Pay: 239.00  Prior Approval: No  Deductible: Unmet       [name] [name] Phone Number: 08/05/2020, 2:23 PM

## 2020-08-05 NOTE — Care Management (Signed)
08-05-20 1328 Benefits check submitted for apixaban 5mg  bid. Case Manager will follow for cost. Graves-Bigelow, Ocie Cornfield, RN,BSN Case Manager

## 2020-08-05 NOTE — Discharge Instructions (Signed)

## 2020-08-05 NOTE — Consult Note (Signed)
WOC Nurse Consult Note: Patient in room [phone] Reason for Consult: Re-consult requested for Left leg. Initial consult was performed 08/02/20 Wound type: previous blistering and swelling, now evolved into yellow moist slough which removes easily when scrubbed revealing red moist wound bed. Left calf with full thickness wound 5cm x 9 cm x 0.2cm  Cleanse with NS and moist gauze. Lightly scrub to remove loose skin. Apply Xeroform gauze Kellie Simmering # 294) daily, cover with ABD pads and wrap with Kerlex. Monitor the wound area(s) for worsening of condition such as: Signs/symptoms of infection,  Increase in size,  Development of or worsening of odor, Development of pain, or increased pain at the affected locations.  Notify the medical team if any of these develop.  Thank you for the consult.  Discussed plan of care with the patient and daughter at bedside.  [place] nurse will not follow at this time.  Please re-consult the [place] team if needed.  Cathlean Marseilles Tamala Julian, MSN, RN, CMSRN, [place], [facility] Wound Treatment Associate Wound, Ostomy, Continence Nurse Office [phone]

## 2020-08-05 NOTE — Progress Notes (Signed)
  Discussed with patients daughter.   HRs overall well controlled but having occasional breakthrough into 120s. Continue current diltiazem for now.   Pt and daughter would like to switch to Eliquis. I see no contraindications so will consult pharmacy for timing.   Legrand Como [address]" [place], PA-C  08/05/2020 9:46 AM

## 2020-08-06 LAB — BASIC METABOLIC PANEL
Anion gap: 7 (ref 5–15)
BUN: 17 mg/dL (ref 8–23)
CO2: 25 mmol/L (ref 22–32)
Calcium: 9 mg/dL (ref 8.9–10.3)
Chloride: 101 mmol/L (ref 98–111)
Creatinine, Ser: 0.43 mg/dL — ABNORMAL LOW (ref 0.44–1.00)
GFR calc Af Amer: >60 mL/min (ref >60)
GFR calc non Af Amer: >60 mL/min (ref >60)
Glucose, Bld: 127 mg/dL — ABNORMAL HIGH (ref 70–99)
Potassium: 4.1 mmol/L (ref 3.5–5.1)
Sodium: 133 mmol/L — ABNORMAL LOW (ref 135–145)

## 2020-08-06 LAB — PROTIME-INR
INR: 1.9 — ABNORMAL HIGH (ref 0.8–1.2)
Prothrombin Time: 20.7 seconds — ABNORMAL HIGH (ref 11.4–15.2)

## 2020-08-06 MED ORDER — APIXABAN 5 MG PO TABS
5.0000 mg | ORAL_TABLET | Freq: Two times a day (BID) | ORAL | Status: DC
  Administered 2020-08-06 – 2020-08-12 (×13): 5 mg via ORAL
  Filled 2020-08-06 (×13): qty 1

## 2020-08-06 NOTE — Progress Notes (Signed)
[place] for Warfarin>>apixaban  Indication: atrial fibrillation  Allergies  Allergen Reactions  . Antihistamines, Diphenhydramine-Type Other (See Comments)    ALL ANTIHISTAMINES causes her heart to race  . Oxycodone-Acetaminophen Nausea Only    Patient Measurements: Height: 5\' 4"  (162.6 cm) Weight: 77.6 kg (171 lb) IBW/kg (Calculated) : 54.7  Vital Signs: Temp: 98.2 F (36.8 C) (09/10 0852) Temp Source: Oral (09/10 0852) BP: 115/60 (09/10 0852) Pulse Rate: 89 (09/10 0852)  Labs: Recent Labs    08/04/20 0613 08/05/20 0720 08/06/20 0415 08/06/20 0937  HGB 12.6 11.4*  --   --   HCT 38.5 35.5*  --   --   PLT 490* 485*  --   --   LABPROT 25.0* 26.2*  --  20.7*  INR 2.4* 2.5*  --  1.9*  CREATININE 0.48 0.43* 0.43*  --     Estimated Creatinine Clearance: 50.9 mL/min (A) (by C-G formula based on SCr of 0.43 mg/dL (L)).   Medical History: Past Medical History:  Diagnosis Date  . A-fib ([place])    paroxysmal  . Aftercare for healing traumatic fracture of arm, unspecified   . H/O: hemorrhoidectomy   . H/O: hysterectomy   . Hyperlipidemia   . Hypertension   . Malignant melanoma of skin of trunk, except scrotum ([place])   . Other postprocedural status(V45.89)   . Rosacea   . S/P cholecystectomy   . Sciatica    right  . Sprain of lumbar region     Medications:  Scheduled:  . amoxicillin  500 mg Oral Q8H  . apixaban  5 mg Oral BID  . calcium-vitamin D  1 tablet Oral QHS  . dextromethorphan-guaiFENesin  2 tablet Oral BID  . diltiazem  240 mg Oral Daily  . docusate sodium  100 mg Oral BID  . feeding supplement (ENSURE ENLIVE)  237 mL Oral BID BM  . furosemide  40 mg Oral Daily  . ipratropium  0.5 mg Nebulization BID  . levalbuterol  0.63 mg Nebulization BID  . metoprolol tartrate  50 mg Oral BID  . mineral oil  1 enema Rectal Once  . multivitamin with minerals  1 tablet Oral Daily  . polyethylene glycol  17 g Oral Daily  .  saccharomyces boulardii  250 mg Oral BID    Assessment: Patient is a 8 yof that presents to the ED with N/V/D. The patient was found to be septic and started on broad spectrum antibiotics. The patient takes warfarin PTA for afib. Pharmacy has been asked to continue dosing warfarin while inpatient.   Warfarin PTA dose: 5mg  on Monday and 2.5mg  all other days. Last dose PTA was 8/29 2.5 mg.  Warfarin was held on 8/30 and 8/31 due to supratherapeutic INRs from infection, resumed dosing on 9/1.   EP discussed anticoagulation with patient/daughter and they would like to switch to apixaban. It is recommended apixaban be started once INR drops below 2.  INR this AM down to 1.9.  Okay to begin Eliquis today.  No dose adjustments warranted since Scr WNL and wt > 60 kg.    Goal of Therapy:  Monitor platelets by anticoagulation protocol: Yes   Plan:  Start Eliquis 5 mg BID today. Education with patient was completed yesterday.  [place] Crane, Roylene Reason, BCCP Clinical Pharmacist  08/06/2020 10:39 AM   [facility] pharmacy phone numbers are listed on [email or web address]

## 2020-08-06 NOTE — Progress Notes (Signed)
Inpatient Rehab Admissions Coordinator:  Saw pt at bedside.  Informed pt that there are no beds available today or this weekend for CIR admission.  Pt acknowledged understanding.  Will continue to follow pt.  Gayland Curry, [place], [place] Admissions Coordinator [phone]

## 2020-08-06 NOTE — Progress Notes (Signed)
PROGRESS NOTE    [name] [name] [facility]  PJK:[id number] DOB: [birth year] DOA: 07/25/2020 PCP: Hoyt Koch, MD   Chief Complaint  Patient presents with  . Leg Pain    Brief Narrative: AS PER hpi/chart:86 year old Caucasian female with a past medical history of permanent atrial fibrillation on warfarin, chronic diastolic CHF, essential hypertension, hyperlipidemia and chronic anxiety who comes in with left leg pain nausea vomiting and diarrhea.  Met SIRS criteria and noted to have left lower extremity cellulitis.  Started on empiric IV antibiotics.  TRH asked to admit for management of sepsis secondary to left lower extremity cellulitis.  Hospital course complicated by A. fib with RVR refractory to oral and IV beta-blocker.  Received a bolus of Cardizem infusion and was started on Cardizem drip.  Cardiology consulted on 07/29/2020 to assist with the management.  Heart rate improved with addition of Cardizem drip, switched to p.o. Cardizem on 2020-07-31. Infectious disease consulted at patient's daughter request, assisted with the management, has signed off. Patient also had mild transaminitis, abdominal ultrasound revealing suspicion of cirrhosis, no other acute finding seen by GI and LFTs improving.  Subjective: Daughter at the bedside.  No new complaints.  Some slight increase in the pain and redness and left lower extremity mostly in the posterior side, does have swelling. Rt leg no edema skin wrinkling and  Dry. Telemetry reviewed with EP cardiology team- no acute finding intermittent tachycardia with A. fib but overall stable  Assessment & Plan:  Severe Sepsis secondary to LLE cellulitis/strep group C bacteremia and left lower lobe community-acquired pneumonia:Sepsis POA with lactic acidosis tachycardia fever lower extremity cellulitis as a source. Has had no DVT  On duplex exam. Initially on vancomycin cefepime and Flagyl blood culture 8/29+ with Streptococcus group C... Sepsis resolved,  vital signs stable.  Leukocytosis has resolved- 10.3K. On Amoxicillin and continue through 08/07/2020 as per Dr Linus Salmons, cont lasix po.  If patient continues to complain of pain swelling tenderness on the left foot she may need imaging of the leg +/-further continuation of antibiotics.  Left lower lobe community-acquired pneumonia POA procalcitonin peaked to 1.3 and normalized to 0.2, 9/8.  On amoxicillin and completing course soon.  Continue ambulation and rehabilitation pulmonary toileting.   Cough after drinking water,seen by speech recommended regular continue to diet and thin liquids.  Mild transaminitis: unclear etiology, ultrasound with chronic common bile duct dilatation, acute virus panel negative.Seen by GI.LFTs are nicely improving , can repeat in 2-3 days   Permanent A. fib with RVR.  Heart rate well controlled intermittently in 110s but more or less is stable.  Continue  on current Cardizem, Lopressor and changed to Eliquis from coumadin per EP per daughter request.Echo 07/29/20-EF 60 to 60%, TSH normal.   Supratherapeutic INR resolved.  Per daughter request changed her Coumadin to Eliquis.   Acute hypoxic respiratory failure secondary pulmonary edema seen on chest x-ray likely from fluid overload.  BNP around 300.  Currently on room air, IV Lasix changed to p.o., continue the same. Encourage ambulation, bronchodilator physiotherapy antitussives.  AKI.resolved  Constipation resolved  Right upper lobe lung nodule need outpatient monitoring, incidental finding.  Hyperlipidemia LDL 59 goal less than 70 statin held due to transaminitis.  Physical deconditioning/debility leg infection sepsis complex comorbidities and advanced age.  Continue PT OT.awaiting on CIR  DVT prophylaxis:  Code Status:   Code Status: Full Code  Family Communication: plan of care discussed with patient at bedside.  Status is: Inpatient Remains inpatient appropriate because  of further need of rehabilitation  with CIR.  Awaiting on bed Dispo:Patient From: Home  Planned Disposition: Inpatient Rehab  Expected discharge date: oncebed available at CIR.    Medically stable for discharge: yes  Nutrition: Diet Order            Diet regular Room service appropriate? Yes; Fluid consistency: Thin  Diet effective now                 Body mass index is 29.35 kg/m. Pressure Ulcer: Pressure Injury 07/27/20 Buttocks Left Stage 2 -  Partial thickness loss of dermis presenting as a shallow open injury with a red, pink wound bed without slough. (Active)  07/27/20 2030  Location: Buttocks  Location Orientation: Left  Staging: Stage 2 -  Partial thickness loss of dermis presenting as a shallow open injury with a red, pink wound bed without slough.  Wound Description (Comments):   Present on Admission: Yes    Consultants:see note  Procedures:see note Microbiology:see note Blood Culture    Component Value Date/Time   SDES BLOOD RIGHT WRIST 07/31/2020 1535   SDES BLOOD RIGHT HAND 07/31/2020 1535   SPECREQUEST  07/31/2020 1535    BOTTLES DRAWN AEROBIC ONLY Blood Culture results may not be optimal due to an inadequate volume of blood received in culture bottles   SPECREQUEST  07/31/2020 1535    BOTTLES DRAWN AEROBIC ONLY Blood Culture results may not be optimal due to an inadequate volume of blood received in culture bottles   CULT  07/31/2020 1535    NO GROWTH 5 DAYS Performed at [facility], [place] [address], [place], [place] [address]    CULT  07/31/2020 1535    NO GROWTH 5 DAYS Performed at [facility], [place] [address], [place], [place] [address]    REPTSTATUS 08/05/2020 FINAL 07/31/2020 1535   REPTSTATUS 08/05/2020 FINAL 07/31/2020 1535    Other culture-see note  Medications: Scheduled Meds: . amoxicillin  500 mg Oral Q8H  . apixaban  5 mg Oral BID  . calcium-vitamin D  1 tablet Oral QHS  . dextromethorphan-guaiFENesin  2 tablet Oral BID  . diltiazem  240 mg Oral Daily  .  docusate sodium  100 mg Oral BID  . feeding supplement (ENSURE ENLIVE)  237 mL Oral BID BM  . furosemide  40 mg Oral Daily  . ipratropium  0.5 mg Nebulization BID  . levalbuterol  0.63 mg Nebulization BID  . metoprolol tartrate  50 mg Oral BID  . mineral oil  1 enema Rectal Once  . multivitamin with minerals  1 tablet Oral Daily  . polyethylene glycol  17 g Oral Daily  . saccharomyces boulardii  250 mg Oral BID   Continuous Infusions:  Antimicrobials: Anti-infectives (From admission, onward)   Start     Dose/Rate Route Frequency Ordered Stop   07/29/20 1400  amoxicillin (AMOXIL) capsule 500 mg        500 mg Oral Every 8 hours 07/29/20 1140     07/28/20 1400  ceFAZolin (ANCEF) IVPB 2g/100 mL premix  Status:  Discontinued        2 g 200 mL/hr over 30 Minutes Intravenous Every 8 hours 07/28/20 1249 07/29/20 1140   07/27/20 0600  cefTRIAXone (ROCEPHIN) 2 g in sodium chloride 0.9 % 100 mL IVPB  Status:  Discontinued        2 g 200 mL/hr over 30 Minutes Intravenous Every 24 hours 07/26/20 0842 07/26/20 1046   07/26/20  1800  cefTRIAXone (ROCEPHIN) 2 g in sodium chloride 0.9 % 100 mL IVPB  Status:  Discontinued        2 g 200 mL/hr over 30 Minutes Intravenous Every 24 hours 07/26/20 1046 07/28/20 1249   07/26/20 1700  vancomycin (VANCOCIN) IVPB 1000 mg/200 mL premix  Status:  Discontinued        1,000 mg 200 mL/hr over 60 Minutes Intravenous Every 24 hours 07/25/20 1553 07/26/20 0842   07/26/20 0600  vancomycin (VANCOREADY) IVPB 750 mg/150 mL  Status:  Discontinued        750 mg 150 mL/hr over 60 Minutes Intravenous Every 12 hours 07/25/20 1551 07/25/20 1553   07/26/20 0430  ceFEPIme (MAXIPIME) 2 g in sodium chloride 0.9 % 100 mL IVPB  Status:  Discontinued        2 g 200 mL/hr over 30 Minutes Intravenous Every 12 hours 07/25/20 1551 07/26/20 0842   07/25/20 1600  vancomycin (VANCOREADY) IVPB 1500 mg/300 mL        1,500 mg 150 mL/hr over 120 Minutes Intravenous  Once 07/25/20 1549  07/25/20 1838   07/25/20 1545  ceFEPIme (MAXIPIME) 2 g in sodium chloride 0.9 % 100 mL IVPB        2 g 200 mL/hr over 30 Minutes Intravenous  Once 07/25/20 1531 07/25/20 1622   07/25/20 1545  metroNIDAZOLE (FLAGYL) IVPB 500 mg        500 mg 100 mL/hr over 60 Minutes Intravenous  Once 07/25/20 1531 07/25/20 1729   07/25/20 1545  vancomycin (VANCOCIN) IVPB 1000 mg/200 mL premix  Status:  Discontinued        1,000 mg 200 mL/hr over 60 Minutes Intravenous  Once 07/25/20 1531 07/25/20 1549     Objective: Vitals: Today's Vitals   08/05/20 2340 08/06/20 0644 08/06/20 0852 08/06/20 1200  BP:  127/77 115/60 (!) 96/55  Pulse:  89 89   Resp:  (!) 22 20   Temp:  97.6 F (36.4 C) 98.2 F (36.8 C) 98.3 F (36.8 C)  TempSrc:  Oral Oral Oral  SpO2:  96% 92%   Weight:      Height:      PainSc: 4        Intake/Output Summary (Last 24 hours) at 08/06/2020 1522 Last data filed at 08/06/2020 0650 Gross per 24 hour  Intake 240 ml  Output 1050 ml  Net -810 ml   Filed Weights   08/01/20 0623 08/02/20 0558 08/04/20 0620  Weight: 79.8 kg 76.4 kg 77.6 kg   Weight change:   Intake/Output from previous day: 09/09 0701 - 09/10 0700 In: 480 [P.O.:480] Out: 1050 [Urine:1050] Intake/Output this shift: No intake/output data recorded.  Examination: General exam: AAO, elderly, frail, not in distress, on room air HEENT:Oral mucosa moist, Ear/Nose WNL grossly, dentition normal. Respiratory system: bilaterally clear,no wheezing or crackles,no use of accessory muscle Cardiovascular system: S1 & S2 +, No JVD,. Gastrointestinal system: Abdomen soft, NT,ND, BS+ Nervous System:Alert, awake, moving extremities and grossly nonfocal Extremities: No edema on RLE, LLE with wound dressing intact , erythema edema and tenderness present on the left foot.   Skin: No rashes,no icterus. MSK: Normal muscle bulk,tone, power    Data Reviewed: I have personally reviewed following labs and imaging  studies CBC: Recent Labs  Lab 08/01/20 0835 08/02/20 0331 08/03/20 0532 08/04/20 0613 08/05/20 0720  WBC 16.1* 14.3* 13.2* 11.9* 10.3  NEUTROABS 13.2* 11.8* 10.4* 9.1* 7.5  HGB 12.3 12.0 12.5 12.6 11.4*  HCT  36.8 36.6 38.3 38.5 35.5*  MCV 91.5 90.4 92.5 92.3 91.7  PLT 393 429* 481* 490* 740*   Basic Metabolic Panel: Recent Labs  Lab 08/02/20 0331 08/03/20 0532 08/04/20 0613 08/05/20 0720 08/06/20 0415  NA 132* 135 132* 136 133*  K 4.1 4.1 4.4 3.9 4.1  CL 98 101 98 101 101  CO2 _0 GLUCOSE 162* 143* 131* 142* 127*  BUN _1 CREATININE 0.49 0.44 0.48 0.43* 0.43*  CALCIUM 8.6* 8.9 9.0 8.8* 9.0  MG  --   --  2.2  --   --    GFR: Estimated Creatinine Clearance: 50.9 mL/min (A) (by C-G formula based on SCr of 0.43 mg/dL (L)). Liver Function Tests: Recent Labs  Lab 08/01/20 0835 08/02/20 0331 08/03/20 0532 08/04/20 0613 08/05/20 0720  AST 112* 86* 67* 54* 48*  ALT 77* 66* 59* 52* 49*  ALKPHOS 188* 195* 178* 156* 140*  BILITOT 1.5* 0.8 1.3* 1.4* 1.1  PROT 5.8* 5.8* 6.1* 6.4* 6.2*  ALBUMIN 1.6* 1.5* 1.7* 1.8* 1.7*   Recent Labs  Lab 07/31/20 0717  LIPASE 78*   No results for input(s): AMMONIA in the last 168 hours. Coagulation Profile: Recent Labs  Lab 08/02/20 0331 08/03/20 0532 08/04/20 8144 08/05/20 0720 08/06/20 0937  INR 2.6* 2.4* 2.4* 2.5* 1.9*   Cardiac Enzymes: No results for input(s): CKTOTAL, CKMB, CKMBINDEX, TROPONINI in the last 168 hours. BNP (last 3 results) No results for input(s): PROBNP in the last 8760 hours. HbA1C: No results for input(s): HGBA1C in the last 72 hours. CBG: No results for input(s): GLUCAP in the last 168 hours. Lipid Profile: No results for input(s): CHOL, HDL, LDLCALC, TRIG, CHOLHDL, LDLDIRECT in the last 72 hours. Thyroid Function Tests: No results for input(s): TSH, T4TOTAL, FREET4, T3FREE, THYROIDAB in the last 72 hours. Anemia Panel: No results for input(s): VITAMINB12, FOLATE,  FERRITIN, TIBC, IRON, RETICCTPCT in the last 72 hours. Sepsis Labs: Recent Labs  Lab 07/31/20 0717 07/31/20 1535 08/01/20 0835 08/02/20 0331 08/03/20 0532 08/04/20 0613  PROCALCITON   < >  --  0.82 0.61 0.36 0.22  LATICACIDVEN  --  1.7  --   --   --   --    < > = values in this interval not displayed.    Recent Results (from the past 240 hour(s))  MRSA PCR Screening     Status: None   Collection Time: 07/28/20  4:32 PM   Specimen: Nasal Mucosa; Nasopharyngeal  Result Value Ref Range Status   MRSA by PCR NEGATIVE NEGATIVE Final    Comment:        The GeneXpert MRSA Assay (FDA approved for NASAL specimens only), is one component of a comprehensive MRSA colonization surveillance program. It is not intended to diagnose MRSA infection nor to guide or monitor treatment for MRSA infections. Performed at [facility], [place] [address], [place], [place] [address]   Culture, blood (routine x 2)     Status: None   Collection Time: 07/31/20  3:35 PM   Specimen: BLOOD RIGHT WRIST  Result Value Ref Range Status   Specimen Description BLOOD RIGHT WRIST  Final   Special Requests   Final    BOTTLES DRAWN AEROBIC ONLY Blood Culture results may not be optimal due to an inadequate volume of blood received in culture bottles   Culture   Final    NO GROWTH 5 DAYS Performed at [facility],  [address], [place], [place] [address]    Report Status 08/05/2020 FINAL  Final  Culture, blood (routine x 2)     Status: None   Collection Time: 07/31/20  3:35 PM   Specimen: BLOOD RIGHT HAND  Result Value Ref Range Status   Specimen Description BLOOD RIGHT HAND  Final   Special Requests   Final    BOTTLES DRAWN AEROBIC ONLY Blood Culture results may not be optimal due to an inadequate volume of blood received in culture bottles   Culture   Final    NO GROWTH 5 DAYS Performed at [facility], [place] [address] [place], [place] [address]    Report Status 08/05/2020 FINAL  Final      Radiology Studies: No results found.   LOS: 12 days   Antonieta Pert, MD [facility]  08/06/2020, 3:22 PM

## 2020-08-06 NOTE — Progress Notes (Signed)
Physical Therapy Treatment Patient Details Name: [name] [name] MRN: [id number] DOB: Jan 19, 1931 Today's Date: 08/06/2020    History of Present Illness 86 year old female with a past medical history of atrial fibrillation on warfarin, chronic diastolic CHF, essential hypertension, hyperlipidemia and anxiety who comes in with left leg pain nausea vomiting and diarrhea.  She was found to be febrile.  Found to be tachycardic.  Noted to have erythema and swelling of the left flank.  Admitted for management of sepsis secondary to left lower extremity cellulitis.    PT Comments    At the request of CIR, pt seen again today for treatment. Pt sitting up in bed on entry, agreeable to work with therapy. Pt educated on need to be able to tolerate 3 hours of therapy at CIR. Pt voices understanding and explains that she has been so painful but that she is feeling better today. Pt is limited in safe mobility by anxiety related to increase in pain with mobility, in addition to generalized deconditioning from the last 12 days in bed. Pt is educated on goals of session today to utilize Stedy to stand for transfer to chair. Pt with improved bed mobility today as she is able to move L LE without assist for the first time and only requires modAx2 for coming to EoB. At [facility] pt is able to self steady and with cuing able to place L toes on floor. In an attempt to reduce anxiety, use of Stedy demonstrated to patient prior to use. Pt verbalizes understanding. With increased multimodal cuing pt attempts to come to standing in [place] but is unable to achieve fully upright. Pt HR increases to 150s and pt cued for sitting back on bed. Pt requires 2 minutes for HR to recover to 100s. Pt provide with visual demonstration of technique to come to standing. On second attempt, pt able to achieve upright so Stedy pads could be placed. Pt with HR elevation to 140s. Pt asked her pain level in standing and reports 5/10 pain in L LE. Stedy moved  to in front of recliner and with modA pt able to sit up in recliner. Pt obviously excited with success of session, but also visibly fatigued.   PT educated pt that given the fact that pt HR became elevated with short bouts of activity and her obvious fatigue with getting to chair that the rate and intensity of rehabilitation at a SNF facility would be a safer albeit slower path to achieving her goals of walking and return to independence. Pt verbalized understanding and requested PT call and inform her children. PT called son Fritz Pickerel, who requested daughter Kathlee Nations be called. PT informed daughter of change in recommendation based on pt's physiological response to increased activity. PT currently recommending SNF level rehab with goal of return to ambulation and independence.     Follow Up Recommendations  SNF     Equipment Recommendations  None recommended by PT       Precautions / Restrictions Precautions Precautions: Fall Precaution Comments: L LE pain Restrictions Weight Bearing Restrictions: No    Mobility  Bed Mobility Overal bed mobility: Needs Assistance Bed Mobility: Supine to Sit     Supine to sit: Mod assist;HOB elevated;+2 for physical assistance     General bed mobility comments: modAx2 and maximal single step cuing for movement of L LE across and off side of bed, pt able to reach across body to use bed rail to bring trunk to EOB, modA required for bringing trunk to  upright and for pad scoot of hips to square at [place], pt provided cuing for flexion of L knee to place toes on ground.   Transfers Overall transfer level: Needs assistance   Transfers: Sit to/from Stand Sit to Stand: +2 physical assistance;Mod assist;Min assist;From elevated surface         General transfer comment: Pt provided with visual demonstration of technique prior to starting transfer to chair with use of Stedy. Pt provided maximal encouragement and single step cuing to place feet on base of Stedy, and to  reach up to bar to maximize UE use to come to standing. Pt provided with max A x2 and pt not able to rotate pelvis to come to fully upright. Pt with increase in HR and cued for sitting back on bed. During seated rest break pt provided with addition visualization of anterior lean and pelvic rotation needed for coming to fully upright. After approximately 2 minutes HR recovered. Pt provided maximal multimodal cues and was able to achieve fully upright so that Stedy pads could be placed. Pt HR again elevated. Utilized Stedy to move to recliner, pt requires min A to stand from [employer] and modA for controlled descent into recliner. Pt obviously pleased with accomplishment.    Ambulation/Gait             General Gait Details: unable        Balance Overall balance assessment: Needs assistance Sitting-balance support: Feet supported;Bilateral upper extremity supported;Single extremity supported Sitting balance-Leahy Scale: Fair Sitting balance - Comments: able to static sit with at least single UE support.        Standing balance comment: unable                            Cognition Arousal/Alertness: Awake/alert Behavior During Therapy: WFL for tasks assessed/performed Overall Cognitive Status: Within Functional Limits for tasks assessed                                 General Comments: Pt provided increased time and cuing to reduce anxiety about moving L LE          General Comments General comments (skin integrity, edema, etc.): Pt on RA with SaO2 > 90%O2 throughout session, at rest pt HR 88bpm, with 1st attempt to stand HR increased to 152 bpm, pt requires 2 minute seated rest break to recover to low 100 bpm, on 2nd attempt to stand pt HR increased to 146 bpm. recovering to 100s within 1 minute.       Pertinent Vitals/Pain Pain Assessment: 0-10 Pain Score: 5  Pain Location: L LE with TTWB while coming into standing Pain Descriptors / Indicators:  Grimacing;Guarding;Moaning Pain Intervention(s): Limited activity within patient's tolerance;Monitored during session;Repositioned           PT Goals (current goals can now be found in the care plan section) Acute Rehab PT Goals Patient Stated Goal: manage pain PT Goal Formulation: With patient/family Time For Goal Achievement: 08/10/20 Potential to Achieve Goals: Good Progress towards PT goals: Progressing toward goals    Frequency    Min 2X/week      PT Plan Discharge plan needs to be updated;Frequency needs to be updated       AM-PAC PT "6 Clicks" Mobility   Outcome Measure  Help needed turning from your back to your side while in a flat bed without using  bedrails?: A Lot Help needed moving from lying on your back to sitting on the side of a flat bed without using bedrails?: A Lot Help needed moving to and from a bed to a chair (including a wheelchair)?: A Lot Help needed standing up from a chair using your arms (e.g., wheelchair or bedside chair)?: A Lot Help needed to walk in hospital room?: Total Help needed climbing 3-5 steps with a railing? : Total 6 Click Score: 10    End of Session Equipment Utilized During Treatment: Gait belt Activity Tolerance: Treatment limited secondary to medical complications (Comment);Patient tolerated treatment well (tachycardia in 150s) Patient left: with call bell/phone within reach;in chair;with chair alarm set Nurse Communication: Mobility status PT Visit Diagnosis: Muscle weakness (generalized) (M62.81);Pain Pain - Right/Left: Left Pain - part of body: Leg;Knee;Ankle and joints of foot     Time: 1515-1536 PT Time Calculation (min) (ACUTE ONLY): 21 min  Charges:  $Therapeutic Activity: 8-22 mins                     Noell Lorensen B. Migdalia Dk PT, DPT Acute Rehabilitation Services Pager [phone] Office [phone]    [place] 08/06/2020, 5:44 PM

## 2020-08-07 LAB — BASIC METABOLIC PANEL
Anion gap: 9 (ref 5–15)
BUN: 15 mg/dL (ref 8–23)
CO2: 25 mmol/L (ref 22–32)
Calcium: 9.1 mg/dL (ref 8.9–10.3)
Chloride: 102 mmol/L (ref 98–111)
Creatinine, Ser: 0.55 mg/dL (ref 0.44–1.00)
GFR calc Af Amer: >60 mL/min (ref >60)
GFR calc non Af Amer: >60 mL/min (ref >60)
Glucose, Bld: 147 mg/dL — ABNORMAL HIGH (ref 70–99)
Potassium: 4.8 mmol/L (ref 3.5–5.1)
Sodium: 136 mmol/L (ref 135–145)

## 2020-08-07 LAB — CBC WITH DIFFERENTIAL/PLATELET
Abs Immature Granulocytes: 0.11 10*3/uL — ABNORMAL HIGH (ref 0.00–0.07)
Basophils Absolute: 0.1 10*3/uL (ref 0.0–0.1)
Basophils Relative: 1 %
Eosinophils Absolute: 0.2 10*3/uL (ref 0.0–0.5)
Eosinophils Relative: 1 %
HCT: 35.4 % — ABNORMAL LOW (ref 36.0–46.0)
Hemoglobin: 11.5 g/dL — ABNORMAL LOW (ref 12.0–15.0)
Immature Granulocytes: 1 %
Lymphocytes Relative: 20 %
Lymphs Abs: 2.1 10*3/uL (ref 0.7–4.0)
MCH: 30.6 pg (ref 26.0–34.0)
MCHC: 32.5 g/dL (ref 30.0–36.0)
MCV: 94.1 fL (ref 80.0–100.0)
Monocytes Absolute: 0.8 10*3/uL (ref 0.1–1.0)
Monocytes Relative: 8 %
Neutro Abs: 7.3 10*3/uL (ref 1.7–7.7)
Neutrophils Relative %: 69 %
Platelets: 470 10*3/uL — ABNORMAL HIGH (ref 150–400)
RBC: 3.76 MIL/uL — ABNORMAL LOW (ref 3.87–5.11)
RDW: 14.2 % (ref 11.5–15.5)
WBC: 10.6 10*3/uL — ABNORMAL HIGH (ref 4.0–10.5)
nRBC: 0 % (ref 0.0–0.2)

## 2020-08-07 LAB — PROTIME-INR
INR: 2.1 — ABNORMAL HIGH (ref 0.8–1.2)
Prothrombin Time: 22.5 seconds — ABNORMAL HIGH (ref 11.4–15.2)

## 2020-08-07 MED ORDER — NYSTATIN 500000 UNITS PO TABS
500000.0000 [IU] | ORAL_TABLET | Freq: Three times a day (TID) | ORAL | Status: DC
  Filled 2020-08-07 (×2): qty 1

## 2020-08-07 MED ORDER — NYSTATIN 100000 UNIT/ML MT SUSP
5.0000 mL | Freq: Four times a day (QID) | OROMUCOSAL | Status: DC
  Administered 2020-08-07 – 2020-08-08 (×6): 500000 [IU] via ORAL
  Filled 2020-08-07 (×6): qty 5

## 2020-08-07 NOTE — Progress Notes (Signed)
CSW spoke with daughter Kathlee Nations, stated plan is for CIR, not SNF, stated second assessment by PT/Dr was much better.

## 2020-08-07 NOTE — Progress Notes (Signed)
PROGRESS NOTE    [name] [name] [facility]  QQP:[id number] DOB: 05-29-1931 DOA: 07/25/2020 PCP: Hoyt Koch, MD   Chief Complaint  Patient presents with  . Leg Pain    Brief Narrative: AS PER hpi/chart:86 year old Caucasian female with a past medical history of permanent atrial fibrillation on warfarin, chronic diastolic CHF, essential hypertension, hyperlipidemia and chronic anxiety who comes in with left leg pain nausea vomiting and diarrhea.  Met SIRS criteria and noted to have left lower extremity cellulitis.  Started on empiric IV antibiotics.  TRH asked to admit for management of sepsis secondary to left lower extremity cellulitis.  Hospital course complicated by A. fib with RVR refractory to oral and IV beta-blocker.  Received a bolus of Cardizem infusion and was started on Cardizem drip.  Cardiology consulted on 07/29/2020 to assist with the management.  Heart rate improved with addition of Cardizem drip, switched to p.o. Cardizem on 2020-07-31. Infectious disease consulted at patient's daughter request, assisted with the management, has signed off. Patient also had mild transaminitis, abdominal ultrasound revealing suspicion of cirrhosis, no other acute finding seen by GI and LFTs improving.  Subjective:  Seen this morning patient on the bedside chair having her meal.  Granddaughter at the bedside today.  Her daughter is on the phone.   Patient reports pain is better today she was able to tolerate a standing on the left leg.   Rt leg wrinkling present, dressing change on the left leg this morning Telemetry shows heart rate went in 150s with PT yesterday this morning briefly in 120s.  Assessment & Plan:  Severe Sepsis secondary to LLE cellulitis/strep group C bacteremia and left lower lobe community-acquired pneumonia:Sepsis POA with lactic acidosis tachycardia fever lower extremity cellulitis as a source. Has had no DVT  On duplex exam. Initially on vancomycin cefepime and Flagyl blood  culture 8/29+ with Streptococcus group C.  Hemodynamically stable, leukocytosis resolved.  Sepsis improved. Cont Amoxycillin for few more days given she still has redness and swelling on the left lower extremity > Cont local wound care. cont lasix po.  Left lower lobe community-acquired pneumonia POA procalcitonin peaked to 1.3 and normalized to 0.2, 9/8.  Overall improved.  Continue pulmonary toilet and ambulation.    Cough after drinking water,seen by speech recommended regular continue to diet and thin liquids.  Mild transaminitis: unclear etiology, ultrasound with chronic common bile duct dilatation, acute virus panel negative.Seen by GI.LFTs are nicely improving , can repeat in 2-3 days   Permanent A. fib with RVR.  Heart rate intermittently going up with PT in 150s yesterday.  On Cardizem 240 mg  And lopressor 50 mg bid, if continues to have similar episode will increase her meds.On my exam HR in 90-105 stable.   Cont  Eliquis, changed from coumadin.Echo 07/29/20-EF 60 to 60%, TSH normal.   Supratherapeutic INR resolved.  Now on Eliquis.  Acute hypoxic respiratory failure secondary pulmonary edema seen on chest x-ray likely from fluid overload.  BNP around 300.  Resolved doing well on room air.  Continue p.o. Lasix encourage ambulation.  AKI.resolved  Constipation resolved  Right upper lobe lung nodule need outpatient monitoring, incidental finding.  Hyperlipidemia LDL 59 goal less than 70 statin held due to transaminitis.  Physical deconditioning/debility leg infection sepsis complex comorbidities and advanced age.  Continue physical therapy occupational therapy, CIR.  DVT prophylaxis:  Code Status:   Code Status: Full Code  Family Communication: plan of care discussed with patient at bedside.  Status is: Inpatient  Remains inpatient appropriate because of further need of rehabilitation with CIR.  Awaiting on bed Dispo:Patient From: Home  Planned Disposition: Inpatient  Rehab  Expected discharge date: oncebed available at CIR.    Medically stable for discharge: yes  Nutrition: Diet Order            Diet regular Room service appropriate? Yes; Fluid consistency: Thin  Diet effective now                 Body mass index is 29.35 kg/m. Pressure Ulcer: Pressure Injury 07/27/20 Buttocks Left Stage 2 -  Partial thickness loss of dermis presenting as a shallow open injury with a red, pink wound bed without slough. (Active)  07/27/20 2030  Location: Buttocks  Location Orientation: Left  Staging: Stage 2 -  Partial thickness loss of dermis presenting as a shallow open injury with a red, pink wound bed without slough.  Wound Description (Comments):   Present on Admission: Yes    Consultants:see note  Procedures:see note Microbiology:see note Blood Culture    Component Value Date/Time   SDES BLOOD RIGHT WRIST 07/31/2020 1535   SDES BLOOD RIGHT HAND 07/31/2020 1535   SPECREQUEST  07/31/2020 1535    BOTTLES DRAWN AEROBIC ONLY Blood Culture results may not be optimal due to an inadequate volume of blood received in culture bottles   SPECREQUEST  07/31/2020 1535    BOTTLES DRAWN AEROBIC ONLY Blood Culture results may not be optimal due to an inadequate volume of blood received in culture bottles   CULT  07/31/2020 1535    NO GROWTH 5 DAYS Performed at [facility], [place] [address], [place], [place] [address]    CULT  07/31/2020 1535    NO GROWTH 5 DAYS Performed at [facility], [place] [address] [place], [place] [address]    REPTSTATUS 08/05/2020 FINAL 07/31/2020 1535   REPTSTATUS 08/05/2020 FINAL 07/31/2020 1535    Other culture-see note  Medications: Scheduled Meds: . amoxicillin  500 mg Oral Q8H  . apixaban  5 mg Oral BID  . calcium-vitamin D  1 tablet Oral QHS  . dextromethorphan-guaiFENesin  2 tablet Oral BID  . diltiazem  240 mg Oral Daily  . docusate sodium  100 mg Oral BID  . feeding supplement (ENSURE ENLIVE)  237 mL Oral  BID BM  . furosemide  40 mg Oral Daily  . ipratropium  0.5 mg Nebulization BID  . levalbuterol  0.63 mg Nebulization BID  . metoprolol tartrate  50 mg Oral BID  . mineral oil  1 enema Rectal Once  . multivitamin with minerals  1 tablet Oral Daily  . polyethylene glycol  17 g Oral Daily  . saccharomyces boulardii  250 mg Oral BID   Continuous Infusions:  Antimicrobials: Anti-infectives (From admission, onward)   Start     Dose/Rate Route Frequency Ordered Stop   07/29/20 1400  amoxicillin (AMOXIL) capsule 500 mg        500 mg Oral Every 8 hours 07/29/20 1140     07/28/20 1400  ceFAZolin (ANCEF) IVPB 2g/100 mL premix  Status:  Discontinued        2 g 200 mL/hr over 30 Minutes Intravenous Every 8 hours 07/28/20 1249 07/29/20 1140   07/27/20 0600  cefTRIAXone (ROCEPHIN) 2 g in sodium chloride 0.9 % 100 mL IVPB  Status:  Discontinued        2 g 200 mL/hr over 30 Minutes Intravenous Every 24 hours 07/26/20 0842 07/26/20  1046   07/26/20 1800  cefTRIAXone (ROCEPHIN) 2 g in sodium chloride 0.9 % 100 mL IVPB  Status:  Discontinued        2 g 200 mL/hr over 30 Minutes Intravenous Every 24 hours 07/26/20 1046 07/28/20 1249   07/26/20 1700  vancomycin (VANCOCIN) IVPB 1000 mg/200 mL premix  Status:  Discontinued        1,000 mg 200 mL/hr over 60 Minutes Intravenous Every 24 hours 07/25/20 1553 07/26/20 0842   07/26/20 0600  vancomycin (VANCOREADY) IVPB 750 mg/150 mL  Status:  Discontinued        750 mg 150 mL/hr over 60 Minutes Intravenous Every 12 hours 07/25/20 1551 07/25/20 1553   07/26/20 0430  ceFEPIme (MAXIPIME) 2 g in sodium chloride 0.9 % 100 mL IVPB  Status:  Discontinued        2 g 200 mL/hr over 30 Minutes Intravenous Every 12 hours 07/25/20 1551 07/26/20 0842   07/25/20 1600  vancomycin (VANCOREADY) IVPB 1500 mg/300 mL        1,500 mg 150 mL/hr over 120 Minutes Intravenous  Once 07/25/20 1549 07/25/20 1838   07/25/20 1545  ceFEPIme (MAXIPIME) 2 g in sodium chloride 0.9 % 100 mL  IVPB        2 g 200 mL/hr over 30 Minutes Intravenous  Once 07/25/20 1531 07/25/20 1622   07/25/20 1545  metroNIDAZOLE (FLAGYL) IVPB 500 mg        500 mg 100 mL/hr over 60 Minutes Intravenous  Once 07/25/20 1531 07/25/20 1729   07/25/20 1545  vancomycin (VANCOCIN) IVPB 1000 mg/200 mL premix  Status:  Discontinued        1,000 mg 200 mL/hr over 60 Minutes Intravenous  Once 07/25/20 1531 07/25/20 1549     Objective: Vitals: Today's Vitals   08/07/20 0443 08/07/20 0800 08/07/20 0942 08/07/20 1008  BP: 132/73  111/74   Pulse: 81  (!) 127   Resp: 20     Temp: 97.8 F (36.6 C)     TempSrc: Oral     SpO2: 95%     Weight:      Height:      PainSc:  0-No pain  8     Intake/Output Summary (Last 24 hours) at 08/07/2020 1454 Last data filed at 08/06/2020 1709 Gross per 24 hour  Intake --  Output 450 ml  Net -450 ml   Filed Weights   08/01/20 0623 08/02/20 0558 08/04/20 0620  Weight: 79.8 kg 76.4 kg 77.6 kg   Weight change:   Intake/Output from previous day: 09/10 0701 - 09/11 0700 In: 360 [P.O.:360] Out: 450 [Urine:450] Intake/Output this shift: No intake/output data recorded.  Examination: General exam: AAOx3, elderly, pleasant on room air. HEENT:Oral mucosa moist, Ear/Nose WNL grossly, dentition normal. Respiratory system: bilaterally diminished at base, mostly clear in upper lungs,no wheezing or crackles,no use of accessory muscle Cardiovascular system: S1 & S2 +, No JVD,. Gastrointestinal system: Abdomen soft, NT,ND, BS+ Nervous System:Alert, awake, moving extremities and grossly nonfocal Extremities: No edema on RLE, LLE w/ foot/heel erythema mild tenderness/edema and dressing intact in left lower extremity. Skin: No rashes,no icterus. MSK: Normal muscle bulk,tone, power  Data Reviewed: I have personally reviewed following labs and imaging studies CBC: Recent Labs  Lab 08/02/20 0331 08/03/20 0532 08/04/20 0613 08/05/20 0720 08/07/20 0441  WBC 14.3* 13.2* 11.9*  10.3 10.6*  NEUTROABS 11.8* 10.4* 9.1* 7.5 7.3  HGB 12.0 12.5 12.6 11.4* 11.5*  HCT 36.6 38.3 38.5 35.5* 35.4*  MCV 90.4 92.5 92.3 91.7 94.1  PLT 429* 481* 490* 485* 341*   Basic Metabolic Panel: Recent Labs  Lab 08/03/20 0532 08/04/20 0613 08/05/20 0720 08/06/20 0415 08/07/20 0441  NA 135 132* 136 133* 136  K 4.1 4.4 3.9 4.1 4.8  CL 101 98 101 101 102  CO2 '24 26 26 25 25  ' GLUCOSE 143* 131* 142* 127* 147*  BUN '12 13 15 17 15  ' CREATININE 0.44 0.48 0.43* 0.43* 0.55  CALCIUM 8.9 9.0 8.8* 9.0 9.1  MG  --  2.2  --   --   --    GFR: Estimated Creatinine Clearance: 50.9 mL/min (by C-G formula based on SCr of 0.55 mg/dL). Liver Function Tests: Recent Labs  Lab 08/01/20 0835 08/02/20 0331 08/03/20 0532 08/04/20 0613 08/05/20 0720  AST 112* 86* 67* 54* 48*  ALT 77* 66* 59* 52* 49*  ALKPHOS 188* 195* 178* 156* 140*  BILITOT 1.5* 0.8 1.3* 1.4* 1.1  PROT 5.8* 5.8* 6.1* 6.4* 6.2*  ALBUMIN 1.6* 1.5* 1.7* 1.8* 1.7*   No results for input(s): LIPASE, AMYLASE in the last 168 hours. No results for input(s): AMMONIA in the last 168 hours. Coagulation Profile: Recent Labs  Lab 08/03/20 0532 08/04/20 9622 08/05/20 0720 08/06/20 0937 08/07/20 0441  INR 2.4* 2.4* 2.5* 1.9* 2.1*   Cardiac Enzymes: No results for input(s): CKTOTAL, CKMB, CKMBINDEX, TROPONINI in the last 168 hours. BNP (last 3 results) No results for input(s): PROBNP in the last 8760 hours. HbA1C: No results for input(s): HGBA1C in the last 72 hours. CBG: No results for input(s): GLUCAP in the last 168 hours. Lipid Profile: No results for input(s): CHOL, HDL, LDLCALC, TRIG, CHOLHDL, LDLDIRECT in the last 72 hours. Thyroid Function Tests: No results for input(s): TSH, T4TOTAL, FREET4, T3FREE, THYROIDAB in the last 72 hours. Anemia Panel: No results for input(s): VITAMINB12, FOLATE, FERRITIN, TIBC, IRON, RETICCTPCT in the last 72 hours. Sepsis Labs: Recent Labs  Lab 07/31/20 1535 08/01/20 0835  08/02/20 0331 08/03/20 0532 08/04/20 0613  PROCALCITON  --  0.82 0.61 0.36 0.22  LATICACIDVEN 1.7  --   --   --   --     Recent Results (from the past 240 hour(s))  MRSA PCR Screening     Status: None   Collection Time: 07/28/20  4:32 PM   Specimen: Nasal Mucosa; Nasopharyngeal  Result Value Ref Range Status   MRSA by PCR NEGATIVE NEGATIVE Final    Comment:        The GeneXpert MRSA Assay (FDA approved for NASAL specimens only), is one component of a comprehensive MRSA colonization surveillance program. It is not intended to diagnose MRSA infection nor to guide or monitor treatment for MRSA infections. Performed at [facility], [place] [address], [place], [place] [address]   Culture, blood (routine x 2)     Status: None   Collection Time: 07/31/20  3:35 PM   Specimen: BLOOD RIGHT WRIST  Result Value Ref Range Status   Specimen Description BLOOD RIGHT WRIST  Final   Special Requests   Final    BOTTLES DRAWN AEROBIC ONLY Blood Culture results may not be optimal due to an inadequate volume of blood received in culture bottles   Culture   Final    NO GROWTH 5 DAYS Performed at [facility], [place] [address], [place], [place] [address]    Report Status 08/05/2020 FINAL  Final  Culture, blood (routine x 2)     Status: None  Collection Time: 07/31/20  3:35 PM   Specimen: BLOOD RIGHT HAND  Result Value Ref Range Status   Specimen Description BLOOD RIGHT HAND  Final   Special Requests   Final    BOTTLES DRAWN AEROBIC ONLY Blood Culture results may not be optimal due to an inadequate volume of blood received in culture bottles   Culture   Final    NO GROWTH 5 DAYS Performed at [facility], [place] [address], [place], [place] [address]    Report Status 08/05/2020 FINAL  Final     Radiology Studies: No results found.   LOS: 13 days   Antonieta Pert, MD [facility]  08/07/2020, 2:54 PM

## 2020-08-07 NOTE — Progress Notes (Addendum)
Physical Therapy Treatment Patient Details Name: [name] [name] MRN: [id number] DOB: [birth year] Today's Date: 08/07/2020    History of Present Illness 86 year old female with a past medical history of atrial fibrillation on warfarin, chronic diastolic CHF, essential hypertension, hyperlipidemia and anxiety who comes in with left leg pain nausea vomiting and diarrhea.  She was found to be febrile.  Found to be tachycardic.  Noted to have erythema and swelling of the left flank.  Admitted for management of sepsis secondary to left lower extremity cellulitis.    PT Comments    Pt is making slow but steady progress with mobility. Pt able to participate in entire session with me but with her HR 150-180 with activity. Frequent rest breaks to allow HR to come down. Pt hadn't had her cardizem yet this AM but her rated was also high yesterday when she had had it. I think CIR should be considered if her therapy sessions could be spread out during the day and her HR better controlled. Expect pt will need assist at home even after CIR.   Follow Up Recommendations  CIR (if her sessions can be spread out and HR better controlled)     Equipment Recommendations  None recommended by PT    Recommendations for Other Services       Precautions / Restrictions Precautions Precautions: Fall Precaution Comments: L LE pain    Mobility  Bed Mobility Overal bed mobility: Needs Assistance Bed Mobility: Supine to Sit     Supine to sit: Mod assist;+2 for physical assistance;HOB elevated     General bed mobility comments: Pt able to move LE's to side of bed. Assist to elevate trunk into sitting and bring hips to EOB. Pt holding LLE out with knee extension and verbal cues to let knee flex and bring foot to the ground.   Transfers Overall transfer level: Needs assistance Equipment used: Rolling walker (2 wheeled);Ambulation equipment used Transfers: Sit to/from Stand Sit to Stand: +2 physical assistance;Mod  assist;From elevated surface;Max assist         General transfer comment: With Stedy assist pt to stand from elevated bed with +2 max assist and use of bed pad under hips. Pt with posterior lean and diffculty bringing hips and trunk into extension. Used Stedy to go from bed to chair. +2 mod assist to rise from elevated seat of Stedy. Once in chair used walker and +2 max assist with use of pad to bring hips up.   Ambulation/Gait             General Gait Details: unable    Stairs             Wheelchair Mobility    Modified Rankin (Stroke Patients Only)       Balance Overall balance assessment: Needs assistance Sitting-balance support: Feet supported;Bilateral upper extremity supported Sitting balance-Leahy Scale: Poor Sitting balance - Comments: needed UE support   Standing balance support: Bilateral upper extremity supported Standing balance-Leahy Scale: Zero Standing balance comment: Pt stood with Stedy x 2 and with walker x 2 for 20-30 sec each time. Required +2 mod to max assist to maintain standing. Pt with posterior lean and attempted to have pt step back with her feet for better stability but pt unable .                            Cognition Arousal/Alertness: Awake/alert Behavior During Therapy: WFL for tasks assessed/performed Overall Cognitive Status:  Within Functional Limits for tasks assessed                                        Exercises      General Comments General comments (skin integrity, edema, etc.): Pt with resting HR 109. HR with standing and other mobility incr to 150-180.       Pertinent Vitals/Pain Pain Assessment: Faces Faces Pain Scale: Hurts little more Pain Location: LLE Pain Descriptors / Indicators: Grimacing;Guarding;Sore Pain Intervention(s): Limited activity within patient's tolerance;RN gave pain meds during session;Repositioned    Home Living                      Prior Function             PT Goals (current goals can now be found in the care plan section) Acute Rehab PT Goals Patient Stated Goal: manage pain Progress towards PT goals: Progressing toward goals;Goals downgraded-see care plan (slowly)    Frequency    Min 3X/week      PT Plan Discharge plan needs to be updated;Frequency needs to be updated    Co-evaluation              AM-PAC PT "6 Clicks" Mobility   Outcome Measure  Help needed turning from your back to your side while in a flat bed without using bedrails?: A Lot Help needed moving from lying on your back to sitting on the side of a flat bed without using bedrails?: A Lot Help needed moving to and from a bed to a chair (including a wheelchair)?: Total Help needed standing up from a chair using your arms (e.g., wheelchair or bedside chair)?: A Lot Help needed to walk in hospital room?: Total Help needed climbing 3-5 steps with a railing? : Total 6 Click Score: 9    End of Session   Activity Tolerance: Patient tolerated treatment well (with rest breaks) Patient left: with call bell/phone within reach;in chair;with chair alarm set Nurse Communication: Mobility status;Need for lift equipment PT Visit Diagnosis: Muscle weakness (generalized) (M62.81);Pain Pain - Right/Left: Left Pain - part of body: Leg;Knee;Ankle and joints of foot     Time: 3428-7681 PT Time Calculation (min) (ACUTE ONLY): 28 min  Charges:  $Therapeutic Activity: 23-37 mins                     [place] Pager [phone] Office [place] 08/07/2020, 10:40 AM

## 2020-08-08 LAB — GLUCOSE, CAPILLARY: Glucose-Capillary: 248 mg/dL — ABNORMAL HIGH (ref 70–99)

## 2020-08-08 NOTE — Progress Notes (Signed)
PROGRESS NOTE    [name]  HDQ:[id number] DOB: 29-Jul-1931 DOA: 07/25/2020 PCP: Hoyt Koch, MD   Chief Complaint  Patient presents with  . Leg Pain    Brief Narrative: AS PER hpi/chart:86 year old Caucasian female with a past medical history of permanent atrial fibrillation on warfarin, chronic diastolic CHF, essential hypertension, hyperlipidemia and chronic anxiety who comes in with left leg pain nausea vomiting and diarrhea.  Met SIRS criteria and noted to have left lower extremity cellulitis.  Started on empiric IV antibiotics.  TRH asked to admit for management of sepsis secondary to left lower extremity cellulitis.  Hospital course complicated by A. fib with RVR refractory to oral and IV beta-blocker.  Received a bolus of Cardizem infusion and was started on Cardizem drip.  Cardiology consulted on 07/29/2020 to assist with the management.  Heart rate improved with addition of Cardizem drip, switched to p.o. Cardizem on 2020-07-31. Infectious disease consulted at patient's daughter request, assisted with the management, has signed off. Patient also had mild transaminitis, abdominal ultrasound revealing suspicion of cirrhosis, no other acute finding seen by GI and LFTs improving.  Subjective:  resting well. No new complaints. reports left leg is not hurting any worse and feels okay. No family at bedside today am but son was there in evening. HR stable  Assessment & Plan:  Severe Sepsis secondary to LLE cellulitis/strep group C bacteremia and left lower lobe community-acquired pneumonia:Sepsis POA with lactic acidosis tachycardia fever lower extremity cellulitis as a source. No DVT in duplex . Improving on amoxycillin po. initially on vancomycin cefepime and Flagyl. Blood culture 8/29+ with Streptococcus group C. Monitor her redness and swelling on the left lower extremity and cont local wound care and dressing, keep elevated.  Left lower lobe community-acquired pneumonia  POA-improved, procalcitonin normalized. Continue pulmonary toilet and ambulation.    Cough after drinking water,seen by speech and doing well on regular diet  Mild transaminitis: unclear etiology, ultrasound with chronic common bile duct dilatation, acute virus panel negative.Seen by GI.LFTs are nicely improving , can repeat in 2 days   Permanent A. fib with RVR.  Rate controlled. Cont on Cardizem 240 mg ,Lopressor 50 mg bid. If needed can go up on dose esp if HR goes high during PT session persistently. Coumadin is now switched to Eliquis..Echo 07/29/20-EF 60 to 60%, TSH normal.   Supratherapeutic INR resolved.  Now on Eliquis.  Acute hypoxic respiratory failure secondary pulmonary edema seen on chest x-ray likely from fluid overload.  BNP around 300.  Doing well on room air currently.  Continue her oral Lasix.   AKI: resolved Constipation resolved  Right upper lobe lung nodule need outpatient monitoring, incidental finding.  Hyperlipidemia LDL 59 goal less than 70 statin held due to transaminitis.  Physical deconditioning/debility leg infection sepsis complex comorbidities and advanced age.  Continue physical therapy occupational therapy. She is pending CIR Bed.  DVT prophylaxis:  Code Status:   Code Status: Full Code  Family Communication: plan of care discussed with patient  Updated son at bedside and daughter on phone on my 2nd visit this evening.  Status is: Inpatient Remains inpatient appropriate because of further need of rehabilitation with CIR.  Awaiting on bed Dispo:Patient From: Home  Planned Disposition: Inpatient Rehab  Expected discharge date: oncebed available at CIR.    Medically stable for discharge: yes Nutrition: Diet Order            Diet regular Room service appropriate? Yes; Fluid consistency: Thin  Diet  effective now                 Body mass index is 29.35 kg/m. Pressure Ulcer: Pressure Injury 07/27/20 Buttocks Left Stage 2 -  Partial thickness loss  of dermis presenting as a shallow open injury with a red, pink wound bed without slough. (Active)  07/27/20 2030  Location: Buttocks  Location Orientation: Left  Staging: Stage 2 -  Partial thickness loss of dermis presenting as a shallow open injury with a red, pink wound bed without slough.  Wound Description (Comments):   Present on Admission: Yes   Consultants:see note  Procedures:see note Microbiology:see note Blood Culture    Component Value Date/Time   SDES BLOOD RIGHT WRIST 07/31/2020 1535   SDES BLOOD RIGHT HAND 07/31/2020 1535   SPECREQUEST  07/31/2020 1535    BOTTLES DRAWN AEROBIC ONLY Blood Culture results may not be optimal due to an inadequate volume of blood received in culture bottles   SPECREQUEST  07/31/2020 1535    BOTTLES DRAWN AEROBIC ONLY Blood Culture results may not be optimal due to an inadequate volume of blood received in culture bottles   CULT  07/31/2020 1535    NO GROWTH 5 DAYS Performed at [facility], [place] [address], [place], [place] [address]    CULT  07/31/2020 1535    NO GROWTH 5 DAYS Performed at [facility], [place] [address], [place], [place] [address]    REPTSTATUS 08/05/2020 FINAL 07/31/2020 1535   REPTSTATUS 08/05/2020 FINAL 07/31/2020 1535    Other culture-see note  Medications: Scheduled Meds: . amoxicillin  500 mg Oral Q8H  . apixaban  5 mg Oral BID  . calcium-vitamin D  1 tablet Oral QHS  . dextromethorphan-guaiFENesin  2 tablet Oral BID  . diltiazem  240 mg Oral Daily  . docusate sodium  100 mg Oral BID  . feeding supplement (ENSURE ENLIVE)  237 mL Oral BID BM  . furosemide  40 mg Oral Daily  . ipratropium  0.5 mg Nebulization BID  . levalbuterol  0.63 mg Nebulization BID  . metoprolol tartrate  50 mg Oral BID  . mineral oil  1 enema Rectal Once  . multivitamin with minerals  1 tablet Oral Daily  . nystatin  5 mL Oral QID  . polyethylene glycol  17 g Oral Daily  . saccharomyces boulardii  250 mg Oral BID    Continuous Infusions:  Antimicrobials: Anti-infectives (From admission, onward)   Start     Dose/Rate Route Frequency Ordered Stop   08/07/20 1515  nystatin (MYCOSTATIN) tablet 500,000 Units  Status:  Discontinued        500,000 Units Oral Every 8 hours 08/07/20 1514 08/07/20 1614   07/29/20 1400  amoxicillin (AMOXIL) capsule 500 mg        500 mg Oral Every 8 hours 07/29/20 1140     07/28/20 1400  ceFAZolin (ANCEF) IVPB 2g/100 mL premix  Status:  Discontinued        2 g 200 mL/hr over 30 Minutes Intravenous Every 8 hours 07/28/20 1249 07/29/20 1140   07/27/20 0600  cefTRIAXone (ROCEPHIN) 2 g in sodium chloride 0.9 % 100 mL IVPB  Status:  Discontinued        2 g 200 mL/hr over 30 Minutes Intravenous Every 24 hours 07/26/20 0842 07/26/20 1046   07/26/20 1800  cefTRIAXone (ROCEPHIN) 2 g in sodium chloride 0.9 % 100 mL IVPB  Status:  Discontinued  2 g 200 mL/hr over 30 Minutes Intravenous Every 24 hours 07/26/20 1046 07/28/20 1249   07/26/20 1700  vancomycin (VANCOCIN) IVPB 1000 mg/200 mL premix  Status:  Discontinued        1,000 mg 200 mL/hr over 60 Minutes Intravenous Every 24 hours 07/25/20 1553 07/26/20 0842   07/26/20 0600  vancomycin (VANCOREADY) IVPB 750 mg/150 mL  Status:  Discontinued        750 mg 150 mL/hr over 60 Minutes Intravenous Every 12 hours 07/25/20 1551 07/25/20 1553   07/26/20 0430  ceFEPIme (MAXIPIME) 2 g in sodium chloride 0.9 % 100 mL IVPB  Status:  Discontinued        2 g 200 mL/hr over 30 Minutes Intravenous Every 12 hours 07/25/20 1551 07/26/20 0842   07/25/20 1600  vancomycin (VANCOREADY) IVPB 1500 mg/300 mL        1,500 mg 150 mL/hr over 120 Minutes Intravenous  Once 07/25/20 1549 07/25/20 1838   07/25/20 1545  ceFEPIme (MAXIPIME) 2 g in sodium chloride 0.9 % 100 mL IVPB        2 g 200 mL/hr over 30 Minutes Intravenous  Once 07/25/20 1531 07/25/20 1622   07/25/20 1545  metroNIDAZOLE (FLAGYL) IVPB 500 mg        500 mg 100 mL/hr over 60 Minutes  Intravenous  Once 07/25/20 1531 07/25/20 1729   07/25/20 1545  vancomycin (VANCOCIN) IVPB 1000 mg/200 mL premix  Status:  Discontinued        1,000 mg 200 mL/hr over 60 Minutes Intravenous  Once 07/25/20 1531 07/25/20 1549     Objective: Vitals: Today's Vitals   08/08/20 0737 08/08/20 1000 08/08/20 1029 08/08/20 1239  BP:   (!) 97/55 (!) 107/55  Pulse:   (!) 118 88  Resp:    18  Temp:    98.7 F (37.1 C)  TempSrc:    Oral  SpO2: 98%   95%  Weight:      Height:      PainSc:  0-No pain      Intake/Output Summary (Last 24 hours) at 08/08/2020 1326 Last data filed at 08/07/2020 2234 Gross per 24 hour  Intake --  Output 800 ml  Net -800 ml   Filed Weights   08/01/20 0623 08/02/20 0558 08/04/20 0620  Weight: 79.8 kg 76.4 kg 77.6 kg   Weight change:   Intake/Output from previous day: 09/11 0701 - 09/12 0700 In: -  Out: 800 [Urine:800] Intake/Output this shift: No intake/output data recorded.  Examination: General exam: AAO, elderly, frail, on room air, NAD, weak appearing. HEENT:Oral mucosa moist, Ear/Nose WNL grossly, dentition normal. Respiratory system: bilaterally clear,no wheezing or crackles,no use of accessory muscle Cardiovascular system: S1 & S2 +, No JVD,. Gastrointestinal system: Abdomen soft, NT,ND, BS+ Nervous System:Alert, awake, moving extremities and grossly nonfocal Extremities:  RLE no edema. LLE with erythema.redness and swelling in left leg. Skin: No rashes,no icterus. MSK: Normal muscle bulk,tone, power  Data Reviewed: I have personally reviewed following labs and imaging studies CBC: Recent Labs  Lab 08/02/20 0331 08/03/20 0532 08/04/20 0613 08/05/20 0720 08/07/20 0441  WBC 14.3* 13.2* 11.9* 10.3 10.6*  NEUTROABS 11.8* 10.4* 9.1* 7.5 7.3  HGB 12.0 12.5 12.6 11.4* 11.5*  HCT 36.6 38.3 38.5 35.5* 35.4*  MCV 90.4 92.5 92.3 91.7 94.1  PLT 429* 481* 490* 485* 295*   Basic Metabolic Panel: Recent Labs  Lab 08/03/20 0532 08/04/20 0613  08/05/20 0720 08/06/20 0415 08/07/20 0441  NA 135 132*  136 133* 136  K 4.1 4.4 3.9 4.1 4.8  CL 101 98 101 101 102  CO2 _0 GLUCOSE 143* 131* 142* 127* 147*  BUN _1 CREATININE 0.44 0.48 0.43* 0.43* 0.55  CALCIUM 8.9 9.0 8.8* 9.0 9.1  MG  --  2.2  --   --   --    GFR: Estimated Creatinine Clearance: 50.9 mL/min (by C-G formula based on SCr of 0.55 mg/dL). Liver Function Tests: Recent Labs  Lab 08/02/20 0331 08/03/20 0532 08/04/20 0613 08/05/20 0720  AST 86* 67* 54* 48*  ALT 66* 59* 52* 49*  ALKPHOS 195* 178* 156* 140*  BILITOT 0.8 1.3* 1.4* 1.1  PROT 5.8* 6.1* 6.4* 6.2*  ALBUMIN 1.5* 1.7* 1.8* 1.7*   No results for input(s): LIPASE, AMYLASE in the last 168 hours. No results for input(s): AMMONIA in the last 168 hours. Coagulation Profile: Recent Labs  Lab 08/03/20 0532 08/04/20 1423 08/05/20 0720 08/06/20 0937 08/07/20 0441  INR 2.4* 2.4* 2.5* 1.9* 2.1*   Cardiac Enzymes: No results for input(s): CKTOTAL, CKMB, CKMBINDEX, TROPONINI in the last 168 hours. BNP (last 3 results) No results for input(s): PROBNP in the last 8760 hours. HbA1C: No results for input(s): HGBA1C in the last 72 hours. CBG: Recent Labs  Lab 08/08/20 1142  GLUCAP 248*   Lipid Profile: No results for input(s): CHOL, HDL, LDLCALC, TRIG, CHOLHDL, LDLDIRECT in the last 72 hours. Thyroid Function Tests: No results for input(s): TSH, T4TOTAL, FREET4, T3FREE, THYROIDAB in the last 72 hours. Anemia Panel: No results for input(s): VITAMINB12, FOLATE, FERRITIN, TIBC, IRON, RETICCTPCT in the last 72 hours. Sepsis Labs: Recent Labs  Lab 08/02/20 0331 08/03/20 0532 08/04/20 0613  PROCALCITON 0.61 0.36 0.22    Recent Results (from the past 240 hour(s))  Culture, blood (routine x 2)     Status: None   Collection Time: 07/31/20  3:35 PM   Specimen: BLOOD RIGHT WRIST  Result Value Ref Range Status   Specimen Description BLOOD RIGHT WRIST  Final   Special Requests    Final    BOTTLES DRAWN AEROBIC ONLY Blood Culture results may not be optimal due to an inadequate volume of blood received in culture bottles   Culture   Final    NO GROWTH 5 DAYS Performed at [facility], [place] [address] [place], [place] [address]    Report Status 08/05/2020 FINAL  Final  Culture, blood (routine x 2)     Status: None   Collection Time: 07/31/20  3:35 PM   Specimen: BLOOD RIGHT HAND  Result Value Ref Range Status   Specimen Description BLOOD RIGHT HAND  Final   Special Requests   Final    BOTTLES DRAWN AEROBIC ONLY Blood Culture results may not be optimal due to an inadequate volume of blood received in culture bottles   Culture   Final    NO GROWTH 5 DAYS Performed at [facility], [place] [address], [place], [place] [address]    Report Status 08/05/2020 FINAL  Final     Radiology Studies: No results found.   LOS: 14 days   Antonieta Pert, MD [facility]  08/08/2020, 1:26 PM

## 2020-08-09 LAB — COMPREHENSIVE METABOLIC PANEL
ALT: 45 U/L — ABNORMAL HIGH (ref 0–44)
AST: 44 U/L — ABNORMAL HIGH (ref 15–41)
Albumin: 2 g/dL — ABNORMAL LOW (ref 3.5–5.0)
Alkaline Phosphatase: 118 U/L (ref 38–126)
Anion gap: 10 (ref 5–15)
BUN: 14 mg/dL (ref 8–23)
CO2: 25 mmol/L (ref 22–32)
Calcium: 9.1 mg/dL (ref 8.9–10.3)
Chloride: 101 mmol/L (ref 98–111)
Creatinine, Ser: 0.52 mg/dL (ref 0.44–1.00)
GFR calc Af Amer: >60 mL/min (ref >60)
GFR calc non Af Amer: >60 mL/min (ref >60)
Glucose, Bld: 174 mg/dL — ABNORMAL HIGH (ref 70–99)
Potassium: 4.2 mmol/L (ref 3.5–5.1)
Sodium: 136 mmol/L (ref 135–145)
Total Bilirubin: 0.9 mg/dL (ref 0.3–1.2)
Total Protein: 6.8 g/dL (ref 6.5–8.1)

## 2020-08-09 LAB — CBC
HCT: 37 % (ref 36.0–46.0)
Hemoglobin: 11.6 g/dL — ABNORMAL LOW (ref 12.0–15.0)
MCH: 29.7 pg (ref 26.0–34.0)
MCHC: 31.4 g/dL (ref 30.0–36.0)
MCV: 94.6 fL (ref 80.0–100.0)
Platelets: 516 10*3/uL — ABNORMAL HIGH (ref 150–400)
RBC: 3.91 MIL/uL (ref 3.87–5.11)
RDW: 14.4 % (ref 11.5–15.5)
WBC: 8.8 10*3/uL (ref 4.0–10.5)
nRBC: 0 % (ref 0.0–0.2)

## 2020-08-09 LAB — GLUCOSE, CAPILLARY: Glucose-Capillary: 121 mg/dL — ABNORMAL HIGH (ref 70–99)

## 2020-08-09 MED ORDER — FLUCONAZOLE 100 MG PO TABS
100.0000 mg | ORAL_TABLET | Freq: Every day | ORAL | Status: DC
  Administered 2020-08-09 – 2020-08-12 (×4): 100 mg via ORAL
  Filled 2020-08-09 (×4): qty 1

## 2020-08-09 MED ORDER — ACETAMINOPHEN 325 MG PO TABS
650.0000 mg | ORAL_TABLET | Freq: Four times a day (QID) | ORAL | Status: DC | PRN
  Administered 2020-08-09 – 2020-08-12 (×8): 650 mg via ORAL
  Filled 2020-08-09 (×8): qty 2

## 2020-08-09 MED ORDER — GLUCERNA SHAKE PO LIQD
237.0000 mL | Freq: Two times a day (BID) | ORAL | Status: DC
  Administered 2020-08-09 – 2020-08-12 (×6): 237 mL via ORAL
  Filled 2020-08-09 (×7): qty 237

## 2020-08-09 MED ORDER — ATORVASTATIN CALCIUM 10 MG PO TABS
20.0000 mg | ORAL_TABLET | Freq: Every day | ORAL | Status: DC
  Administered 2020-08-09 – 2020-08-11 (×3): 20 mg via ORAL
  Filled 2020-08-09 (×3): qty 2

## 2020-08-09 NOTE — Progress Notes (Signed)
Physical Therapy Treatment Patient Details Name: [name] [name] MRN: [id number] DOB: 1931-02-04 Today's Date: 08/09/2020    History of Present Illness Pt is 86 year old female with a past medical history of atrial fibrillation on warfarin, chronic diastolic CHF, essential hypertension, hyperlipidemia and anxiety who comes in with left leg pain nausea vomiting and diarrhea.  She was found to be febrile.  Found to be tachycardic.  Noted to have erythema and swelling of the left flank.  Admitted for management of sepsis secondary to left lower extremity cellulitis.    PT Comments    Pt demonstrating excellent progress today.  Her HR was controlled with activity. She was educated on use of gait belt for L LE AAROM and cued on full controlled ROM. She demonstrated improved transfers with multiple sit to stands and able to weight shift in STED[place].  May be able to progress to stand pivot or small steps next visit.  Pt was eager to participate and improve mobility.  She was very independent prior to admission.    Follow Up Recommendations  CIR     Equipment Recommendations  None recommended by PT    Recommendations for Other Services Rehab consult     Precautions / Restrictions Precautions Precautions: Fall Precaution Comments: L LE pain; watch HR    Mobility  Bed Mobility Overal bed mobility: Needs Assistance Bed Mobility: Supine to Sit Rolling: Mod assist   Supine to sit: Mod assist     General bed mobility comments: Pt able to move LE's to EOB , reached for rail and required PT to provide mod A for trunk  Transfers Overall transfer level: Needs assistance Equipment used: Rolling walker (2 wheeled);Ambulation equipment used Transfers: Sit to/from [facility] Sit to Stand: +2 physical assistance;Mod assist;From elevated surface;Min assist Stand pivot transfers: Total assist ([place])       General transfer comment: Performed sit to stand from elevated bed and from  recliner with mod A x 2; performed sit to stand from STEDY with min A of 2 - did this 8 times with rest breaks; Required assist with gait belt and initial assist/cues to tuck buttock but improved with increased reps.  Ambulation/Gait                 Stairs             Wheelchair Mobility    Modified Rankin (Stroke Patients Only)       Balance Overall balance assessment: Needs assistance Sitting-balance support: Feet supported;Bilateral upper extremity supported;Single extremity supported Sitting balance-Leahy Scale: Poor Sitting balance - Comments: needed UE support   Standing balance support: Bilateral upper extremity supported;Single extremity supported Standing balance-Leahy Scale: Poor Standing balance comment: Pt stood in [place] 8 times with bil UE support.  Once standing she was able to maintain with min A for 10-30 seconds with short bouts of min guard. Able to lift 1 hand from [facility] for 2-3 seconds.  Performed weight shifting and 1 rep of lifting each foot.                            Cognition Arousal/Alertness: Awake/alert Behavior During Therapy: WFL for tasks assessed/performed Overall Cognitive Status: Within Functional Limits for tasks assessed                                 General Comments: daughter present  Exercises General Exercises - Lower Extremity Ankle Circles/Pumps: AROM;Right;AAROM;Left;15 reps Long Arc Quad: AROM;10 reps;Seated;Both Heel Slides: AROM;Right;AAROM;Left;10 reps;Seated Hip ABduction/ADduction: AROM;Right;AAROM;Left;10 reps;Seated    General Comments General comments (skin integrity, edema, etc.): Pt's HR 88-103 bpm throughout session.  Provided with HEP handout and educated on use of gait belt for AAROM      Pertinent Vitals/Pain Pain Assessment: Faces Faces Pain Scale: Hurts little more Pain Location: LLE with movement Pain Descriptors / Indicators: Discomfort;Sore (reports  tolerable) Pain Intervention(s): Limited activity within patient's tolerance;Monitored during session;Repositioned    Home Living                      Prior Function            PT Goals (current goals can now be found in the care plan section) Acute Rehab PT Goals Patient Stated Goal: return to normal function PT Goal Formulation: With patient/family Time For Goal Achievement: 08/10/20 Potential to Achieve Goals: Good Progress towards PT goals: Progressing toward goals    Frequency    Min 3X/week      PT Plan Current plan remains appropriate    Co-evaluation              AM-PAC PT "6 Clicks" Mobility   Outcome Measure  Help needed turning from your back to your side while in a flat bed without using bedrails?: A Lot Help needed moving from lying on your back to sitting on the side of a flat bed without using bedrails?: A Lot Help needed moving to and from a bed to a chair (including a wheelchair)?: Total Help needed standing up from a chair using your arms (e.g., wheelchair or bedside chair)?: A Lot Help needed to walk in hospital room?: Total Help needed climbing 3-5 steps with a railing? : Total 6 Click Score: 9    End of Session Equipment Utilized During Treatment: Gait belt Activity Tolerance: Patient tolerated treatment well Patient left: with call bell/phone within reach;in chair;with chair alarm set Nurse Communication: Mobility status;Need for lift equipment PT Visit Diagnosis: Muscle weakness (generalized) (M62.81);Pain Pain - Right/Left: Left Pain - part of body: Leg;Knee;Ankle and joints of foot     Time: 1430-1515 PT Time Calculation (min) (ACUTE ONLY): 45 min  Charges:  $Therapeutic Exercise: 8-22 mins $Therapeutic Activity: 23-37 mins                     Abran Richard, PT Acute Rehab Services Pager [phone] [facility] [place] 08/09/2020, 3:32 PM

## 2020-08-09 NOTE — Care Management Important Message (Signed)
Important Message  Patient Details  Name: [name] [name] MRN: [id number] Date of Birth: 29-Sep-1931   Medicare Important Message Given:  Yes     Shelda Altes 08/09/2020, 11:42 AM

## 2020-08-09 NOTE — Progress Notes (Signed)
Inpatient Rehab Admissions Coordinator:  Saw pt and daughter, Kathlee Nations at bedside. Informed them that there are no available beds in CIR today or tomorrow.  Still interested in pursuing CIR.  Will continue to follow.    Gayland Curry, [place], [place] Admissions Coordinator [phone]

## 2020-08-09 NOTE — Progress Notes (Addendum)
PROGRESS NOTE    [name]  VZC:[id number] DOB: August 05, 1931 DOA: 07/25/2020 PCP: Hoyt Koch, MD   Chief Complaint  Patient presents with  . Leg Pain    Brief Narrative: AS PER hpi/chart:86 year old Caucasian female with a past medical history of permanent atrial fibrillation on warfarin, chronic diastolic CHF, essential hypertension, hyperlipidemia and chronic anxiety who comes in with left leg pain nausea vomiting and diarrhea.  Met SIRS criteria and noted to have left lower extremity cellulitis.  Started on empiric IV antibiotics.  TRH asked to admit for management of sepsis secondary to left lower extremity cellulitis.  Hospital course complicated by A. fib with RVR refractory to oral and IV beta-blocker.  Received a bolus of Cardizem infusion and was started on Cardizem drip.  Cardiology consulted on 07/29/2020 to assist with the management.  Heart rate improved with addition of Cardizem drip, switched to p.o. Cardizem on 2020-07-31. Infectious disease consulted at patient's daughter request, assisted with the management, has signed off. Patient also had mild transaminitis, abdominal ultrasound revealing suspicion of cirrhosis, no other acute finding seen by Gi. She is followed by PT OT, CIR has been recommended and patient and daughter wants to pursue CIR and waiting for inpatient rehab placement.  Subjective:  No acute events overnight.  Patient reports left leg pain and redness is improving.  Daughter at the bedside with questions.  Daughter requesting to increase her Tylenol to 650 and also resume Lipitor  Assessment & Plan:  Severe Sepsis secondary to LLE cellulitis/strep group C bacteremia and left lower lobe community-acquired pneumonia:Sepsis POA with lactic acidosis tachycardia fever lower extremity cellulitis as a source. No DVT in duplex . Improving on amoxycillin po. initially on vancomycin cefepime and Flagyl. Blood culture 8/29+ with Streptococcus group C.  Left  leg appears much improved still has mild erythema and some tenderness and edema, keep the leg elevated and cont po lasix. Will stop amoxicillin as patient has completed the course and few additional days.  Continue local wound care and dressing daily.  Afebrile and WBC count stable 8.8K.  Left lower lobe community-acquired pneumonia POA-improved, procalcitonin normalized. Continue pulmonary toilet and ambulation.  Continue supportive measures  Cough after drinking water, resolved.  Seen by speech, tolerating regular diet.    Mild transaminitis: ast/alt at 44/45. ? etiology, ultrasound with chronic common bile duct dilatation, acute virus panel negative.Seen by GI. Resuming lipitor and monitor lft  In few days  Permanent A. fib with RVR: Rate is controlled.  No significant rapid burst of A. fib.  Blood pressure somewhat soft in 90s to 100. Cont on Cardizem 240 mg ,Lopressor 50 mg bid.  Monitor heart rate, PT session.  Continue on Eliquis, changed from Coumadin.  Echo EF 60% and unremarkable TSH stable   Supratherapeutic INR resolved.  Now on Eliquis.  Acute hypoxic respiratory failure secondary pulmonary edema seen on chest x-ray likely from fluid overload.  BNP around 300.  On room air.  Continue oral Lasix.  AKI: resolved  Constipation resolved  Right upper lobe lung nodule need outpatient monitoring, incidental finding.  Hyperlipidemia LDL 59 goal less than 70. Resume lipitor as ast/alt in 44-45. lft in few days  Physical deconditioning/debility leg infection sepsis complex comorbidities and advanced age.  PT OT continues to recommends inpatient rehab and awaiting on placement.   Oral thrush- diflucan x 5 days. Off   Antibiotics.   Intermittently high blood sugar hba1c was 6.8, prediabetes-diabetes: We discussed about diet modification- daughter worried  that patient will have  limited option w. Meal but they are watching.  Counseled about  Low carb- and chang ensure to glucerna. Lab  Results  Component Value Date   HGBA1C 6.8 (H) 07/31/2020    DVT prophylaxis:  Code Status:   Code Status: Full Code  Family Communication: plan of care discussed with patient.  Her daughter was present at the bedside today.  Status is: Inpatient Remains inpatient appropriate because of further need of rehabilitation with CIR.  Awaiting on bed Dispo:Patient From: Home  Planned Disposition: Inpatient Rehab  Expected discharge date: once bed available at CIR.    Medically stable for discharge: yes Nutrition: Diet Order            Diet regular Room service appropriate? Yes; Fluid consistency: Thin  Diet effective now                 Body mass index is 29.07 kg/m. Pressure Ulcer: Pressure Injury 07/27/20 Buttocks Left Stage 2 -  Partial thickness loss of dermis presenting as a shallow open injury with a red, pink wound bed without slough. (Active)  07/27/20 2030  Location: Buttocks  Location Orientation: Left  Staging: Stage 2 -  Partial thickness loss of dermis presenting as a shallow open injury with a red, pink wound bed without slough.  Wound Description (Comments):   Present on Admission: Yes   Consultants:see note  Procedures:see note Microbiology:see note Blood Culture    Component Value Date/Time   SDES BLOOD RIGHT WRIST 07/31/2020 1535   SDES BLOOD RIGHT HAND 07/31/2020 1535   SPECREQUEST  07/31/2020 1535    BOTTLES DRAWN AEROBIC ONLY Blood Culture results may not be optimal due to an inadequate volume of blood received in culture bottles   SPECREQUEST  07/31/2020 1535    BOTTLES DRAWN AEROBIC ONLY Blood Culture results may not be optimal due to an inadequate volume of blood received in culture bottles   CULT  07/31/2020 1535    NO GROWTH 5 DAYS Performed at [facility], [place] [address] [place], [place] [address]    CULT  07/31/2020 1535    NO GROWTH 5 DAYS Performed at [facility], [place] [address] [place],  [address]    REPTSTATUS  08/05/2020 FINAL 07/31/2020 1535   REPTSTATUS 08/05/2020 FINAL 07/31/2020 1535    Other culture-see note  Medications: Scheduled Meds: . amoxicillin  500 mg Oral Q8H  . apixaban  5 mg Oral BID  . atorvastatin  20 mg Oral q1800  . calcium-vitamin D  1 tablet Oral QHS  . dextromethorphan-guaiFENesin  2 tablet Oral BID  . diltiazem  240 mg Oral Daily  . docusate sodium  100 mg Oral BID  . feeding supplement (GLUCERNA SHAKE)  237 mL Oral BID BM  . fluconazole  100 mg Oral Daily  . furosemide  40 mg Oral Daily  . ipratropium  0.5 mg Nebulization BID  . levalbuterol  0.63 mg Nebulization BID  . metoprolol tartrate  50 mg Oral BID  . mineral oil  1 enema Rectal Once  . multivitamin with minerals  1 tablet Oral Daily  . polyethylene glycol  17 g Oral Daily  . saccharomyces boulardii  250 mg Oral BID   Continuous Infusions:  Antimicrobials: Anti-infectives (From admission, onward)   Start     Dose/Rate Route Frequency Ordered Stop   08/09/20 1000  fluconazole (DIFLUCAN) tablet 100 mg        100 mg  Oral Daily 08/09/20 0746 08/14/20 0959   08/07/20 1515  nystatin (MYCOSTATIN) tablet 500,000 Units  Status:  Discontinued        500,000 Units Oral Every 8 hours 08/07/20 1514 08/07/20 1614   07/29/20 1400  amoxicillin (AMOXIL) capsule 500 mg        500 mg Oral Every 8 hours 07/29/20 1140     07/28/20 1400  ceFAZolin (ANCEF) IVPB 2g/100 mL premix  Status:  Discontinued        2 g 200 mL/hr over 30 Minutes Intravenous Every 8 hours 07/28/20 1249 07/29/20 1140   07/27/20 0600  cefTRIAXone (ROCEPHIN) 2 g in sodium chloride 0.9 % 100 mL IVPB  Status:  Discontinued        2 g 200 mL/hr over 30 Minutes Intravenous Every 24 hours 07/26/20 0842 07/26/20 1046   07/26/20 1800  cefTRIAXone (ROCEPHIN) 2 g in sodium chloride 0.9 % 100 mL IVPB  Status:  Discontinued        2 g 200 mL/hr over 30 Minutes Intravenous Every 24 hours 07/26/20 1046 07/28/20 1249   07/26/20 1700  vancomycin (VANCOCIN) IVPB  1000 mg/200 mL premix  Status:  Discontinued        1,000 mg 200 mL/hr over 60 Minutes Intravenous Every 24 hours 07/25/20 1553 07/26/20 0842   07/26/20 0600  vancomycin (VANCOREADY) IVPB 750 mg/150 mL  Status:  Discontinued        750 mg 150 mL/hr over 60 Minutes Intravenous Every 12 hours 07/25/20 1551 07/25/20 1553   07/26/20 0430  ceFEPIme (MAXIPIME) 2 g in sodium chloride 0.9 % 100 mL IVPB  Status:  Discontinued        2 g 200 mL/hr over 30 Minutes Intravenous Every 12 hours 07/25/20 1551 07/26/20 0842   07/25/20 1600  vancomycin (VANCOREADY) IVPB 1500 mg/300 mL        1,500 mg 150 mL/hr over 120 Minutes Intravenous  Once 07/25/20 1549 07/25/20 1838   07/25/20 1545  ceFEPIme (MAXIPIME) 2 g in sodium chloride 0.9 % 100 mL IVPB        2 g 200 mL/hr over 30 Minutes Intravenous  Once 07/25/20 1531 07/25/20 1622   07/25/20 1545  metroNIDAZOLE (FLAGYL) IVPB 500 mg        500 mg 100 mL/hr over 60 Minutes Intravenous  Once 07/25/20 1531 07/25/20 1729   07/25/20 1545  vancomycin (VANCOCIN) IVPB 1000 mg/200 mL premix  Status:  Discontinued        1,000 mg 200 mL/hr over 60 Minutes Intravenous  Once 07/25/20 1531 07/25/20 1549     Objective: Vitals: Today's Vitals   08/08/20 2147 08/09/20 0126 08/09/20 0500 08/09/20 0956  BP:      Pulse: (!) 108     Resp:      Temp:      TempSrc:      SpO2:    92%  Weight:   76.8 kg   Height:      PainSc:  Asleep      Intake/Output Summary (Last 24 hours) at 08/09/2020 1337 Last data filed at 08/09/2020 0951 Gross per 24 hour  Intake 360 ml  Output 900 ml  Net -540 ml   Filed Weights   08/02/20 0558 08/04/20 0620 08/09/20 0500  Weight: 76.4 kg 77.6 kg 76.8 kg   Weight change:   Intake/Output from previous day: 09/12 0701 - 09/13 0700 In: 0  Out: 300 [Urine:300] Intake/Output this shift: Total I/O In: 360 [P.O.:360]  Out: 600 [Urine:600]  Examination: General exam: AAO , elderly, NAD, weak appearing. HEENT:Oral mucosa moist,  Ear/Nose WNL grossly, dentition normal. Respiratory system: bilaterally clear,no wheezing or crackles,no use of accessory muscle Cardiovascular system: S1 & S2 +, No JVD,. Gastrointestinal system: Abdomen soft, NT,ND, BS+ Nervous System:Alert, awake, moving extremities and grossly nonfocal Extremities: No edemaon RLE.left leg slightly swollen, dressing unwrapped and when evaluated no open wound, redness swelling much better.  Wound placed back on wrap. Skin: No rashes,no icterus. MSK: Normal muscle bulk,tone, power  , power  Data Reviewed: I have personally reviewed following labs and imaging studies CBC: Recent Labs  Lab 08/03/20 0532 08/04/20 0613 08/05/20 0720 08/07/20 0441 08/09/20 1053  WBC 13.2* 11.9* 10.3 10.6* 8.8  NEUTROABS 10.4* 9.1* 7.5 7.3  --   HGB 12.5 12.6 11.4* 11.5* 11.6*  HCT 38.3 38.5 35.5* 35.4* 37.0  MCV 92.5 92.3 91.7 94.1 94.6  PLT 481* 490* 485* 470* 638*   Basic Metabolic Panel: Recent Labs  Lab 08/04/20 0613 08/05/20 0720 08/06/20 0415 08/07/20 0441 08/09/20 1053  NA 132* 136 133* 136 136  K 4.4 3.9 4.1 4.8 4.2  CL 98 101 101 102 101  CO2 '26 26 25 25 25  ' GLUCOSE 131* 142* 127* 147* 174*  BUN '13 15 17 15 14  ' CREATININE 0.48 0.43* 0.43* 0.55 0.52  CALCIUM 9.0 8.8* 9.0 9.1 9.1  MG 2.2  --   --   --   --    GFR: Estimated Creatinine Clearance: 50.6 mL/min (by C-G formula based on SCr of 0.52 mg/dL). Liver Function Tests: Recent Labs  Lab 08/03/20 0532 08/04/20 0613 08/05/20 0720 08/09/20 1053  AST 67* 54* 48* 44*  ALT 59* 52* 49* 45*  ALKPHOS 178* 156* 140* 118  BILITOT 1.3* 1.4* 1.1 0.9  PROT 6.1* 6.4* 6.2* 6.8  ALBUMIN 1.7* 1.8* 1.7* 2.0*   No results for input(s): LIPASE, AMYLASE in the last 168 hours. No results for input(s): AMMONIA in the last 168 hours. Coagulation Profile: Recent Labs  Lab 08/03/20 0532 08/04/20 9373 08/05/20 0720 08/06/20 0937 08/07/20 0441  INR 2.4* 2.4* 2.5* 1.9* 2.1*   Cardiac Enzymes: No  results for input(s): CKTOTAL, CKMB, CKMBINDEX, TROPONINI in the last 168 hours. BNP (last 3 results) No results for input(s): PROBNP in the last 8760 hours. HbA1C: No results for input(s): HGBA1C in the last 72 hours. CBG: Recent Labs  Lab 08/08/20 1142 08/09/20 0808  GLUCAP 248* 121*   Lipid Profile: No results for input(s): CHOL, HDL, LDLCALC, TRIG, CHOLHDL, LDLDIRECT in the last 72 hours. Thyroid Function Tests: No results for input(s): TSH, T4TOTAL, FREET4, T3FREE, THYROIDAB in the last 72 hours. Anemia Panel: No results for input(s): VITAMINB12, FOLATE, FERRITIN, TIBC, IRON, RETICCTPCT in the last 72 hours. Sepsis Labs: Recent Labs  Lab 08/03/20 0532 08/04/20 0613  PROCALCITON 0.36 0.22    Recent Results (from the past 240 hour(s))  Culture, blood (routine x 2)     Status: None   Collection Time: 07/31/20  3:35 PM   Specimen: BLOOD RIGHT WRIST  Result Value Ref Range Status   Specimen Description BLOOD RIGHT WRIST  Final   Special Requests   Final    BOTTLES DRAWN AEROBIC ONLY Blood Culture results may not be optimal due to an inadequate volume of blood received in culture bottles   Culture   Final    NO GROWTH 5 DAYS Performed at [facility], [place] [address] [place], [place] [address]  Report Status 08/05/2020 FINAL  Final  Culture, blood (routine x 2)     Status: None   Collection Time: 07/31/20  3:35 PM   Specimen: BLOOD RIGHT HAND  Result Value Ref Range Status   Specimen Description BLOOD RIGHT HAND  Final   Special Requests   Final    BOTTLES DRAWN AEROBIC ONLY Blood Culture results may not be optimal due to an inadequate volume of blood received in culture bottles   Culture   Final    NO GROWTH 5 DAYS Performed at [facility], [place] [address], [place], [place] [address]    Report Status 08/05/2020 FINAL  Final     Radiology Studies: No results found.   LOS: 15 days   Antonieta Pert, MD [facility]  08/09/2020, 1:37 PM

## 2020-08-10 NOTE — Progress Notes (Signed)
PROGRESS NOTE    [name]  YHC:[id number] DOB: [birth year] DOA: 07/25/2020 PCP: Hoyt Koch, MD   Chief Complaint  Patient presents with  . Leg Pain    Brief Narrative: AS PER hpi/chart:86 year old Caucasian female with a past medical history of permanent atrial fibrillation on warfarin, chronic diastolic CHF, essential hypertension, hyperlipidemia and chronic anxiety who comes in with left leg pain nausea vomiting and diarrhea.  Met SIRS criteria and noted to have left lower extremity cellulitis.  Started on empiric IV antibiotics.  TRH asked to admit for management of sepsis secondary to left lower extremity cellulitis.  Hospital course complicated by A. fib with RVR refractory to oral and IV beta-blocker.  Received a bolus of Cardizem infusion and was started on Cardizem drip.  Cardiology consulted on 07/29/2020 to assist with the management.  Heart rate improved with addition of Cardizem drip, switched to p.o. Cardizem on 2020-07-31. Infectious disease consulted at patient's daughter request, assisted with the management, has signed off. Patient also had mild transaminitis, abdominal ultrasound revealing suspicion of cirrhosis, no other acute finding seen by Gi. She is followed by PT OT, CIR has been recommended and patient and daughter wants to pursue CIR and waiting for inpatient rehab placement.  Subjective: Seen this morning daughter at the bedside. Patient is pulling up her left leg with belt and motivated on exercise.  Left leg is less painful actually edema is significantly improving with skin wrinkling, and is less red.  Dressing was changed this morning and did not remove for patient's comfort NSVT in tele but artifact reviewed by Renee from EP. HR has remained stable except Prior to her morning meds  And did well with activity and HR with physical therapy 9/13 Awaiting for placement  Assessment & Plan:  Severe Sepsis secondary to LLE cellulitis/strep group C  bacteremia and left lower lobe community-acquired pneumonia:Sepsis POA with lactic acidosis tachycardia fever lower extremity cellulitis as a source. No DVT in duplex.  Completed antibiotics.  Received few additional days of amoxicillin. no leukocytosis afebrile. Blood culture 8/29+ with Streptococcus group C.  Left leg appears to have chronic edema erythema and improving on local wound care continue dressing daily.   Left lower lobe community-acquired pneumonia POA-improved, procalcitonin normalized. Continue pulmonary toilet and ambulation.  May not need chest physiotherapy.  Overall respiratory status is stable.  Can follow-up with chest x-ray outpatient 4 weeks  Cough after drinking water, resolved.  Seen by speech, tolerating regular diet.    Mild transaminitis: ast/alt at 44/45. ? etiology, ultrasound with chronic common bile duct dilatation, acute virus panel negative.Seen by GI.  Back on Lipitor 9/13, continue monitor LFTs intermittently   Permanent A. fib with RVR: Rate is controlled.  No significant rapid burst of A. fib.  Blood pressure somewhat soft in 90s to 100. Cont on Cardizem 240 mg ,Lopressor 50 mg bid.  Telemetry reviewed heart rate is still stable especially with physical therapy yesterday.  Continue on current regimen, reviewed with EP PA. Continue on Eliquis, changed from Coumadin.  Echo EF 60% and unremarkable TSH stable   Supratherapeutic INR resolved.  Now on Eliquis.  Acute hypoxic respiratory failure secondary pulmonary edema/pneumonia- seen on chest x-ray likely from fluid overload.  BNP around 300.  Resolved.  Continue p.o. Lasix  AKI: Resolved  Constipation continue as needed medication  Right upper lobe lung nodule need outpatient monitoring, incidental finding.  Hyperlipidemia LDL 59 goal less than 70. Resume lipitor as ast/alt in 44-45. lft  in few days  Physical deconditioning/debility leg infection sepsis complex comorbidities and advanced age.  PT OT continues  to recommends inpatient rehab and awaiting on placement.   Oral thrush- diflucan x 5 days. Off   Antibiotics.   Thrombocytosis likely stress response.  repeat CBC in few days.  Intermittently high blood sugar hba1c was 6.8, prediabetes-diabetes: We discussed about diet modification- daughter worried that patient will have  limited option w. Meal but they are watching.  Counseled about  Low carb- and chang ensure to glucerna. Lab Results  Component Value Date   HGBA1C 6.8 (H) 07/31/2020    DVT prophylaxis:  Code Status:   Code Status: Full Code  Family Communication: plan of care discussed with patient.  Daughter updated at the bedside.  Anticipating Bed at CIR tomorrow  Status is: Inpatient Remains inpatient appropriate because of further need of rehabilitation with CIR.  Awaiting on bed Dispo:Patient From: Home  Planned Disposition: Inpatient Rehab  Expected discharge date: once bed available at CIR.    Medically stable for discharge: yes Nutrition: Diet Order            Diet regular Room service appropriate? Yes; Fluid consistency: Thin  Diet effective now                 Body mass index is 29.07 kg/m. Pressure Ulcer: Pressure Injury 07/27/20 Buttocks Left Stage 2 -  Partial thickness loss of dermis presenting as a shallow open injury with a red, pink wound bed without slough. (Active)  07/27/20 2030  Location: Buttocks  Location Orientation: Left  Staging: Stage 2 -  Partial thickness loss of dermis presenting as a shallow open injury with a red, pink wound bed without slough.  Wound Description (Comments):   Present on Admission: Yes   Consultants:see note  Procedures:see note Microbiology:see note Blood Culture    Component Value Date/Time   SDES BLOOD RIGHT WRIST 07/31/2020 1535   SDES BLOOD RIGHT HAND 07/31/2020 1535   SPECREQUEST  07/31/2020 1535    BOTTLES DRAWN AEROBIC ONLY Blood Culture results may not be optimal due to an inadequate volume of blood  received in culture bottles   SPECREQUEST  07/31/2020 1535    BOTTLES DRAWN AEROBIC ONLY Blood Culture results may not be optimal due to an inadequate volume of blood received in culture bottles   CULT  07/31/2020 1535    NO GROWTH 5 DAYS Performed at [facility], [place] [address] [place], [place] [address]    CULT  07/31/2020 1535    NO GROWTH 5 DAYS Performed at [facility], [place] [address], [place], [place] [address]    REPTSTATUS 08/05/2020 FINAL 07/31/2020 1535   REPTSTATUS 08/05/2020 FINAL 07/31/2020 1535    Other culture-see note  Medications: Scheduled Meds: . apixaban  5 mg Oral BID  . atorvastatin  20 mg Oral q1800  . calcium-vitamin D  1 tablet Oral QHS  . dextromethorphan-guaiFENesin  2 tablet Oral BID  . diltiazem  240 mg Oral Daily  . docusate sodium  100 mg Oral BID  . feeding supplement (GLUCERNA SHAKE)  237 mL Oral BID BM  . fluconazole  100 mg Oral Daily  . furosemide  40 mg Oral Daily  . ipratropium  0.5 mg Nebulization BID  . levalbuterol  0.63 mg Nebulization BID  . metoprolol tartrate  50 mg Oral BID  . mineral oil  1 enema Rectal Once  . multivitamin with minerals  1 tablet  Oral Daily  . polyethylene glycol  17 g Oral Daily  . saccharomyces boulardii  250 mg Oral BID   Continuous Infusions:  Antimicrobials: Anti-infectives (From admission, onward)   Start     Dose/Rate Route Frequency Ordered Stop   08/09/20 1000  fluconazole (DIFLUCAN) tablet 100 mg        100 mg Oral Daily 08/09/20 0746 08/14/20 0959   08/07/20 1515  nystatin (MYCOSTATIN) tablet 500,000 Units  Status:  Discontinued        500,000 Units Oral Every 8 hours 08/07/20 1514 08/07/20 1614   07/29/20 1400  amoxicillin (AMOXIL) capsule 500 mg  Status:  Discontinued        500 mg Oral Every 8 hours 07/29/20 1140 08/09/20 1345   07/28/20 1400  ceFAZolin (ANCEF) IVPB 2g/100 mL premix  Status:  Discontinued        2 g 200 mL/hr over 30 Minutes Intravenous Every 8 hours 07/28/20  1249 07/29/20 1140   07/27/20 0600  cefTRIAXone (ROCEPHIN) 2 g in sodium chloride 0.9 % 100 mL IVPB  Status:  Discontinued        2 g 200 mL/hr over 30 Minutes Intravenous Every 24 hours 07/26/20 0842 07/26/20 1046   07/26/20 1800  cefTRIAXone (ROCEPHIN) 2 g in sodium chloride 0.9 % 100 mL IVPB  Status:  Discontinued        2 g 200 mL/hr over 30 Minutes Intravenous Every 24 hours 07/26/20 1046 07/28/20 1249   07/26/20 1700  vancomycin (VANCOCIN) IVPB 1000 mg/200 mL premix  Status:  Discontinued        1,000 mg 200 mL/hr over 60 Minutes Intravenous Every 24 hours 07/25/20 1553 07/26/20 0842   07/26/20 0600  vancomycin (VANCOREADY) IVPB 750 mg/150 mL  Status:  Discontinued        750 mg 150 mL/hr over 60 Minutes Intravenous Every 12 hours 07/25/20 1551 07/25/20 1553   07/26/20 0430  ceFEPIme (MAXIPIME) 2 g in sodium chloride 0.9 % 100 mL IVPB  Status:  Discontinued        2 g 200 mL/hr over 30 Minutes Intravenous Every 12 hours 07/25/20 1551 07/26/20 0842   07/25/20 1600  vancomycin (VANCOREADY) IVPB 1500 mg/300 mL        1,500 mg 150 mL/hr over 120 Minutes Intravenous  Once 07/25/20 1549 07/25/20 1838   07/25/20 1545  ceFEPIme (MAXIPIME) 2 g in sodium chloride 0.9 % 100 mL IVPB        2 g 200 mL/hr over 30 Minutes Intravenous  Once 07/25/20 1531 07/25/20 1622   07/25/20 1545  metroNIDAZOLE (FLAGYL) IVPB 500 mg        500 mg 100 mL/hr over 60 Minutes Intravenous  Once 07/25/20 1531 07/25/20 1729   07/25/20 1545  vancomycin (VANCOCIN) IVPB 1000 mg/200 mL premix  Status:  Discontinued        1,000 mg 200 mL/hr over 60 Minutes Intravenous  Once 07/25/20 1531 07/25/20 1549     Objective: Vitals: Today's Vitals   08/10/20 0008 08/10/20 0107 08/10/20 0541 08/10/20 0807  BP:   109/64   Pulse:   82   Resp:   18   Temp:   97.6 F (36.4 C)   TempSrc:   Oral   SpO2:   96% 98%  Weight:      Height:      PainSc: 7  Asleep      Intake/Output Summary (Last 24 hours) at 08/10/2020  1016 Last data filed  at 08/10/2020 0600 Gross per 24 hour  Intake 600 ml  Output 1050 ml  Net -450 ml   Filed Weights   08/02/20 0558 08/04/20 0620 08/09/20 0500  Weight: 76.4 kg 77.6 kg 76.8 kg   Weight change:   Intake/Output from previous day: 09/13 0701 - 09/14 0700 In: 960 [P.O.:960] Out: 1650 [Urine:1650] Intake/Output this shift: No intake/output data recorded.  Examination: General exam: AAOx3, pleasant , NAD, weak appearing. HEENT:Oral mucosa moist, Ear/Nose WNL grossly, dentition normal. Respiratory system: bilaterally clear, coughs with deep breath intermittently,no use of accessory muscle Cardiovascular system: S1 & S2 +, No JVD,. Gastrointestinal system: Abdomen soft, NT,ND, BS+ Nervous System:Alert, awake, moving extremities and grossly nonfocal Extremities: No edema ln RLE, Redness swelling  In left lower leg, overall swelling /redness improving.  Did not remove the wound dressing today for comfort.  MSK: Normal muscle bulk,tone, power  Data Reviewed: I have personally reviewed following labs and imaging studies CBC: Recent Labs  Lab 08/04/20 0613 08/05/20 0720 08/07/20 0441 08/09/20 1053  WBC 11.9* 10.3 10.6* 8.8  NEUTROABS 9.1* 7.5 7.3  --   HGB 12.6 11.4* 11.5* 11.6*  HCT 38.5 35.5* 35.4* 37.0  MCV 92.3 91.7 94.1 94.6  PLT 490* 485* 470* 950*   Basic Metabolic Panel: Recent Labs  Lab 08/04/20 0613 08/05/20 0720 08/06/20 0415 08/07/20 0441 08/09/20 1053  NA 132* 136 133* 136 136  K 4.4 3.9 4.1 4.8 4.2  CL 98 101 101 102 101  CO2 _0 GLUCOSE 131* 142* 127* 147* 174*  BUN _1 CREATININE 0.48 0.43* 0.43* 0.55 0.52  CALCIUM 9.0 8.8* 9.0 9.1 9.1  MG 2.2  --   --   --   --    GFR: Estimated Creatinine Clearance: 50.6 mL/min (by C-G formula based on SCr of 0.52 mg/dL). Liver Function Tests: Recent Labs  Lab 08/04/20 0613 08/05/20 0720 08/09/20 1053  AST 54* 48* 44*  ALT 52* 49* 45*  ALKPHOS 156* 140* 118   BILITOT 1.4* 1.1 0.9  PROT 6.4* 6.2* 6.8  ALBUMIN 1.8* 1.7* 2.0*   No results for input(s): LIPASE, AMYLASE in the last 168 hours. No results for input(s): AMMONIA in the last 168 hours. Coagulation Profile: Recent Labs  Lab 08/04/20 0613 08/05/20 0720 08/06/20 0937 08/07/20 0441  INR 2.4* 2.5* 1.9* 2.1*   Cardiac Enzymes: No results for input(s): CKTOTAL, CKMB, CKMBINDEX, TROPONINI in the last 168 hours. BNP (last 3 results) No results for input(s): PROBNP in the last 8760 hours. HbA1C: No results for input(s): HGBA1C in the last 72 hours. CBG: Recent Labs  Lab 08/08/20 1142 08/09/20 0808  GLUCAP 248* 121*   Lipid Profile: No results for input(s): CHOL, HDL, LDLCALC, TRIG, CHOLHDL, LDLDIRECT in the last 72 hours. Thyroid Function Tests: No results for input(s): TSH, T4TOTAL, FREET4, T3FREE, THYROIDAB in the last 72 hours. Anemia Panel: No results for input(s): VITAMINB12, FOLATE, FERRITIN, TIBC, IRON, RETICCTPCT in the last 72 hours. Sepsis Labs: Recent Labs  Lab 08/04/20 9326  PROCALCITON 0.22    Recent Results (from the past 240 hour(s))  Culture, blood (routine x 2)     Status: None   Collection Time: 07/31/20  3:35 PM   Specimen: BLOOD RIGHT WRIST  Result Value Ref Range Status   Specimen Description BLOOD RIGHT WRIST  Final   Special Requests   Final    BOTTLES DRAWN AEROBIC ONLY Blood Culture results may not be optimal  due to an inadequate volume of blood received in culture bottles   Culture   Final    NO GROWTH 5 DAYS Performed at [facility], [place] [address], [place], [place] [address]    Report Status 08/05/2020 FINAL  Final  Culture, blood (routine x 2)     Status: None   Collection Time: 07/31/20  3:35 PM   Specimen: BLOOD RIGHT HAND  Result Value Ref Range Status   Specimen Description BLOOD RIGHT HAND  Final   Special Requests   Final    BOTTLES DRAWN AEROBIC ONLY Blood Culture results may not be optimal due to an inadequate volume of  blood received in culture bottles   Culture   Final    NO GROWTH 5 DAYS Performed at [facility], [place] [name], [place], [place] [address]    Report Status 08/05/2020 FINAL  Final     Radiology Studies: No results found.   LOS: 16 days   Antonieta Pert, MD [facility]  08/10/2020, 10:16 AM

## 2020-08-11 MED ORDER — SALINE SPRAY 0.65 % NA SOLN
1.0000 | NASAL | Status: DC | PRN
  Filled 2020-08-11: qty 44

## 2020-08-11 NOTE — TOC Progression Note (Addendum)
Transition of Care [facility]) - Progression Note    Patient Details  Name: [name] [name] MRN: [id number] Date of Birth: 15-Jul-1931  [facility] CM/SW Contact  Graves-Bigelow, Ocie Cornfield, RN Phone Number: 08/11/2020, 2:35 PM  Clinical Narrative:  Case Manager spoke with daughter regarding CIR @ [facility]. Both daughter and patient agreeable to have liaison speak with them regarding transition. Liaison spoke with family- pt is a good candidate for rehab per liaison. Patient will have a bed available tomorrow- MD is aware. Daughter is also aware- and patient will transition on tomorrow via [place]. Case Manager will continue to follow for additional transition of care needs.   [id number] 08-11-20 Case Manager faxed paperwork to [facility]. Case Manager will speak with Liaison to get room number and who to call report. Case Manager will continue to follow for additional transition of care needs.    Expected Discharge Plan: IP Rehab Facility Barriers to Discharge: No Barriers Identified  Expected Discharge Plan and Services Expected Discharge Plan: [place]    Readmission Risk Interventions No flowsheet data found.

## 2020-08-11 NOTE — Discharge Summary (Signed)
Physician Discharge Summary  [name] [name] [facility] ZHY:[id number] DOB: 03-22-1931 DOA: 07/25/2020  PCP: Hoyt Koch, MD  Admit date: 07/25/2020 Discharge date: 08/12/2020  Admitted From:home Disposition:  Inpatient Rehab  Recommendations for Outpatient Follow-up:  1. Follow up with PCP in 1-2 weeks 2. Please obtain BMP/CBC in one week 3. Please follow up on the following pending results:  Home Health:No  Equipment/Devices: None  Discharge Condition: Stable Code Status:   Code Status: Full Code Diet recommendation:  Diet Order            Diet - low sodium heart healthy           Diet regular Room service appropriate? Yes; Fluid consistency: Thin  Diet effective now                 Brief/Interim Summary: 86 year old Caucasian female with a past medical history of permanent atrial fibrillation on warfarin, chronic diastolic CHF, essential hypertension, hyperlipidemia and chronic anxiety who comes in with left leg pain nausea vomiting and diarrhea.  Met SIRS criteria and noted to have left lower extremity cellulitis.  Started on empiric IV antibiotics.  TRH asked to admit for management of sepsis secondary to left lower extremity cellulitis.  Hospital course complicated by A. fib with RVR refractory to oral and IV beta-blocker.  Received a bolus of Cardizem infusion and was started on Cardizem drip.  Cardiology consulted on 07/29/2020 to assist with the management.  Heart rate improved with addition of Cardizem drip, switched to p.o. Cardizem on 2020-07-31. Infectious disease consulted at patient's daughter request, assisted with the management, has signed off. Patient also had mild transaminitis, abdominal ultrasound revealing suspicion of cirrhosis, no other acute finding seen by Gi. She is followed by PT OT, CIR has been recommended and patient and daughter wants to pursue CIR and waiting for inpatient rehab placement. 9/15: Dressing removed and examined overall much improved no  erythema or tenderness edema improving.. 9/16: No new changes patient and daughter agreeable for discharge to inpatient rehab today at [facility].  Her heart rate remained stable/medically stable for discahrge  Discharge Diagnoses:   Severe Sepsis secondary to LLE cellulitis/strep group C bacteremia and left lower lobe community-acquired pneumonia: Sepsis POA with lactic acidosis tachycardia fever lower extremity cellulitis as a source. No DVT in duplex.Blood culture 8/29+ with Streptococcus group C.  Initially on IV vancomycin cefepime and flagyl in ED-then on ceftriaxone subsequently Ancef and then switched tp AMoxycillin 9/2/ to 9/13 completed course. Keep the left leg elevated continue daily dressing as instructed.  Watch for any signs and symptoms of infection  Left lower lobe community-acquired pneumonia POA-: Completed antibiotics, improved, procalcitonin normalized.  Continue incentive spirometry pulmonary toileting ambulation. Follow-up with chest x-ray outpatient 4 weeks.  Cough after drinking water, resolved.  Seen by speech, tolerating regular diet.  Advised incentive spirometry  Mild transaminitis: ast/alt at 44/45. ? etiology, ultrasound with chronic common bile duct dilatation, acute virus panel negative.Seen by GI.  Back on Lipitor 9/13, continue monitor LFTs intermittently   Permanent A. fib with RVR: rate is controlled even with PT session now.  Continue with Cardizem 240 Lopressor 50 twice daily, Eliquis- that was switched from Coumadin. tsh stable and echo Nl LVEF this admission.  Patient was seen by EP cardiology.  Supratherapeutic INR due to Coumadin, resolved.  Now on Eliquis.  Acute hypoxic respiratory failure secondary pulmonary edema/pneumonia- seen on chest x-ray likely from fluid overload.  BNP around 300.  Resolved.  Continue p.o. Lasix.  Resp status stable-advised ambulation, incentive spirometry, antitussives.  LOV:FIEPPIRJ.  Constipation continue as needed  medication  Right upper lobe lung nodule need outpatient monitoring, incidental finding.  Hyperlipidemia LDL 59 goal less than 70.  Continue Lipitor check LFTs intermittently  Physical deconditioning/debility leg infection sepsis complex comorbidities and advanced age.  Patient is waiting for inpatient rehabilitation continue PT OT.   Oral thrush-completing Diflucan dose today.  Stop on discharge.    Thrombocytosis likely stress response.  repeat CBC in few days.  Intermittently high blood sugar hba1c was 6.8, prediabetes-diabetes: We discussed about diet modification- daughter worried that patient will have  limited option w. Meal but they are watching her blood sugar.  I have counseled on low carbohydrate diet.  I have changed Ensure to Glucerna . I would like to request inpatient rehab team to monitor blood sugar closely and iflood sugar remains consistently high consider starting oral hypoglycemic agent  Consults:  EP cardio  ID   Subjective: Alert awake not in distress.  On room air.  Having bowel movement. Daughter at the bedside. Today she has a bed at [facility] inpatient rehab.  Discharge Exam: Vitals:   08/12/20 0500 08/12/20 1046  BP: 109/66 103/61  Pulse: 87 (!) 121  Resp: 18   Temp: 98.2 F (36.8 C)   SpO2: 94% 94%   General: Pt is alert, awake, not in acute distress Cardiovascular: RRR, S1/S2 +, no rubs, no gallops Respiratory: CTA bilaterally, no wheezing, no rhonchi Abdominal: Soft, NT, ND, bowel sounds + Extremities: no edema, no cyanosis Left leg with chronic edema improved erythema, no drainage.  Improved cellulitis.  See the picture.   Discharge Instructions Discharge Instructions    Diet - low sodium heart healthy   Complete by: As directed    Discharge instructions   Complete by: As directed    Limit carbohydrate in diet, intermittently check blood sugar a.  Please call call MD or return to ER for similar or worsening recurring problem that  brought you to hospital or if any fever,nausea/vomiting,abdominal pain, uncontrolled pain, chest pain,  shortness of breath or any other alarming symptoms.  Please follow-up your doctor as instructed in a week time and call the office for appointment.  Please avoid alcohol, smoking, or any other illicit substance and maintain healthy habits including taking your regular medications as prescribed.  You were cared for by a hospitalist during your hospital stay. If you have any questions about your discharge medications or the care you received while you were in the hospital after you are discharged, you can call the unit and ask to speak with the hospitalist on call if the hospitalist that took care of you is not available.  Once you are discharged, your primary care physician will handle any further medical issues. Please note that NO REFILLS for any discharge medications will be authorized once you are discharged, as it is imperative that you return to your primary care physician (or establish a relationship with a primary care physician if you do not have one) for your aftercare needs so that they can reassess your need for medications and monitor your lab values   Discharge wound care:   Complete by: As directed    Wound care  Daily      Comments: Cleanse with NS and moist gauze. Lightly scrub to remove loose skin. Apply Xeroform gauze Kellie Simmering # 294) daily, cover with ABD pads and wrap with Kerlex. Apply dressing  As needed   Increase activity  slowly   Complete by: As directed      Allergies as of 08/12/2020      Reactions   Antihistamines, Diphenhydramine-type Other (See Comments)   ALL ANTIHISTAMINES causes her heart to race   Oxycodone-acetaminophen Nausea Only      Medication List    STOP taking these medications   hydrochlorothiazide 25 MG tablet Commonly known as: HYDRODIURIL   metoprolol succinate 100 MG 24 hr tablet Commonly known as: TOPROL-XL   warfarin 2.5 MG  tablet Commonly known as: COUMADIN     TAKE these medications   acetaminophen 500 MG tablet Commonly known as: TYLENOL Take 1,000 mg by mouth every 6 (six) hours as needed for headache (pain). For pain   ALPRAZolam 0.25 MG tablet Commonly known as: XANAX Take 1 tablet (0.25 mg total) by mouth daily as needed. for anxiety What changed:   reasons to take this  additional instructions   apixaban 5 MG Tabs tablet Commonly known as: ELIQUIS Take 1 tablet (5 mg total) by mouth 2 (two) times daily.   atorvastatin 20 MG tablet Commonly known as: LIPITOR Take 1 tablet (20 mg total) by mouth daily at 6 PM. Annual appt due in Sept must see provider for future refills   bisacodyl 10 MG suppository Commonly known as: DULCOLAX Place 1 suppository (10 mg total) rectally daily as needed for moderate constipation.   calcium-vitamin D 500-200 MG-UNIT tablet Take 1 tablet by mouth every other day.   dextromethorphan-guaiFENesin 30-600 MG 12hr tablet Commonly known as: MUCINEX DM Take 2 tablets by mouth 2 (two) times daily as needed for cough.   diltiazem 240 MG 24 hr capsule Commonly known as: CARDIZEM CD Take 1 capsule (240 mg total) by mouth daily. Start taking on: August 13, 2020   docusate sodium 100 MG capsule Commonly known as: COLACE Take 1 capsule (100 mg total) by mouth 2 (two) times daily.   feeding supplement (GLUCERNA SHAKE) Liqd Take 237 mLs by mouth 2 (two) times daily between meals. What changed:   when to take this  reasons to take this   furosemide 40 MG tablet Commonly known as: LASIX TAKE 1 TABLET BY MOUTH EVERY DAY What changed:   when to take this  additional instructions   Gerhardt's butt cream Crea Apply 1 application topically daily as needed for irritation.   metoprolol tartrate 50 MG tablet Commonly known as: LOPRESSOR Take 1 tablet (50 mg total) by mouth 2 (two) times daily.   PreserVision AREDS 2 Caps Take 1 capsule by mouth in the  morning and at bedtime.   multivitamin with minerals tablet Take 1 tablet by mouth daily at 6 PM.   polyethylene glycol 17 g packet Commonly known as: MIRALAX / GLYCOLAX Take 17 g by mouth daily. Start taking on: August 13, 2020   saccharomyces boulardii 250 MG capsule Commonly known as: FLORASTOR Take 1 capsule (250 mg total) by mouth 2 (two) times daily.   THERATEARS OP Place 1 drop into both eyes 2 (two) times daily as needed (dry eyes).   Triple Paste 12.8 % ointment Generic drug: Zinc Oxide Apply 1 application topically as needed for irritation.            Discharge Care Instructions  (From admission, onward)         Start     Ordered   08/12/20 0000  Discharge wound care:       Comments: Wound care  Daily      Comments:  Cleanse with NS and moist gauze. Lightly scrub to remove loose skin. Apply Xeroform gauze Kellie Simmering # 294) daily, cover with ABD pads and wrap with Kerlex. Apply dressing  As needed   08/12/20 1058          Follow-up Information    Shirley Friar, PA-C Follow up on 08/27/2020.   Specialty: Physician Assistant Why: at 1120 for post hospital follow up with Dr. Forde Dandy office.  Contact information: [address]  [place] [address] [phone]        Hoyt Koch, MD Follow up in 1 week(s).   Specialty: Internal Medicine Contact information: [place] [address] [phone]        Evans Lance, MD .   Specialty: Cardiology Contact information: [phone]  [place] [address] [phone]              Allergies  Allergen Reactions  . Antihistamines, Diphenhydramine-Type Other (See Comments)    ALL ANTIHISTAMINES causes her heart to race  . Oxycodone-Acetaminophen Nausea Only    The results of significant diagnostics from this hospitalization (including imaging, microbiology, ancillary and laboratory) are listed below for reference.     Microbiology: No results found for this or any previous visit (from the past 240 hour(s)).  Procedures/Studies: CT Angio Chest PE W/Cm &/Or Wo Cm  Result Date: 07/25/2020 CLINICAL DATA:  PE suspected, high prob Left leg swelling and pain. EXAM: CT ANGIOGRAPHY CHEST WITH CONTRAST TECHNIQUE: Multidetector CT imaging of the chest was performed using the standard protocol during bolus administration of intravenous contrast. Multiplanar CT image reconstructions and MIPs were obtained to evaluate the vascular anatomy. Performed in conjunction with CT of the abdomen/pelvis, reported separately. CONTRAST:  36m OMNIPAQUE IOHEXOL 350 MG/ML SOLN COMPARISON:  Radiograph earlier today. FINDINGS: Cardiovascular: There are no filling defects within the pulmonary arteries to suggest pulmonary embolus. Atherosclerosis of the thoracic aorta without aneurysm or dissection. Aorta is tortuous. Mild cardiomegaly, with particular left atrial enlargement. Minimal pericardial fluid without significant effusion. Coronary artery calcifications. Mediastinum/Nodes: Small right hilar nodes are subcentimeter and not enlarged by size criteria. There is no mediastinal or left hilar adenopathy. Patulous mid and upper esophagus. No esophageal wall thickening. No suspicious thyroid nodule. Lungs/Pleura: Diffusely heterogeneous pulmonary parenchyma. Sub solid nodular opacity in the right upper lobe measures 12 mm, series 4, image 51. Irregular subpleural opacities involving the right greater than left lung apex are typical of pleuroparenchymal scarring, however appears slightly nodular on the right. No septal thickening or findings of pulmonary edema. No pleural effusion. Scattered atelectasis in both dependent lower lobes. Excretory phase imaging limits bronchial assessment. Upper Abdomen: Assessed on concurrent abdominal CT, reported separately. Musculoskeletal: Exaggerated upper thoracic kyphosis. Multilevel degenerative change in the  spine. There are no acute or suspicious osseous abnormalities. Surgical hardware in the left proximal humerus. Review of the MIP images confirms the above findings. IMPRESSION: 1. No pulmonary embolus. 2. Diffusely heterogeneous pulmonary parenchyma, can be seen with small vessel or small airways disease. 3. Sub solid ground-glass nodular opacity in the right upper lobe measures 12 mm. This is nonspecific, possibly infectious/inflammatory. Follow-up non-contrast CT recommended at 3-6 months to confirm persistence. If unchanged, and solid component remains <6 mm, annual CT is recommended until 5 years of stability has been established. If persistent these nodules should be considered highly suspicious if the solid component of the nodule is 6 mm or greater in size and enlarging. This recommendation  follows the consensus statement: Guidelines for Management of Incidental Pulmonary Nodules Detected on CT Images: From the Fleischner Society 2017; Radiology 2017; 284:228-243. 4. Irregular subpleural opacities involving the right greater than left lung apex are typical of pleuroparenchymal scarring, however appears slightly nodular on the right. Recommend attention to this at follow-up. 5. Mild cardiomegaly with particular left atrial enlargement. Coronary artery calcifications. Aortic Atherosclerosis (ICD10-I70.0). Electronically Signed   By: Keith Rake M.D.   On: 07/25/2020 18:27   US Abdomen Complete  Result Date: 07/31/2020 CLINICAL DATA:  Transaminitis. EXAM: ABDOMEN ULTRASOUND COMPLETE COMPARISON:  CT, 07/25/2020 FINDINGS: Gallbladder: Surgically absent Common bile duct: Diameter: 9 mm Liver: Coarsened liver echotexture. Subtle surface nodularity. No defined mass. Calcification noted along the inferior margin of the liver stable from the prior CT. Portal vein is patent on color Doppler imaging with normal direction of blood flow towards the liver. IVC: No abnormality visualized. Pancreas: Mostly obscured.  Spleen: Size and appearance within normal limits. Right Kidney: Length: 11.5 cm. Echogenicity within normal limits. No mass or hydronephrosis visualized. Left Kidney: Length: 11.1 cm. Echogenicity within normal limits. No mass or hydronephrosis visualized. Abdominal aorta: No aneurysm visualized. Other findings: None. IMPRESSION: 1. Coarsened liver echotexture with subtle surface nodularity. Findings support cirrhosis in the proper clinical setting. 2. No liver masses. 3. Status post cholecystectomy. Chronic dilation of the common bile duct. 4. No acute findings. Electronically Signed   By: Lajean Manes M.D.   On: 07/31/2020 11:45   CT Abdomen Pelvis W Contrast  Result Date: 07/25/2020 CLINICAL DATA:  Acute nonlocalized abdominal pain. Left leg swelling and pain. EXAM: CT ABDOMEN AND PELVIS WITH CONTRAST TECHNIQUE: Multidetector CT imaging of the abdomen and pelvis was performed using the standard protocol following bolus administration of intravenous contrast. CONTRAST:  25m OMNIPAQUE IOHEXOL 350 MG/ML SOLN COMPARISON:  No prior abdominal imaging. Concurrent chest CT reviewed. FINDINGS: Lower chest: Assessed on concurrent chest CT. Cardiomegaly. Coronary artery calcifications. Hepatobiliary: Post cholecystectomy. Biliary prominence is likely normal post cholecystectomy. Common bile duct measures 8 mm at the porta hepatis. There is a coarse calcification involving the anterior subcapsular liver, partially obscured by breathing motion artifact. No underlying lesion is seen on delayed phase. No suspicious focal hepatic lesion. Pancreas: Parenchymal atrophy. No ductal dilatation or inflammation. Spleen: Normal in size without focal abnormality. Adrenals/Urinary Tract: No adrenal nodule. No hydronephrosis or perinephric edema. Homogeneous renal enhancement with symmetric excretion on delayed phase imaging. Possible nonobstructing stone in the lower left kidney versus early excretion of IV contrast. Urinary bladder  is distended without wall thickening. Stomach/Bowel: Nondistended stomach. There is no small bowel obstruction or inflammatory change. High-riding cecum in the right upper quadrant. Normal appendix. Interposition of the colon anterior to the liver. Diffuse and multifocal colonic diverticulosis. Sigmoid colon is tortuous. No evidence of diverticulitis. Stool distends the rectum with possible rectocele. Vascular/Lymphatic: There prominent mildly enlarged lymph nodes in the left inguinal region measuring up to 14 mm. Prominent left external iliac nodes measure up to 9 mm. There is fat stranding adjacent to the external iliac and common femoral vessels. No discrete intraluminal filling defect in the venous structures. Arterial vascular calcifications without dissection, or acute arterial abnormality. Additional prominent left retroperitoneal nodes measure up to 8 mm the level of the iliac bifurcation. Aortic atherosclerosis and tortuosity. No aortic aneurysm. Patent portal vein. Reproductive: Status post hysterectomy. No adnexal masses. Left ovary tentatively visualized and quiescent. Right ovary not definitively seen. Other: No ascites or free fluid. No free  air. No abdominopelvic abscess. There is fat stranding involving the left anterior aspect of the thigh which tracks along the iliac vessels into the retroperitoneum. No soft tissue air. Musculoskeletal: Mild to moderate compression fractures of L3 and L4 as well as mild L5 compression fracture likely chronic. Bones are diffusely under mineralized. Diffuse multilevel degenerative change in the spine. No evidence of left hip joint effusion or bony destruction. Fat stranding involving the subcutaneous tissues of the left inguinal region and anterior upper thigh without CT evidence of muscular involvement. IMPRESSION: 1. Fat stranding involving the left anterior aspect of the upper thigh which tracks along the iliac vessels into the retroperitoneum. No soft tissue air,  focal fluid collection, or deep muscle involvement. There are also prominent left inguinal and external iliac nodes. Suspect left lower extremity cellulitis with reactive adenopathy, however clinical correlation is needed. 2. No lower extremity DVT or acute vascular findings. 3. Colonic diverticulosis without diverticulitis. Stool distends the rectum with possible rectocele. 4. Compression fractures of L3, L4, and L5 are likely chronic. Aortic Atherosclerosis (ICD10-I70.0). Electronically Signed   By: Keith Rake M.D.   On: 07/25/2020 18:36   DG CHEST PORT 1 VIEW  Result Date: 07/31/2020 CLINICAL DATA:  Cough EXAM: PORTABLE CHEST 1 VIEW COMPARISON:  July 27, 2020 FINDINGS: The heart size and mediastinal contours are mildly enlarged. Aortic calcifications are seen. Small bilateral effusions are seen. Increased interstitial markings are seen throughout both lungs. No acute osseous abnormality. IMPRESSION: Increased interstitial markings seen throughout both lungs which may be due to atelectasis and/or infectious etiology. Small bilateral pleural effusions. Electronically Signed   By: Prudencio Pair M.D.   On: 07/31/2020 19:26   DG Chest Port 1 View  Result Date: 07/27/2020 CLINICAL DATA:  Shortness of breath. EXAM: PORTABLE CHEST 1 VIEW COMPARISON:  07/25/2020. FINDINGS: Cardiomegaly with increased bilateral interstitial prominence. Bilateral pleural effusions. Findings consistent with CHF. Pneumonitis cannot be excluded. Postsurgical changes left humerus. IMPRESSION: Cardiomegaly with increased bilateral interstitial prominence and bilateral pleural effusions consistent with CHF. Pneumonitis cannot be excluded. Electronically Signed   By: Marcello Moores  Register   On: 07/27/2020 12:10   DG Chest Port 1 View  Result Date: 07/25/2020 CLINICAL DATA:  Questionable sepsis - evaluate for abnormality Chest pain.  Leg edema. EXAM: PORTABLE CHEST 1 VIEW COMPARISON:  Chest radiograph 09/29/2017 FINDINGS: Patient is  rotated. The chin obscures the left lung apex. Left costophrenic angle is not included in the field of view. Mild cardiomegaly. Aortic atherosclerosis. Questionable ill-defined left basilar opacity versus overlapping soft tissue structures. Mild vascular congestion no pneumothorax. Surgical hardware in the left proximal humerus. IMPRESSION: 1. Mild cardiomegaly and vascular congestion. 2. Questionable ill-defined left basilar opacity versus overlapping soft tissue structures. 3. Limited exam due to patient's chin obscuring the apices, rotation, and left costophrenic angle excluded from the field of view. PA and lateral views recommended when patient is able. Electronically Signed   By: Keith Rake M.D.   On: 07/25/2020 15:58   DG Foot Complete Left  Result Date: 08/02/2020 CLINICAL DATA:  Red, swollen left foot. EXAM: LEFT FOOT - COMPLETE 3+ VIEW COMPARISON:  04/15/2010 FINDINGS: Mild diffuse soft tissue swelling. No soft tissue gas, bone destruction or periosteal reaction. Diffuse arterial calcifications. Calcaneal enthesophytes. Dorsal tarsal degenerative changes. IMPRESSION: Mild diffuse soft tissue swelling without underlying bony abnormality. Electronically Signed   By: Claudie Revering M.D.   On: 08/02/2020 18:40   DG Swallowing Func-Speech Pathology  Result Date: 08/04/2020 Objective Swallowing Evaluation:  Type of Study: MBS-Modified Barium Swallow Study  Patient Details Name: [name] [name] MRN: [id number] Date of Birth: 06/06/1931 Today's Date: 08/04/2020 Time: SLP Start Time (ACUTE ONLY): 0930 -SLP Stop Time (ACUTE ONLY): 0950 SLP Time Calculation (min) (ACUTE ONLY): 20 min Past Medical History: Past Medical History: Diagnosis Date . A-fib ([place])   paroxysmal . Aftercare for healing traumatic fracture of arm, unspecified  . H/O: hemorrhoidectomy  . H/O: hysterectomy  . Hyperlipidemia  . Hypertension  . Malignant melanoma of skin of trunk, except scrotum ()  . Other postprocedural status(V45.89)  .  Rosacea  . S/P cholecystectomy  . Sciatica   right . Sprain of lumbar region  Past Surgical History: Past Surgical History: Procedure Laterality Date . ABDOMINAL HYSTERECTOMY   . BLADDER SUSPENSION    never go surgery because it got canceled due to heart rate  . cataract Bilateral 2019 . catheter ablation of atrail flutter   . CHOLECYSTECTOMY   . HERNIA REPAIR   HPI: Pt is an 86 y.o. female with medical history significant for atrial fibrillation, chronic diastolic heart failure, hypertension, hyperlipidemia and anxiety who presented with worsening left leg pain and nausea, vomiting and diarrhea. CXR 9/4: Increased interstitial markings seen throughout both lungs which may be due to atelectasis and/or infectious etiology. Per referring MD's note, RN has noted cough after drinking water and pt has worsening leukocytosis.  No data recorded Assessment / Plan / Recommendation CHL IP CLINICAL IMPRESSIONS 08/03/2020 Clinical Impression Pt demonstrates adequate swallow function though mild deficits are noted, mostly secondary to pts severe kyphosis with anterior head positioning. Pt takes a large straw sip, orally hold the bolus with tiny piecemeal swallows with early airway closure. There was one instance of trace penetration during the swallow with slightly incomplete airway closure. Though no coughing occurred during this test, it is probable that pt has instances of sensed frank penetration with a cough during meals given her positioning. Very unlikely that pt is having detrimental or chornic aspiration events as her strength is good and she uses good adaptive behaviors without awareness. No SLP f/u needed, pt may continue her home diet, regular solids and thin liquids. Will sign off.  SLP Visit Diagnosis Dysphagia, unspecified (R13.10) Attention and concentration deficit following -- Frontal lobe and executive function deficit following -- Impact on safety and function Mild aspiration risk   [facility] IP TREATMENT  RECOMMENDATION 08/03/2020 Treatment Recommendations No treatment recommended at this time   No flowsheet data found. [facility] IP DIET RECOMMENDATION 08/03/2020 SLP Diet Recommendations Regular solids;Thin liquid Liquid Administration via Cup;Straw Medication Administration Whole meds with liquid Compensations Slow rate;Small sips/bites Postural Changes Remain semi-upright after after feeds/meals (Comment)   [facility] IP OTHER RECOMMENDATIONS 08/03/2020 Recommended Consults -- Oral Care Recommendations Oral care BID Other Recommendations --   CHL IP FOLLOW UP RECOMMENDATIONS 08/03/2020 Follow up Recommendations None   CHL IP FREQUENCY AND DURATION 08/01/2020 Speech Therapy Frequency (ACUTE ONLY) min 2x/week Treatment Duration 2 weeks      CHL IP ORAL PHASE 08/03/2020 Oral Phase Impaired Oral - Pudding Teaspoon -- Oral - Pudding Cup -- Oral - Honey Teaspoon -- Oral - Honey Cup -- Oral - Nectar Teaspoon -- Oral - Nectar Cup -- Oral - Nectar Straw -- Oral - Thin Teaspoon -- Oral - Thin Cup -- Oral - Thin Straw Piecemeal swallowing Oral - Puree Piecemeal swallowing Oral - Mech Soft -- Oral - Regular Piecemeal swallowing Oral - Multi-Consistency -- Oral - Pill Piecemeal swallowing Oral Phase -  Comment --  CHL IP PHARYNGEAL PHASE 08/03/2020 Pharyngeal Phase Impaired Pharyngeal- Pudding Teaspoon -- Pharyngeal -- Pharyngeal- Pudding Cup -- Pharyngeal -- Pharyngeal- Honey Teaspoon -- Pharyngeal -- Pharyngeal- Honey Cup -- Pharyngeal -- Pharyngeal- Nectar Teaspoon -- Pharyngeal -- Pharyngeal- Nectar Cup -- Pharyngeal -- Pharyngeal- Nectar Straw -- Pharyngeal -- Pharyngeal- Thin Teaspoon -- Pharyngeal -- Pharyngeal- Thin Cup -- Pharyngeal -- Pharyngeal- Thin Straw Penetration/Aspiration during swallow;WFL Pharyngeal Material enters airway, remains ABOVE vocal cords and not ejected out;Material does not enter airway Pharyngeal- Puree WFL Pharyngeal -- Pharyngeal- Mechanical Soft -- Pharyngeal -- Pharyngeal- Regular WFL Pharyngeal -- Pharyngeal-  Multi-consistency -- Pharyngeal -- Pharyngeal- Pill WFL Pharyngeal -- Pharyngeal Comment --  No flowsheet data found. DeBlois, Katherene Ponto 08/04/2020, 7:29 AM              ECHOCARDIOGRAM COMPLETE  Result Date: 07/29/2020    ECHOCARDIOGRAM REPORT   Patient Name:   [name] [name] [facility] Date of Exam: 07/29/2020 Medical Rec #:  [id number]      Height:       64.0 in Accession #:    [id number]     Weight:       195.1 lb Date of Birth:  03-May-1931      BSA:          1.936 m Patient Age:    34 years       BP:           100/61 mmHg Patient Gender: F              HR:           85 bpm. Exam Location:  Inpatient Procedure: 2D Echo Indications:    atrial fibrillation  History:        Patient has no prior history of Echocardiogram examinations.                 Risk Factors:Hypertension.  Sonographer:    Johny Chess Referring Phys: [id number] [place]  1. Left ventricular ejection fraction, by estimation, is 60 to 65%. The left ventricle has normal function. The left ventricle has no regional wall motion abnormalities. Left ventricular diastolic parameters are consistent with age-related delayed relaxation (normal).  2. Right ventricular systolic function is normal. The right ventricular size is normal. There is normal pulmonary artery systolic pressure.  3. Left atrial size was mildly dilated.  4. The mitral valve is degenerative. Mild mitral valve regurgitation. No evidence of mitral stenosis.  5. The aortic valve is tricuspid. Aortic valve regurgitation is trivial. Mild to moderate aortic valve sclerosis/calcification is present, without any evidence of aortic stenosis.  6. The inferior vena cava is normal in size with greater than 50% respiratory variability, suggesting right atrial pressure of 3 mmHg. FINDINGS  Left Ventricle: Left ventricular ejection fraction, by estimation, is 60 to 65%. The left ventricle has normal function. The left ventricle has no regional wall motion abnormalities. The left  ventricular internal cavity size was normal in size. There is  no left ventricular hypertrophy. Left ventricular diastolic parameters are consistent with age-related delayed relaxation (normal). Right Ventricle: The right ventricular size is normal. No increase in right ventricular wall thickness. Right ventricular systolic function is normal. There is normal pulmonary artery systolic pressure. The tricuspid regurgitant velocity is 2.13 m/s, and  with an assumed right atrial pressure of 10 mmHg, the estimated right ventricular systolic pressure is 23.3 mmHg. Left Atrium: Left atrial size was mildly dilated. Right Atrium: Right atrial size  was normal in size. Pericardium: There is no evidence of pericardial effusion. Mitral Valve: The mitral valve is degenerative in appearance. There is moderate thickening of the mitral valve leaflet(s). There is moderate calcification of the mitral valve leaflet(s). Normal mobility of the mitral valve leaflets. Moderate mitral annular calcification. Mild mitral valve regurgitation. No evidence of mitral valve stenosis. Tricuspid Valve: The tricuspid valve is normal in structure. Tricuspid valve regurgitation is mild . No evidence of tricuspid stenosis. Aortic Valve: The aortic valve is tricuspid. Aortic valve regurgitation is trivial. Mild to moderate aortic valve sclerosis/calcification is present, without any evidence of aortic stenosis. Pulmonic Valve: The pulmonic valve was normal in structure. Pulmonic valve regurgitation is mild. No evidence of pulmonic stenosis. Aorta: The aortic root is normal in size and structure. Venous: The inferior vena cava is normal in size with greater than 50% respiratory variability, suggesting right atrial pressure of 3 mmHg. IAS/Shunts: No atrial level shunt detected by color flow Doppler.  LEFT VENTRICLE PLAX 2D LVIDd:         4.10 cm LVIDs:         3.00 cm LV PW:         0.80 cm LV IVS:        0.70 cm LVOT diam:     1.90 cm LV SV:         55 LV  SV Index:   29 LVOT Area:     2.84 cm  IVC IVC diam: 2.15 cm LEFT ATRIUM             Index       RIGHT ATRIUM           Index LA diam:        3.90 cm 2.01 cm/m  RA Area:     19.10 cm LA Vol (A2C):   79.9 ml 41.27 ml/m RA Volume:   50.30 ml  25.98 ml/m LA Vol (A4C):   84.0 ml 43.39 ml/m LA Biplane Vol: 85.2 ml 44.01 ml/m  AORTIC VALVE LVOT Vmax:   115.00 cm/s LVOT Vmean:  76.100 cm/s LVOT VTI:    0.195 m  AORTA Ao Root diam: 2.70 cm Ao Asc diam:  3.00 cm TRICUSPID VALVE TR Peak grad:   18.1 mmHg TR Vmax:        213.00 cm/s  SHUNTS Systemic VTI:  0.20 m Systemic Diam: 1.90 cm Jenkins Rouge MD Electronically signed by Jenkins Rouge MD Signature Date/Time: 07/29/2020/4:21:32 PM    Final    VAS [place] LOWER EXTREMITY VENOUS (DVT) (MC and WL 7a-7p)  Result Date: 07/25/2020  Lower Venous DVT Study Indications: Pain, and Swelling. Sepsis.  Risk Factors: Atrial fibrillation, therapeutic on Coumadin. Anticoagulation: Coumadin. Limitations: Patient's pain with touch and RN turned lights on in room in order to draw blood and start IV. Comparison Study: No prior study on file Performing Technologist: Sharion Dove RVS  Examination Guidelines: A complete evaluation includes B-mode imaging, spectral Doppler, color Doppler, and power Doppler as needed of all accessible portions of each vessel. Bilateral testing is considered an integral part of a complete examination. Limited examinations for reoccurring indications may be performed as noted. The reflux portion of the exam is performed with the patient in reverse Trendelenburg.  +-----+---------------+---------+-----------+----------+----------------------+ RIGHTCompressibilityPhasicitySpontaneityPropertiesThrombus Aging         +-----+---------------+---------+-----------+----------+----------------------+ CFV  patent by color and                                                      Doppler                 +-----+---------------+---------+-----------+----------+----------------------+   +---------+---------------+---------+-----------+----------+-------------------+ LEFT     CompressibilityPhasicitySpontaneityPropertiesThrombus Aging      +---------+---------------+---------+-----------+----------+-------------------+ CFV      Full           Yes      Yes                                      +---------+---------------+---------+-----------+----------+-------------------+ SFJ      Full                                                             +---------+---------------+---------+-----------+----------+-------------------+ FV Prox                                               patent by color and                                                       Doppler             +---------+---------------+---------+-----------+----------+-------------------+ FV Mid                                                patent by color and                                                       Doppler             +---------+---------------+---------+-----------+----------+-------------------+ FV Distal                                             patent by color and                                                       Doppler             +---------+---------------+---------+-----------+----------+-------------------+ POP  patent by color and                                                       Doppler             +---------+---------------+---------+-----------+----------+-------------------+ PTV      Full                                                             +---------+---------------+---------+-----------+----------+-------------------+ PERO     Full                                                             +---------+---------------+---------+-----------+----------+-------------------+      Summary: RIGHT: - No evidence of common femoral vein obstruction. pulsatile waveforms  LEFT: - There is no evidence of deep vein thrombosis in the lower extremity. However, portions of this examination were limited- see technologist comments above.  Pulsatile waveforms - Ultrasound characteristics of enlarged lymph nodes noted in the groin.  *See table(s) above for measurements and observations. Electronically signed by Servando Snare MD on 07/25/2020 at 7:41:09 PM.    Final     Labs: BNP (last 3 results) Recent Labs    07/28/20 1915  BNP 086.5*   Basic Metabolic Panel: Recent Labs  Lab 08/06/20 0415 08/07/20 0441 08/09/20 1053  NA 133* 136 136  K 4.1 4.8 4.2  CL 101 102 101  CO2 '25 25 25  ' GLUCOSE 127* 147* 174*  BUN '17 15 14  ' CREATININE 0.43* 0.55 0.52  CALCIUM 9.0 9.1 9.1   Liver Function Tests: Recent Labs  Lab 08/09/20 1053  AST 44*  ALT 45*  ALKPHOS 118  BILITOT 0.9  PROT 6.8  ALBUMIN 2.0*   No results for input(s): LIPASE, AMYLASE in the last 168 hours. No results for input(s): AMMONIA in the last 168 hours. CBC: Recent Labs  Lab 08/07/20 0441 08/09/20 1053  WBC 10.6* 8.8  NEUTROABS 7.3  --   HGB 11.5* 11.6*  HCT 35.4* 37.0  MCV 94.1 94.6  PLT 470* 516*   Cardiac Enzymes: No results for input(s): CKTOTAL, CKMB, CKMBINDEX, TROPONINI in the last 168 hours. BNP: Invalid input(s): POCBNP CBG: Recent Labs  Lab 08/08/20 1142 08/09/20 0808  GLUCAP 248* 121*   D-Dimer No results for input(s): DDIMER in the last 72 hours. Hgb A1c No results for input(s): HGBA1C in the last 72 hours. Lipid Profile No results for input(s): CHOL, HDL, LDLCALC, TRIG, CHOLHDL, LDLDIRECT in the last 72 hours. Thyroid function studies No results for input(s): TSH, T4TOTAL, T3FREE, THYROIDAB in the last 72 hours.  Invalid input(s): FREET3 Anemia work up No results for input(s): VITAMINB12, FOLATE, FERRITIN, TIBC, IRON, RETICCTPCT in the last 72 hours. Urinalysis      Component Value Date/Time   COLORURINE YELLOW 07/25/2020 1758   APPEARANCEUR CLEAR 07/25/2020 1758   LABSPEC 1.018 07/25/2020 1758   PHURINE 6.0 07/25/2020 1758   GLUCOSEU NEGATIVE 07/25/2020  [place] 07/25/2020 [place] 07/25/2020 [place] 07/25/2020 [place] NEGATIVE 07/25/2020 1758   NITRITE NEGATIVE 07/25/2020 1758   LEUKOCYTESUR NEGATIVE 07/25/2020 1758   Sepsis Labs Invalid input(s): PROCALCITONIN,  WBC,  LACTICIDVEN Microbiology No results found for this or any previous visit (from the past 240 hour(s)).   Time coordinating discharge: 35 minutes  SIGNED: Antonieta Pert, MD  [facility] 08/12/2020, 11:01 AM  If 7PM-7AM, please contact night-coverage [email or web address]

## 2020-08-11 NOTE — Progress Notes (Signed)
Physical Therapy Treatment Patient Details Name: [name] [name] MRN: [id number] DOB: 12/11/1930 Today's Date: 08/11/2020    History of Present Illness Pt is 86 year old female with a past medical history of atrial fibrillation on warfarin, chronic diastolic CHF, essential hypertension, hyperlipidemia and anxiety who comes in with left leg pain nausea vomiting and diarrhea.  She was found to be febrile.  Found to be tachycardic.  Noted to have erythema and swelling of the left flank.  Admitted for management of sepsis secondary to left lower extremity cellulitis.    PT Comments    Pt required decreased assistance for bed mobility and transfers today. Supine to sit was modified independent, with HOB up and use of rails. Sit to stand x 3. Stand pivot x 2 using stedy. Pt performed LLE exercises and tolerated them well, she reports she's been performing them independently. Pt is motivated and puts forth good effort. HR 120s at rest, 168 max with activity.   Follow Up Recommendations  CIR     Equipment Recommendations  None recommended by PT    Recommendations for Other Services Rehab consult     Precautions / Restrictions Precautions Precautions: Fall Precaution Comments: L LE pain; watch HR Restrictions Weight Bearing Restrictions: No    Mobility  Bed Mobility Overal bed mobility: Modified Independent Bed Mobility: Supine to Sit     Supine to sit: Modified independent (Device/Increase time);HOB elevated     General bed mobility comments: used rail, HOB up, no physical assist  Transfers Overall transfer level: Needs assistance Equipment used: Ambulation equipment used;Rolling walker (2 wheeled) Transfers: Sit to/from [facility] Sit to Stand: Mod assist Stand pivot transfers: Total assist       General transfer comment: sit to stand x 3 with mod A, in standing with RW pt was unable to weight shift to take steps so switched to [place]. Pt stands with flexed  posture, VCs to lift head and extend trunk; SPT with stedy x 2; HR 120s at rest, 168 max with activity  Ambulation/Gait                 Stairs             Wheelchair Mobility    Modified Rankin (Stroke Patients Only)       Balance Overall balance assessment: Needs assistance Sitting-balance support: Feet supported;Single extremity supported Sitting balance-Leahy Scale: Fair Sitting balance - Comments: kyphotic posture, VCs to lift head   Standing balance support: Bilateral upper extremity supported;Single extremity supported Standing balance-Leahy Scale: Poor Standing balance comment: relies on BUE support                            Cognition Arousal/Alertness: Awake/alert Behavior During Therapy: WFL for tasks assessed/performed Overall Cognitive Status: Within Functional Limits for tasks assessed                                 General Comments: daughter present      Exercises General Exercises - Lower Extremity Ankle Circles/Pumps: AROM;AAROM;Left;15 reps;Supine Quad Sets: AROM;Both;5 reps;Supine Gluteal Sets: AROM;Both;5 reps;Supine Heel Slides: AAROM;Left;10 reps;Supine Hip ABduction/ADduction: AAROM;Left;10 reps;Supine    General Comments        Pertinent Vitals/Pain Faces Pain Scale: Hurts little more Pain Location: LLE with weight bearing Pain Intervention(s): Limited activity within patient's tolerance;Monitored during session;Repositioned    Home Living  Prior Function            PT Goals (current goals can now be found in the care plan section) Acute Rehab PT Goals Patient Stated Goal: return to normal function PT Goal Formulation: With patient/family Time For Goal Achievement: 08/25/20 Potential to Achieve Goals: Good Progress towards PT goals: Progressing toward goals    Frequency    Min 3X/week      PT Plan Current plan remains appropriate    Co-evaluation               AM-PAC PT "6 Clicks" Mobility   Outcome Measure  Help needed turning from your back to your side while in a flat bed without using bedrails?: A Little Help needed moving from lying on your back to sitting on the side of a flat bed without using bedrails?: A Lot Help needed moving to and from a bed to a chair (including a wheelchair)?: A Lot Help needed standing up from a chair using your arms (e.g., wheelchair or bedside chair)?: A Lot Help needed to walk in hospital room?: Total Help needed climbing 3-5 steps with a railing? : Total 6 Click Score: 11    End of Session Equipment Utilized During Treatment: Gait belt Activity Tolerance: Patient tolerated treatment well Patient left: with call bell/phone within reach;in chair;with chair alarm set;with family/visitor present Nurse Communication: Mobility status;Need for lift equipment PT Visit Diagnosis: Muscle weakness (generalized) (M62.81);Pain Pain - Right/Left: Left Pain - part of body: Leg;Knee;Ankle and joints of foot     Time: 1118-1209 PT Time Calculation (min) (ACUTE ONLY): 51 min  Charges:  $Therapeutic Exercise: 8-22 mins $Therapeutic Activity: 23-37 mins                     Blondell Reveal Kistler PT 08/11/2020  Acute Rehabilitation Services Pager [phone] Office [phone]

## 2020-08-11 NOTE — TOC Progression Note (Addendum)
Transition of Care [facility]) - Progression Note    Patient Details  Name: [name] [name] MRN: [id number] Date of Birth: 20-Apr-1931  Transition of Care [facility] CM/SW [place], [place] Phone Number: 08/11/2020, 12:52 PM  Clinical Narrative:     CSW and Casemanager called Jace with [facility] Jace said there may be one open inpatient rehab bed tomorrow, that we can send over referral for them to review.CSW spoke with patients daughter Kathlee Nations and Kathlee Nations has agreed for CSW/Casemanager to send out initial referral for patient to [facility].   Pending bed offer.  CSW will continue to follow.  Pending bed offer    Barriers to Discharge: Continued Medical Work up  Expected Discharge Plan and Services                                                 Social Determinants of Health (SDOH) Interventions    Readmission Risk Interventions No flowsheet data found.

## 2020-08-11 NOTE — Progress Notes (Signed)
PROGRESS NOTE    [name] [name] [facility]  CVE:[id number] DOB: 1931/01/13 DOA: 07/25/2020 PCP: Hoyt Koch, MD   Chief Complaint  Patient presents with  . Leg Pain    Brief Narrative: AS PER hpi/chart:86 year old Caucasian female with a past medical history of permanent atrial fibrillation on warfarin, chronic diastolic CHF, essential hypertension, hyperlipidemia and chronic anxiety who comes in with left leg pain nausea vomiting and diarrhea.  Met SIRS criteria and noted to have left lower extremity cellulitis.  Started on empiric IV antibiotics.  TRH asked to admit for management of sepsis secondary to left lower extremity cellulitis.  Hospital course complicated by A. fib with RVR refractory to oral and IV beta-blocker.  Received a bolus of Cardizem infusion and was started on Cardizem drip.  Cardiology consulted on 07/29/2020 to assist with the management.  Heart rate improved with addition of Cardizem drip, switched to p.o. Cardizem on 2020-07-31. Infectious disease consulted at patient's daughter request, assisted with the management, has signed off. Patient also had mild transaminitis, abdominal ultrasound revealing suspicion of cirrhosis, no other acute finding seen by Gi. She is followed by PT OT, CIR has been recommended and patient and daughter wants to pursue CIR and waiting for inpatient rehab placement. 9/15: Dressing removed and examined overall much improved no erythema or tenderness edema improving..  Subjective:  Seen this morning no acute events overnight.  Heart rate remained stable Left lower extremity dressing removed and examined with improvement in her cellulitis Daughter at the bedside. Awaiting for placement  Assessment & Plan:  Severe Sepsis secondary to LLE cellulitis/strep group C bacteremia and left lower lobe community-acquired pneumonia: Sepsis POA with lactic acidosis tachycardia fever lower extremity cellulitis as a source. No DVT in duplex.Blood culture  8/29+ with Streptococcus group C.  Initially on IV vancomycin cefepime and flagyl in ED-then on ceftriaxone subsequently Ancef and then switched tp AMoxycillin 9/2/ to 9/13 completed course. Appears to have somewhat chronic edematous left lower extremity, continue daily dressing, redness swelling much better.  Left lower lobe community-acquired pneumonia POA-: Completed antibiotics, improved, procalcitonin normalized.  Continue incentive spirometry pulmonary toileting ambulation. Follow-up with chest x-ray outpatient 4 weeks  Cough after drinking water, resolved.  Seen by speech, tolerating regular diet.    Mild transaminitis: ast/alt at 44/45. ? etiology, ultrasound with chronic common bile duct dilatation, acute virus panel negative.Seen by GI.  Back on Lipitor 9/13, continue monitor LFTs intermittently   Permanent A. fib with RVR: rate is controlled even with PT session now.  Continue with Cardizem 240 Lopressor 50 twice daily, Eliquis- that was switched from Coumadin. tsh stable and echo Nl LVEF this admission.  Patient was seen by EP cardiology  Supratherapeutic INR resolved.  Now on Eliquis.  Acute hypoxic respiratory failure secondary pulmonary edema/pneumonia- seen on chest x-ray likely from fluid overload.  BNP around 300.  Resolved.  Continue p.o. Lasix  AKI: Resolved  Constipation continue as needed medication  Right upper lobe lung nodule need outpatient monitoring, incidental finding.  Hyperlipidemia LDL 59 goal less than 70.  Continue Lipitor check LFTs intermittently  Physical deconditioning/debility leg infection sepsis complex comorbidities and advanced age.  Patient is waiting for inpatient rehabilitation continue PT OT.   Oral thrush- diflucan x 5 days. Off   Antibiotics.  Is improved  Thrombocytosis likely stress response.  repeat CBC in few days.  Intermittently high blood sugar hba1c was 6.8, prediabetes-diabetes: We discussed about diet modification- daughter  worried that patient will have  limited  option w. Meal but they are watching her blood sugar.  I have counseled on low carbohydrate diet.  I have changed Ensure to Glucerna . Lab Results  Component Value Date   HGBA1C 6.8 (H) 07/31/2020    DVT prophylaxis: Eliquis Code Status:   Code Status: Full Code  Family Communication: plan of care discussed with patient.  Updated daughter at the bedside  Status is: Inpatient Remains inpatient appropriate because of further need of rehabilitation with CIR.  Awaiting on bed Dispo:Patient From: Home  Planned Disposition: Inpatient Rehab  Expected discharge date: once bed available at [facility]. patient has a bed at [facility] inpatient rehab tomorrow  Medically stable for discharge: yes Nutrition: Diet Order            Diet regular Room service appropriate? Yes; Fluid consistency: Thin  Diet effective now                 Body mass index is 29.07 kg/m. Consultants:see note  Procedures:see note Microbiology:see note Blood Culture    Component Value Date/Time   SDES BLOOD RIGHT WRIST 07/31/2020 1535   SDES BLOOD RIGHT HAND 07/31/2020 1535   SPECREQUEST  07/31/2020 1535    BOTTLES DRAWN AEROBIC ONLY Blood Culture results may not be optimal due to an inadequate volume of blood received in culture bottles   SPECREQUEST  07/31/2020 1535    BOTTLES DRAWN AEROBIC ONLY Blood Culture results may not be optimal due to an inadequate volume of blood received in culture bottles   CULT  07/31/2020 1535    NO GROWTH 5 DAYS Performed at [facility], [place] [address] [place], [place] [address]    CULT  07/31/2020 1535    NO GROWTH 5 DAYS Performed at [facility], [place] [address], [place], [place] [address]    REPTSTATUS 08/05/2020 FINAL 07/31/2020 1535   REPTSTATUS 08/05/2020 FINAL 07/31/2020 1535    Other culture-see note  Medications: Scheduled Meds: . apixaban  5 mg Oral BID  . atorvastatin  20 mg Oral q1800  . calcium-vitamin D  1 tablet  Oral QHS  . dextromethorphan-guaiFENesin  2 tablet Oral BID  . diltiazem  240 mg Oral Daily  . docusate sodium  100 mg Oral BID  . feeding supplement (GLUCERNA SHAKE)  237 mL Oral BID BM  . fluconazole  100 mg Oral Daily  . furosemide  40 mg Oral Daily  . ipratropium  0.5 mg Nebulization BID  . levalbuterol  0.63 mg Nebulization BID  . metoprolol tartrate  50 mg Oral BID  . mineral oil  1 enema Rectal Once  . multivitamin with minerals  1 tablet Oral Daily  . polyethylene glycol  17 g Oral Daily  . saccharomyces boulardii  250 mg Oral BID   Continuous Infusions:  Antimicrobials: Anti-infectives (From admission, onward)   Start     Dose/Rate Route Frequency Ordered Stop   08/09/20 1000  fluconazole (DIFLUCAN) tablet 100 mg        100 mg Oral Daily 08/09/20 0746 08/14/20 0959   08/07/20 1515  nystatin (MYCOSTATIN) tablet 500,000 Units  Status:  Discontinued        500,000 Units Oral Every 8 hours 08/07/20 1514 08/07/20 1614   07/29/20 1400  amoxicillin (AMOXIL) capsule 500 mg  Status:  Discontinued        500 mg Oral Every 8 hours 07/29/20 1140 08/09/20 1345   07/28/20 1400  ceFAZolin (ANCEF) IVPB 2g/100 mL premix  Status:  Discontinued        2 g 200 mL/hr over 30 Minutes Intravenous Every 8 hours 07/28/20 1249 07/29/20 1140   07/27/20 0600  cefTRIAXone (ROCEPHIN) 2 g in sodium chloride 0.9 % 100 mL IVPB  Status:  Discontinued        2 g 200 mL/hr over 30 Minutes Intravenous Every 24 hours 07/26/20 0842 07/26/20 1046   07/26/20 1800  cefTRIAXone (ROCEPHIN) 2 g in sodium chloride 0.9 % 100 mL IVPB  Status:  Discontinued        2 g 200 mL/hr over 30 Minutes Intravenous Every 24 hours 07/26/20 1046 07/28/20 1249   07/26/20 1700  vancomycin (VANCOCIN) IVPB 1000 mg/200 mL premix  Status:  Discontinued        1,000 mg 200 mL/hr over 60 Minutes Intravenous Every 24 hours 07/25/20 1553 07/26/20 0842   07/26/20 0600  vancomycin (VANCOREADY) IVPB 750 mg/150 mL  Status:  Discontinued          750 mg 150 mL/hr over 60 Minutes Intravenous Every 12 hours 07/25/20 1551 07/25/20 1553   07/26/20 0430  ceFEPIme (MAXIPIME) 2 g in sodium chloride 0.9 % 100 mL IVPB  Status:  Discontinued        2 g 200 mL/hr over 30 Minutes Intravenous Every 12 hours 07/25/20 1551 07/26/20 0842   07/25/20 1600  vancomycin (VANCOREADY) IVPB 1500 mg/300 mL        1,500 mg 150 mL/hr over 120 Minutes Intravenous  Once 07/25/20 1549 07/25/20 1838   07/25/20 1545  ceFEPIme (MAXIPIME) 2 g in sodium chloride 0.9 % 100 mL IVPB        2 g 200 mL/hr over 30 Minutes Intravenous  Once 07/25/20 1531 07/25/20 1622   07/25/20 1545  metroNIDAZOLE (FLAGYL) IVPB 500 mg        500 mg 100 mL/hr over 60 Minutes Intravenous  Once 07/25/20 1531 07/25/20 1729   07/25/20 1545  vancomycin (VANCOCIN) IVPB 1000 mg/200 mL premix  Status:  Discontinued        1,000 mg 200 mL/hr over 60 Minutes Intravenous  Once 07/25/20 1531 07/25/20 1549     Objective: Vitals: Today's Vitals   08/11/20 0729 08/11/20 0957 08/11/20 1258 08/11/20 1351  BP:  (!) 102/58  104/64  Pulse:  96 (!) 168 77  Resp:    16  Temp:    97.7 F (36.5 C)  TempSrc:    Oral  SpO2: 94% 94%  94%  Weight:      Height:      PainSc:        Intake/Output Summary (Last 24 hours) at 08/11/2020 1545 Last data filed at 08/10/2020 1900 Gross per 24 hour  Intake --  Output 500 ml  Net -500 ml   Filed Weights   08/02/20 0558 08/04/20 0620 08/09/20 0500  Weight: 76.4 kg 77.6 kg 76.8 kg   Weight change:   Intake/Output from previous day: 09/14 0701 - 09/15 0700 In: -  Out: 1200 [Urine:1200] Intake/Output this shift: No intake/output data recorded.  Examination: General exam: AAOx3, elderly frail, pleasant, NAD, weak appearing. HEENT:Oral mucosa moist, Ear/Nose WNL grossly, dentition normal. Respiratory system: bilaterally basal crackles,no wheezing,no use of accessory muscle Cardiovascular system: S1 & S2 +, No JVD,. Gastrointestinal system: Abdomen  soft, NT,ND, BS+ Nervous System:Alert, awake, moving extremities and grossly nonfocal Extremities: No edema on RLE, LLE with chronic edematous leg improving with mild erythema, see picture, distal peripheral pulses palpable.  Skin: No rashes,no  icterus. MSK: Normal muscle bulk,tone, power        Data Reviewed: I have personally reviewed following labs and imaging studies CBC: Recent Labs  Lab 08/05/20 0720 08/07/20 0441 08/09/20 1053  WBC 10.3 10.6* 8.8  NEUTROABS 7.5 7.3  --   HGB 11.4* 11.5* 11.6*  HCT 35.5* 35.4* 37.0  MCV 91.7 94.1 94.6  PLT 485* 470* 257*   Basic Metabolic Panel: Recent Labs  Lab 08/05/20 0720 08/06/20 0415 08/07/20 0441 08/09/20 1053  NA 136 133* 136 136  K 3.9 4.1 4.8 4.2  CL 101 101 102 101  CO2 '26 25 25 25  ' GLUCOSE 142* 127* 147* 174*  BUN '15 17 15 14  ' CREATININE 0.43* 0.43* 0.55 0.52  CALCIUM 8.8* 9.0 9.1 9.1   GFR: Estimated Creatinine Clearance: 50.6 mL/min (by C-G formula based on SCr of 0.52 mg/dL). Liver Function Tests: Recent Labs  Lab 08/05/20 0720 08/09/20 1053  AST 48* 44*  ALT 49* 45*  ALKPHOS 140* 118  BILITOT 1.1 0.9  PROT 6.2* 6.8  ALBUMIN 1.7* 2.0*   No results for input(s): LIPASE, AMYLASE in the last 168 hours. No results for input(s): AMMONIA in the last 168 hours. Coagulation Profile: Recent Labs  Lab 08/05/20 0720 08/06/20 0937 08/07/20 0441  INR 2.5* 1.9* 2.1*   Cardiac Enzymes: No results for input(s): CKTOTAL, CKMB, CKMBINDEX, TROPONINI in the last 168 hours. BNP (last 3 results) No results for input(s): PROBNP in the last 8760 hours. HbA1C: No results for input(s): HGBA1C in the last 72 hours. CBG: Recent Labs  Lab 08/08/20 1142 08/09/20 0808  GLUCAP 248* 121*   Lipid Profile: No results for input(s): CHOL, HDL, LDLCALC, TRIG, CHOLHDL, LDLDIRECT in the last 72 hours. Thyroid Function Tests: No results for input(s): TSH, T4TOTAL, FREET4, T3FREE, THYROIDAB in the last 72 hours. Anemia  Panel: No results for input(s): VITAMINB12, FOLATE, FERRITIN, TIBC, IRON, RETICCTPCT in the last 72 hours. Sepsis Labs: No results for input(s): PROCALCITON, LATICACIDVEN in the last 168 hours.  No results found for this or any previous visit (from the past 240 hour(s)).   Radiology Studies: No results found.   LOS: 88 days   Antonieta Pert, MD [facility]  08/11/2020, 3:45 PM

## 2020-08-11 NOTE — Progress Notes (Signed)
Nutrition Follow-up  DOCUMENTATION CODES:   Not applicable  INTERVENTION:    Continue Glucerna Shake po BID, each supplement provides 220 kcal and 10 grams of protein  Continue MVI with minerals daily  Encourage intake of meals and supplements  NUTRITION DIAGNOSIS:   Increased nutrient needs related to wound healing as evidenced by estimated needs.  Ongoing  GOAL:   Patient will meet greater than or equal to 90% of their needs  Progressing   MONITOR:   PO intake, Supplement acceptance, Skin  ASSESSMENT:   86 yo female admitted with sepsis r/t LLE cellulitis, strep group C bacteremia, LLL CAP. PMH includes A fib, CHF, HTN, HLD, anxiety.   PO intake has improved since last week. Patient is now eating 50-100% of meals.  She is also being offered Glucerna Shake BID between meals.   Plans for d/c to [facility] tomorrow.  Labs reviewed.  CBG: 248-121  Medications reviewed and include Oscal with D, Colace, Lasix, MVI with minerals, Miralax, Florastor.  Per RN documentation, edema has improved. Most recent weight 76.8 kg on 9/13, trending down, but remains above usual weight.   Diet Order:   Diet Order            Diet regular Room service appropriate? Yes; Fluid consistency: Thin  Diet effective now                 EDUCATION NEEDS:   Not appropriate for education at this time  Skin: LLE cellulitis, MASD to buttocks  Last BM:  9/13  Height:   Ht Readings from Last 1 Encounters:  07/26/20 5\' 4"  (1.626 m)    Weight:   Wt Readings from Last 1 Encounters:  08/09/20 76.8 kg    Ideal Body Weight:  54.5 kg  BMI:  Body mass index is 29.07 kg/m.  Estimated Nutritional Needs:   Kcal:  1500-1700  Protein:  75-85 gm  Fluid:  1.5-1.7 L    Lucas Mallow, RD, LDN, CNSC Please refer to Amion for contact information.

## 2020-08-11 NOTE — Progress Notes (Signed)
Occupational Therapy Treatment Patient Details Name: [name] [name] [facility] MRN: [id number] DOB: 04/06/1931 Today's Date: 08/11/2020    History of present illness Pt is 86 year old female with a past medical history of atrial fibrillation on warfarin, chronic diastolic CHF, essential hypertension, hyperlipidemia and anxiety who comes in with left leg pain nausea vomiting and diarrhea.  She was found to be febrile.  Found to be tachycardic.  Noted to have erythema and swelling of the left flank.  Admitted for management of sepsis secondary to left lower extremity cellulitis.   OT comments  Pt received sitting up in chair with daughter at bed, reports "needing to use the restroom". Pt reports some pain of LLE, but agreeable to use stedy for transfer from chair to bed with Max A+2 to power up to stand on steady and VC's for hand placement and sequencing for safe toileting. Max A toilet hygiene. Pt presented back to bed from [facility] with stedy, Max A+2 sit to supine with assist to present BLE into bed. Pt slowly progressing and will benefit from continued OT to addressed established deficits to maximize independence prior to dc setting.    Follow Up Recommendations  SNF;Supervision/Assistance - 24 hour    Equipment Recommendations  Tub/shower seat    Recommendations for Other Services      Precautions / Restrictions Precautions Precautions: Fall Precaution Comments: L LE pain; watch HR       Mobility Bed Mobility Overal bed mobility: Modified Independent Bed Mobility: Sit to Supine       Sit to supine: Max assist;+2 for physical assistance;+2 for safety/equipment   General bed mobility comments: assist to present BLE to [facility]  Transfers Overall transfer level: Needs assistance Equipment used: Ambulation equipment used Transfers: Sit to/from [facility] Sit to Stand: Max assist;+2 physical assistance;+2 safety/equipment         General transfer comment: stedy used during  session with BSC transfer for toileting.    Balance Overall balance assessment: Needs assistance Sitting-balance support: Feet supported;Single extremity supported Sitting balance-Leahy Scale: Fair Sitting balance - Comments: kyphotic posture, VCs to lift head   Standing balance support: Bilateral upper extremity supported;Single extremity supported Standing balance-Leahy Scale: Poor Standing balance comment: relies on BUE support                           ADL either performed or assessed with clinical judgement   ADL Overall ADL's : Needs assistance/impaired                         Toilet Transfer: Maximal assistance;+2 for safety/equipment;Cueing for safety;Cueing for sequencing;BSC   Toileting- Clothing Manipulation and Hygiene: Total assistance Toileting - Clothing Manipulation Details (indicate cue type and reason): pt transferred to [facility] with +2 assist and use of stedy, VC's for sequencing and hand placement. Pt limited with placing weight on LLE             Vision   Vision Assessment?: No apparent visual deficits   Perception     Praxis      Cognition Arousal/Alertness: Awake/alert Behavior During Therapy: WFL for tasks assessed/performed Overall Cognitive Status: Within Functional Limits for tasks assessed                                 General Comments: daughter present        Exercises General  Exercises - Lower Extremity Ankle Circles/Pumps: AROM;AAROM;Left;15 reps;Supine Quad Sets: AROM;Both;5 reps;Supine Gluteal Sets: AROM;Both;5 reps;Supine Heel Slides: AAROM;Left;10 reps;Supine Hip ABduction/ADduction: AAROM;Left;10 reps;Supine   Shoulder Instructions       General Comments Pt HR from 87-101 bpm throughout session.    Pertinent Vitals/ Pain       Pain Assessment: Faces Faces Pain Scale: Hurts a little bit Pain Location: LLE with weight bearing Pain Descriptors / Indicators: Discomfort;Sore (reports  tolerable) Pain Intervention(s): Limited activity within patient's tolerance;Monitored during session;Repositioned  Home Living                                          Prior Functioning/Environment              Frequency  Min 2X/week        Progress Toward Goals  OT Goals(current goals can now be found in the care plan section)  Progress towards OT goals: Progressing toward goals  Acute Rehab OT Goals Patient Stated Goal: return to normal function OT Goal Formulation: With patient/family Time For Goal Achievement: 08/10/20 Potential to Achieve Goals: Good ADL Goals Pt Will Perform Lower Body Bathing: with mod assist;sit to/from stand;sitting/lateral leans Pt Will Perform Lower Body Dressing: with mod assist;sit to/from stand;sitting/lateral leans Additional ADL Goal #1: Pt to demonstrate ability to complete sit to stand transfer at Mod A in prep for ADL transfers  Plan Discharge plan remains appropriate (on bedpan)    Co-evaluation                 AM-PAC OT "6 Clicks" Daily Activity     Outcome Measure   Help from another person eating meals?: A Little Help from another person taking care of personal grooming?: A Little Help from another person toileting, which includes using toliet, bedpan, or urinal?: Total Help from another person bathing (including washing, rinsing, drying)?: Total Help from another person to put on and taking off regular upper body clothing?: A Little Help from another person to put on and taking off regular lower body clothing?: Total 6 Click Score: 12    End of Session Equipment Utilized During Treatment: Oxygen  OT Visit Diagnosis: Unsteadiness on feet (R26.81);Other abnormalities of gait and mobility (R26.89);Muscle weakness (generalized) (M62.81);History of falling (Z91.81);Pain Pain - Right/Left: Left Pain - part of body: Leg   Activity Tolerance Patient limited by pain;Patient tolerated treatment well    Patient Left in bed;with call bell/phone within reach;with family/visitor present   Nurse Communication Mobility status        Time: 0177-9390 OT Time Calculation (min): 37 min  Charges: OT General Charges $OT Visit: 1 Visit OT Treatments $Self Care/Home Management : 23-37 mins  Minus Breeding, MSOT, OTR/L  Supplemental Rehabilitation Services  [phone]    Marius Ditch 08/11/2020, 5:02 PM

## 2020-08-12 DIAGNOSIS — M7732 Calcaneal spur, left foot: Secondary | ICD-10-CM | POA: Diagnosis not present

## 2020-08-12 DIAGNOSIS — R197 Diarrhea, unspecified: Secondary | ICD-10-CM | POA: Diagnosis not present

## 2020-08-12 DIAGNOSIS — I4891 Unspecified atrial fibrillation: Secondary | ICD-10-CM | POA: Diagnosis not present

## 2020-08-12 DIAGNOSIS — J9601 Acute respiratory failure with hypoxia: Secondary | ICD-10-CM | POA: Diagnosis not present

## 2020-08-12 DIAGNOSIS — M2550 Pain in unspecified joint: Secondary | ICD-10-CM | POA: Diagnosis not present

## 2020-08-12 DIAGNOSIS — J9600 Acute respiratory failure, unspecified whether with hypoxia or hypercapnia: Secondary | ICD-10-CM | POA: Diagnosis not present

## 2020-08-12 DIAGNOSIS — E785 Hyperlipidemia, unspecified: Secondary | ICD-10-CM | POA: Diagnosis not present

## 2020-08-12 DIAGNOSIS — M79672 Pain in left foot: Secondary | ICD-10-CM | POA: Diagnosis not present

## 2020-08-12 DIAGNOSIS — I4821 Permanent atrial fibrillation: Secondary | ICD-10-CM | POA: Diagnosis not present

## 2020-08-12 DIAGNOSIS — Z7409 Other reduced mobility: Secondary | ICD-10-CM | POA: Diagnosis not present

## 2020-08-12 DIAGNOSIS — A419 Sepsis, unspecified organism: Secondary | ICD-10-CM | POA: Diagnosis not present

## 2020-08-12 DIAGNOSIS — I10 Essential (primary) hypertension: Secondary | ICD-10-CM | POA: Diagnosis not present

## 2020-08-12 DIAGNOSIS — J189 Pneumonia, unspecified organism: Secondary | ICD-10-CM | POA: Diagnosis not present

## 2020-08-12 DIAGNOSIS — M79605 Pain in left leg: Secondary | ICD-10-CM | POA: Diagnosis not present

## 2020-08-12 DIAGNOSIS — I110 Hypertensive heart disease with heart failure: Secondary | ICD-10-CM | POA: Diagnosis not present

## 2020-08-12 DIAGNOSIS — M7989 Other specified soft tissue disorders: Secondary | ICD-10-CM | POA: Diagnosis not present

## 2020-08-12 DIAGNOSIS — I482 Chronic atrial fibrillation, unspecified: Secondary | ICD-10-CM | POA: Diagnosis not present

## 2020-08-12 DIAGNOSIS — M19072 Primary osteoarthritis, left ankle and foot: Secondary | ICD-10-CM | POA: Diagnosis not present

## 2020-08-12 DIAGNOSIS — I5032 Chronic diastolic (congestive) heart failure: Secondary | ICD-10-CM | POA: Diagnosis not present

## 2020-08-12 DIAGNOSIS — G7281 Critical illness myopathy: Secondary | ICD-10-CM | POA: Diagnosis not present

## 2020-08-12 DIAGNOSIS — Z7401 Bed confinement status: Secondary | ICD-10-CM | POA: Diagnosis not present

## 2020-08-12 DIAGNOSIS — Z743 Need for continuous supervision: Secondary | ICD-10-CM | POA: Diagnosis not present

## 2020-08-12 DIAGNOSIS — L719 Rosacea, unspecified: Secondary | ICD-10-CM | POA: Diagnosis not present

## 2020-08-12 DIAGNOSIS — R5381 Other malaise: Secondary | ICD-10-CM | POA: Diagnosis not present

## 2020-08-12 DIAGNOSIS — I499 Cardiac arrhythmia, unspecified: Secondary | ICD-10-CM | POA: Diagnosis not present

## 2020-08-12 DIAGNOSIS — M5430 Sciatica, unspecified side: Secondary | ICD-10-CM | POA: Diagnosis not present

## 2020-08-12 DIAGNOSIS — R7881 Bacteremia: Secondary | ICD-10-CM | POA: Diagnosis not present

## 2020-08-12 DIAGNOSIS — L0390 Cellulitis, unspecified: Secondary | ICD-10-CM | POA: Diagnosis not present

## 2020-08-12 DIAGNOSIS — Z7901 Long term (current) use of anticoagulants: Secondary | ICD-10-CM | POA: Diagnosis not present

## 2020-08-12 DIAGNOSIS — L03116 Cellulitis of left lower limb: Secondary | ICD-10-CM | POA: Diagnosis not present

## 2020-08-12 DIAGNOSIS — L03119 Cellulitis of unspecified part of limb: Secondary | ICD-10-CM | POA: Diagnosis not present

## 2020-08-12 MED ORDER — POLYETHYLENE GLYCOL 3350 17 G PO PACK: 17.0000 g | PACK | Freq: Every day | ORAL | 0 refills | Status: DC

## 2020-08-12 MED ORDER — GLUCERNA SHAKE PO LIQD: 237.0000 mL | Freq: Two times a day (BID) | ORAL | 0 refills | Status: AC

## 2020-08-12 MED ORDER — APIXABAN 5 MG PO TABS
5.0000 mg | ORAL_TABLET | Freq: Two times a day (BID) | ORAL | Status: DC
Start: 2020-08-12 — End: 2020-10-01

## 2020-08-12 MED ORDER — SACCHAROMYCES BOULARDII 250 MG PO CAPS: 250.0000 mg | ORAL_CAPSULE | Freq: Two times a day (BID) | ORAL | 0 refills | Status: AC

## 2020-08-12 MED ORDER — METOPROLOL TARTRATE 50 MG PO TABS
50.0000 mg | ORAL_TABLET | Freq: Two times a day (BID) | ORAL | Status: DC
Start: 2020-08-12 — End: 2020-10-06

## 2020-08-12 MED ORDER — DILTIAZEM HCL ER COATED BEADS 240 MG PO CP24: 240.0000 mg | ORAL_CAPSULE | Freq: Every day | ORAL | Status: DC

## 2020-08-12 MED ORDER — BISACODYL 10 MG RE SUPP: 10.0000 mg | Freq: Every day | RECTAL | 0 refills | Status: DC | PRN

## 2020-08-12 MED ORDER — DM-GUAIFENESIN ER 30-600 MG PO TB12: 2.0000 | ORAL_TABLET | Freq: Two times a day (BID) | ORAL | Status: DC | PRN

## 2020-08-12 MED ORDER — DOCUSATE SODIUM 100 MG PO CAPS: 100.0000 mg | ORAL_CAPSULE | Freq: Two times a day (BID) | ORAL | 0 refills | Status: AC

## 2020-08-12 NOTE — Progress Notes (Signed)
Inpatient Rehab Admissions Coordinator:   I met with patient and family member at bedside. Pt. Has a bed offer at [facility] in [place] with plans to d/c there today. Pt. And daughter confirm this plan. CIR will sign off.   Clemens Catholic, [place], [place] Admissions Coordinator  [phone] ([place]) [phone] (office)

## 2020-08-12 NOTE — TOC Transition Note (Addendum)
Transition of Care [facility]) - CM/SW Discharge Note   Patient Details  Name: NIREL BABLER MRN: [id number] Date of Birth: Dec 16, 1930  [facility] CM/SW Contact:  Bethena Roys, RN Phone Number: 08/12/2020, 10:37 AM   Clinical Narrative:  Plan will be to transition patient to [facility] in [place]. Staff RN to call report to [phone] with report. The admitting physician is Leandro Reasoner. Pt will choose her room when she arrives. Once d/c order is in [place] will be called for transport.   08-12-20 1251 Late Entry: PTAR scheduled for 1300. Daughter in room and Osborne Oman is aware of ETA..    Final next level of care: IP Rehab Facility Barriers to Discharge: No Barriers Identified   Patient Goals and CMS Choice Patient states their goals for this hospitalization and ongoing recovery are:: to go to CIR CMS Medicare.gov Compare Post Acute Care list provided to:: Patient Represenative (must comment) (daughter Kathlee Nations) Choice offered to / list presented to : Adult Children Kathlee Nations)      Readmission Risk Interventions No flowsheet data found.

## 2020-08-12 NOTE — Progress Notes (Signed)
Report given to [place], Therapist, sports. Patient is alert and oriented x4 resting in bed with call light in reach. A fib on tele. No distress noted.  Patient being [place] to [facility] today.

## 2020-08-13 DIAGNOSIS — A419 Sepsis, unspecified organism: Secondary | ICD-10-CM | POA: Diagnosis not present

## 2020-08-13 DIAGNOSIS — J9600 Acute respiratory failure, unspecified whether with hypoxia or hypercapnia: Secondary | ICD-10-CM | POA: Diagnosis not present

## 2020-08-13 DIAGNOSIS — J189 Pneumonia, unspecified organism: Secondary | ICD-10-CM | POA: Diagnosis not present

## 2020-08-13 DIAGNOSIS — I4891 Unspecified atrial fibrillation: Secondary | ICD-10-CM | POA: Diagnosis not present

## 2020-08-14 DIAGNOSIS — I4891 Unspecified atrial fibrillation: Secondary | ICD-10-CM | POA: Diagnosis not present

## 2020-08-14 DIAGNOSIS — J189 Pneumonia, unspecified organism: Secondary | ICD-10-CM | POA: Diagnosis not present

## 2020-08-14 DIAGNOSIS — Z7409 Other reduced mobility: Secondary | ICD-10-CM | POA: Diagnosis not present

## 2020-08-14 DIAGNOSIS — J9601 Acute respiratory failure with hypoxia: Secondary | ICD-10-CM | POA: Diagnosis not present

## 2020-08-15 DIAGNOSIS — J189 Pneumonia, unspecified organism: Secondary | ICD-10-CM | POA: Diagnosis not present

## 2020-08-15 DIAGNOSIS — Z7409 Other reduced mobility: Secondary | ICD-10-CM | POA: Diagnosis not present

## 2020-08-15 DIAGNOSIS — J9601 Acute respiratory failure with hypoxia: Secondary | ICD-10-CM | POA: Diagnosis not present

## 2020-08-15 DIAGNOSIS — I4891 Unspecified atrial fibrillation: Secondary | ICD-10-CM | POA: Diagnosis not present

## 2020-08-16 DIAGNOSIS — J9600 Acute respiratory failure, unspecified whether with hypoxia or hypercapnia: Secondary | ICD-10-CM | POA: Diagnosis not present

## 2020-08-16 DIAGNOSIS — I4891 Unspecified atrial fibrillation: Secondary | ICD-10-CM | POA: Diagnosis not present

## 2020-08-16 DIAGNOSIS — A419 Sepsis, unspecified organism: Secondary | ICD-10-CM | POA: Diagnosis not present

## 2020-08-16 DIAGNOSIS — J189 Pneumonia, unspecified organism: Secondary | ICD-10-CM | POA: Diagnosis not present

## 2020-08-17 DIAGNOSIS — A419 Sepsis, unspecified organism: Secondary | ICD-10-CM | POA: Diagnosis not present

## 2020-08-17 DIAGNOSIS — J9600 Acute respiratory failure, unspecified whether with hypoxia or hypercapnia: Secondary | ICD-10-CM | POA: Diagnosis not present

## 2020-08-17 DIAGNOSIS — J189 Pneumonia, unspecified organism: Secondary | ICD-10-CM | POA: Diagnosis not present

## 2020-08-17 DIAGNOSIS — I4891 Unspecified atrial fibrillation: Secondary | ICD-10-CM | POA: Diagnosis not present

## 2020-08-18 DIAGNOSIS — I4891 Unspecified atrial fibrillation: Secondary | ICD-10-CM | POA: Diagnosis not present

## 2020-08-18 DIAGNOSIS — A419 Sepsis, unspecified organism: Secondary | ICD-10-CM | POA: Diagnosis not present

## 2020-08-18 DIAGNOSIS — J9600 Acute respiratory failure, unspecified whether with hypoxia or hypercapnia: Secondary | ICD-10-CM | POA: Diagnosis not present

## 2020-08-18 DIAGNOSIS — J189 Pneumonia, unspecified organism: Secondary | ICD-10-CM | POA: Diagnosis not present

## 2020-08-19 DIAGNOSIS — A419 Sepsis, unspecified organism: Secondary | ICD-10-CM | POA: Diagnosis not present

## 2020-08-19 DIAGNOSIS — J189 Pneumonia, unspecified organism: Secondary | ICD-10-CM | POA: Diagnosis not present

## 2020-08-19 DIAGNOSIS — J9600 Acute respiratory failure, unspecified whether with hypoxia or hypercapnia: Secondary | ICD-10-CM | POA: Diagnosis not present

## 2020-08-19 DIAGNOSIS — I4891 Unspecified atrial fibrillation: Secondary | ICD-10-CM | POA: Diagnosis not present

## 2020-08-20 ENCOUNTER — Other Ambulatory Visit: Payer: Self-pay | Admitting: Internal Medicine

## 2020-08-20 DIAGNOSIS — I4891 Unspecified atrial fibrillation: Secondary | ICD-10-CM | POA: Diagnosis not present

## 2020-08-20 DIAGNOSIS — A419 Sepsis, unspecified organism: Secondary | ICD-10-CM | POA: Diagnosis not present

## 2020-08-20 DIAGNOSIS — J9600 Acute respiratory failure, unspecified whether with hypoxia or hypercapnia: Secondary | ICD-10-CM | POA: Diagnosis not present

## 2020-08-20 DIAGNOSIS — J189 Pneumonia, unspecified organism: Secondary | ICD-10-CM | POA: Diagnosis not present

## 2020-08-22 DIAGNOSIS — J9600 Acute respiratory failure, unspecified whether with hypoxia or hypercapnia: Secondary | ICD-10-CM | POA: Diagnosis not present

## 2020-08-22 DIAGNOSIS — A419 Sepsis, unspecified organism: Secondary | ICD-10-CM | POA: Diagnosis not present

## 2020-08-22 DIAGNOSIS — J189 Pneumonia, unspecified organism: Secondary | ICD-10-CM | POA: Diagnosis not present

## 2020-08-22 DIAGNOSIS — I4891 Unspecified atrial fibrillation: Secondary | ICD-10-CM | POA: Diagnosis not present

## 2020-08-23 DIAGNOSIS — I4891 Unspecified atrial fibrillation: Secondary | ICD-10-CM | POA: Diagnosis not present

## 2020-08-23 DIAGNOSIS — A419 Sepsis, unspecified organism: Secondary | ICD-10-CM | POA: Diagnosis not present

## 2020-08-23 DIAGNOSIS — J9600 Acute respiratory failure, unspecified whether with hypoxia or hypercapnia: Secondary | ICD-10-CM | POA: Diagnosis not present

## 2020-08-23 DIAGNOSIS — J189 Pneumonia, unspecified organism: Secondary | ICD-10-CM | POA: Diagnosis not present

## 2020-08-26 DIAGNOSIS — J9600 Acute respiratory failure, unspecified whether with hypoxia or hypercapnia: Secondary | ICD-10-CM | POA: Diagnosis not present

## 2020-08-26 DIAGNOSIS — A419 Sepsis, unspecified organism: Secondary | ICD-10-CM | POA: Diagnosis not present

## 2020-08-26 DIAGNOSIS — J189 Pneumonia, unspecified organism: Secondary | ICD-10-CM | POA: Diagnosis not present

## 2020-08-26 DIAGNOSIS — I4891 Unspecified atrial fibrillation: Secondary | ICD-10-CM | POA: Diagnosis not present

## 2020-08-27 ENCOUNTER — Ambulatory Visit: Payer: Medicare Other | Admitting: Student

## 2020-08-27 DIAGNOSIS — J189 Pneumonia, unspecified organism: Secondary | ICD-10-CM | POA: Diagnosis not present

## 2020-08-27 DIAGNOSIS — J9600 Acute respiratory failure, unspecified whether with hypoxia or hypercapnia: Secondary | ICD-10-CM | POA: Diagnosis not present

## 2020-08-27 DIAGNOSIS — A419 Sepsis, unspecified organism: Secondary | ICD-10-CM | POA: Diagnosis not present

## 2020-08-27 DIAGNOSIS — I4891 Unspecified atrial fibrillation: Secondary | ICD-10-CM | POA: Diagnosis not present

## 2020-08-30 ENCOUNTER — Inpatient Hospital Stay: Payer: Medicare Other | Admitting: Internal Medicine

## 2020-08-30 DIAGNOSIS — I4891 Unspecified atrial fibrillation: Secondary | ICD-10-CM | POA: Diagnosis not present

## 2020-08-30 DIAGNOSIS — J9600 Acute respiratory failure, unspecified whether with hypoxia or hypercapnia: Secondary | ICD-10-CM | POA: Diagnosis not present

## 2020-08-30 DIAGNOSIS — A419 Sepsis, unspecified organism: Secondary | ICD-10-CM | POA: Diagnosis not present

## 2020-08-30 DIAGNOSIS — J189 Pneumonia, unspecified organism: Secondary | ICD-10-CM | POA: Diagnosis not present

## 2020-08-31 DIAGNOSIS — I4891 Unspecified atrial fibrillation: Secondary | ICD-10-CM | POA: Diagnosis not present

## 2020-08-31 DIAGNOSIS — J9600 Acute respiratory failure, unspecified whether with hypoxia or hypercapnia: Secondary | ICD-10-CM | POA: Diagnosis not present

## 2020-08-31 DIAGNOSIS — J189 Pneumonia, unspecified organism: Secondary | ICD-10-CM | POA: Diagnosis not present

## 2020-08-31 DIAGNOSIS — A419 Sepsis, unspecified organism: Secondary | ICD-10-CM | POA: Diagnosis not present

## 2020-09-01 DIAGNOSIS — J189 Pneumonia, unspecified organism: Secondary | ICD-10-CM | POA: Diagnosis not present

## 2020-09-01 DIAGNOSIS — I4891 Unspecified atrial fibrillation: Secondary | ICD-10-CM | POA: Diagnosis not present

## 2020-09-01 DIAGNOSIS — J9600 Acute respiratory failure, unspecified whether with hypoxia or hypercapnia: Secondary | ICD-10-CM | POA: Diagnosis not present

## 2020-09-01 DIAGNOSIS — A419 Sepsis, unspecified organism: Secondary | ICD-10-CM | POA: Diagnosis not present

## 2020-09-06 DIAGNOSIS — M7989 Other specified soft tissue disorders: Secondary | ICD-10-CM | POA: Diagnosis not present

## 2020-09-06 DIAGNOSIS — M79672 Pain in left foot: Secondary | ICD-10-CM | POA: Diagnosis not present

## 2020-09-06 DIAGNOSIS — M7732 Calcaneal spur, left foot: Secondary | ICD-10-CM | POA: Diagnosis not present

## 2020-09-07 ENCOUNTER — Ambulatory Visit: Payer: Self-pay | Admitting: General Practice

## 2020-09-07 ENCOUNTER — Inpatient Hospital Stay: Payer: Medicare Other | Admitting: Internal Medicine

## 2020-09-07 DIAGNOSIS — A419 Sepsis, unspecified organism: Secondary | ICD-10-CM | POA: Diagnosis not present

## 2020-09-07 DIAGNOSIS — J9600 Acute respiratory failure, unspecified whether with hypoxia or hypercapnia: Secondary | ICD-10-CM | POA: Diagnosis not present

## 2020-09-07 DIAGNOSIS — I4891 Unspecified atrial fibrillation: Secondary | ICD-10-CM | POA: Diagnosis not present

## 2020-09-07 DIAGNOSIS — J189 Pneumonia, unspecified organism: Secondary | ICD-10-CM | POA: Diagnosis not present

## 2020-09-08 DIAGNOSIS — A419 Sepsis, unspecified organism: Secondary | ICD-10-CM | POA: Diagnosis not present

## 2020-09-08 DIAGNOSIS — J9600 Acute respiratory failure, unspecified whether with hypoxia or hypercapnia: Secondary | ICD-10-CM | POA: Diagnosis not present

## 2020-09-08 DIAGNOSIS — J189 Pneumonia, unspecified organism: Secondary | ICD-10-CM | POA: Diagnosis not present

## 2020-09-08 DIAGNOSIS — G7281 Critical illness myopathy: Secondary | ICD-10-CM | POA: Diagnosis not present

## 2020-09-08 DIAGNOSIS — I4891 Unspecified atrial fibrillation: Secondary | ICD-10-CM | POA: Diagnosis not present

## 2020-09-08 DIAGNOSIS — L03119 Cellulitis of unspecified part of limb: Secondary | ICD-10-CM | POA: Diagnosis not present

## 2020-09-09 DIAGNOSIS — I4891 Unspecified atrial fibrillation: Secondary | ICD-10-CM | POA: Diagnosis not present

## 2020-09-09 DIAGNOSIS — J189 Pneumonia, unspecified organism: Secondary | ICD-10-CM | POA: Diagnosis not present

## 2020-09-09 DIAGNOSIS — J9600 Acute respiratory failure, unspecified whether with hypoxia or hypercapnia: Secondary | ICD-10-CM | POA: Diagnosis not present

## 2020-09-09 DIAGNOSIS — A419 Sepsis, unspecified organism: Secondary | ICD-10-CM | POA: Diagnosis not present

## 2020-09-10 ENCOUNTER — Ambulatory Visit: Payer: Medicare Other | Admitting: Nurse Practitioner

## 2020-09-10 DIAGNOSIS — J189 Pneumonia, unspecified organism: Secondary | ICD-10-CM | POA: Diagnosis not present

## 2020-09-10 DIAGNOSIS — I4891 Unspecified atrial fibrillation: Secondary | ICD-10-CM | POA: Diagnosis not present

## 2020-09-10 DIAGNOSIS — A419 Sepsis, unspecified organism: Secondary | ICD-10-CM | POA: Diagnosis not present

## 2020-09-10 DIAGNOSIS — J9600 Acute respiratory failure, unspecified whether with hypoxia or hypercapnia: Secondary | ICD-10-CM | POA: Diagnosis not present

## 2020-09-11 DIAGNOSIS — J189 Pneumonia, unspecified organism: Secondary | ICD-10-CM | POA: Diagnosis not present

## 2020-09-11 DIAGNOSIS — I4891 Unspecified atrial fibrillation: Secondary | ICD-10-CM | POA: Diagnosis not present

## 2020-09-11 DIAGNOSIS — J9600 Acute respiratory failure, unspecified whether with hypoxia or hypercapnia: Secondary | ICD-10-CM | POA: Diagnosis not present

## 2020-09-11 DIAGNOSIS — A419 Sepsis, unspecified organism: Secondary | ICD-10-CM | POA: Diagnosis not present

## 2020-09-13 ENCOUNTER — Inpatient Hospital Stay: Payer: Medicare Other | Admitting: Internal Medicine

## 2020-09-17 ENCOUNTER — Inpatient Hospital Stay: Payer: Medicare Other | Admitting: Internal Medicine

## 2020-09-21 ENCOUNTER — Ambulatory Visit: Payer: Medicare Other | Admitting: Student

## 2020-09-21 DIAGNOSIS — L03116 Cellulitis of left lower limb: Secondary | ICD-10-CM | POA: Diagnosis not present

## 2020-09-23 ENCOUNTER — Telehealth: Payer: Self-pay | Admitting: Internal Medicine

## 2020-09-23 NOTE — Telephone Encounter (Signed)
Discharged today from [facility] rehab hospital in [place], ([facility])  Verbal order that Dr. Quay Burow will follow for home health for this patient  Please call Larene Beach at [phone]

## 2020-09-23 NOTE — Telephone Encounter (Signed)
Spoke with Larene Beach and info given.

## 2020-09-23 NOTE — Telephone Encounter (Signed)
Yes, ok until Dr Sharlet Salina returns

## 2020-09-24 ENCOUNTER — Inpatient Hospital Stay: Payer: Medicare Other | Admitting: Internal Medicine

## 2020-09-29 ENCOUNTER — Telehealth: Payer: Self-pay | Admitting: Internal Medicine

## 2020-09-29 NOTE — Telephone Encounter (Signed)
LVM for patient to rtn my call to schedule AWV with [facility]. Please schedule if patient calls the office.   Thanks,

## 2020-10-01 ENCOUNTER — Encounter: Payer: Self-pay | Admitting: Internal Medicine

## 2020-10-01 ENCOUNTER — Other Ambulatory Visit: Payer: Self-pay

## 2020-10-01 ENCOUNTER — Ambulatory Visit (INDEPENDENT_AMBULATORY_CARE_PROVIDER_SITE_OTHER): Payer: Medicare Other | Admitting: Internal Medicine

## 2020-10-01 VITALS — BP 126/80 | HR 80 | Temp 98.3°F | Ht 64.0 in | Wt 158.0 lb

## 2020-10-01 DIAGNOSIS — Z23 Encounter for immunization: Secondary | ICD-10-CM | POA: Diagnosis not present

## 2020-10-01 DIAGNOSIS — I10 Essential (primary) hypertension: Secondary | ICD-10-CM

## 2020-10-01 DIAGNOSIS — L03116 Cellulitis of left lower limb: Secondary | ICD-10-CM

## 2020-10-01 DIAGNOSIS — E119 Type 2 diabetes mellitus without complications: Secondary | ICD-10-CM | POA: Diagnosis not present

## 2020-10-01 MED ORDER — APIXABAN 5 MG PO TABS
5.0000 mg | ORAL_TABLET | Freq: Two times a day (BID) | ORAL | 1 refills | Status: DC
Start: 2020-10-01 — End: 2020-10-06

## 2020-10-01 MED ORDER — AMOXICILLIN 500 MG PO CAPS: 500.0000 mg | ORAL_CAPSULE | Freq: Every day | ORAL | 0 refills | Status: DC

## 2020-10-01 MED ORDER — BLOOD GLUCOSE MONITOR KIT: PACK | 0 refills | Status: DC

## 2020-10-01 MED ORDER — ALPRAZOLAM 0.25 MG PO TABS
0.2500 mg | ORAL_TABLET | Freq: Every day | ORAL | 2 refills | Status: DC | PRN
Start: 2020-10-01 — End: 2021-04-14

## 2020-10-01 MED ORDER — LIDOCAINE 5 % EX PTCH: 1.0000 | MEDICATED_PATCH | CUTANEOUS | 0 refills | Status: DC

## 2020-10-01 MED ORDER — DILTIAZEM HCL ER COATED BEADS 120 MG PO CP24
240.0000 mg | ORAL_CAPSULE | Freq: Every day | ORAL | 1 refills | Status: DC
Start: 2020-10-01 — End: 2020-10-06

## 2020-10-01 NOTE — Progress Notes (Signed)
Subjective:    Patient ID: [name] [name] [facility], female    DOB: 11-17-1931, 86 y.o.   MRN: [id number]  HPI The patient is here for follow up from the hospital.  She is here with her daughter   She was in the hospital in August - September for sepsis from LLE cellulitis and LLL PNA.  Her leg still swells and there is some redness.  She has seen ID and is on daily amoxicillin.  She is taking lasix daily, but did not take it today.  Her legs still swell - left leg more than right.  She is able to walk.     Sugars 120-134 at home. She was drinking ensure but is now drinking glucerna twice day.   She would like to check her sugars at home.      Medications and allergies reviewed with patient and updated if appropriate.  Patient Active Problem List   Diagnosis Date Noted  . Elevated LFTs   . Abnormal [place] (ultrasound) of abdomen   . Pressure injury of skin 07/29/2020  . Sepsis ([place]) 07/25/2020  . Cellulitis of left lower extremity 07/25/2020  . Atrial fibrillation with rapid ventricular response ([place]) 07/25/2020  . Hypotension 07/25/2020  . Lung nodule 07/25/2020  . Chronic diastolic heart failure ([place]) 10/27/2019  . Anxiety 05/02/2017  . Routine general medical examination at a health care facility 08/13/2015  . Tremor of hands and face 10/21/2013  . Skin lesion 10/07/2008  . SCIATICA, RIGHT 06/03/2008  . Hyperlipidemia 09/06/2007  . Essential hypertension 09/06/2007    Current Outpatient Medications on File Prior to Visit  Medication Sig Dispense Refill  . acetaminophen (TYLENOL) 500 MG tablet Take 1,000 mg by mouth every 6 (six) hours as needed for headache (pain). For pain     . ALPRAZolam (XANAX) 0.25 MG tablet Take 1 tablet (0.25 mg total) by mouth daily as needed. for anxiety (Patient taking differently: Take 0.25 mg by mouth daily as needed for anxiety. ) 30 tablet 2  . apixaban (ELIQUIS) 5 MG TABS tablet Take 1 tablet (5 mg total) by mouth 2 (two) times daily. 60 tablet   .  atorvastatin (LIPITOR) 20 MG tablet Take 1 tablet (20 mg total) by mouth daily. 90 tablet 1  . bisacodyl (DULCOLAX) 10 MG suppository Place 1 suppository (10 mg total) rectally daily as needed for moderate constipation. 12 suppository 0  . Calcium Carbonate-Vitamin D (CALCIUM-VITAMIN D) 500-200 MG-UNIT per tablet Take 1 tablet by mouth every other day.     . Carboxymethylcellulose Sodium (THERATEARS OP) Place 1 drop into both eyes 2 (two) times daily as needed (dry eyes).    Marland Kitchen dextromethorphan-guaiFENesin (MUCINEX DM) 30-600 MG 12hr tablet Take 2 tablets by mouth 2 (two) times daily as needed for cough.    . diltiazem (CARDIZEM CD) 120 MG 24 hr capsule     . diltiazem (CARDIZEM CD) 240 MG 24 hr capsule Take 1 capsule (240 mg total) by mouth daily.    Marland Kitchen docusate sodium (COLACE) 100 MG capsule Take 1 capsule (100 mg total) by mouth 2 (two) times daily. 10 capsule 0  . feeding supplement, GLUCERNA SHAKE, (GLUCERNA SHAKE) LIQD Take 237 mLs by mouth 2 (two) times daily between meals.  0  . furosemide (LASIX) 40 MG tablet TAKE 1 TABLET BY MOUTH EVERY DAY (Patient taking differently: Take 40 mg by mouth at bedtime. 9pm) 90 tablet 1  . Hydrocortisone (GERHARDT'S BUTT CREAM) CREA Apply 1 application topically daily  as needed for irritation.     . metoprolol tartrate (LOPRESSOR) 50 MG tablet Take 1 tablet (50 mg total) by mouth 2 (two) times daily.    . Multiple Vitamins-Minerals (MULTIVITAMIN WITH MINERALS) tablet Take 1 tablet by mouth daily at 6 PM.     . Multiple Vitamins-Minerals (PRESERVISION AREDS 2) CAPS Take 1 capsule by mouth in the morning and at bedtime.    . polyethylene glycol (MIRALAX / GLYCOLAX) 17 g packet Take 17 g by mouth daily. 14 each 0  . Zinc Oxide (TRIPLE PASTE) 12.8 % ointment Apply 1 application topically as needed for irritation.     No current facility-administered medications on file prior to visit.    Past Medical History:  Diagnosis Date  . A-fib ([place])    paroxysmal  .  Aftercare for healing traumatic fracture of arm, unspecified   . H/O: hemorrhoidectomy   . H/O: hysterectomy   . Hyperlipidemia   . Hypertension   . Malignant melanoma of skin of trunk, except scrotum ([place])   . Other postprocedural status(V45.89)   . Rosacea   . S/P cholecystectomy   . Sciatica    right  . Sprain of lumbar region     Past Surgical History:  Procedure Laterality Date  . ABDOMINAL HYSTERECTOMY    . BLADDER SUSPENSION     never go surgery because it got canceled due to heart rate   . cataract Bilateral 2019  . catheter ablation of atrail flutter    . CHOLECYSTECTOMY    . HERNIA REPAIR      Social History   Socioeconomic History  . Marital status: Widowed    Spouse name: Not on file  . Number of children: 6  . Years of education: 27  . Highest education level: Not on file  Occupational History  . Occupation: IT trainer: RETIRED    Comment: retired  Tobacco Use  . Smoking status: Never Smoker  . Smokeless tobacco: Never Used  Substance and Sexual Activity  . Alcohol use: No  . Drug use: No  . Sexual activity: Never  Other Topics Concern  . Not on file  Social History Narrative   10th grade. Married '51.4 sons- '52, '54, '58, '70; 2 daughters- '56, '64 older girl died of ovarian cancer.11 grandchildren, 2 great grands. Retired Primary school teacher. ACP - wishes full code status: CPR, mechanical ventilator and all heroic measures.    Social Determinants of Health   Financial Resource Strain:   . Difficulty of Paying Living Expenses: Not on file  Food Insecurity:   . Worried About Charity fundraiser in the Last Year: Not on file  . Ran Out of Food in the Last Year: Not on file  Transportation Needs:   . Lack of Transportation (Medical): Not on file  . Lack of Transportation (Non-Medical): Not on file  Physical Activity:   . Days of Exercise per Week: Not on file  . Minutes of Exercise per Session: Not on file  Stress:   .  Feeling of Stress : Not on file  Social Connections:   . Frequency of Communication with Friends and Family: Not on file  . Frequency of Social Gatherings with Friends and Family: Not on file  . Attends Religious Services: Not on file  . Active Member of Clubs or Organizations: Not on file  . Attends Archivist Meetings: Not on file  . Marital Status: Not on file    Family  History  Problem Relation Age of Onset  . Heart failure Mother   . Heart failure Father   . Liver cancer Daughter     Review of Systems  Constitutional: Negative for chills and fever.  Respiratory: Negative for cough, shortness of breath and wheezing.   Cardiovascular: Positive for palpitations and leg swelling. Negative for chest pain.  Gastrointestinal: Positive for constipation. Negative for abdominal pain and nausea.  Neurological: Negative for dizziness, light-headedness and headaches.       Objective:   Vitals:   10/01/20 1313  BP: 126/80  Pulse: 80  Temp: 98.3 F (36.8 C)  SpO2: 98%   BP Readings from Last 3 Encounters:  10/01/20 126/80  08/12/20 103/61  07/25/20 (!) 142/95   Wt Readings from Last 3 Encounters:  10/01/20 158 lb (71.7 kg)  08/09/20 169 lb 5.7 oz (76.8 kg)  07/25/20 160 lb (72.6 kg)   Body mass index is 27.12 kg/m.   Physical Exam    Constitutional: Appears well-developed and well-nourished. No distress.  HENT:  Head: Normocephalic and atraumatic.  Neck: Neck supple. No tracheal deviation present. No thyromegaly present.  No cervical lymphadenopathy Cardiovascular: Normal rate, regular rhythm and normal heart sounds.   No murmur heard. No carotid bruit .  1 + pitting edema RLE, 2+ pitting edema in LLE with mild erythema and warmth Pulmonary/Chest: Effort normal and breath sounds normal. No respiratory distress. No has no wheezes. No rales.  Skin: Skin is warm and dry. Not diaphoretic.  Psychiatric: Normal mood and affect. Behavior is normal.       Assessment & Plan:    See Problem List for Assessment and Plan of chronic medical problems.    This visit occurred during the SARS-CoV-2 public health emergency.  Safety protocols were in place, including screening questions prior to the visit, additional usage of staff PPE, and extensive cleaning of exam room while observing appropriate contact time as indicated for disinfecting solutions.

## 2020-10-01 NOTE — Patient Instructions (Addendum)
Flu vaccine today.     Medications reviewed and updated.  Changes include :   None.  Increase lasix to 40 mg twice daily for 2-3 days to improve swelling.   Glucometer prescription given.    Your prescription(s) have been submitted to your pharmacy. Please take as directed and contact our office if you believe you are having problem(s) with the medication(s).

## 2020-10-03 DIAGNOSIS — R7303 Prediabetes: Secondary | ICD-10-CM | POA: Insufficient documentation

## 2020-10-03 DIAGNOSIS — E119 Type 2 diabetes mellitus without complications: Secondary | ICD-10-CM | POA: Insufficient documentation

## 2020-10-03 NOTE — Assessment & Plan Note (Signed)
Chronic BP well controlled Continue diltiazem 240 mg daily, metoprolol 50 mg BID

## 2020-10-03 NOTE — Assessment & Plan Note (Signed)
Chronic Mild erythema, warmth - no breaks in skin On amoxicillin 500 mg daily - has seen ID and will follow up with them Leg is very swollen - need to improve edema -- increase lasix to 40 mg BID x 2-3 days to improve swelling - has cardiology follow up next week  Elevate leg

## 2020-10-03 NOTE — Assessment & Plan Note (Signed)
Lab Results  Component Value Date   HGBA1C 6.8 (H) 07/31/2020   Prescription for a glucometer given Sugars controlled at home Continue glucerna Diet controlled

## 2020-10-04 ENCOUNTER — Telehealth: Payer: Self-pay

## 2020-10-04 NOTE — Telephone Encounter (Signed)
Sent to Dr. Quay Burow.

## 2020-10-04 NOTE — Telephone Encounter (Signed)
Let her daughter know - they are not covered - would recommend the otc lidocaine patch

## 2020-10-05 NOTE — Progress Notes (Signed)
PCP:  Hoyt Koch, MD Primary Cardiologist: No primary care provider on file. Electrophysiologist: Cristopher Peru, MD   [name] [name] is a 86 y.o. female seen today for Cristopher Peru, MD for post hospital follow up.  Since discharge from hospital the patient reports doing well. She got out of rehab last week and is living with her daughter for a few weeks. She has been taking extra lasix as needed in the evenings.  she denies chest pain, palpitations, dyspnea, PND, orthopnea, nausea, vomiting, dizziness, syncope, weight gain, or early satiety.  Past Medical History:  Diagnosis Date  . A-fib ([place])    paroxysmal  . Aftercare for healing traumatic fracture of arm, unspecified   . H/O: hemorrhoidectomy   . H/O: hysterectomy   . Hyperlipidemia   . Hypertension   . Malignant melanoma of skin of trunk, except scrotum ([place])   . Other postprocedural status(V45.89)   . Rosacea   . S/P cholecystectomy   . Sciatica    right  . Sprain of lumbar region    Past Surgical History:  Procedure Laterality Date  . ABDOMINAL HYSTERECTOMY    . BLADDER SUSPENSION     never go surgery because it got canceled due to heart rate   . cataract Bilateral 2019  . catheter ablation of atrail flutter    . CHOLECYSTECTOMY    . HERNIA REPAIR      Current Outpatient Medications  Medication Sig Dispense Refill  . acetaminophen (TYLENOL) 500 MG tablet Take 1,000 mg by mouth every 6 (six) hours as needed for headache (pain). For pain     . ALPRAZolam (XANAX) 0.25 MG tablet Take 1 tablet (0.25 mg total) by mouth daily as needed. for anxiety 30 tablet 2  . amoxicillin (AMOXIL) 500 MG capsule Take 1 capsule (500 mg total) by mouth daily. 30 capsule 0  . apixaban (ELIQUIS) 5 MG TABS tablet Take 1 tablet (5 mg total) by mouth 2 (two) times daily. 180 tablet 1  . atorvastatin (LIPITOR) 20 MG tablet Take 1 tablet (20 mg total) by mouth daily. 90 tablet 1  . bisacodyl (DULCOLAX) 10 MG suppository Place 1  suppository (10 mg total) rectally daily as needed for moderate constipation. 12 suppository 0  . blood glucose meter kit and supplies KIT Dispense based on patient and insurance preference. Use up to four times daily as directed. (FOR E11.9). 1 each 0  . Calcium Carbonate-Vitamin D (CALCIUM-VITAMIN D) 500-200 MG-UNIT per tablet Take 1 tablet by mouth every other day.     . Carboxymethylcellulose Sodium (THERATEARS OP) Place 1 drop into both eyes 2 (two) times daily as needed (dry eyes).    Marland Kitchen dextromethorphan-guaiFENesin (MUCINEX DM) 30-600 MG 12hr tablet Take 2 tablets by mouth 2 (two) times daily as needed for cough.    . diltiazem (CARDIZEM CD) 120 MG 24 hr capsule Take 2 capsules (240 mg total) by mouth daily. 180 capsule 1  . docusate sodium (COLACE) 100 MG capsule Take 1 capsule (100 mg total) by mouth 2 (two) times daily. 10 capsule 0  . feeding supplement, GLUCERNA SHAKE, (GLUCERNA SHAKE) LIQD Take 237 mLs by mouth 2 (two) times daily between meals.  0  . furosemide (LASIX) 40 MG tablet TAKE 1 TABLET BY MOUTH EVERY DAY (Patient taking differently: Take 40 mg by mouth at bedtime. 9pm) 90 tablet 1  . Hydrocortisone (GERHARDT'S BUTT CREAM) CREA Apply 1 application topically daily as needed for irritation.     . lidocaine (  LIDODERM) 5 % Place 1 patch onto the skin daily. Remove & Discard patch within 12 hours or as directed by MD 30 patch 0  . metoprolol tartrate (LOPRESSOR) 50 MG tablet Take 1 tablet (50 mg total) by mouth 2 (two) times daily. 180 tablet 1  . Multiple Vitamins-Minerals (MULTIVITAMIN WITH MINERALS) tablet Take 1 tablet by mouth daily at 6 PM.     . Multiple Vitamins-Minerals (PRESERVISION AREDS 2) CAPS Take 1 capsule by mouth in the morning and at bedtime.    . polyethylene glycol (MIRALAX / GLYCOLAX) 17 g packet Take 17 g by mouth daily. 14 each 0  . Zinc Oxide (TRIPLE PASTE) 12.8 % ointment Apply 1 application topically as needed for irritation.     No current  facility-administered medications for this visit.    Allergies  Allergen Reactions  . Antihistamines, Diphenhydramine-Type Other (See Comments)    ALL ANTIHISTAMINES causes her heart to race  . Oxycodone-Acetaminophen Nausea Only    Social History   Socioeconomic History  . Marital status: Widowed    Spouse name: Not on file  . Number of children: 6  . Years of education: 29  . Highest education level: Not on file  Occupational History  . Occupation: IT trainer: RETIRED    Comment: retired  Tobacco Use  . Smoking status: Never Smoker  . Smokeless tobacco: Never Used  Substance and Sexual Activity  . Alcohol use: No  . Drug use: No  . Sexual activity: Never  Other Topics Concern  . Not on file  Social History Narrative   10th grade. Married '51.4 sons- '52, '54, '58, '70; 2 daughters- '56, '64 older girl died of ovarian cancer.11 grandchildren, 2 great grands. Retired Primary school teacher. ACP - wishes full code status: CPR, mechanical ventilator and all heroic measures.    Social Determinants of Health   Financial Resource Strain:   . Difficulty of Paying Living Expenses: Not on file  Food Insecurity:   . Worried About Charity fundraiser in the Last Year: Not on file  . Ran Out of Food in the Last Year: Not on file  Transportation Needs:   . Lack of Transportation (Medical): Not on file  . Lack of Transportation (Non-Medical): Not on file  Physical Activity:   . Days of Exercise per Week: Not on file  . Minutes of Exercise per Session: Not on file  Stress:   . Feeling of Stress : Not on file  Social Connections:   . Frequency of Communication with Friends and Family: Not on file  . Frequency of Social Gatherings with Friends and Family: Not on file  . Attends Religious Services: Not on file  . Active Member of Clubs or Organizations: Not on file  . Attends Archivist Meetings: Not on file  . Marital Status: Not on file  Intimate  Partner Violence:   . Fear of Current or Ex-Partner: Not on file  . Emotionally Abused: Not on file  . Physically Abused: Not on file  . Sexually Abused: Not on file     Review of Systems: General: No chills, fever, night sweats or weight changes  Cardiovascular:  No chest pain, dyspnea on exertion, edema, orthopnea, palpitations, paroxysmal nocturnal dyspnea Dermatological: No rash, lesions or masses Respiratory: No cough, dyspnea Urologic: No hematuria, dysuria Abdominal: No nausea, vomiting, diarrhea, bright red blood per rectum, melena, or hematemesis Neurologic: No visual changes, weakness, changes in mental status All other systems  reviewed and are otherwise negative except as noted above.  Physical Exam: Vitals:   10/06/20 0947  BP: 128/62  Pulse: (!) 106  SpO2: 96%  Weight: 151 lb 6.4 oz (68.7 kg)  Height: _0  (1.626 m)    GEN- The patient is well appearing, alert and oriented x 3 today.   HEENT: normocephalic, atraumatic; sclera clear, conjunctiva pink; hearing intact; oropharynx clear; neck supple, no JVP Lymph- no cervical lymphadenopathy Lungs- Clear to ausculation bilaterally, normal work of breathing.  No wheezes, rales, rhonchi Heart- Regular rate and rhythm, no murmurs, rubs or gallops, PMI not laterally displaced GI- soft, non-tender, non-distended, bowel sounds present, no hepatosplenomegaly Extremities- no clubbing, cyanosis, or edema; DP/PT/radial pulses 2+ bilaterally MS- no significant deformity or atrophy Skin- warm and dry, no rash or lesion Psych- euthymic mood, full affect Neuro- strength and sensation are intact  EKG is ordered. Personal review of EKG from today shows AF with mild RVR at 106 bpm (Just took diltiazem prior to coming)  Additional studies reviewed include: Previous EP office notes, admission notes  Assessment and Plan:  1. Persistent atrial fibrillation With recent exacerbation in the setting of acute illness with strep group  C bacteremia.  On Eliquis chronically for CHA2DS2VASC of at least 5.   Continue lopressor 50 mg BID Continue diltiazem 240 mg daily. Daughter says HRs are better controlled at home and would not adjust up for now. She would like to potentially come off of it in the future if able. I encouraged her that this is a relatively benign drug, we would just watch for LOW heart rates.  Labs today.   2. HTN Continue current regiment.  3. LLE cellulitis Following with PCP and ID.   4. Diastolic CHF Volume status stable. LLE with edema in setting of cellulitis and wrap.  Continue lasix 40 mg daily, with additional as needed. CMET today.   5. HLD Continue statin. CMET today.   Shirley Friar, PA-C  10/06/20 11:13 AM

## 2020-10-05 NOTE — Telephone Encounter (Signed)
Spoke with daughter regarding denial of PA and that pt can start over the counter Lidocaine patches. She expressed understanding.

## 2020-10-06 ENCOUNTER — Encounter: Payer: Self-pay | Admitting: Student

## 2020-10-06 ENCOUNTER — Ambulatory Visit (INDEPENDENT_AMBULATORY_CARE_PROVIDER_SITE_OTHER): Payer: Medicare Other | Admitting: Student

## 2020-10-06 ENCOUNTER — Other Ambulatory Visit: Payer: Self-pay

## 2020-10-06 VITALS — BP 128/62 | HR 106 | Ht 64.0 in | Wt 151.4 lb

## 2020-10-06 DIAGNOSIS — E119 Type 2 diabetes mellitus without complications: Secondary | ICD-10-CM

## 2020-10-06 DIAGNOSIS — I10 Essential (primary) hypertension: Secondary | ICD-10-CM

## 2020-10-06 DIAGNOSIS — I5032 Chronic diastolic (congestive) heart failure: Secondary | ICD-10-CM

## 2020-10-06 DIAGNOSIS — I482 Chronic atrial fibrillation, unspecified: Secondary | ICD-10-CM

## 2020-10-06 MED ORDER — DILTIAZEM HCL ER COATED BEADS 120 MG PO CP24: 240.0000 mg | ORAL_CAPSULE | Freq: Every day | ORAL | 1 refills | Status: DC

## 2020-10-06 MED ORDER — APIXABAN 5 MG PO TABS
5.0000 mg | ORAL_TABLET | Freq: Two times a day (BID) | ORAL | 1 refills | Status: DC
Start: 2020-10-06 — End: 2021-07-13

## 2020-10-06 MED ORDER — METOPROLOL TARTRATE 50 MG PO TABS
50.0000 mg | ORAL_TABLET | Freq: Two times a day (BID) | ORAL | 1 refills | Status: DC
Start: 2020-10-06 — End: 2021-01-31

## 2020-10-06 NOTE — Patient Instructions (Addendum)
Medication Instructions:  *If you need a refill on your cardiac medications before your next appointment, please call your pharmacy*  Lab Work: Your physician has recommended that you have lab work today: CBC, CMET, and HgB A1C If you have labs (blood work) drawn today and your tests are completely normal, you will receive your results only by: Marland Kitchen MyChart Message (if you have MyChart) OR . A paper copy in the mail If you have any lab test that is abnormal or we need to change your treatment, we will call you to review the results.  Follow-Up: At [facility], you and your health needs are our priority.  As part of our continuing mission to provide you with exceptional heart care, we have created designated Provider Care Teams.  These Care Teams include your primary Cardiologist (physician) and Advanced Practice Providers (APPs -  Physician Assistants and Nurse Practitioners) who all work together to provide you with the care you need, when you need it.  We recommend signing up for the patient portal called "MyChart".  Sign up information is provided on this After Visit Summary.  MyChart is used to connect with patients for Virtual Visits (Telemedicine).  Patients are able to view lab/test results, encounter notes, upcoming appointments, etc.  Non-urgent messages can be sent to your provider as well.   To learn more about what you can do with MyChart, go to [email or web address].    Your next appointment:   Your physician recommends that you schedule a follow-up appointment in: 6 MONTHS with Dr. Lovena Le. -- Tuesday 04/05/21 at 9:45 am  The format for your next appointment:   In Person with Cristopher Peru, MD

## 2020-10-07 LAB — CBC WITH DIFFERENTIAL/PLATELET
Basophils Absolute: 0 10*3/uL (ref 0.0–0.2)
Basos: 1 %
EOS (ABSOLUTE): 0.1 10*3/uL (ref 0.0–0.4)
Eos: 1 %
Hematocrit: 39.1 % (ref 34.0–46.6)
Hemoglobin: 12.8 g/dL (ref 11.1–15.9)
Immature Grans (Abs): 0 10*3/uL (ref 0.0–0.1)
Immature Granulocytes: 0 %
Lymphocytes Absolute: 1.2 10*3/uL (ref 0.7–3.1)
Lymphs: 25 %
MCH: 29.6 pg (ref 26.6–33.0)
MCHC: 32.7 g/dL (ref 31.5–35.7)
MCV: 91 fL (ref 79–97)
Monocytes Absolute: 0.3 10*3/uL (ref 0.1–0.9)
Monocytes: 7 %
Neutrophils Absolute: 3.2 10*3/uL (ref 1.4–7.0)
Neutrophils: 66 %
Platelets: 261 10*3/uL (ref 150–450)
RBC: 4.32 x10E6/uL (ref 3.77–5.28)
RDW: 13.1 % (ref 11.7–15.4)
WBC: 4.9 10*3/uL (ref 3.4–10.8)

## 2020-10-07 LAB — COMPREHENSIVE METABOLIC PANEL
ALT: 12 IU/L (ref 0–32)
AST: 21 IU/L (ref 0–40)
Albumin/Globulin Ratio: 1.3 (ref 1.2–2.2)
Albumin: 4.5 g/dL (ref 3.6–4.6)
Alkaline Phosphatase: 128 IU/L — ABNORMAL HIGH (ref 44–121)
BUN/Creatinine Ratio: 22 (ref 12–28)
BUN: 14 mg/dL (ref 8–27)
Bilirubin Total: 1.6 mg/dL — ABNORMAL HIGH (ref 0.0–1.2)
CO2: 24 mmol/L (ref 20–29)
Calcium: 10.3 mg/dL (ref 8.7–10.3)
Chloride: 102 mmol/L (ref 96–106)
Creatinine, Ser: 0.65 mg/dL (ref 0.57–1.00)
GFR calc Af Amer: 92 mL/min/{1.73_m2} (ref >59)
GFR calc non Af Amer: 80 mL/min/{1.73_m2} (ref >59)
Globulin, Total: 3.6 g/dL (ref 1.5–4.5)
Glucose: 164 mg/dL — ABNORMAL HIGH (ref 65–99)
Potassium: 3.7 mmol/L (ref 3.5–5.2)
Sodium: 139 mmol/L (ref 134–144)
Total Protein: 8.1 g/dL (ref 6.0–8.5)

## 2020-10-07 LAB — HEMOGLOBIN A1C
Est. average glucose Bld gHb Est-mCnc: 131 mg/dL
Hgb A1c MFr Bld: 6.2 % — ABNORMAL HIGH (ref 4.8–5.6)

## 2020-10-07 NOTE — Addendum Note (Signed)
Addended by: Marcina Millard on: 10/07/2020 08:33 AM   Modules accepted: Orders

## 2020-10-14 DIAGNOSIS — L905 Scar conditions and fibrosis of skin: Secondary | ICD-10-CM | POA: Diagnosis not present

## 2020-10-14 DIAGNOSIS — Z85828 Personal history of other malignant neoplasm of skin: Secondary | ICD-10-CM | POA: Diagnosis not present

## 2020-10-14 DIAGNOSIS — D225 Melanocytic nevi of trunk: Secondary | ICD-10-CM | POA: Diagnosis not present

## 2020-10-14 DIAGNOSIS — L814 Other melanin hyperpigmentation: Secondary | ICD-10-CM | POA: Diagnosis not present

## 2020-10-14 DIAGNOSIS — D1801 Hemangioma of skin and subcutaneous tissue: Secondary | ICD-10-CM | POA: Diagnosis not present

## 2020-10-14 DIAGNOSIS — L821 Other seborrheic keratosis: Secondary | ICD-10-CM | POA: Diagnosis not present

## 2020-10-14 DIAGNOSIS — I8310 Varicose veins of unspecified lower extremity with inflammation: Secondary | ICD-10-CM | POA: Diagnosis not present

## 2020-10-25 ENCOUNTER — Other Ambulatory Visit: Payer: Self-pay

## 2020-10-25 ENCOUNTER — Encounter: Payer: Self-pay | Admitting: Podiatry

## 2020-10-25 ENCOUNTER — Ambulatory Visit (INDEPENDENT_AMBULATORY_CARE_PROVIDER_SITE_OTHER): Payer: Medicare Other | Admitting: Internal Medicine

## 2020-10-25 ENCOUNTER — Ambulatory Visit (INDEPENDENT_AMBULATORY_CARE_PROVIDER_SITE_OTHER): Payer: Medicare Other | Admitting: Podiatry

## 2020-10-25 ENCOUNTER — Encounter: Payer: Self-pay | Admitting: Internal Medicine

## 2020-10-25 VITALS — BP 128/76 | HR 80 | Temp 97.7°F | Wt 151.0 lb

## 2020-10-25 DIAGNOSIS — M79674 Pain in right toe(s): Secondary | ICD-10-CM

## 2020-10-25 DIAGNOSIS — M2041 Other hammer toe(s) (acquired), right foot: Secondary | ICD-10-CM | POA: Diagnosis not present

## 2020-10-25 DIAGNOSIS — L600 Ingrowing nail: Secondary | ICD-10-CM

## 2020-10-25 DIAGNOSIS — L989 Disorder of the skin and subcutaneous tissue, unspecified: Secondary | ICD-10-CM | POA: Diagnosis not present

## 2020-10-25 DIAGNOSIS — M2042 Other hammer toe(s) (acquired), left foot: Secondary | ICD-10-CM | POA: Diagnosis not present

## 2020-10-25 DIAGNOSIS — L03119 Cellulitis of unspecified part of limb: Secondary | ICD-10-CM

## 2020-10-25 DIAGNOSIS — M79675 Pain in left toe(s): Secondary | ICD-10-CM | POA: Diagnosis not present

## 2020-10-25 DIAGNOSIS — B351 Tinea unguium: Secondary | ICD-10-CM

## 2020-10-25 MED ORDER — AMOXICILLIN 500 MG PO CAPS: 500.0000 mg | ORAL_CAPSULE | Freq: Every day | ORAL | 3 refills | Status: DC

## 2020-10-25 NOTE — Assessment & Plan Note (Signed)
She seems to be doing well on the amoxicillin for prevention/prophylaxis so will continue with this daily.  She will rtc in 1 year or sooner if she has new cellulitis If she does flare, would just use the same amoxicillin but three times a day for 5-7 days

## 2020-10-25 NOTE — Progress Notes (Signed)
   Subjective:    Patient ID: [name] [name] [facility], female    DOB: [birth year], 86 y.o.   MRN: [id number]  HPI Here for follow up of cellulitis.  I saw her during her hospitalization in September for cellulitis.  Blood culture grew Group C Strep and her broad antibiotics were transitioned to oral amoxicillin.  Her hospitalization was complicated by Afib and she was then transferred to rehab. On October 12th, she developed more redness and swelling of her leg and was started initially on ceftriaxone and changed to Keflex by ID at [facility].  She was then changed to amoxicillin and completed a course and Dr. Mable Fill at [facility] had recommended/agreed with daily amoxicillin for suppression.   She is here today for follow up and remains on amoxicillin.  No further episodes of cellulitis.     Review of Systems  Constitutional: Negative for fever.  Gastrointestinal: Negative for diarrhea and nausea.       Objective:   Physical Exam Eyes:     General: No scleral icterus. Musculoskeletal:     Comments: Left leg with some edema, no erythema, no warmth  Skin:    Comments: Right shin with an area with two red scrapes, sensitive but no warmth, not open  Neurological:     Mental Status: She is alert.   SH: living with her daughter but will be going home soon       Assessment & Plan:

## 2020-10-25 NOTE — Assessment & Plan Note (Signed)
Her skin at this time does not have any open areas.  She will continue with lotion and routine skin care

## 2020-10-27 NOTE — Progress Notes (Signed)
Subjective:   Patient ID: [name] [name], female   DOB: 86 y.o.   MRN: [id number]   HPI Patient presents with caregiver with significant digital deformities and nail disease 1-5 both feet with history of cellulitis.  Patient and caregiver cannot take care of these nails and she is at high risk due to her frail health history.  Patient has digital deformities noted bilateral and develops ingrown toenail deformities and does not smoke   Review of Systems  All other systems reviewed and are negative.       Objective:  Physical Exam Vitals and nursing note reviewed.  Constitutional:      Appearance: She is well-developed.  Pulmonary:     Effort: Pulmonary effort is normal.  Musculoskeletal:        General: Normal range of motion.  Skin:    General: Skin is warm.  Neurological:     Mental Status: She is alert.     Neurovascular status was found to be diminished bilateral with diminished sharp dull vibratory.  Patient is found to have significant thickness and incurvation of nailbeds 1-5 both feet that are dystrophic and painful digital deformities which increases the pressure on the lesser nails.  Patient does have history of cellulitis left but it appears to be resolved currently and she does have to be very careful with any treatments     Assessment:  Chronic mycotic nail infection bilateral ingrown component and digital deformities creating stress on her beds     Plan:  H&P all reviewed conditions reviewed education rendered to patient.  Today I carefully debrided nailbeds 1-5 both feet taking the corners out take all pressure with no iatrogenic bleeding and I discussed routine care to be done in future.  I educated them on wide shoes and other modalities to try to prevent problems for this patient

## 2020-11-02 ENCOUNTER — Telehealth: Payer: Self-pay | Admitting: Internal Medicine

## 2020-11-02 NOTE — Progress Notes (Signed)
  Chronic Care Management   Outreach Note  11/02/2020 Name: Iriel Nason [facility] MRN: [id number] DOB: 01-14-1931  Referred by: Hoyt Koch, MD Reason for referral : No chief complaint on file.   An unsuccessful telephone outreach was attempted today. The patient was referred to the pharmacist for assistance with care management and care coordination.   Follow Up Plan:   Carley Perdue [employer] Scheduler

## 2020-11-11 ENCOUNTER — Telehealth: Payer: Self-pay | Admitting: Internal Medicine

## 2020-11-11 NOTE — Progress Notes (Signed)
  Chronic Care Management   Outreach Note  11/11/2020 Name: [name] [name] [facility] MRN: [id number] DOB: [birth year]  Referred by: Hoyt Koch, MD Reason for referral : No chief complaint on file.   A second unsuccessful telephone outreach was attempted today. The patient was referred to pharmacist for assistance with care management and care coordination.  Follow Up Plan:   Carley Perdue [employer] Scheduler

## 2020-11-16 ENCOUNTER — Telehealth: Payer: Self-pay | Admitting: *Deleted

## 2020-11-16 NOTE — Telephone Encounter (Signed)
Patient's daughter informed of below. She wants to know if she needs any additional lab testing. If so, she wants them to be done in [place].

## 2020-11-16 NOTE — Telephone Encounter (Signed)
Can just take multivitamin or 1000 units vitamin D daily.

## 2020-11-16 NOTE — Telephone Encounter (Signed)
Patient's daughter wants to know what dose Vit D does patient need based on 08/01/20 level of 29.19. Also, she wants to know if she needs any additional lab testing. Kathlee Nations states she will have labs done in [place] if patient needs labs.

## 2020-11-17 NOTE — Telephone Encounter (Signed)
I'm not sure I can answer that since I have not seen her in some time. Need for labs often is based on how she is doing clinically. Does she have a follow up scheduled in the next 1-2 months?

## 2020-11-17 NOTE — Telephone Encounter (Signed)
Daughter Kathlee Nations notified of Dr Sharlet Salina response. Pt does not have an OV scheduled; is advised to schedule in next 1-2 months to address her concerns. Daughter verb understanding & states she will call back once she is with her mother to get the appt scheduled.

## 2020-11-23 ENCOUNTER — Telehealth: Payer: Self-pay | Admitting: Internal Medicine

## 2020-11-23 NOTE — Progress Notes (Signed)
  Chronic Care Management   Note  11/23/2020 Name: [name] [name] [facility] MRN: [id number] DOB: 11/28/1930  [name] is a 86 y.o. year old female who is a primary care patient of Myrlene Broker, MD. I reached out to Kristine Royal [facility] by phone today in response to a referral sent by Ms. Kristine Royal Thornsberry's PCP, Myrlene Broker, MD.   Ms. [facility] was given information about Chronic Care Management services today including:  1. CCM service includes personalized support from designated clinical staff supervised by her physician, including individualized plan of care and coordination with other care providers 2. 24/7 contact phone numbers for assistance for urgent and routine care needs. 3. Service will only be billed when office clinical staff spend 20 minutes or more in a month to coordinate care. 4. Only one practitioner may furnish and bill the service in a calendar month. 5. The patient may stop CCM services at any time (effective at the end of the month) by phone call to the office staff.   Patient agreed to services and verbal consent obtained.   Follow up plan:   Carley Perdue [employer] Scheduler

## 2020-11-30 ENCOUNTER — Ambulatory Visit: Payer: Medicare Other | Admitting: Internal Medicine

## 2020-12-10 ENCOUNTER — Encounter: Payer: Self-pay | Admitting: Internal Medicine

## 2020-12-10 ENCOUNTER — Ambulatory Visit (INDEPENDENT_AMBULATORY_CARE_PROVIDER_SITE_OTHER): Payer: Medicare Other | Admitting: Internal Medicine

## 2020-12-10 ENCOUNTER — Other Ambulatory Visit: Payer: Self-pay

## 2020-12-10 DIAGNOSIS — E782 Mixed hyperlipidemia: Secondary | ICD-10-CM | POA: Diagnosis not present

## 2020-12-10 DIAGNOSIS — I10 Essential (primary) hypertension: Secondary | ICD-10-CM

## 2020-12-10 DIAGNOSIS — L03119 Cellulitis of unspecified part of limb: Secondary | ICD-10-CM | POA: Diagnosis not present

## 2020-12-10 NOTE — Assessment & Plan Note (Signed)
Recent lipid panel reviewed and keep lipitor 20 mg daily.

## 2020-12-10 NOTE — Assessment & Plan Note (Signed)
Taking amoxicillin daily and will continue. Seeing ID yearly.

## 2020-12-10 NOTE — Assessment & Plan Note (Signed)
BP at goal on lasix, diltiazem, metoprolol. Recent labs without indication for change.

## 2020-12-10 NOTE — Patient Instructions (Addendum)
We do not need labs today.   Let us know for any refills.

## 2020-12-10 NOTE — Progress Notes (Signed)
   Subjective:   Patient ID: [name] [name] [facility], female    DOB: Apr 05, 1931, 87 y.o.   MRN: [id number]  HPI The patient is an 87 YO female coming in for follow up chronic cellulitis (several episodes of sepsis with cellulitis in the past, now taking amoxicillin daily likely for life, seeing ID yearly, she does have chronic swelling but stable at this time, no redness, left leg), and blood pressure (BP at goal on diltiazem and lasix and metoprolol) and cholesterol (recent labs, taking lipitor, denies side effects, denies chest pains or stroke symptoms).   Review of Systems  Constitutional: Negative.   HENT: Negative.   Eyes: Negative.   Respiratory: Negative for cough, chest tightness and shortness of breath.   Cardiovascular: Positive for leg swelling. Negative for chest pain and palpitations.  Gastrointestinal: Negative for abdominal distention, abdominal pain, constipation, diarrhea, nausea and vomiting.  Musculoskeletal: Negative.   Skin: Negative.   Neurological: Negative.   Psychiatric/Behavioral: Negative.     Objective:  Physical Exam Constitutional:      Appearance: She is well-developed and well-nourished.  HENT:     Head: Normocephalic and atraumatic.  Eyes:     Extraocular Movements: EOM normal.  Cardiovascular:     Rate and Rhythm: Normal rate and regular rhythm.  Pulmonary:     Effort: Pulmonary effort is normal. No respiratory distress.     Breath sounds: Normal breath sounds. No wheezing or rales.  Abdominal:     General: Bowel sounds are normal. There is no distension.     Palpations: Abdomen is soft.     Tenderness: There is no abdominal tenderness. There is no rebound.  Musculoskeletal:        General: No edema.     Cervical back: Normal range of motion.  Skin:    General: Skin is warm and dry.  Neurological:     Mental Status: She is alert and oriented to person, place, and time.     Coordination: Coordination normal.  Psychiatric:        Mood and Affect:  Mood and affect normal.     Vitals:   12/10/20 1416  BP: 116/74  Pulse: 86  Temp: 98.1 F (36.7 C)  TempSrc: Oral  SpO2: 98%  Weight: 149 lb 9.6 oz (67.9 kg)  Height: 5\' 4"  (1.626 m)    This visit occurred during the SARS-CoV-2 public health emergency.  Safety protocols were in place, including screening questions prior to the visit, additional usage of staff PPE, and extensive cleaning of exam room while observing appropriate contact time as indicated for disinfecting solutions.   Assessment & Plan:

## 2021-01-07 ENCOUNTER — Ambulatory Visit: Payer: Medicare Other

## 2021-01-19 ENCOUNTER — Telehealth: Payer: Self-pay | Admitting: Internal Medicine

## 2021-01-19 ENCOUNTER — Encounter: Payer: Self-pay | Admitting: Internal Medicine

## 2021-01-19 NOTE — Telephone Encounter (Signed)
Patients daughter Kathlee Nations called and said that the patient is needing a Tier Exception Form for apixaban (ELIQUIS) 5 MG TABS tablet. Kathlee Nations said that it needs to be expedited. She said that Dr. Sharlet Salina would need to call [facility]. She said that Dr. Quay Burow wrote the original prescription. She said that the patient will run out of medication on Saturday morning. She said that the patient usually get a 90 day supply. Please advise    [facility]: [phone]

## 2021-01-19 NOTE — Telephone Encounter (Signed)
   [facility] calling for expedited tier exception  Eliquis prescribed by    For Tier Exception call [phone] Fax [phone]

## 2021-01-20 NOTE — Telephone Encounter (Signed)
Medication was prescribed by cardiology. Advised the patient she may want to reach out to them.

## 2021-01-21 ENCOUNTER — Telehealth: Payer: Self-pay

## 2021-01-21 NOTE — Telephone Encounter (Signed)
**Note De-Identified [name] [name] Obfuscation** Per the pts [facility] request I have started a Eliquis tier exception through covermymeds. Key: [id number]

## 2021-01-25 ENCOUNTER — Other Ambulatory Visit: Payer: Self-pay | Admitting: Student

## 2021-01-25 NOTE — Telephone Encounter (Signed)
**Note De-Identified [name] Obfuscation** I called [facility] and s/w Ebony Hail concerning a letter we received about he pts Eliquis as it is unclear whether or not it was approved. Per Ebony Hail the Eliquis tier exception was denied because it is already at the lowest tier possible for a name brand medication per medicare Part D.  I have notified the pt [name] [name] her [facility] message. See [facility] message from 2/24 for details.

## 2021-04-05 ENCOUNTER — Ambulatory Visit: Payer: Medicare Other | Admitting: Internal Medicine

## 2021-04-12 ENCOUNTER — Other Ambulatory Visit: Payer: Self-pay | Admitting: Internal Medicine

## 2021-04-19 ENCOUNTER — Other Ambulatory Visit: Payer: Self-pay

## 2021-04-19 ENCOUNTER — Ambulatory Visit (INDEPENDENT_AMBULATORY_CARE_PROVIDER_SITE_OTHER): Payer: Medicare Other | Admitting: Student

## 2021-04-19 ENCOUNTER — Encounter: Payer: Self-pay | Admitting: Student

## 2021-04-19 VITALS — BP 118/78 | HR 81 | Ht 64.0 in | Wt 141.0 lb

## 2021-04-19 DIAGNOSIS — I10 Essential (primary) hypertension: Secondary | ICD-10-CM | POA: Diagnosis not present

## 2021-04-19 DIAGNOSIS — I5032 Chronic diastolic (congestive) heart failure: Secondary | ICD-10-CM | POA: Diagnosis not present

## 2021-04-19 DIAGNOSIS — I482 Chronic atrial fibrillation, unspecified: Secondary | ICD-10-CM | POA: Diagnosis not present

## 2021-04-19 DIAGNOSIS — E782 Mixed hyperlipidemia: Secondary | ICD-10-CM | POA: Diagnosis not present

## 2021-04-19 NOTE — Progress Notes (Signed)
PCP:  Hoyt Koch, MD Primary Cardiologist: None  Electrophysiologist: Cristopher Peru, MD   [place] is a 87 y.o. female seen today for Cristopher Peru, MD for routine electrophysiology followup.  Since last being seen in our clinic the patient reports doing well overall from a cardiac standpoint. She has two sons in different parts of the country who are having health issues and it is weighing heavily on her. One is critically ill and hospitalized in [place]. Pt denies chest pain, palpitations, dyspnea, PND, orthopnea, nausea, vomiting, dizziness, syncope, edema, weight gain, or early satiety. Overall, she is doing well without specific complaint today.   Past Medical History:  Diagnosis Date  . A-fib ([place])    paroxysmal  . Aftercare for healing traumatic fracture of arm, unspecified   . H/O: hemorrhoidectomy   . H/O: hysterectomy   . Hyperlipidemia   . Hypertension   . Malignant melanoma of skin of trunk, except scrotum ([place])   . Other postprocedural status(V45.89)   . Rosacea   . S/P cholecystectomy   . Sciatica    right  . Sprain of lumbar region    Past Surgical History:  Procedure Laterality Date  . ABDOMINAL HYSTERECTOMY    . BLADDER SUSPENSION     never go surgery because it got canceled due to heart rate   . cataract Bilateral 2019  . catheter ablation of atrail flutter    . CHOLECYSTECTOMY    . HERNIA REPAIR      Current Outpatient Medications  Medication Sig Dispense Refill  . acetaminophen (TYLENOL) 500 MG tablet Take 1,000 mg by mouth every 6 (six) hours as needed for headache (pain). For pain    . ALPRAZolam (XANAX) 0.25 MG tablet TAKE 1 TABLET (0.25 MG TOTAL) BY MOUTH DAILY AS NEEDED. FOR ANXIETY 30 tablet 1  . amoxicillin (AMOXIL) 500 MG capsule Take 1 capsule (500 mg total) by mouth daily. 90 capsule 3  . apixaban (ELIQUIS) 5 MG TABS tablet Take 1 tablet (5 mg total) by mouth 2 (two) times daily. 180 tablet 1  . atorvastatin (LIPITOR) 20 MG  tablet TAKE 1 TABLET BY MOUTH EVERY DAY 90 tablet 1  . Calcium Carbonate-Vitamin D (CALCIUM-VITAMIN D) 500-200 MG-UNIT per tablet Take 1 tablet by mouth every other day.    . carboxymethylcellul-glycerin (REFRESH OPTIVE) 0.5-0.9 % ophthalmic solution Place 1 drop into both eyes as needed for dry eyes.    . Carboxymethylcellulose Sodium (THERATEARS OP) Place 1 drop into both eyes 2 (two) times daily as needed (dry eyes).    Marland Kitchen diltiazem (CARDIZEM CD) 120 MG 24 hr capsule Take 2 capsules (240 mg total) by mouth daily. 180 capsule 1  . docusate sodium (COLACE) 100 MG capsule Take 1 capsule (100 mg total) by mouth 2 (two) times daily. 10 capsule 0  . feeding supplement, GLUCERNA SHAKE, (GLUCERNA SHAKE) LIQD Take 237 mLs by mouth 2 (two) times daily between meals.  0  . furosemide (LASIX) 40 MG tablet Take 1 tablet (40 mg total) by mouth at bedtime. 9pm 90 tablet 1  . Hydrocortisone (GERHARDT'S BUTT CREAM) CREA Apply 1 application topically daily as needed for irritation.     . metoprolol tartrate (LOPRESSOR) 50 MG tablet TAKE 1 TABLET BY MOUTH TWICE A DAY 180 tablet 1  . Multiple Vitamins-Minerals (MULTIVITAMIN WITH MINERALS) tablet Take 1 tablet by mouth daily at 6 PM.    . Multiple Vitamins-Minerals (PRESERVISION AREDS 2) CAPS Take 1 capsule by mouth in the  morning and at bedtime.    . polyethylene glycol (MIRALAX / GLYCOLAX) 17 g packet Take 17 g by mouth daily. 14 each 0  . Probiotic Product (DAILY PROBIOTIC) CAPS Take 1 capsule by mouth daily. 30 billion live culture with 12  probiotic strains    . Triamcinolone Acetonide (TRIAMCINOLONE 0.1 % CREAM : EUCERIN) CREA Apply 1 application topically 2 (two) times daily as needed for rash, itching or irritation.    . Zinc Oxide (TRIPLE PASTE) 12.8 % ointment Apply 1 application topically as needed for irritation.     No current facility-administered medications for this visit.    Allergies  Allergen Reactions  . Antihistamines, Diphenhydramine-Type  Other (See Comments)    ALL ANTIHISTAMINES causes her heart to race  . Oxycodone-Acetaminophen Nausea Only    Social History   Socioeconomic History  . Marital status: Widowed    Spouse name: Not on file  . Number of children: 6  . Years of education: 81  . Highest education level: Not on file  Occupational History  . Occupation: IT trainer: RETIRED    Comment: retired  Tobacco Use  . Smoking status: Never Smoker  . Smokeless tobacco: Never Used  Substance and Sexual Activity  . Alcohol use: No  . Drug use: No  . Sexual activity: Never  Other Topics Concern  . Not on file  Social History Narrative   10th grade. Married '51.4 sons- '52, '54, '58, '70; 2 daughters- '56, '64 older girl died of ovarian cancer.11 grandchildren, 2 great grands. Retired Primary school teacher. ACP - wishes full code status: CPR, mechanical ventilator and all heroic measures.    Social Determinants of Health   Financial Resource Strain: Not on file  Food Insecurity: Not on file  Transportation Needs: Not on file  Physical Activity: Not on file  Stress: Not on file  Social Connections: Not on file  Intimate Partner Violence: Not on file   Review of Systems: All other systems reviewed and are otherwise negative except as noted above.  Physical Exam: Vitals:   04/19/21 1129  BP: 118/78  Pulse: 81  SpO2: 97%  Weight: 141 lb (64 kg)  Height: 5\' 4"  (1.626 m)    GEN- The patient is elderly and appearing, alert and oriented x 3 today.   HEENT: normocephalic, atraumatic; sclera clear, conjunctiva pink; hearing intact; oropharynx clear; neck supple, no JVP Lymph- no cervical lymphadenopathy Lungs- Clear to ausculation bilaterally, normal work of breathing.  No wheezes, rales, rhonchi Heart- Irregularly irregular rate and rhythm, no murmurs, rubs or gallops, PMI not laterally displaced GI- soft, non-tender, non-distended, bowel sounds present, no hepatosplenomegaly Extremities-  no clubbing, cyanosis, or edema; DP/PT/radial pulses 2+ bilaterally MS- no significant deformity or atrophy Skin- warm and dry, no rash or lesion Psych- euthymic mood, full affect Neuro- strength and sensation are intact  EKG is ordered. Personal review of EKG from today shows AF with controlled rates at 81 bpm  Additional studies reviewed include: Previous EP office notes. Previous EKG.   Assessment and Plan:  1. Longstanding Persistent atrial fibrillation On Eliquis chronically for CHA2DS2VASC of at least 5.  Denies bleeding Continue lopressor 50 mg BID and diltiazem 240 mg daily.  Mg today  2. Secondary Hypercoagulable State (ICD10:  D68.69) The patient is at high risk of stroke/thromboembolism with CHA2DS2VASC of at least 5 and remains on Eliquis as above for same.   CMET and CBC today for ongoing Eliquis management  3. HTN  Continue lopressor 50 mg BID.   4. LLE cellulitis Per PCP and ID.  She is on amoxicillin, chronically by history. I have recommended she follow up an Infectious Disease MD as chronic antibiotics may also lead to side effects.   5. Diastolic CHF Volume status stable on exam today.  Continue lasix 40 mg daily, with additional as needed. CMET today.   6. HLD Continue lipitor.   RTC 6 months with Dr. Lovena Le. Labs today  Shirley Friar, [place]  04/19/21 11:47 AM

## 2021-04-19 NOTE — Patient Instructions (Signed)
Medication Instructions:  Your physician recommends that you continue on your current medications as directed. Please refer to the Current Medication list given to you today.  *If you need a refill on your cardiac medications before your next appointment, please call your pharmacy*   Lab Work: TODAY: CMET, CBC, MAG  If you have labs (blood work) drawn today and your tests are completely normal, you will receive your results only by: Marland Kitchen MyChart Message (if you have MyChart) OR . A paper copy in the mail If you have any lab test that is abnormal or we need to change your treatment, we will call you to review the results.   Follow-Up: At [facility], you and your health needs are our priority.  As part of our continuing mission to provide you with exceptional heart care, we have created designated Provider Care Teams.  These Care Teams include your primary Cardiologist (physician) and Advanced Practice Providers (APPs -  Physician Assistants and Nurse Practitioners) who all work together to provide you with the care you need, when you need it.   Your next appointment:   6 month(s)  The format for your next appointment:   In Person  Provider:   You may see Cristopher Peru, MD or one of the following Advanced Practice Providers on your designated Care Team:    Chanetta Marshall, NP  Tommye Standard, PA-C  Legrand Como "[place]" [place], [place]

## 2021-04-20 LAB — COMPREHENSIVE METABOLIC PANEL
ALT: 26 IU/L (ref 0–32)
AST: 27 IU/L (ref 0–40)
Albumin/Globulin Ratio: 1.8 (ref 1.2–2.2)
Albumin: 4.7 g/dL — ABNORMAL HIGH (ref 3.6–4.6)
Alkaline Phosphatase: 102 IU/L (ref 44–121)
BUN/Creatinine Ratio: 22 (ref 12–28)
BUN: 17 mg/dL (ref 8–27)
Bilirubin Total: 1.9 mg/dL — ABNORMAL HIGH (ref 0.0–1.2)
CO2: 24 mmol/L (ref 20–29)
Calcium: 10.5 mg/dL — ABNORMAL HIGH (ref 8.7–10.3)
Chloride: 104 mmol/L (ref 96–106)
Creatinine, Ser: 0.79 mg/dL (ref 0.57–1.00)
Globulin, Total: 2.6 g/dL (ref 1.5–4.5)
Glucose: 139 mg/dL — ABNORMAL HIGH (ref 65–99)
Potassium: 4.6 mmol/L (ref 3.5–5.2)
Sodium: 143 mmol/L (ref 134–144)
Total Protein: 7.3 g/dL (ref 6.0–8.5)
eGFR: 72 mL/min/{1.73_m2} (ref >59)

## 2021-04-20 LAB — CBC
Hematocrit: 42.6 % (ref 34.0–46.6)
Hemoglobin: 14.3 g/dL (ref 11.1–15.9)
MCH: 30.4 pg (ref 26.6–33.0)
MCHC: 33.6 g/dL (ref 31.5–35.7)
MCV: 91 fL (ref 79–97)
Platelets: 229 10*3/uL (ref 150–450)
RBC: 4.7 x10E6/uL (ref 3.77–5.28)
RDW: 13.2 % (ref 11.7–15.4)
WBC: 6.1 10*3/uL (ref 3.4–10.8)

## 2021-04-20 LAB — MAGNESIUM: Magnesium: 2.4 mg/dL — ABNORMAL HIGH (ref 1.6–2.3)

## 2021-07-08 DIAGNOSIS — H4312 Vitreous hemorrhage, left eye: Secondary | ICD-10-CM | POA: Diagnosis not present

## 2021-07-08 DIAGNOSIS — H353221 Exudative age-related macular degeneration, left eye, with active choroidal neovascularization: Secondary | ICD-10-CM | POA: Diagnosis not present

## 2021-07-08 DIAGNOSIS — H353111 Nonexudative age-related macular degeneration, right eye, early dry stage: Secondary | ICD-10-CM | POA: Diagnosis not present

## 2021-07-13 ENCOUNTER — Other Ambulatory Visit: Payer: Self-pay | Admitting: Student

## 2021-07-13 NOTE — Telephone Encounter (Signed)
Pt last saw Barrington Ellison, [place] on 04/19/21, last labs 04/19/21 Creat 0.79, age 87, weight 64kg, based on specified criteria pt is on appropriate dosage of Eliquis '5mg'$  BID.  Will refill rx.

## 2021-08-01 ENCOUNTER — Telehealth: Payer: Self-pay | Admitting: Internal Medicine

## 2021-08-01 NOTE — Telephone Encounter (Signed)
LVM for pt to rtn my call to schedule AWV with [facility]. Please schedule AWV if pt calls the office  

## 2021-08-12 DIAGNOSIS — H353221 Exudative age-related macular degeneration, left eye, with active choroidal neovascularization: Secondary | ICD-10-CM | POA: Diagnosis not present

## 2021-08-12 DIAGNOSIS — H4312 Vitreous hemorrhage, left eye: Secondary | ICD-10-CM | POA: Diagnosis not present

## 2021-08-24 ENCOUNTER — Telehealth: Payer: Self-pay | Admitting: *Deleted

## 2021-08-24 DIAGNOSIS — I700 Atherosclerosis of aorta: Secondary | ICD-10-CM | POA: Insufficient documentation

## 2021-08-24 NOTE — Telephone Encounter (Signed)
   Name: Shatiqua Heroux [facility]  DOB: 12-04-1930  MRN: [id number]   Primary Cardiologist: Cristopher Peru, MD  Chart reviewed as part of pre-operative protocol coverage.  Left a voicemail for patient to call back for ongoing preop assessment.  Abigail Butts, PA-C 08/24/2021, 3:11 PM

## 2021-08-24 NOTE — Telephone Encounter (Signed)
Patient with diagnosis of afib on Eliquis for anticoagulation.    Procedure: VITRECTOMY w/ENDO LASER-LEFT  Date of procedure: 08/30/21   CHA2DS2-VASc Score = 7  This indicates a 11.2% annual risk of stroke. The patient's score is based upon: CHF History: 1 HTN History: 1 Diabetes History: 1 Stroke History: 0 Vascular Disease History: 1 Age Score: 2 Gender Score: 1   CrCl 63mL/min Platelet count 229K  Would see if MD is ok with pt holding Eliquis for only 1 day prior to procedure given elevated CV risk. If 2 day hold is required, will need MD clearance.

## 2021-08-24 NOTE — Telephone Encounter (Signed)
   [name] [name] [facility] Pre-operative Risk Assessment    Patient Name: [name] [name] [facility]  DOB: Dec 24, 1930 MRN: [id number]  HEARTCARE STAFF:  - IMPORTANT!!!!!! Under Visit Info/Reason for Call, type in Other and utilize the format Clearance MM/DD/YY or Clearance TBD. Do not use dashes or single digits. - Please review there is not already an duplicate clearance open for this procedure. - If request is for dental extraction, please clarify the # of teeth to be extracted. - If the patient is currently at the dentist's office, call Pre-Op Callback Staff (MA/nurse) to input urgent request.  - If the patient is not currently in the dentist office, please route to the Pre-Op pool.  Request for surgical clearance:  What type of surgery is being performed? VITRECTOMY w/ENDO LASER-LEFT   When is this surgery scheduled? 08/30/21  What type of clearance is required (medical clearance vs. Pharmacy clearance to hold med vs. Both)? BOTH  Are there any medications that need to be held prior to surgery and how long?  ELIQUIS x 2 DAYS PER REQUEST OF DR. [place] name and name of physician performing surgery? [facility]; DR/ Nevin Bloodgood PECEN  What is the office phone number? [phone]   4.   What is the office fax number? [phone]  8.   Anesthesia type (None, local, MAC, general) ? IV SEDATION   Julaine Hua 08/24/2021, 9:23 AM  _________________________________________________________________   (provider comments below)

## 2021-08-24 NOTE — Telephone Encounter (Signed)
   Name: [name] [name] [facility]  DOB: 10-08-1931  MRN: [id number]   Primary Cardiologist: Cristopher Peru, MD   Chart reviewed as part of pre-operative protocol coverage. Patient was contacted 08/24/2021 in reference to pre-operative risk assessment for pending surgery as outlined below.  [name] [name] [facility] was last seen on 04/19/21 by Oda Kilts, PA-C.  Since that day, [name] [name] [facility] has done fine from a cardiac standpoint. She has been limited in activity by her eye condition - not able to do strenuous activity to avoid increased pressure and limited in walking/exercise due to vision impairment and concern for falls. She is able to ambulate indoors, perform household chores, and do her ADLs without anginal complaints.   Therefore, based on ACC/AHA guidelines, the patient would be at acceptable risk for the planned procedure without further cardiovascular testing.   The patient was advised that if she develops new symptoms prior to surgery to contact our office to arrange for a follow-up visit, and she verbalized understanding.  Per pharmacy recommendations, patient can hold eliquis 1 day prior to her upcoming procedure procedure with plans to restart when cleared to do so by her surgeon.   I will route this recommendation to the requesting party via Epic fax function and remove from pre-op pool. Please call with questions.  Abigail Butts, PA-C 08/24/2021, 3:18 PM

## 2021-08-30 DIAGNOSIS — H4312 Vitreous hemorrhage, left eye: Secondary | ICD-10-CM | POA: Diagnosis not present

## 2021-08-30 DIAGNOSIS — Z01818 Encounter for other preprocedural examination: Secondary | ICD-10-CM | POA: Diagnosis not present

## 2021-10-10 ENCOUNTER — Other Ambulatory Visit: Payer: Self-pay | Admitting: Internal Medicine

## 2021-10-13 ENCOUNTER — Other Ambulatory Visit: Payer: Self-pay | Admitting: Internal Medicine

## 2021-10-13 NOTE — Telephone Encounter (Signed)
Left VM asking patient to return my call to schedule 1 year follow up.   [place], CMA

## 2021-10-14 DIAGNOSIS — H348121 Central retinal vein occlusion, left eye, with retinal neovascularization: Secondary | ICD-10-CM | POA: Diagnosis not present

## 2021-10-17 ENCOUNTER — Encounter: Payer: Self-pay | Admitting: Internal Medicine

## 2021-10-17 ENCOUNTER — Other Ambulatory Visit: Payer: Self-pay

## 2021-10-17 ENCOUNTER — Ambulatory Visit (INDEPENDENT_AMBULATORY_CARE_PROVIDER_SITE_OTHER): Payer: Medicare Other | Admitting: Internal Medicine

## 2021-10-17 VITALS — BP 139/84 | HR 78 | Temp 97.3°F | Resp 16 | Ht 64.0 in | Wt 143.6 lb

## 2021-10-17 DIAGNOSIS — Z23 Encounter for immunization: Secondary | ICD-10-CM | POA: Diagnosis not present

## 2021-10-17 DIAGNOSIS — L03119 Cellulitis of unspecified part of limb: Secondary | ICD-10-CM

## 2021-10-17 DIAGNOSIS — Z7185 Encounter for immunization safety counseling: Secondary | ICD-10-CM | POA: Insufficient documentation

## 2021-10-17 MED ORDER — AMOXICILLIN 500 MG PO CAPS
ORAL_CAPSULE | ORAL | 3 refills | Status: DC
Start: 2021-10-17 — End: 2022-10-23

## 2021-10-17 NOTE — Assessment & Plan Note (Addendum)
She seems to be doing well on amoxicillin for prevention/prophylaxis of cellulitis.  Will continue this daily and have her RTC in 1 year, sooner if needed.  If she were to develop a flareup, would recommend amoxicillin 3 times daily for a period of 5 to 7 days.  Will check labs today with BMP, CBC.  Refill sent today for amoxicillin 500mg  PO daily.

## 2021-10-17 NOTE — Progress Notes (Signed)
[facility] for Infectious Disease  CHIEF COMPLAINT:    Follow up for cellulitis  SUBJECTIVE:    [name] [name] is a 87 y.o. female with PMHx as below who presents to the clinic for cellulitis.   Patient was last seen in November 2021 by my partner, Dr. Linus Salmons.  She is currently on amoxicillin 500 mg daily for suppression of recurrent cellulitis.  She has been tolerating this well without any side effects and has had no further episodes of cellulitis.  Please see A&P for the details of today's visit and status of the patient's medical problems.   Patient's Medications  New Prescriptions   No medications on file  Previous Medications   ACETAMINOPHEN (TYLENOL) 500 MG TABLET    Take 1,000 mg by mouth every 6 (six) hours as needed for headache (pain). For pain   ALPRAZOLAM (XANAX) 0.25 MG TABLET    TAKE 1 TABLET (0.25 MG TOTAL) BY MOUTH DAILY AS NEEDED. FOR ANXIETY   ATORVASTATIN (LIPITOR) 20 MG TABLET    TAKE 1 TABLET BY MOUTH EVERY DAY   CALCIUM CARBONATE-VITAMIN D (CALCIUM-VITAMIN D) 500-200 MG-UNIT PER TABLET    Take 1 tablet by mouth every other day.   CARBOXYMETHYLCELLUL-GLYCERIN (REFRESH OPTIVE) 0.5-0.9 % OPHTHALMIC SOLUTION    Place 1 drop into both eyes as needed for dry eyes.   CARBOXYMETHYLCELLULOSE SODIUM (THERATEARS OP)    Place 1 drop into both eyes 2 (two) times daily as needed (dry eyes).   DILTIAZEM (CARDIZEM CD) 120 MG 24 HR CAPSULE    Take 2 capsules (240 mg total) by mouth daily.   DOCUSATE SODIUM (COLACE) 100 MG CAPSULE    Take 1 capsule (100 mg total) by mouth 2 (two) times daily.   ELIQUIS 5 MG TABS TABLET    TAKE 1 TABLET BY MOUTH TWICE A DAY   FEEDING SUPPLEMENT, GLUCERNA SHAKE, (GLUCERNA SHAKE) LIQD    Take 237 mLs by mouth 2 (two) times daily between meals.   FUROSEMIDE (LASIX) 40 MG TABLET    Take 1 tablet (40 mg total) by mouth at bedtime. 9pm   HYDROCORTISONE (GERHARDT'S BUTT CREAM) CREA    Apply 1 application topically daily as needed for  irritation.    METOPROLOL TARTRATE (LOPRESSOR) 50 MG TABLET    Take 1 tablet (50 mg total) by mouth 2 (two) times daily.   MULTIPLE VITAMINS-MINERALS (MULTIVITAMIN WITH MINERALS) TABLET    Take 1 tablet by mouth daily at 6 PM.   MULTIPLE VITAMINS-MINERALS (PRESERVISION AREDS 2) CAPS    Take 1 capsule by mouth in the morning and at bedtime.   OFLOXACIN (OCUFLOX) 0.3 % OPHTHALMIC SOLUTION    Place 1 drop into the left eye 4 (four) times daily.   POLYETHYLENE GLYCOL (MIRALAX / GLYCOLAX) 17 G PACKET    Take 17 g by mouth daily.   PREDNISOLONE ACETATE (PRED FORTE) 1 % OPHTHALMIC SUSPENSION    PLEASE SEE ATTACHED FOR DETAILED DIRECTIONS   PROBIOTIC PRODUCT (DAILY PROBIOTIC) CAPS    Take 1 capsule by mouth daily. 30 billion live culture with 12  probiotic strains   TRIAMCINOLONE ACETONIDE (TRIAMCINOLONE 0.1 % CREAM : EUCERIN) CREA    Apply 1 application topically 2 (two) times daily as needed for rash, itching or irritation.   ZINC OXIDE (TRIPLE PASTE) 12.8 % OINTMENT    Apply 1 application topically as needed for irritation.  Modified Medications   Modified Medication Previous Medication   AMOXICILLIN (AMOXIL) 500  MG CAPSULE amoxicillin (AMOXIL) 500 MG capsule      TAKE 1 CAPSULE BY MOUTH EVERY DAY    TAKE 1 CAPSULE BY MOUTH EVERY DAY  Discontinued Medications   No medications on file      Past Medical History:  Diagnosis Date   A-fib ([place])    paroxysmal   Aftercare for healing traumatic fracture of arm, unspecified    H/O: hemorrhoidectomy    H/O: hysterectomy    Hyperlipidemia    Hypertension    Malignant melanoma of skin of trunk, except scrotum (HCC)    Other postprocedural status(V45.89)    Rosacea    S/P cholecystectomy    Sciatica    right   Sprain of lumbar region     Social History   Tobacco Use   Smoking status: Never   Smokeless tobacco: Never  Substance Use Topics   Alcohol use: No   Drug use: No    Family History  Problem Relation Age of Onset   Heart failure  Mother    Heart failure Father    Liver cancer Daughter     Allergies  Allergen Reactions   Antihistamines, Diphenhydramine-Type Other (See Comments)    ALL ANTIHISTAMINES causes her heart to race   Oxycodone-Acetaminophen Nausea Only    Review of Systems  Constitutional: Negative.   Gastrointestinal: Negative.   Skin: Negative.     OBJECTIVE:    Vitals:   10/17/21 1353  BP: 139/84  Pulse: 78  Resp: 16  Temp: (!) 97.3 F (36.3 C)  SpO2: 96%  Weight: 143 lb 9.6 oz (65.1 kg)  Height: 5\' 4"  (1.626 m)   Body mass index is 24.65 kg/m.  Physical Exam Constitutional:      General: She is not in acute distress.    Appearance: Normal appearance.  HENT:     Head: Normocephalic and atraumatic.  Eyes:     Extraocular Movements: Extraocular movements intact.  Pulmonary:     Effort: Pulmonary effort is normal. No respiratory distress.  Skin:    Comments: Lower extremities with chronic changes but no warmth or erythema.  No swelling.   Neurological:     General: No focal deficit present.     Mental Status: She is alert and oriented to person, place, and time.  Psychiatric:        Mood and Affect: Mood normal.        Behavior: Behavior normal.     Labs and Microbiology: CBC Latest Ref Rng & Units 04/19/2021 10/06/2020 08/09/2020  WBC 3.4 - 10.8 x10E3/uL 6.1 4.9 8.8  Hemoglobin 11.1 - 15.9 g/dL 14.3 12.8 11.6(L)  Hematocrit 34.0 - 46.6 % 42.6 39.1 37.0  Platelets 150 - 450 x10E3/uL 229 261 516(H)   CMP Latest Ref Rng & Units 04/19/2021 10/06/2020 08/09/2020  Glucose 65 - 99 mg/dL 139(H) 164(H) 174(H)  BUN 8 - 27 mg/dL 17 14 14   Creatinine 0.57 - 1.00 mg/dL 0.79 0.65 0.52  Sodium 134 - 144 mmol/L 143 139 136  Potassium 3.5 - 5.2 mmol/L 4.6 3.7 4.2  Chloride 96 - 106 mmol/L 104 102 101  CO2 20 - 29 mmol/L 24 24 25   Calcium 8.7 - 10.3 mg/dL 10.5(H) 10.3 9.1  Total Protein 6.0 - 8.5 g/dL 7.3 8.1 6.8  Total Bilirubin 0.0 - 1.2 mg/dL 1.9(H) 1.6(H) 0.9  Alkaline Phos 44 -  121 IU/L 102 128(H) 118  AST 0 - 40 IU/L 27 21 44(H)  ALT 0 - 32 IU/L 26  12 45(H)      ASSESSMENT & PLAN:    Recurrent cellulitis of lower extremity She seems to be doing well on amoxicillin for prevention/prophylaxis of cellulitis.  Will continue this daily and have her RTC in 1 year, sooner if needed.  If she were to develop a flareup, would recommend amoxicillin 3 times daily for a period of 5 to 7 days.  Will check labs today with BMP, CBC.  Refill sent today for amoxicillin 500mg  PO daily.  Vaccine counseling Flu shot given today.  She also would like covid bivalent booster but he do not have available today.  Advised to have done at local pharmacy.   Orders Placed This Encounter  Procedures   CBC   Basic metabolic panel    Order Specific Question:   Has the patient fasted?    Answer:   No           Raynelle Highland for Infectious Disease [facility] 10/17/2021, 2:09 PM

## 2021-10-17 NOTE — Patient Instructions (Signed)
Thank you for coming to see me today. It was a pleasure seeing you.  To Do: Labs today  Continue Amoxicillin daily Follow up in 1 year, sooner if needed  If you have any questions or concerns, please do not hesitate to call the office at [phone].  Take Care,   [name] [name]

## 2021-10-17 NOTE — Assessment & Plan Note (Signed)
Flu shot given today.  She also would like covid bivalent booster but he do not have available today.  Advised to have done at local pharmacy.

## 2021-10-18 ENCOUNTER — Other Ambulatory Visit: Payer: Self-pay | Admitting: Internal Medicine

## 2021-10-18 ENCOUNTER — Telehealth: Payer: Self-pay

## 2021-10-18 LAB — CBC
HCT: 41.5 % (ref 35.0–45.0)
Hemoglobin: 13.6 g/dL (ref 11.7–15.5)
MCH: 30.4 pg (ref 27.0–33.0)
MCHC: 32.8 g/dL (ref 32.0–36.0)
MCV: 92.8 fL (ref 80.0–100.0)
MPV: 9.9 fL (ref 7.5–12.5)
Platelets: 264 10*3/uL (ref 140–400)
RBC: 4.47 10*6/uL (ref 3.80–5.10)
RDW: 12.6 % (ref 11.0–15.0)
WBC: 7.6 10*3/uL (ref 3.8–10.8)

## 2021-10-18 LAB — BASIC METABOLIC PANEL
BUN: 19 mg/dL (ref 7–25)
CO2: 28 mmol/L (ref 20–32)
Calcium: 10.1 mg/dL (ref 8.6–10.4)
Chloride: 107 mmol/L (ref 98–110)
Creat: 0.74 mg/dL (ref 0.60–0.95)
Glucose, Bld: 113 mg/dL (ref 65–139)
Potassium: 4.6 mmol/L (ref 3.5–5.3)
Sodium: 144 mmol/L (ref 135–146)

## 2021-10-18 NOTE — Telephone Encounter (Signed)
Patient returned call, RN let her know lab work was normal. Patient verbalized understanding and has no further questions.   Beryle Flock, RN

## 2021-10-18 NOTE — Telephone Encounter (Signed)
Called patient to relay results, no answer. MyChart message sent.   Beryle Flock, RN

## 2021-10-18 NOTE — Telephone Encounter (Signed)
-----   Message from Mignon Pine, DO sent at 10/18/2021  2:53 PM EST ----- Can you please let patient know that her labs from yesterday were normal.  Thanks

## 2021-10-22 ENCOUNTER — Other Ambulatory Visit: Payer: Self-pay | Admitting: Internal Medicine

## 2021-10-25 ENCOUNTER — Telehealth: Payer: Self-pay | Admitting: Internal Medicine

## 2021-10-25 NOTE — Telephone Encounter (Signed)
1.Medication Requested: diltiazem (CARDIZEM CD) 120 MG 24 hr capsule 2. Pharmacy (Name, Street, [place]): [id number] - [place], [place]. Phone:  [phone]  Fax:  [phone]     3. On Med List: y  78. Last Visit with PCP: 1.14.2022  5. Next visit date with PCP: n/a   Pt. Is completely out of medication. Requesting 90 day supply. Also, wanting to know name of last COVID vaccine?   Agent: Please be advised that RX refills may take up to 3 business days. We ask that you follow-up with your pharmacy.

## 2021-10-26 ENCOUNTER — Ambulatory Visit: Payer: Medicare Other | Admitting: Internal Medicine

## 2021-10-26 MED ORDER — DILTIAZEM HCL ER COATED BEADS 120 MG PO CP24: 240.0000 mg | ORAL_CAPSULE | Freq: Every day | ORAL | 1 refills | Status: DC

## 2021-10-26 NOTE — Telephone Encounter (Signed)
Sent in rx. If we have last covid-19 okay to give name of this or she can call place she got the vaccine.

## 2022-01-13 DIAGNOSIS — H26492 Other secondary cataract, left eye: Secondary | ICD-10-CM | POA: Diagnosis not present

## 2022-01-13 DIAGNOSIS — H348121 Central retinal vein occlusion, left eye, with retinal neovascularization: Secondary | ICD-10-CM | POA: Diagnosis not present

## 2022-01-13 DIAGNOSIS — H401132 Primary open-angle glaucoma, bilateral, moderate stage: Secondary | ICD-10-CM | POA: Diagnosis not present

## 2022-01-14 ENCOUNTER — Other Ambulatory Visit: Payer: Self-pay | Admitting: Internal Medicine

## 2022-01-16 ENCOUNTER — Other Ambulatory Visit: Payer: Self-pay | Admitting: Internal Medicine

## 2022-01-16 NOTE — Telephone Encounter (Signed)
Pt last saw [name] [name], [place] on 04/20/21, last labs 10/17/21 Creat 0.74, age 88, weight 65.1kg, based on specified criteria pt is on appropriate dosage of Eliquis 5mg  BID for afib.  Will refill rx.

## 2022-01-20 DIAGNOSIS — H348121 Central retinal vein occlusion, left eye, with retinal neovascularization: Secondary | ICD-10-CM | POA: Diagnosis not present

## 2022-01-20 DIAGNOSIS — Z961 Presence of intraocular lens: Secondary | ICD-10-CM | POA: Diagnosis not present

## 2022-01-20 DIAGNOSIS — H40003 Preglaucoma, unspecified, bilateral: Secondary | ICD-10-CM | POA: Diagnosis not present

## 2022-01-24 ENCOUNTER — Other Ambulatory Visit: Payer: Self-pay | Admitting: Internal Medicine

## 2022-02-06 DIAGNOSIS — Z1231 Encounter for screening mammogram for malignant neoplasm of breast: Secondary | ICD-10-CM | POA: Diagnosis not present

## 2022-03-17 DIAGNOSIS — H401132 Primary open-angle glaucoma, bilateral, moderate stage: Secondary | ICD-10-CM | POA: Diagnosis not present

## 2022-04-18 ENCOUNTER — Other Ambulatory Visit: Payer: Self-pay | Admitting: Internal Medicine

## 2022-04-21 ENCOUNTER — Telehealth: Payer: Self-pay

## 2022-04-21 MED ORDER — DILTIAZEM HCL ER COATED BEADS 120 MG PO CP24: 240.0000 mg | ORAL_CAPSULE | Freq: Every day | ORAL | 1 refills | Status: DC

## 2022-04-21 MED ORDER — ATORVASTATIN CALCIUM 20 MG PO TABS: 20.0000 mg | ORAL_TABLET | Freq: Every day | ORAL | 1 refills | Status: DC

## 2022-04-21 NOTE — Telephone Encounter (Signed)
Refill has been sent to the pt's pharmacy  

## 2022-04-21 NOTE — Telephone Encounter (Signed)
Pt is asking for refill: atorvastatin (LIPITOR) 20 MG tablet diltiazem (CARDIZEM CD) 120 MG 24 hr capsule  Pharmacy: [id number]- [place] [place]  LOV 12/10/20 ROV 05/08/22

## 2022-04-26 ENCOUNTER — Other Ambulatory Visit: Payer: Self-pay | Admitting: Internal Medicine

## 2022-05-08 ENCOUNTER — Ambulatory Visit: Payer: Medicare Other | Admitting: Internal Medicine

## 2022-05-15 ENCOUNTER — Encounter: Payer: Self-pay | Admitting: Internal Medicine

## 2022-05-15 ENCOUNTER — Ambulatory Visit (INDEPENDENT_AMBULATORY_CARE_PROVIDER_SITE_OTHER): Payer: Medicare Other | Admitting: Internal Medicine

## 2022-05-15 VITALS — BP 100/64 | HR 105 | Temp 98.1°F | Ht 64.0 in | Wt 134.0 lb

## 2022-05-15 DIAGNOSIS — I10 Essential (primary) hypertension: Secondary | ICD-10-CM | POA: Diagnosis not present

## 2022-05-15 DIAGNOSIS — Z0000 Encounter for general adult medical examination without abnormal findings: Secondary | ICD-10-CM

## 2022-05-15 DIAGNOSIS — E782 Mixed hyperlipidemia: Secondary | ICD-10-CM

## 2022-05-15 DIAGNOSIS — I4821 Permanent atrial fibrillation: Secondary | ICD-10-CM | POA: Diagnosis not present

## 2022-05-15 DIAGNOSIS — R7303 Prediabetes: Secondary | ICD-10-CM

## 2022-05-15 DIAGNOSIS — F419 Anxiety disorder, unspecified: Secondary | ICD-10-CM | POA: Diagnosis not present

## 2022-05-15 DIAGNOSIS — E119 Type 2 diabetes mellitus without complications: Secondary | ICD-10-CM

## 2022-05-15 DIAGNOSIS — I700 Atherosclerosis of aorta: Secondary | ICD-10-CM

## 2022-05-15 DIAGNOSIS — I5032 Chronic diastolic (congestive) heart failure: Secondary | ICD-10-CM | POA: Diagnosis not present

## 2022-05-15 LAB — COMPREHENSIVE METABOLIC PANEL
ALT: 27 U/L (ref 0–35)
AST: 31 U/L (ref 0–37)
Albumin: 4.4 g/dL (ref 3.5–5.2)
Alkaline Phosphatase: 104 U/L (ref 39–117)
BUN: 23 mg/dL (ref 6–23)
CO2: 31 mEq/L (ref 19–32)
Calcium: 10.7 mg/dL — ABNORMAL HIGH (ref 8.4–10.5)
Chloride: 105 mEq/L (ref 96–112)
Creatinine, Ser: 0.7 mg/dL (ref 0.40–1.20)
GFR: 77.11 mL/min (ref >60.00)
Glucose, Bld: 148 mg/dL — ABNORMAL HIGH (ref 70–99)
Potassium: 3.8 mEq/L (ref 3.5–5.1)
Sodium: 143 mEq/L (ref 135–145)
Total Bilirubin: 1.7 mg/dL — ABNORMAL HIGH (ref 0.2–1.2)
Total Protein: 7.6 g/dL (ref 6.0–8.3)

## 2022-05-15 LAB — CBC
HCT: 41.4 % (ref 36.0–46.0)
Hemoglobin: 13.6 g/dL (ref 12.0–15.0)
MCHC: 32.9 g/dL (ref 30.0–36.0)
MCV: 92.2 fl (ref 78.0–100.0)
Platelets: 231 10*3/uL (ref 150.0–400.0)
RBC: 4.49 Mil/uL (ref 3.87–5.11)
RDW: 14.7 % (ref 11.5–15.5)
WBC: 5.9 10*3/uL (ref 4.0–10.5)

## 2022-05-15 LAB — MICROALBUMIN / CREATININE URINE RATIO
Creatinine,U: 118.5 mg/dL
Microalb Creat Ratio: 3.9 mg/g (ref 0.0–30.0)
Microalb, Ur: 4.6 mg/dL — ABNORMAL HIGH (ref 0.0–1.9)

## 2022-05-15 LAB — LIPID PANEL
Cholesterol: 146 mg/dL (ref 0–200)
HDL: 74.4 mg/dL (ref >39.00)
LDL Cholesterol: 60 mg/dL (ref 0–99)
NonHDL: 71.68
Total CHOL/HDL Ratio: 2
Triglycerides: 57 mg/dL (ref 0.0–149.0)
VLDL: 11.4 mg/dL (ref 0.0–40.0)

## 2022-05-15 LAB — HEMOGLOBIN A1C: Hgb A1c MFr Bld: 5.9 % (ref 4.6–6.5)

## 2022-05-15 MED ORDER — ALPRAZOLAM 0.25 MG PO TABS: 0.2500 mg | ORAL_TABLET | Freq: Every day | ORAL | 1 refills | Status: DC | PRN

## 2022-05-15 MED ORDER — ATORVASTATIN CALCIUM 20 MG PO TABS
20.0000 mg | ORAL_TABLET | Freq: Every day | ORAL | 3 refills | Status: DC
Start: 2022-05-15 — End: 2023-05-24

## 2022-05-15 MED ORDER — METOPROLOL TARTRATE 50 MG PO TABS: 50.0000 mg | ORAL_TABLET | Freq: Two times a day (BID) | ORAL | 3 refills | Status: DC

## 2022-05-15 MED ORDER — APIXABAN 5 MG PO TABS: 5.0000 mg | ORAL_TABLET | Freq: Two times a day (BID) | ORAL | 3 refills | Status: DC

## 2022-05-15 MED ORDER — DILTIAZEM HCL ER COATED BEADS 120 MG PO CP24
240.0000 mg | ORAL_CAPSULE | Freq: Every day | ORAL | 3 refills | Status: DC
Start: 2022-05-15 — End: 2023-05-24

## 2022-05-15 MED ORDER — FUROSEMIDE 40 MG PO TABS: 40.0000 mg | ORAL_TABLET | Freq: Every day | ORAL | 3 refills | Status: DC

## 2022-05-15 NOTE — Progress Notes (Unsigned)
Subjective:   Patient ID: [name] [name] [facility], female    DOB: May 05, 1931, 88 y.o.   MRN: [id number]  HPI Here for medicare wellness and follow up, no new complaints. Please see A/P for status and treatment of chronic medical problems.   Diet: heart healthy Physical activity: sedentary, uses cane Depression/mood screen: negative Hearing: intact to whispered voice Visual acuity: grossly normal with lens, some peripheral vision loss, performs annual eye exam  ADLs: capable Fall risk: none Home safety: good Cognitive evaluation: intact to orientation, naming, recall and repetition EOL planning: adv directives discussed  [place] Visit from 05/15/2022 in [place] at [facility] Total Score 0           08/11/2020   10:25 PM 08/12/2020    8:00 AM 12/10/2020    2:21 PM 10/17/2021    1:44 PM 05/15/2022    2:22 PM  Fall Risk  Falls in the past year?   0 0 0  Was there an injury with Fall?    0   Fall Risk Category Calculator    0   Fall Risk Category    Low   Patient Fall Risk Level Moderate fall risk Moderate fall risk       I have personally reviewed and have noted 1. The patient's medical and social history - reviewed today no changes 2. Their use of alcohol, tobacco or illicit drugs 3. Their current medications and supplements 4. The patient's functional ability including ADL's, fall risks, home safety risks and hearing or visual impairment. 5. Diet and physical activities 6. Evidence for depression or mood disorders 7. Care team reviewed and updated 8.  The patient is not on an opioid pain medication.  Patient Care Team: Hoyt Koch, MD as PCP - General (Internal Medicine) Evans Lance, MD as PCP - Electrophysiology (Cardiology) Evans Lance, MD (Cardiology) Thalia Bloodgood, [place] (Optometry) [place], Tamala Fothergill, DPM (Podiatry) [place], Cleaster Corin, [place] (Pharmacist) Charlton Haws, [facility] as Pharmacist (Pharmacist) Past Medical  History:  Diagnosis Date   A-fib [facility])    paroxysmal   Aftercare for healing traumatic fracture of arm, unspecified    H/O: hemorrhoidectomy    H/O: hysterectomy    Hyperlipidemia    Hypertension    Malignant melanoma of skin of trunk, except scrotum ([place])    Other postprocedural status(V45.89)    Rosacea    S/P cholecystectomy    Sciatica    right   Sprain of lumbar region    Past Surgical History:  Procedure Laterality Date   ABDOMINAL HYSTERECTOMY     BLADDER SUSPENSION     never go surgery because it got canceled due to heart rate    cataract Bilateral 2019   catheter ablation of atrail flutter     CHOLECYSTECTOMY     HERNIA REPAIR     Family History  Problem Relation Age of Onset   Heart failure Mother    Heart failure Father    Liver cancer Daughter    Review of Systems  Constitutional:  Positive for activity change and appetite change.  HENT: Negative.    Eyes: Negative.   Respiratory:  Negative for cough, chest tightness and shortness of breath.   Cardiovascular:  Negative for chest pain, palpitations and leg swelling.  Gastrointestinal:  Negative for abdominal distention, abdominal pain, constipation, diarrhea, nausea and vomiting.  Musculoskeletal:  Positive for arthralgias.  Skin:  Positive for color change.  Neurological: Negative.  Psychiatric/Behavioral: Negative.      Objective:  Physical Exam Constitutional:      Appearance: She is well-developed.  HENT:     Head: Normocephalic and atraumatic.  Cardiovascular:     Rate and Rhythm: Normal rate. Rhythm irregular.  Pulmonary:     Effort: Pulmonary effort is normal. No respiratory distress.     Breath sounds: Normal breath sounds. No wheezing or rales.  Abdominal:     General: Bowel sounds are normal. There is no distension.     Palpations: Abdomen is soft.     Tenderness: There is no abdominal tenderness. There is no rebound.  Musculoskeletal:     Cervical back: Normal range of motion.   Skin:    General: Skin is warm and dry.     Comments: Foot exam done  Neurological:     Mental Status: She is alert and oriented to person, place, and time.     Coordination: Coordination abnormal.     Comments: Cane      Vitals:   05/15/22 1414  BP: 100/64  Pulse: (!) 105  Temp: 98.1 F (36.7 C)  TempSrc: Oral  SpO2: 98%  Weight: 134 lb (60.8 kg)  Height: '5\' 4"'$  (1.626 m)    Assessment & Plan:

## 2022-05-15 NOTE — Patient Instructions (Signed)
We have sent in the refills.  

## 2022-05-16 NOTE — Assessment & Plan Note (Signed)
Checking HgA1c and since this was in the pre-diabetic range and not taking medications for more than 1 year will change this to pre-diabetes. Foot exam was done at visit and checked microalbumin to creatinine ratio as well. She is taking statin.

## 2022-05-16 NOTE — Assessment & Plan Note (Signed)
BP at goal/slightly low. No dizziness will monitor closely. Taking lasix 40 mg daily (advised okay to go to every other day or prn for fluid) and continue diltiazem 240 mg daily. Checking CMP and adjust as needed.

## 2022-05-16 NOTE — Assessment & Plan Note (Signed)
Flu shot yearly. Covid-19 counseled. Pneumonia complete. Shingrix counseled due at pharmacy. Tetanus due later 2023. Colonoscopy aged out. Mammogram aged out, pap smear aged out and dexa complete. Counseled about sun safety and mole surveillance. Counseled about the dangers of distracted driving. Given 10 year screening recommendations.

## 2022-05-16 NOTE — Assessment & Plan Note (Signed)
Uses rare alprazolam and will refill today. Is last remaining sibling so this has been a rough year on her.

## 2022-05-16 NOTE — Assessment & Plan Note (Signed)
Checking lipid panel and adjust lipitor 20 mg daily as needed. 

## 2022-05-16 NOTE — Assessment & Plan Note (Addendum)
Sounds in A fib today. Taking diltiazem 240 mg daily and metoprolol 50 mg BID for rate control and eliquis 5 mg BID for anticoagulation. Checking CBC and CMP and adjust as needed.

## 2022-05-16 NOTE — Assessment & Plan Note (Signed)
No flare today. Advised it is okay to move lasix 40 mg to every other day and is successful to prn for fluid. She does have low end of normal BP readings.

## 2022-05-16 NOTE — Assessment & Plan Note (Addendum)
Taking lipitor 20 mg daily and will continue to lower CV risk.

## 2022-06-30 ENCOUNTER — Ambulatory Visit (INDEPENDENT_AMBULATORY_CARE_PROVIDER_SITE_OTHER): Payer: Medicare Other | Admitting: Internal Medicine

## 2022-06-30 ENCOUNTER — Encounter: Payer: Self-pay | Admitting: Internal Medicine

## 2022-06-30 VITALS — BP 130/66 | HR 84 | Ht 64.0 in | Wt 139.0 lb

## 2022-06-30 DIAGNOSIS — I10 Essential (primary) hypertension: Secondary | ICD-10-CM

## 2022-06-30 DIAGNOSIS — I4821 Permanent atrial fibrillation: Secondary | ICD-10-CM | POA: Diagnosis not present

## 2022-06-30 NOTE — Patient Instructions (Addendum)
Medication Instructions:  Your physician recommends that you continue on your current medications as directed. Please refer to the Current Medication list given to you today.  *If you need a refill on your cardiac medications before your next appointment, please call your pharmacy*  Lab Work: None ordered.  If you have labs (blood work) drawn today and your tests are completely normal, you will receive your results only by: [place] (if you have MyChart) OR A paper copy in the mail If you have any lab test that is abnormal or we need to change your treatment, we will call you to review the results.  Testing/Procedures: None ordered.  Follow-Up: At [facility], you and your health needs are our priority.  As part of our continuing mission to provide you with exceptional heart care, we have created designated Provider Care Teams.  These Care Teams include your primary Cardiologist (physician) and Advanced Practice Providers (APPs -  Physician Assistants and Nurse Practitioners) who all work together to provide you with the care you need, when you need it.  We recommend signing up for the patient portal called "MyChart".  Sign up information is provided on this After Visit Summary.  MyChart is used to connect with patients for Virtual Visits (Telemedicine).  Patients are able to view lab/test results, encounter notes, upcoming appointments, etc.  Non-urgent messages can be sent to your provider as well.   To learn more about what you can do with MyChart, go to [email or web address].    Your next appointment:   1 year(s)  The format for your next appointment:   In Person  You will follow up with Mr. Legrand Como "Jonni Sanger" Chalmers Cater, PA-C in [place].    Important Information About Sugar

## 2022-06-30 NOTE — Progress Notes (Signed)
HPI [name] returns today for ongoing evaluation and management of atrial fibrillation.  The patient is an 88 year old woman with hypertension, persistent atrial fibrillation, and dyslipidemia. She has a long h/o atrial fib with variable control. She denies chest pain or sob. No syncope. She does admit to some non-compliance. Allergies  Allergen Reactions   Antihistamines, Diphenhydramine-Type Other (See Comments)    ALL ANTIHISTAMINES causes her heart to race   Oxycodone-Acetaminophen Nausea Only     Current Outpatient Medications  Medication Sig Dispense Refill   acetaminophen (TYLENOL) 500 MG tablet Take 1,000 mg by mouth every 6 (six) hours as needed for headache (pain). For pain     ALPRAZolam (XANAX) 0.25 MG tablet Take 1 tablet (0.25 mg total) by mouth daily as needed. for anxiety 30 tablet 1   amoxicillin (AMOXIL) 500 MG capsule TAKE 1 CAPSULE BY MOUTH EVERY DAY 90 capsule 3   apixaban (ELIQUIS) 5 MG TABS tablet Take 1 tablet (5 mg total) by mouth 2 (two) times daily. 180 tablet 3   atorvastatin (LIPITOR) 20 MG tablet Take 1 tablet (20 mg total) by mouth daily. 90 tablet 3   Calcium Carbonate-Vitamin D (CALCIUM-VITAMIN D) 500-200 MG-UNIT per tablet Take 1 tablet by mouth every other day.     carboxymethylcellul-glycerin (REFRESH OPTIVE) 0.5-0.9 % ophthalmic solution Place 1 drop into both eyes as needed for dry eyes.     Carboxymethylcellulose Sodium (THERATEARS OP) Place 1 drop into both eyes 2 (two) times daily as needed (dry eyes).     diltiazem (CARDIZEM CD) 120 MG 24 hr capsule Take 2 capsules (240 mg total) by mouth daily. 180 capsule 3   docusate sodium (COLACE) 100 MG capsule Take 1 capsule (100 mg total) by mouth 2 (two) times daily. 10 capsule 0   feeding supplement, GLUCERNA SHAKE, (GLUCERNA SHAKE) LIQD Take 237 mLs by mouth 2 (two) times daily between meals.  0   furosemide (LASIX) 40 MG tablet Take 1 tablet (40 mg total) by mouth at bedtime. 9pm 90 tablet 3    Hydrocortisone (GERHARDT'S BUTT CREAM) CREA Apply 1 application topically daily as needed for irritation.      metoprolol tartrate (LOPRESSOR) 50 MG tablet Take 1 tablet (50 mg total) by mouth 2 (two) times daily. 180 tablet 3   Multiple Vitamins-Minerals (MULTIVITAMIN WITH MINERALS) tablet Take 1 tablet by mouth daily at 6 PM.     Multiple Vitamins-Minerals (PRESERVISION AREDS 2) CAPS Take 1 capsule by mouth in the morning and at bedtime.     polyethylene glycol (MIRALAX / GLYCOLAX) 17 g packet Take 17 g by mouth daily. 14 each 0   Probiotic Product (DAILY PROBIOTIC) CAPS Take 1 capsule by mouth daily. 30 billion live culture with 12  probiotic strains     Triamcinolone Acetonide (TRIAMCINOLONE 0.1 % CREAM : EUCERIN) CREA Apply 1 application topically 2 (two) times daily as needed for rash, itching or irritation.     Zinc Oxide (TRIPLE PASTE) 12.8 % ointment Apply 1 application topically as needed for irritation.     No current facility-administered medications for this visit.     Past Medical History:  Diagnosis Date   A-fib [facility])    paroxysmal   Aftercare for healing traumatic fracture of arm, unspecified    H/O: hemorrhoidectomy    H/O: hysterectomy    Hyperlipidemia    Hypertension    Malignant melanoma of skin of trunk, except scrotum (HCC)    Other postprocedural status(V45.89)  Rosacea    S/P cholecystectomy    Sciatica    right   Sprain of lumbar region     ROS:   All systems reviewed and negative except as noted in the HPI.   Past Surgical History:  Procedure Laterality Date   ABDOMINAL HYSTERECTOMY     BLADDER SUSPENSION     never go surgery because it got canceled due to heart rate    cataract Bilateral 2019   catheter ablation of atrail flutter     CHOLECYSTECTOMY     HERNIA REPAIR       Family History  Problem Relation Age of Onset   Heart failure Mother    Heart failure Father    Liver cancer Daughter      Social History   Socioeconomic  History   Marital status: Widowed    Spouse name: Not on file   Number of children: 6   Years of education: 10   Highest education level: Not on file  Occupational History   Occupation: Primary school teacher    Employer: RETIRED    Comment: retired  Tobacco Use   Smoking status: Never   Smokeless tobacco: Never  Substance and Sexual Activity   Alcohol use: No   Drug use: No   Sexual activity: Never  Other Topics Concern   Not on file  Social History Narrative   10th grade. Married '51.4 sons- '52, '54, '58, '70; 2 daughters- '56, '64 older girl died of ovarian cancer.11 grandchildren, 2 great grands. Retired Primary school teacher. ACP - wishes full code status: CPR, mechanical ventilator and all heroic measures.    Social Determinants of Health   Financial Resource Strain: Not on file  Food Insecurity: Not on file  Transportation Needs: Not on file  Physical Activity: Not on file  Stress: Not on file  Social Connections: Not on file  Intimate Partner Violence: Not on file     Ht '5\' 4"'$  (1.626 m)   Wt 139 lb (63 kg)   BMI 23.86 kg/m   Physical Exam:  Well appearing NAD HEENT: Unremarkable Neck:  No JVD, no thyromegally Lymphatics:  No adenopathy Back:  No CVA tenderness Lungs:  Clear HEART:  Regular rate rhythm, no murmurs, no rubs, no clicks Abd:  soft, positive bowel sounds, no organomegally, no rebound, no guarding Ext:  2 plus pulses, no edema, no cyanosis, no clubbing Skin:  No rashes no nodules Neuro:  CN II through XII intact, motor grossly intact  EKG - atrial fib with a CVR  DEVICE  Normal device function.  See PaceArt for details.   Assess/Plan:  1. Atrial fib - her rate in the office is up a bit but is better at home. She will continue her beta blocker. 2. HTN - her bp is fairly well controled. She is encouraged to maintain a low sodium diet.  3. Coags - she has not bled. She will continue eliquis.  4. Diastolic heart failure -she appears to be  euvolemic. Continue lasix   Mikle [email or web address].D.

## 2022-07-21 DIAGNOSIS — Z961 Presence of intraocular lens: Secondary | ICD-10-CM | POA: Diagnosis not present

## 2022-07-21 DIAGNOSIS — H401132 Primary open-angle glaucoma, bilateral, moderate stage: Secondary | ICD-10-CM | POA: Diagnosis not present

## 2022-08-20 IMAGING — CT CT ABD-PELV W/ CM
2 of 5 series · 15 of 46 positions shown, 17 images · IV contrast (APPLIED)
Comparison: No prior abdominal imaging. Concurrent chest CT
reviewed.

CLINICAL DATA: Acute nonlocalized abdominal pain. Left leg swelling
and pain.

EXAM:
CT ABDOMEN AND PELVIS WITH CONTRAST
TECHNIQUE: Multidetector CT imaging of the abdomen and pelvis was performed
using the standard protocol following bolus administration of
intravenous contrast.
CONTRAST:  80mL OMNIPAQUE IOHEXOL 350 MG/ML SOLN

[Series 5: abdomen 5.0 · axial · 0.75mm/px · z∈[-530,-170]mm · 12 of 84 slices shown, 14 images]
[im 6/84  soft-tissue]
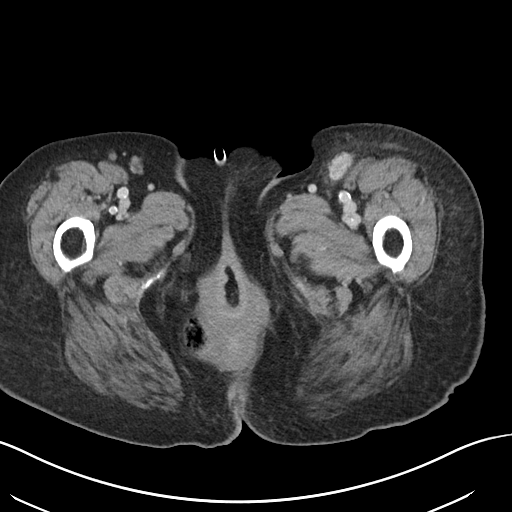
[im 6/84  bone]
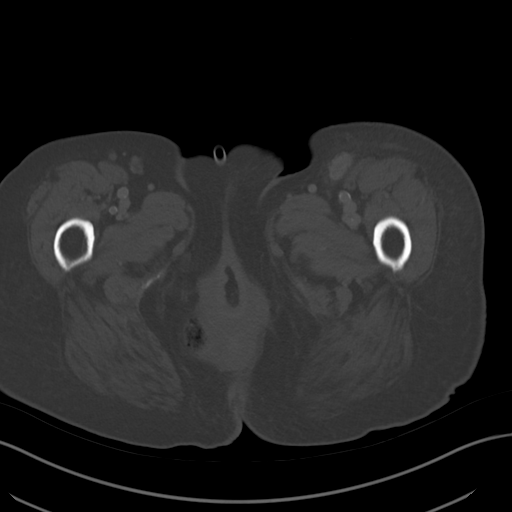
[im 12/84  soft-tissue]
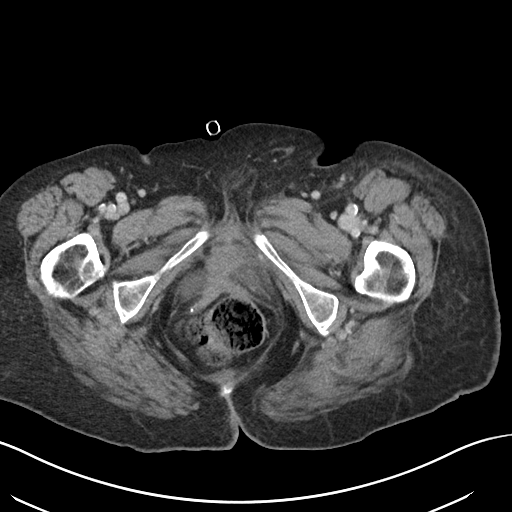
[im 17/84  soft-tissue]
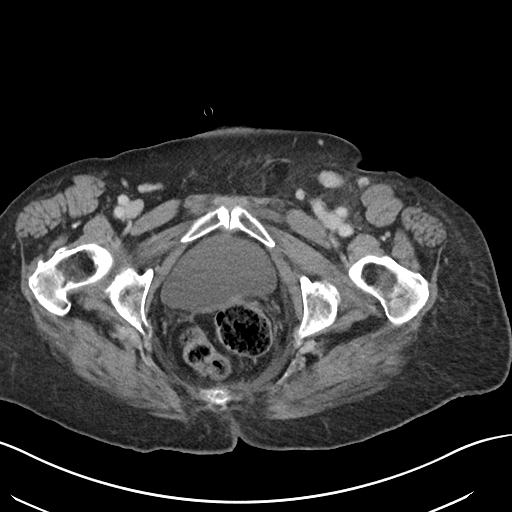
[im 28/84  soft-tissue]
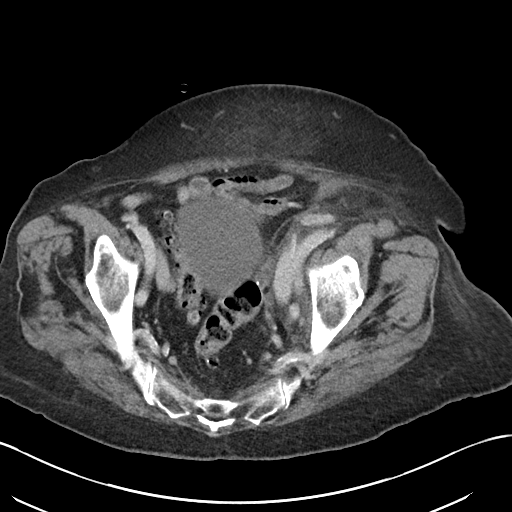
[im 34/84  soft-tissue]
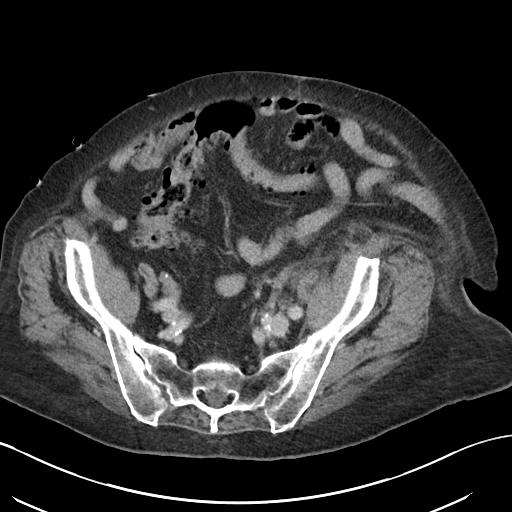
[im 39/84  soft-tissue]
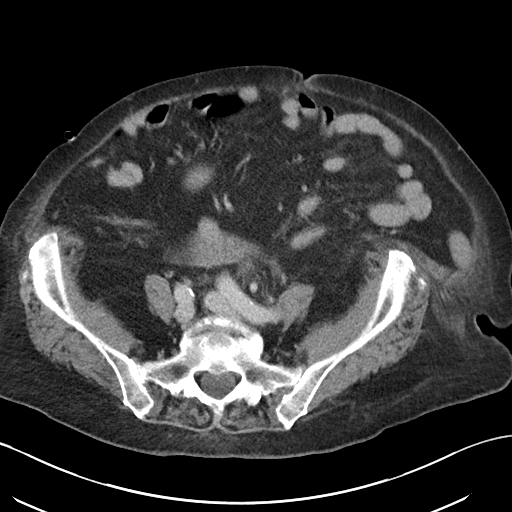
[im 45/84  soft-tissue]
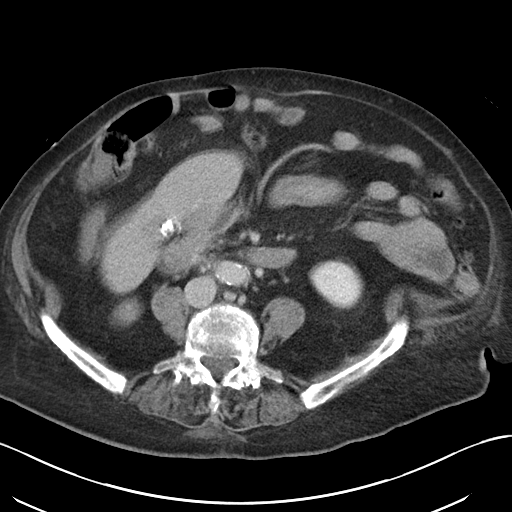
[im 50/84  soft-tissue]
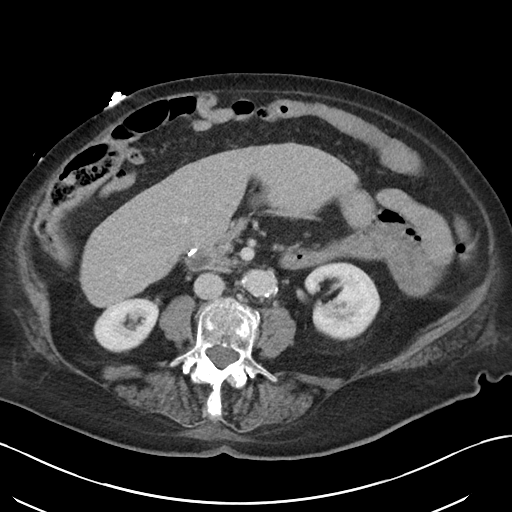
[im 56/84  soft-tissue]
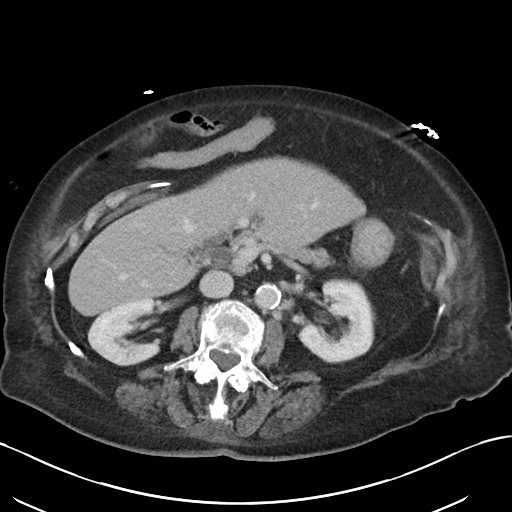
[im 56/84  bone]
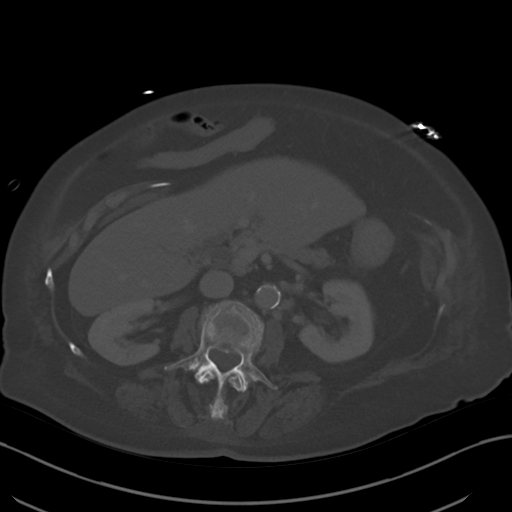
[im 67/84  soft-tissue]
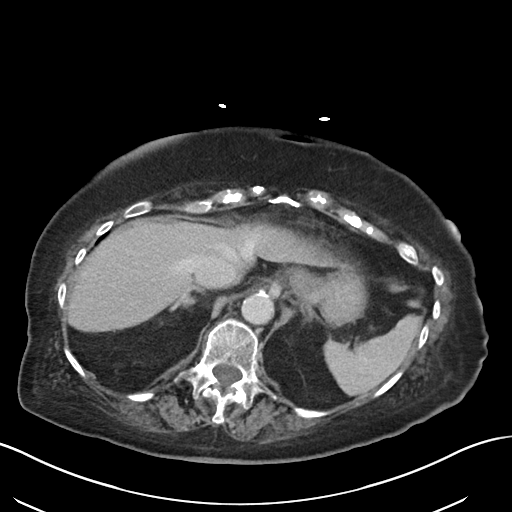
[im 72/84  soft-tissue]
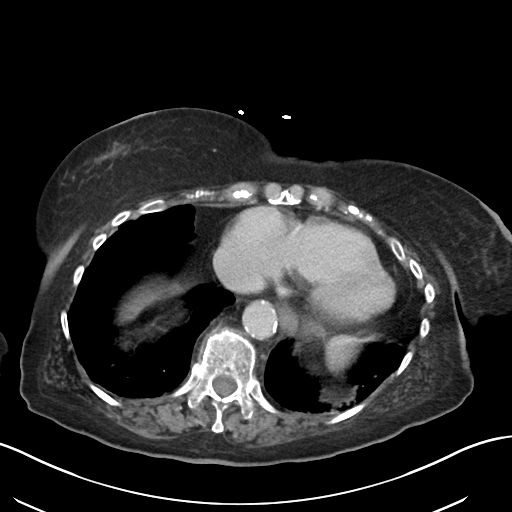
[im 78/84  soft-tissue]
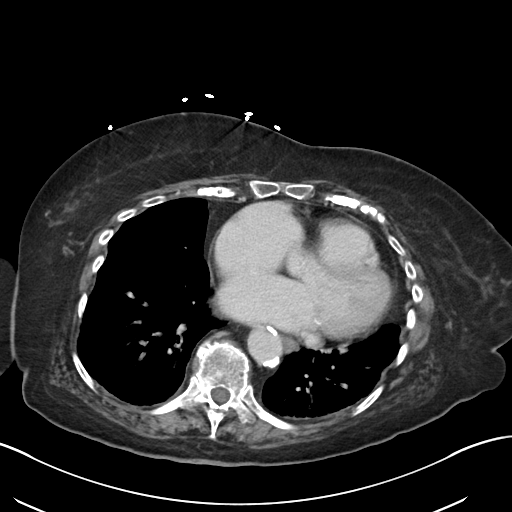

[Series 8: abdomen 3.0 mpr cor · coronal · 0.82mm/px · 3 of 102 slices shown]
[im 34/102  soft-tissue]
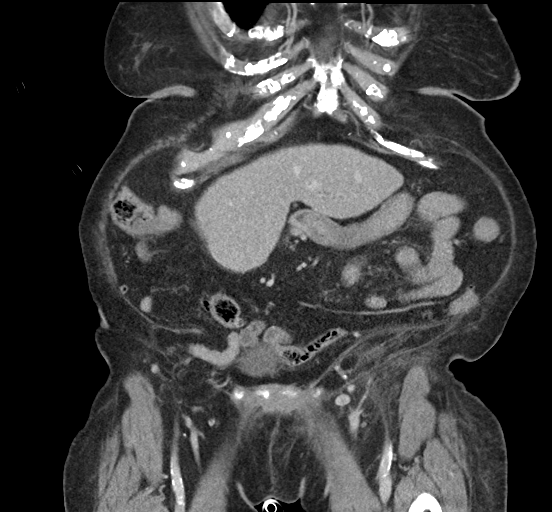
[im 45/102  soft-tissue]
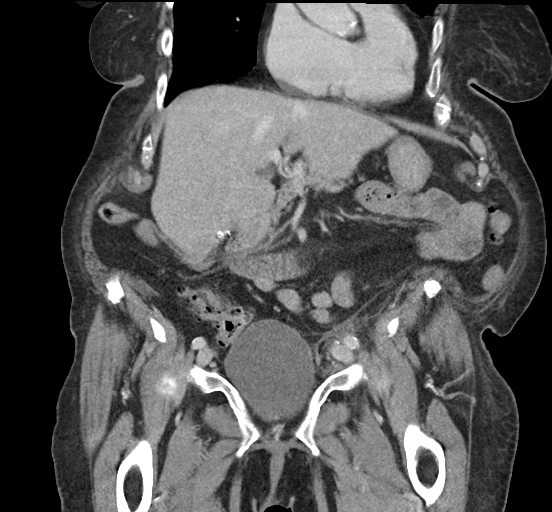
[im 57/102  soft-tissue]
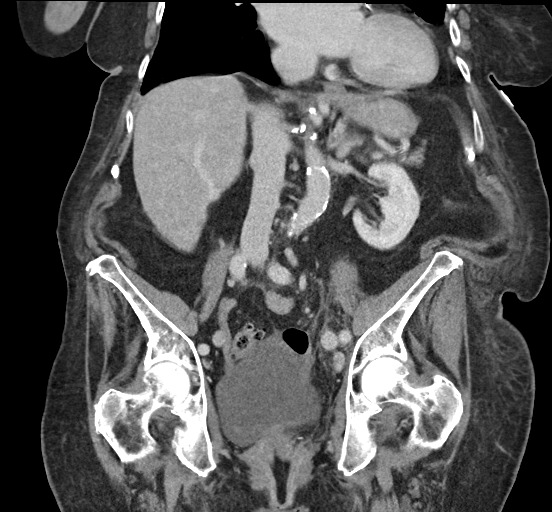

[15 of 46 positions shown; findings below may reference images not displayed]

FINDINGS: Lower chest: Assessed on concurrent chest CT. Cardiomegaly. Coronary
artery calcifications.

Hepatobiliary: Post cholecystectomy. Biliary prominence is likely
normal post cholecystectomy. Common bile duct measures 8 mm at the
porta hepatis. There is a coarse calcification involving the
anterior subcapsular liver, partially obscured by breathing motion
artifact. No underlying lesion is seen on delayed phase. No
suspicious focal hepatic lesion.

Pancreas: Parenchymal atrophy. No ductal dilatation or inflammation.

Spleen: Normal in size without focal abnormality.

Adrenals/Urinary Tract: No adrenal nodule. No hydronephrosis or
perinephric edema. Homogeneous renal enhancement with symmetric
excretion on delayed phase imaging. Possible nonobstructing stone in
the lower left kidney versus early excretion of IV contrast. Urinary
bladder is distended without wall thickening.

Stomach/Bowel: Nondistended stomach. There is no small bowel
obstruction or inflammatory change. High-riding cecum in the right
upper quadrant. Normal appendix. Interposition of the colon anterior
to the liver. Diffuse and multifocal colonic diverticulosis. Sigmoid
colon is tortuous. No evidence of diverticulitis. Stool distends the
rectum with possible rectocele.

Vascular/Lymphatic: There prominent mildly enlarged lymph nodes in
the left inguinal region measuring up to 14 mm. Prominent left
external iliac nodes measure up to 9 mm. There is fat stranding
adjacent to the external iliac and common femoral vessels. No
discrete intraluminal filling defect in the venous structures.
Arterial vascular calcifications without dissection, or acute
arterial abnormality. Additional prominent left retroperitoneal
nodes measure up to 8 mm the level of the iliac bifurcation. Aortic
atherosclerosis and tortuosity. No aortic aneurysm. Patent portal
vein.

Reproductive: Status post hysterectomy. No adnexal masses. Left
ovary tentatively visualized and quiescent. Right ovary not
definitively seen.

Other: No ascites or free fluid. No free air. No abdominopelvic
abscess. There is fat stranding involving the left anterior aspect
of the thigh which tracks along the iliac vessels into the
retroperitoneum. No soft tissue air.

Musculoskeletal: Mild to moderate compression fractures of L3 and L4
as well as mild L5 compression fracture likely chronic. Bones are
diffusely under mineralized. Diffuse multilevel degenerative change
in the spine. No evidence of left hip joint effusion or bony
destruction. Fat stranding involving the subcutaneous tissues of the
left inguinal region and anterior upper thigh without CT evidence of
muscular involvement.
IMPRESSION: 1. Fat stranding involving the left anterior aspect of the upper
thigh which tracks along the iliac vessels into the retroperitoneum.
No soft tissue air, focal fluid collection, or deep muscle
involvement. There are also prominent left inguinal and external
iliac nodes. Suspect left lower extremity cellulitis with reactive
adenopathy, however clinical correlation is needed.
2. No lower extremity DVT or acute vascular findings.
3. Colonic diverticulosis without diverticulitis. Stool distends the
rectum with possible rectocele.
4. Compression fractures of L3, L4, and L5 are likely chronic.

Aortic Atherosclerosis (UI2T0-055.5).

## 2022-08-22 IMAGING — DX DG CHEST 1V PORT
1 series · 1 of 1 positions shown · non-contrast
Comparison: 07/25/2020.

CLINICAL DATA: Shortness of breath.

EXAM:
PORTABLE CHEST 1 VIEW

[chest ap]
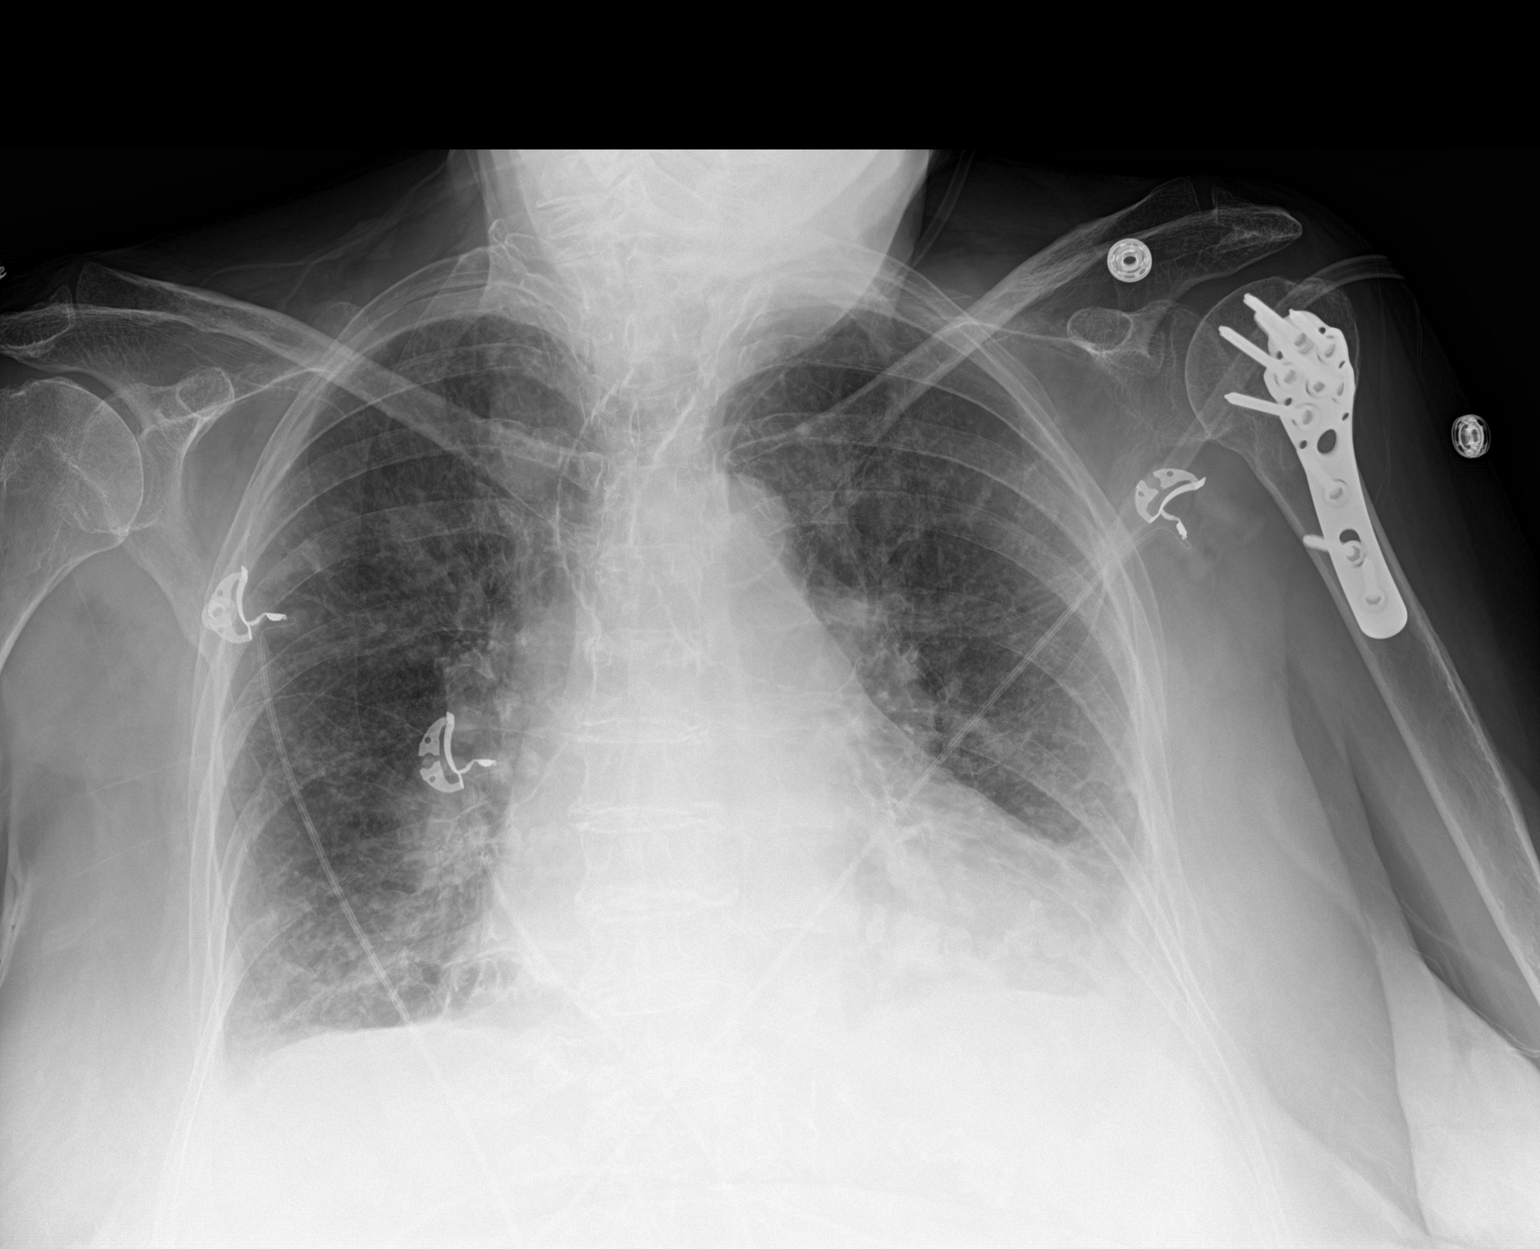

[1 of 1 positions shown; findings below may reference images not displayed]

FINDINGS: Cardiomegaly with increased bilateral interstitial prominence.
Bilateral pleural effusions. Findings consistent with CHF.
Pneumonitis cannot be excluded. Postsurgical changes left humerus.
IMPRESSION: Cardiomegaly with increased bilateral interstitial prominence and
bilateral pleural effusions consistent with CHF. Pneumonitis cannot
be excluded.

## 2022-09-04 DIAGNOSIS — H348121 Central retinal vein occlusion, left eye, with retinal neovascularization: Secondary | ICD-10-CM | POA: Diagnosis not present

## 2022-09-04 DIAGNOSIS — H524 Presbyopia: Secondary | ICD-10-CM | POA: Diagnosis not present

## 2022-09-04 DIAGNOSIS — H401132 Primary open-angle glaucoma, bilateral, moderate stage: Secondary | ICD-10-CM | POA: Diagnosis not present

## 2022-10-23 ENCOUNTER — Other Ambulatory Visit: Payer: Self-pay

## 2022-10-23 ENCOUNTER — Encounter: Payer: Self-pay | Admitting: Internal Medicine

## 2022-10-23 ENCOUNTER — Ambulatory Visit (INDEPENDENT_AMBULATORY_CARE_PROVIDER_SITE_OTHER): Payer: Medicare Other | Admitting: Internal Medicine

## 2022-10-23 VITALS — BP 133/80 | HR 83 | Temp 98.1°F | Ht 65.0 in | Wt 142.0 lb

## 2022-10-23 DIAGNOSIS — L03119 Cellulitis of unspecified part of limb: Secondary | ICD-10-CM

## 2022-10-23 DIAGNOSIS — Z23 Encounter for immunization: Secondary | ICD-10-CM

## 2022-10-23 MED ORDER — AMOXICILLIN 500 MG PO CAPS
ORAL_CAPSULE | ORAL | 3 refills | Status: DC
Start: 2022-10-23 — End: 2023-07-16

## 2022-10-23 NOTE — Progress Notes (Signed)
[facility]  CHIEF COMPLAINT:    Follow up for cellulitis  SUBJECTIVE:    [name] [name] is a 88 y.o. female with PMHx as below who presents to the clinic for cellulitis.   Patient here today for routine follow up.  She was seen 12 months ago and is currently on amoxicillin '500mg'$  daily for suppression of recurrent cellulitis.  She is tolerating well with no side effects and no further episodes since her last visit in November 2022.   Please see A&P for the details of today's visit and status of the patient's medical problems.   Patient's Medications  New Prescriptions   No medications on file  Previous Medications   ACETAMINOPHEN (TYLENOL) 500 MG TABLET    Take 1,000 mg by mouth every 6 (six) hours as needed for headache (pain). For pain   ALPRAZOLAM (XANAX) 0.25 MG TABLET    Take 1 tablet (0.25 mg total) by mouth daily as needed. for anxiety   APIXABAN (ELIQUIS) 5 MG TABS TABLET    Take 1 tablet (5 mg total) by mouth 2 (two) times daily.   ATORVASTATIN (LIPITOR) 20 MG TABLET    Take 1 tablet (20 mg total) by mouth daily.   CALCIUM CARBONATE-VITAMIN D (CALCIUM-VITAMIN D) 500-200 MG-UNIT PER TABLET    Take 1 tablet by mouth every other day.   CARBOXYMETHYLCELLUL-GLYCERIN (REFRESH OPTIVE) 0.5-0.9 % OPHTHALMIC SOLUTION    Place 1 drop into both eyes as needed for dry eyes.   CARBOXYMETHYLCELLULOSE SODIUM (THERATEARS OP)    Place 1 drop into both eyes 2 (two) times daily as needed (dry eyes).   DILTIAZEM (CARDIZEM CD) 120 MG 24 HR CAPSULE    Take 2 capsules (240 mg total) by mouth daily.   DOCUSATE SODIUM (COLACE) 100 MG CAPSULE    Take 1 capsule (100 mg total) by mouth 2 (two) times daily.   FEEDING SUPPLEMENT, GLUCERNA SHAKE, (GLUCERNA SHAKE) LIQD    Take 237 mLs by mouth 2 (two) times daily between meals.   FUROSEMIDE (LASIX) 40 MG TABLET    Take 1 tablet (40 mg total) by mouth at bedtime. 9pm   HYDROCORTISONE (GERHARDT'S BUTT CREAM) CREA    Apply 1  application topically daily as needed for irritation.    LATANOPROST (XALATAN) 0.005 % OPHTHALMIC SOLUTION    SMARTSIG:1 Drop(s) In Eye(s) Every Evening   METOPROLOL TARTRATE (LOPRESSOR) 50 MG TABLET    Take 1 tablet (50 mg total) by mouth 2 (two) times daily.   MULTIPLE VITAMINS-MINERALS (MULTIVITAMIN WITH MINERALS) TABLET    Take 1 tablet by mouth daily at 6 PM.   MULTIPLE VITAMINS-MINERALS (PRESERVISION AREDS 2) CAPS    Take 1 capsule by mouth in the morning and at bedtime.   POLYETHYLENE GLYCOL (MIRALAX / GLYCOLAX) 17 G PACKET    Take 17 g by mouth daily.   PROBIOTIC PRODUCT (DAILY PROBIOTIC) CAPS    Take 1 capsule by mouth daily. 30 billion live culture with 12  probiotic strains   TRIAMCINOLONE ACETONIDE (TRIAMCINOLONE 0.1 % CREAM : EUCERIN) CREA    Apply 1 application topically 2 (two) times daily as needed for rash, itching or irritation.   ZINC OXIDE (TRIPLE PASTE) 12.8 % OINTMENT    Apply 1 application topically as needed for irritation.  Modified Medications   Modified Medication Previous Medication   AMOXICILLIN (AMOXIL) 500 MG CAPSULE amoxicillin (AMOXIL) 500 MG capsule      TAKE 1 CAPSULE  BY MOUTH EVERY DAY    TAKE 1 CAPSULE BY MOUTH EVERY DAY  Discontinued Medications   No medications on file      Past Medical History:  Diagnosis Date   A-fib ([place])    paroxysmal   Aftercare for healing traumatic fracture of arm, unspecified    H/O: hemorrhoidectomy    H/O: hysterectomy    Hyperlipidemia    Hypertension    Malignant melanoma of skin of trunk, except scrotum (HCC)    Other postprocedural status(V45.89)    Rosacea    S/P cholecystectomy    Sciatica    right   Sprain of lumbar region     Social History   Tobacco Use   Smoking status: Never   Smokeless tobacco: Never  Substance Use Topics   Alcohol use: No   Drug use: No    Family History  Problem Relation Age of Onset   Heart failure Mother    Heart failure Father    Liver cancer Daughter     Allergies   Allergen Reactions   Antihistamines, Diphenhydramine-Type Other (See Comments)    ALL ANTIHISTAMINES causes her heart to race   Oxycodone-Acetaminophen Nausea Only    Review of Systems  Constitutional: Negative.   Skin: Negative.      OBJECTIVE:    Vitals:   10/23/22 1421  BP: 133/80  Pulse: 83  Temp: 98.1 F (36.7 C)  TempSrc: Oral  SpO2: 97%  Weight: 142 lb (64.4 kg)  Height: '5\' 5"'$  (1.651 m)   Body mass index is 23.63 kg/m.  Physical Exam Constitutional:      Appearance: Normal appearance.  HENT:     Head: Normocephalic and atraumatic.  Musculoskeletal:     Right lower leg: Edema present.     Left lower leg: Edema present.  Skin:    Findings: No erythema or rash.  Neurological:     General: No focal deficit present.     Mental Status: She is alert. Mental status is at baseline.      Labs and Microbiology:    Latest Ref Rng & Units 05/15/2022    3:04 PM 10/17/2021    2:20 PM 04/19/2021   11:53 AM  CBC  WBC 4.0 - 10.5 K/uL 5.9  7.6  6.1   Hemoglobin 12.0 - 15.0 g/dL 13.6  13.6  14.3   Hematocrit 36.0 - 46.0 % 41.4  41.5  42.6   Platelets 150.0 - 400.0 K/uL 231.0  264  229       Latest Ref Rng & Units 05/15/2022    3:04 PM 10/17/2021    2:20 PM 04/19/2021   11:53 AM  CMP  Glucose 70 - 99 mg/dL 148  113  139   BUN 6 - 23 mg/dL '23  19  17   '$ Creatinine 0.40 - 1.20 mg/dL 0.70  0.74  0.79   Sodium 135 - 145 mEq/L 143  144  143   Potassium 3.5 - 5.1 mEq/L 3.8  4.6  4.6   Chloride 96 - 112 mEq/L 105  107  104   CO2 19 - 32 mEq/L '31  28  24   '$ Calcium 8.4 - 10.5 mg/dL 10.7  10.1  10.5   Total Protein 6.0 - 8.3 g/dL 7.6   7.3   Total Bilirubin 0.2 - 1.2 mg/dL 1.7   1.9   Alkaline Phos 39 - 117 U/L 104   102   AST 0 - 37 U/L 31   27  ALT 0 - 35 U/L 27   26        ASSESSMENT & PLAN:    Recurrent cellulitis of lower extremity [name] [name] appears to be doing well currently on amoxicillin '500mg'$  PO daily for prevention/prophylaxis of cellulitis.  Will  continue this daily and send in refills today.  RTC in 1 year, sooner if needed.  If she were to develop a flareup, would recommend amoxicillin 3 times daily for a period of 5 to 7 days.  She had labs checked in June so will defer doing so again today as these were stable upon review.     Raynelle High[facility] 10/23/2022, 2:52 PM

## 2022-10-23 NOTE — Assessment & Plan Note (Signed)
[name] [name] appears to be doing well currently on amoxicillin '500mg'$  PO daily for prevention/prophylaxis of cellulitis.  Will continue this daily and send in refills today.  RTC in 1 year, sooner if needed.  If she were to develop a flareup, would recommend amoxicillin 3 times daily for a period of 5 to 7 days.  She had labs checked in June so will defer doing so again today as these were stable upon review.

## 2023-02-12 LAB — HM MAMMOGRAPHY

## 2023-02-20 ENCOUNTER — Encounter: Payer: Self-pay | Admitting: Internal Medicine

## 2023-04-19 ENCOUNTER — Telehealth: Payer: Self-pay

## 2023-04-19 NOTE — Transitions of Care (Post Inpatient/ED Visit) (Signed)
   04/26/2023  Name: [name] [name] [facility] MRN: [id number] DOB: 03-14-1931  Today's TOC FU Call Status: Today's TOC FU Call Status:: Unsuccessful Call (3rd Attempt) Unsuccessful Call (3rd Attempt) Date: 04/26/23 [facility] FU Call Complete Date: 04/26/23  Attempted to reach the patient regarding the most recent Inpatient/ED visit.  Follow Up Plan: No further outreach attempts will be made at this time. We have been unable to contact the patient.  Signature  Fredirick Maudlin

## 2023-05-10 ENCOUNTER — Encounter: Payer: Self-pay | Admitting: Internal Medicine

## 2023-05-12 ENCOUNTER — Other Ambulatory Visit: Payer: Self-pay | Admitting: Internal Medicine

## 2023-05-24 ENCOUNTER — Encounter: Payer: Self-pay | Admitting: Internal Medicine

## 2023-05-24 ENCOUNTER — Telehealth (INDEPENDENT_AMBULATORY_CARE_PROVIDER_SITE_OTHER): Payer: Medicare Other | Admitting: Internal Medicine

## 2023-05-24 VITALS — Wt 145.0 lb

## 2023-05-24 DIAGNOSIS — R7303 Prediabetes: Secondary | ICD-10-CM

## 2023-05-24 DIAGNOSIS — I700 Atherosclerosis of aorta: Secondary | ICD-10-CM

## 2023-05-24 DIAGNOSIS — I4821 Permanent atrial fibrillation: Secondary | ICD-10-CM

## 2023-05-24 DIAGNOSIS — I5032 Chronic diastolic (congestive) heart failure: Secondary | ICD-10-CM

## 2023-05-24 DIAGNOSIS — E782 Mixed hyperlipidemia: Secondary | ICD-10-CM | POA: Diagnosis not present

## 2023-05-24 DIAGNOSIS — F419 Anxiety disorder, unspecified: Secondary | ICD-10-CM

## 2023-05-24 DIAGNOSIS — R911 Solitary pulmonary nodule: Secondary | ICD-10-CM

## 2023-05-24 DIAGNOSIS — I10 Essential (primary) hypertension: Secondary | ICD-10-CM

## 2023-05-24 MED ORDER — ALPRAZOLAM 0.25 MG PO TABS: 0.2500 mg | ORAL_TABLET | Freq: Every day | ORAL | 1 refills | Status: AC | PRN

## 2023-05-24 MED ORDER — METOPROLOL TARTRATE 50 MG PO TABS: 50.0000 mg | ORAL_TABLET | Freq: Two times a day (BID) | ORAL | 3 refills | Status: DC

## 2023-05-24 MED ORDER — APIXABAN 5 MG PO TABS: 5.0000 mg | ORAL_TABLET | Freq: Two times a day (BID) | ORAL | 3 refills | Status: DC

## 2023-05-24 MED ORDER — FUROSEMIDE 40 MG PO TABS: 40.0000 mg | ORAL_TABLET | Freq: Every day | ORAL | 3 refills | Status: DC

## 2023-05-24 MED ORDER — ATORVASTATIN CALCIUM 20 MG PO TABS: 20.0000 mg | ORAL_TABLET | Freq: Every day | ORAL | 3 refills | Status: DC

## 2023-05-24 MED ORDER — DILTIAZEM HCL ER COATED BEADS 120 MG PO CP24: 240.0000 mg | ORAL_CAPSULE | Freq: Every day | ORAL | 3 refills | Status: DC

## 2023-05-24 NOTE — Assessment & Plan Note (Signed)
Uses rare alprazolam and refilled #30 1 refill. She understands risk and there is ongoing benefit.

## 2023-05-24 NOTE — Assessment & Plan Note (Signed)
Doing well on eliquis 5 mg BID and diltiazem 240 mg daily and metoprolol 50 mg BID for rate control. Continue same.

## 2023-05-24 NOTE — Assessment & Plan Note (Signed)
BP at goal in hospital on her lasix 40 mg daily and metoprolol 50 mg BID and diltiazem 240 mg daily and labs stable. Will continue and refilled.

## 2023-05-24 NOTE — Assessment & Plan Note (Signed)
No flare today. Using lasix 40 mg daily. Recent BMP stable without indication for change. No chest pains.

## 2023-05-24 NOTE — Assessment & Plan Note (Signed)
Taking lipitor and will continue. Given that she is doing virtual visit we will not check lipid panel this year and plan for next in person visit.

## 2023-05-24 NOTE — Assessment & Plan Note (Signed)
Continue lipitor 20 mg daily and will defer lipid panel this year and recheck next year.

## 2023-05-24 NOTE — Progress Notes (Signed)
Virtual Visit via Video Note  I connected with [facility] on 05/24/23 at  9:00 AM EDT by a video enabled telemedicine application and verified that I am speaking with the correct person using two identifiers.  The patient and the provider were at separate locations throughout the entire encounter. Patient location: home, Provider location: work   I discussed the limitations of evaluation and management by telemedicine and the availability of in person appointments. The patient expressed understanding and agreed to proceed. The patient and the provider were the only parties present for the visit unless noted in HPI below.  History of Present Illness: The patient is a 89 y.o. female with visit for yearly follow up. Hospital admission for CAP since last but fully recovered.   Observations/Objective: Appearance: normal, breathing appears normal, casual grooming, abdomen does not appear distended, mental status is awake and oriented  Assessment and Plan: See problem oriented charting  Follow Up Instructions: f/u 1 year meds refilled  I discussed the assessment and treatment plan with the patient. The patient was provided an opportunity to ask questions and all were answered. The patient agreed with the plan and demonstrated an understanding of the instructions.   The patient was advised to call back or seek an in-person evaluation if the symptoms worsen or if the condition fails to improve as anticipated.  Myrlene Broker, MD

## 2023-05-24 NOTE — Assessment & Plan Note (Signed)
Will defer labs today and continue monitoring with next lab draw HgA1c.

## 2023-05-24 NOTE — Assessment & Plan Note (Signed)
Discussed there was some change on recent admission we do not want to pursue workup at this time. No clinical symptoms.

## 2023-07-02 NOTE — Progress Notes (Unsigned)
  Electrophysiology Office Note:   Date:  07/03/2023  ID:  [name] Cluck [facility], DOB 1931/01/21, MRN [id number]  Primary Cardiologist: None Electrophysiologist: Lewayne Bunting, MD      History of Present Illness:   [name] [name] is a 89 y.o. female with h/o persistent atrial fibrillation, HTN, and HLD seen today for routine electrophysiology followup.   Since last being seen in our clinic the patient reports doing overall well from a cardiac perspective. She has a rare "tingly, sharp" pain that goes across her chest, not related to exertion.  He has trouble sleeping but sometimes takes her lasix at night.   She is mildly SOB up stairs on an incline, but gets better if she rests or takes her time.   Review of systems complete and found to be negative unless listed in HPI.   EP Information / Studies Reviewed:    EKG is ordered today. Personal review as below.  EKG Interpretation Date/Time:  Tuesday July 03 2023 10:27:29 EDT Ventricular Rate:  78 PR Interval:    QRS Duration:  96 QT Interval:  400 QTC Calculation: 456 R Axis:   105  Text Interpretation: Atrial fibrillation Rightward axis When compared with ECG of 30-Jul-2020 19:48, No significant change was found Confirmed by [name] [phone]) on 07/03/2023 10:36:29 AM      Physical Exam:   VS:  BP 122/60   Pulse 78   Ht 5\' 5"  (1.651 m)   Wt 145 lb (65.8 kg)   SpO2 96%   BMI 24.13 kg/m    Wt Readings from Last 3 Encounters:  07/03/23 145 lb (65.8 kg)  05/24/23 145 lb (65.8 kg)  10/23/22 142 lb (64.4 kg)     GEN: Well nourished, well developed in no acute distress NECK: No JVD; No carotid bruits CARDIAC: Irregularly irregular rate and rhythm, no murmurs, rubs, gallops RESPIRATORY:  Clear to auscultation without rales, wheezing or rhonchi  ABDOMEN: Soft, non-tender, non-distended EXTREMITIES:  No edema; No deformity   ASSESSMENT AND PLAN:    Permanent atrial fibrillation Rates well controlled Continue Eliquis for  CHA2DS2/VASc of at least 6.    HTN Stable on current regimen   Chronic diastolic failure Volume status stable on exam.  Encouraged to take lasix in the morning or afternoon so it doesn't affect her sleep.   Mood/Anxiety After loss of both of her sons.  Pt feels like she deals with anxiety OK, encouraged PCP f/u.   Follow up with Dr. Ladona Ridgel in 12 months  Signed, Graciella Freer, PA-C

## 2023-07-03 ENCOUNTER — Ambulatory Visit: Payer: Medicare Other | Attending: Student | Admitting: Student

## 2023-07-03 ENCOUNTER — Encounter: Payer: Self-pay | Admitting: Student

## 2023-07-03 VITALS — BP 122/60 | HR 78 | Ht 65.0 in | Wt 145.0 lb

## 2023-07-03 DIAGNOSIS — I10 Essential (primary) hypertension: Secondary | ICD-10-CM | POA: Diagnosis not present

## 2023-07-03 DIAGNOSIS — I4821 Permanent atrial fibrillation: Secondary | ICD-10-CM | POA: Diagnosis not present

## 2023-07-03 DIAGNOSIS — I5032 Chronic diastolic (congestive) heart failure: Secondary | ICD-10-CM

## 2023-07-03 NOTE — Patient Instructions (Signed)
Medication Instructions:  Your physician recommends that you continue on your current medications as directed. Please refer to the Current Medication list given to you today.  *If you need a refill on your cardiac medications before your next appointment, please call your pharmacy*  Lab Work: None ordered If you have labs (blood work) drawn today and your tests are completely normal, you will receive your results only by: MyChart Message (if you have MyChart) OR A paper copy in the mail If you have any lab test that is abnormal or we need to change your treatment, we will call you to review the results.  Follow-Up: At [facility], you and your health needs are our priority.  As part of our continuing mission to provide you with exceptional heart care, we have created designated Provider Care Teams.  These Care Teams include your primary Cardiologist (physician) and Advanced Practice Providers (APPs -  Physician Assistants and Nurse Practitioners) who all work together to provide you with the care you need, when you need it.  Your next appointment:   1 year(s)  Provider:   Gregg Taylor, MD  

## 2023-07-16 ENCOUNTER — Other Ambulatory Visit: Payer: Self-pay

## 2023-07-16 MED ORDER — AMOXICILLIN 500 MG PO CAPS: ORAL_CAPSULE | ORAL | 0 refills | Status: DC

## 2023-09-24 ENCOUNTER — Other Ambulatory Visit: Payer: Self-pay

## 2023-09-24 ENCOUNTER — Encounter: Payer: Self-pay | Admitting: Infectious Disease

## 2023-09-24 ENCOUNTER — Encounter: Payer: Self-pay | Admitting: Podiatry

## 2023-09-24 ENCOUNTER — Ambulatory Visit (INDEPENDENT_AMBULATORY_CARE_PROVIDER_SITE_OTHER): Payer: Medicare Other | Admitting: Podiatry

## 2023-09-24 ENCOUNTER — Ambulatory Visit: Payer: Medicare Other | Admitting: Infectious Disease

## 2023-09-24 VITALS — BP 106/63 | HR 89 | Resp 16 | Ht 65.0 in | Wt 143.8 lb

## 2023-09-24 DIAGNOSIS — B351 Tinea unguium: Secondary | ICD-10-CM | POA: Diagnosis not present

## 2023-09-24 DIAGNOSIS — M79675 Pain in left toe(s): Secondary | ICD-10-CM

## 2023-09-24 DIAGNOSIS — M79674 Pain in right toe(s): Secondary | ICD-10-CM | POA: Diagnosis not present

## 2023-09-24 DIAGNOSIS — Z23 Encounter for immunization: Secondary | ICD-10-CM

## 2023-09-24 DIAGNOSIS — Z7185 Encounter for immunization safety counseling: Secondary | ICD-10-CM | POA: Diagnosis not present

## 2023-09-24 DIAGNOSIS — L0390 Cellulitis, unspecified: Secondary | ICD-10-CM | POA: Insufficient documentation

## 2023-09-24 HISTORY — DX: Cellulitis, unspecified: L03.90

## 2023-09-24 MED ORDER — AMOXICILLIN 500 MG PO CAPS: ORAL_CAPSULE | ORAL | 3 refills | Status: DC

## 2023-09-24 NOTE — Progress Notes (Signed)
This patient returns to my office for at risk foot care.  This patient requires this care by a professional since this patient will be at risk due to having coagulation defect due to eliquis.  This patient is unable to cut nails herself since the patient cannot reach her nails.These nails are painful walking and wearing shoes.  This patient presents for at risk foot care today.  General Appearance  Alert, conversant and in no acute stress.  Vascular  Dorsalis pedis and posterior tibial  pulses are absent   bilaterally.  Capillary return is within normal limits  bilaterally. Temperature is within normal limits  bilaterally.  Neurologic  Senn-Weinstein monofilament wire test within normal limits  bilaterally. Muscle power within normal limits bilaterally.  Nails Thick disfigured discolored nails with subungual debris  from hallux to fifth toes bilaterally. No evidence of bacterial infection or drainage bilaterally.  Orthopedic  No limitations of motion  feet .  No crepitus or effusions noted.  No bony pathology or digital deformities noted.  Skin  normotropic skin with no porokeratosis noted bilaterally.  No signs of infections or ulcers noted.     Onychomycosis  Pain in right toes  Pain in left toes  Consent was obtained for treatment procedures.   Mechanical debridement of nails 1-5  bilaterally performed with a nail nipper.  Filed with dremel without incident.    Return office visit   3 months                   Told patient to return for periodic foot care and evaluation due to potential at risk complications.   Helane Gunther DPM

## 2023-09-24 NOTE — Progress Notes (Signed)
Subjective:   Chief complaint: Follow-up for recurrent cellulitis   Patient ID: [name] [name] [facility], female    DOB: 09-05-1931, 89 y.o.   MRN: [id number]  HPI  [name] [name] is an 89 year old woman who does have lower extremity edema and venous stasis dermatitis who had been hospitalized in 2021 with cellulitis and has been on prophylactic amoxicillin.  She has not had recurrences since having been on prophylactic amoxicillin she did have 1 episode of fevers this past May for which she was seen in the ER [facility] and diagnosed with community-acquired pneumonia at the time.    Past Medical History:  Diagnosis Date   A-fib [facility])    paroxysmal   Aftercare for healing traumatic fracture of arm, unspecified    H/O: hemorrhoidectomy    H/O: hysterectomy    Hyperlipidemia    Hypertension    Malignant melanoma of skin of trunk, except scrotum (HCC)    Other postprocedural status(V45.89)    Recurrent cellulitis 09/24/2023   Rosacea    S/P cholecystectomy    Sciatica    right   Sprain of lumbar region     Past Surgical History:  Procedure Laterality Date   ABDOMINAL HYSTERECTOMY     BLADDER SUSPENSION     never go surgery because it got canceled due to heart rate    cataract Bilateral 2019   catheter ablation of atrail flutter     CHOLECYSTECTOMY     HERNIA REPAIR      Family History  Problem Relation Age of Onset   Heart failure Mother    Heart failure Father    Liver cancer Daughter       Social History   Socioeconomic History   Marital status: Widowed    Spouse name: Not on file   Number of children: 6   Years of education: 10   Highest education level: Not on file  Occupational History   Occupation: Public librarian    Employer: RETIRED    Comment: retired  Tobacco Use   Smoking status: Never   Smokeless tobacco: Never  Substance and Sexual Activity   Alcohol use: No   Drug use: No   Sexual activity: Never  Other Topics Concern   Not on  file  Social History Narrative   10th grade. Married '51.4 sons- '52, '54, '58, '70; 2 daughters- '56, '64 older girl died of ovarian cancer.11 grandchildren, 2 great grands. 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    Social Determinants of Health   Financial Resource Strain: Low Risk  (04/15/2023)   Received from [facility], [facility]   Overall Financial Resource Strain (CARDIA)    Difficulty of Paying Living Expenses: Not hard at all  Food Insecurity: No Food Insecurity (04/15/2023)   Received from [facility], [facility]   Hunger Vital Sign    Worried About Running Out of Food in the Last Year: Never true    Ran Out of Food in the Last Year: Never true  Transportation Needs: No Transportation Needs (04/15/2023)   Received from [facility], [facility]   [facility] - Transportation    In the past 12 months, has lack of transportation kept you from medical appointments or from getting medications?: No    Lack of Transportation (Non-Medical): No  Physical Activity: Not on file  Stress: Not on  file  Social Connections: Unknown (04/11/2022)   Received from [facility], [facility]   Social Network    Social Network: Not on file    Allergies  Allergen Reactions   Antihistamines, Diphenhydramine-Type Other (See Comments)    ALL ANTIHISTAMINES causes her heart to race   Oxycodone-Acetaminophen Nausea Only     Current Outpatient Medications:    acetaminophen (TYLENOL) 500 MG tablet, Take 1,000 mg by mouth every 6 (six) hours as needed for headache (pain). For pain, Disp: , Rfl:    ALPRAZolam (XANAX) 0.25 MG tablet, Take 1 tablet (0.25 mg total) by mouth daily as needed. for anxiety, Disp: 30 tablet, Rfl: 1   apixaban (ELIQUIS) 5 MG TABS tablet, Take 1 tablet (5 mg total) by mouth 2 (two) times daily., Disp:  180 tablet, Rfl: 3   atorvastatin (LIPITOR) 20 MG tablet, Take 1 tablet (20 mg total) by mouth daily., Disp: 90 tablet, Rfl: 3   Calcium Carbonate-Vitamin D (CALCIUM-VITAMIN D) 500-200 MG-UNIT per tablet, Take 1 tablet by mouth every other day., Disp: , Rfl:    Carboxymethylcellulose Sodium (THERATEARS OP), Place 1 drop into both eyes 2 (two) times daily as needed (dry eyes)., Disp: , Rfl:    diltiazem (CARDIZEM CD) 120 MG 24 hr capsule, Take 2 capsules (240 mg total) by mouth daily., Disp: 180 capsule, Rfl: 3   docusate sodium (COLACE) 100 MG capsule, Take 1 capsule (100 mg total) by mouth 2 (two) times daily. (Patient taking differently: Take 100 mg by mouth as needed.), Disp: 10 capsule, Rfl: 0   feeding supplement, GLUCERNA SHAKE, (GLUCERNA SHAKE) LIQD, Take 237 mLs by mouth 2 (two) times daily between meals., Disp: , Rfl: 0   ferrous sulfate 325 (65 FE) MG EC tablet, Take 325 mg by mouth daily., Disp: , Rfl:    furosemide (LASIX) 40 MG tablet, Take 1 tablet (40 mg total) by mouth at bedtime. 9pm, Disp: 90 tablet, Rfl: 3   Hydrocortisone (GERHARDT'S BUTT CREAM) CREA, Apply 1 application topically daily as needed for irritation. , Disp: , Rfl:    latanoprost (XALATAN) 0.005 % ophthalmic solution, SMARTSIG:1 Drop(s) In Eye(s) Every Evening, Disp: , Rfl:    metoprolol tartrate (LOPRESSOR) 50 MG tablet, Take 1 tablet (50 mg total) by mouth 2 (two) times daily., Disp: 180 tablet, Rfl: 3   Multiple Vitamins-Minerals (MULTIVITAMIN WITH MINERALS) tablet, Take 1 tablet by mouth daily at 6 PM., Disp: , Rfl:    Multiple Vitamins-Minerals (PRESERVISION AREDS 2) CAPS, Take 1 capsule by mouth in the morning and at bedtime., Disp: , Rfl:    polyethylene glycol (MIRALAX / GLYCOLAX) 17 g packet, Take 17 g by mouth daily. (Patient taking differently: Take 17 g by mouth as needed.), Disp: 14 each, Rfl: 0   Probiotic Product (DAILY PROBIOTIC) CAPS, Take 1 capsule by mouth daily. 30 billion live culture with 12   probiotic strains, Disp: , Rfl:    Triamcinolone Acetonide (TRIAMCINOLONE 0.1 % CREAM : EUCERIN) CREA, Apply 1 application topically 2 (two) times daily as needed for rash, itching or irritation., Disp: , Rfl:    Zinc Oxide (TRIPLE PASTE) 12.8 % ointment, Apply 1 application topically as needed for irritation., Disp: , Rfl:    amoxicillin (AMOXIL) 500 MG capsule, TAKE 1 CAPSULE BY MOUTH EVERY DAY, Disp: 90 capsule, Rfl: 3   Review of Systems  Constitutional:  Negative for activity change, appetite change, chills, diaphoresis, fatigue, fever and unexpected weight change.  HENT:  Negative for congestion, rhinorrhea,  sinus pressure, sneezing, sore throat and trouble swallowing.   Eyes:  Negative for photophobia and visual disturbance.  Respiratory:  Negative for cough, chest tightness, shortness of breath, wheezing and stridor.   Cardiovascular:  Negative for chest pain, palpitations and leg swelling.  Gastrointestinal:  Negative for abdominal distention, abdominal pain, anal bleeding, blood in stool, constipation, diarrhea, nausea and vomiting.  Genitourinary:  Negative for difficulty urinating, dysuria, flank pain and hematuria.  Musculoskeletal:  Negative for arthralgias, back pain, gait problem, joint swelling and myalgias.  Skin:  Negative for color change, pallor, rash and wound.  Neurological:  Negative for dizziness, tremors, weakness and light-headedness.  Hematological:  Negative for adenopathy. Does not bruise/bleed easily.  Psychiatric/Behavioral:  Negative for agitation, behavioral problems, confusion, decreased concentration, dysphoric mood and sleep disturbance.        Objective:   Physical Exam Constitutional:      General: She is not in acute distress.    Appearance: Normal appearance. She is well-developed. She is not ill-appearing or diaphoretic.  HENT:     Head: Normocephalic and atraumatic.     Right Ear: Hearing and external ear normal.     Left Ear: Hearing and  external ear normal.     Nose: No nasal deformity or rhinorrhea.  Eyes:     General: No scleral icterus.    Conjunctiva/sclera: Conjunctivae normal.     Right eye: Right conjunctiva is not injected.     Left eye: Left conjunctiva is not injected.     Pupils: Pupils are equal, round, and reactive to light.  Neck:     Vascular: No JVD.  Cardiovascular:     Rate and Rhythm: Normal rate and regular rhythm.     Heart sounds: S1 normal and S2 normal.  Pulmonary:     Effort: Pulmonary effort is normal. No respiratory distress.     Breath sounds: No wheezing.  Abdominal:     General: Bowel sounds are normal. There is no distension.     Palpations: Abdomen is soft.     Tenderness: There is no abdominal tenderness.  Musculoskeletal:        General: Normal range of motion.     Right shoulder: Normal.     Left shoulder: Normal.     Cervical back: Normal range of motion and neck supple.     Right hip: Normal.     Left hip: Normal.     Right knee: Normal.     Left knee: Normal.  Lymphadenopathy:     Head:     Right side of head: No submandibular, preauricular or posterior auricular adenopathy.     Left side of head: No submandibular, preauricular or posterior auricular adenopathy.     Cervical: No cervical adenopathy.     Right cervical: No superficial or deep cervical adenopathy.    Left cervical: No superficial or deep cervical adenopathy.  Skin:    General: Skin is warm and dry.     Coloration: Skin is not pale.     Findings: Erythema present. No abrasion, bruising, ecchymosis, lesion or rash.     Nails: There is no clubbing.  Neurological:     Mental Status: She is alert and oriented to person, place, and time.     Sensory: No sensory deficit.     Coordination: Coordination normal.     Gait: Gait normal.  Psychiatric:        Attention and Perception: She is attentive.  Mood and Affect: Mood normal.        Speech: Speech normal.        Behavior: Behavior normal. Behavior  is cooperative.        Thought Content: Thought content normal.        Judgment: Judgment normal.    Lower extremities 09/24/2023:            Assessment & Plan:   Recurrent cellulitis: Normally I would take a reactive strategy rather than a prophylactic strategy and patient only had 1 episode of cellulitis but she does live alone and I think we are not causing any harm with the amoxicillin and she has not experienced recurrences so we will continue with current practice of prophylactic amoxicillin.  If she develops breakthrough cellulitis she will need to go to a treatment dose which should be 503 times daily.  Will make sure that she has routine CBC and BMP as part of her labs her PCP also works in epic namely Dr. Okey Dupre so I will build to see the labs she has drawn.  Seen counseling recommended updated flu which he received and COVID-19 vaccine which she refused and Prevnar which she will receive

## 2023-09-24 NOTE — Addendum Note (Signed)
Addended by: Clayborne Artist A on: 09/24/2023 03:27 PM   Modules accepted: Orders

## 2023-10-16 ENCOUNTER — Ambulatory Visit: Payer: Medicare Other | Admitting: Internal Medicine

## 2023-10-29 ENCOUNTER — Ambulatory Visit: Payer: Medicare Other | Admitting: Internal Medicine

## 2023-12-26 ENCOUNTER — Ambulatory Visit: Payer: PRIVATE HEALTH INSURANCE | Admitting: Podiatry

## 2024-02-13 ENCOUNTER — Encounter: Payer: Self-pay | Admitting: Internal Medicine

## 2024-02-18 LAB — HM MAMMOGRAPHY

## 2024-03-13 ENCOUNTER — Ambulatory Visit

## 2024-03-13 VITALS — Ht 65.0 in | Wt 137.0 lb

## 2024-03-13 DIAGNOSIS — Z0000 Encounter for general adult medical examination without abnormal findings: Secondary | ICD-10-CM

## 2024-03-13 DIAGNOSIS — Z780 Asymptomatic menopausal state: Secondary | ICD-10-CM

## 2024-03-13 NOTE — Progress Notes (Signed)
 Subjective:   [name] [name] is a 89 y.o. who presents for a Medicare Wellness preventive visit.  Visit Complete: In person  Persons Participating in Visit: Patient assisted by daughter.  AWV Questionnaire: No: Patient Medicare AWV questionnaire was not completed prior to this visit.  Cardiac Risk Factors include: advanced age (>39men, >34 women);hypertension;Other (see comment);dyslipidemia, Risk factor comments: Aortic atherosclerosis,  CHF, A-Fib     Objective:    Today's Vitals   03/13/24 1345  Weight: 137 lb (62.1 kg)  Height: 5\' 5"  (1.651 m)   Body mass index is 22.8 kg/m.     03/13/2024    2:06 PM 07/29/2020    6:00 AM 07/26/2020   11:04 AM 07/25/2020   10:44 AM 09/29/2017   11:08 AM 05/21/2017    5:21 PM 05/21/2017    3:50 PM  Advanced Directives  Does Patient Have a Medical Advance Directive? Yes No No No No  No  Type of Advance Directive Healthcare Power of Attorney        Would patient like information on creating a medical advance directive?  Yes (Inpatient - patient requests chaplain consult to create a medical advance directive) No - Patient declined No - Patient declined No - Patient declined -- No - Patient declined    Current Medications (verified) Outpatient Encounter Medications as of 03/13/2024  Medication Sig   acetaminophen (TYLENOL) 500 MG tablet Take 1,000 mg by mouth every 6 (six) hours as needed for headache (pain). For pain   ALPRAZolam (XANAX) 0.25 MG tablet Take 1 tablet (0.25 mg total) by mouth daily as needed. for anxiety   amoxicillin (AMOXIL) 500 MG capsule TAKE 1 CAPSULE BY MOUTH EVERY DAY   apixaban (ELIQUIS) 5 MG TABS tablet Take 1 tablet (5 mg total) by mouth 2 (two) times daily.   atorvastatin (LIPITOR) 20 MG tablet Take 1 tablet (20 mg total) by mouth daily.   Calcium Carbonate-Vitamin D (CALCIUM-VITAMIN D) 500-200 MG-UNIT per tablet Take 1 tablet by mouth every other day.   Carboxymethylcellulose Sodium (THERATEARS OP) Place 1 drop into  both eyes 2 (two) times daily as needed (dry eyes).   diltiazem (CARDIZEM CD) 120 MG 24 hr capsule Take 2 capsules (240 mg total) by mouth daily.   docusate sodium (COLACE) 100 MG capsule Take 1 capsule (100 mg total) by mouth 2 (two) times daily. (Patient taking differently: Take 100 mg by mouth as needed.)   feeding supplement, GLUCERNA SHAKE, (GLUCERNA SHAKE) LIQD Take 237 mLs by mouth 2 (two) times daily between meals.   ferrous sulfate 325 (65 FE) MG EC tablet Take 325 mg by mouth daily.   furosemide (LASIX) 40 MG tablet Take 1 tablet (40 mg total) by mouth at bedtime. 9pm   Hydrocortisone (GERHARDT'S BUTT CREAM) CREA Apply 1 application topically daily as needed for irritation.    latanoprost (XALATAN) 0.005 % ophthalmic solution SMARTSIG:1 Drop(s) In Eye(s) Every Evening   metoprolol tartrate (LOPRESSOR) 50 MG tablet Take 1 tablet (50 mg total) by mouth 2 (two) times daily.   Multiple Vitamins-Minerals (MULTIVITAMIN WITH MINERALS) tablet Take 1 tablet by mouth daily at 6 PM.   Multiple Vitamins-Minerals (PRESERVISION AREDS 2) CAPS Take 1 capsule by mouth in the morning and at bedtime.   polyethylene glycol (MIRALAX / GLYCOLAX) 17 g packet Take 17 g by mouth daily. (Patient taking differently: Take 17 g by mouth as needed.)   Probiotic Product (DAILY PROBIOTIC) CAPS Take 1 capsule by mouth daily. 30 billion live  culture with 12  probiotic strains   Triamcinolone Acetonide (TRIAMCINOLONE 0.1 % CREAM : EUCERIN) CREA Apply 1 application topically 2 (two) times daily as needed for rash, itching or irritation.   Zinc Oxide (TRIPLE PASTE) 12.8 % ointment Apply 1 application topically as needed for irritation.   No facility-administered encounter medications on file as of 03/13/2024.    Allergies (verified) Antihistamines, diphenhydramine-type and Oxycodone-acetaminophen   History: Past Medical History:  Diagnosis Date   A-fib (HCC)    paroxysmal   Aftercare for healing traumatic fracture of  arm, unspecified    H/O: hemorrhoidectomy    H/O: hysterectomy    Hyperlipidemia    Hypertension    Malignant melanoma of skin of trunk, except scrotum (HCC)    Other postprocedural status(V45.89)    Recurrent cellulitis 09/24/2023   Rosacea    S/P cholecystectomy    Sciatica    right   Sprain of lumbar region    Past Surgical History:  Procedure Laterality Date   ABDOMINAL HYSTERECTOMY     BLADDER SUSPENSION     never go surgery because it got canceled due to heart rate    cataract Bilateral 2019   catheter ablation of atrail flutter     CHOLECYSTECTOMY     HERNIA REPAIR     Family History  Problem Relation Age of Onset   Heart failure Mother    Heart failure Father    Liver cancer Daughter    Social History   Socioeconomic History   Marital status: Widowed    Spouse name: Not on file   Number of children: 6   Years of education: 10   Highest education level: Not on file  Occupational History   Occupation: Public librarian    Employer: RETIRED    Comment: retired  Tobacco Use   Smoking status: Never   Smokeless tobacco: Never  Vaping Use   Vaping status: Never Used  Substance and Sexual Activity   Alcohol use: No   Drug use: No   Sexual activity: Never  Other Topics Concern   Not on file  Social History Narrative   10th grade. Married '51.4 sons- '52, '54, '58, '70; 2 daughters- '56, '64 older girl died of ovarian cancer.11 grandchildren, 2 great grands. 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       Lives alone./2025   Social Drivers of Health   Financial Resource Strain: Low Risk  (03/13/2024)   Overall Financial Resource Strain (CARDIA)    Difficulty of Paying Living Expenses: Not hard at all  Food Insecurity: No Food Insecurity (03/13/2024)   Hunger Vital Sign    Worried About Running Out of Food in the Last Year: Never true    Ran Out of Food in the Last Year: Never true   Transportation Needs: No Transportation Needs (03/13/2024)   PRAPARE - Administrator, Civil Service (Medical): No    Lack of Transportation (Non-Medical): No  Physical Activity: Inactive (03/13/2024)   Exercise Vital Sign    Days of Exercise per Week: 0 days    Minutes of Exercise per Session: 0 min  Stress: No Stress Concern Present (03/13/2024)   [employer] of Occupational Health - Occupational Stress Questionnaire    Feeling of Stress : Not at all  Social Connections: Moderately Isolated (03/13/2024)   Social Connection and Isolation Panel [NHANES]    Frequency of Communication with Friends and Family: More than  three times a week    Frequency of Social Gatherings with Friends and Family: Twice a week    Attends Religious Services: More than 4 times per year    Active Member of [employer] or Organizations: No    Attends Banker Meetings: Never    Marital Status: Widowed    Tobacco Counseling Counseling given: Not Answered    Clinical Intake:  Pre-visit preparation completed: Yes        BMI - recorded: 22.8 Nutritional Status: BMI of 19-24  Normal Nutritional Risks: None  Lab Results  Component Value Date   HGBA1C 5.9 05/15/2022   HGBA1C 6.2 (H) 10/06/2020   HGBA1C 6.8 (H) 07/31/2020     How often do you need to have someone help you when you read instructions, pamphlets, or other written materials from your doctor or pharmacy?: 1 - Never  Interpreter Needed?: No  Information entered by :: Xochitl Egle, RMA   Activities of Daily Living     03/13/2024    1:45 PM  In your present state of health, do you have any difficulty performing the following activities:  Hearing? 0  Vision? 0  Difficulty concentrating or making decisions? 0  Walking or climbing stairs? 0  Dressing or bathing? 0  Doing errands, shopping? 0  Comment patient still drives and family  Preparing Food and eating ? N  Using the Toilet? N  In the past six months,  have you accidently leaked urine? Y  Do you have problems with loss of bowel control? N  Managing your Medications? N  Managing your Finances? N  Housekeeping or managing your Housekeeping? N    Patient Care Team: Myrlene Broker, MD as PCP - General (Internal Medicine) Marinus Maw, MD as PCP - Electrophysiology (Cardiology) Marinus Maw, MD (Cardiology) Billie Ruddy, OD (Optometry) [place], Kirstie Peri, DPM (Podiatry) [place], Garry Heater, [place] (Inactive) (Pharmacist) Kathyrn Sheriff, [facility] (Inactive) as Pharmacist (Pharmacist)  Indicate any recent Medical Services you may have received from other than [facility] providers in the past year (date may be approximate).     Assessment:   This is a routine wellness examination for Izabella.  Hearing/Vision screen Hearing Screening - Comments:: Denies hearing difficulties   Vision Screening - Comments:: Wears eyeglasses   Goals Addressed             This Visit's Progress    Maintian current health status   On track    Stay active and independent, enjoy life, family, and church       Depression Screen     03/13/2024    2:11 PM 09/24/2023    2:45 PM 10/23/2022    2:24 PM 05/15/2022    2:22 PM 10/17/2021    1:44 PM 12/10/2020    2:21 PM 08/15/2019    3:16 PM  PHQ 2/9 Scores  PHQ - 2 Score 0 0 0 0 0 0 0  PHQ- 9 Score 6          Fall Risk     03/13/2024    2:07 PM 09/24/2023    2:45 PM 05/24/2023    8:46 AM 10/23/2022    2:22 PM 05/15/2022    2:22 PM  Fall Risk   Falls in the past year? 0 0 0 0 0  Number falls in past yr: 0 0 0    Injury with Fall? 0 0 0    Risk for fall due to : No Fall Risks  Impaired balance/gait   Risk for fall due to: Comment    Cane   Follow up Falls prevention discussed;Falls evaluation completed  Falls evaluation completed Falls evaluation completed     MEDICARE RISK AT HOME:  Medicare Risk at Home Any stairs in or around the home?: Yes (outisde) If so, are there any without  handrails?: Yes Home free of loose throw rugs in walkways, pet beds, electrical cords, etc?: Yes Adequate lighting in your home to reduce risk of falls?: Yes Life alert?: Yes Use of a cane, walker or w/c?: Yes Grab bars in the bathroom?: Yes Shower chair or bench in shower?: Yes Elevated toilet seat or a handicapped toilet?: Yes  TIMED UP AND GO:  Was the test performed?  Yes  Length of time to ambulate 10 feet: 35 sec Gait slow and steady with assistive device  Cognitive Function: 6CIT completed    05/21/2017    3:46 PM  MMSE - Mini Mental State Exam  Orientation to time 5  Orientation to Place 5  Registration 3  Attention/ Calculation 5  Recall 3  Language- name 2 objects 2  Language- repeat 1  Language- follow 3 step command 3  Language- read & follow direction 1  Write a sentence 1  Copy design 1  Total score 30        03/13/2024    1:46 PM  6CIT Screen  What Year? 0 points  What month? 0 points  What time? 0 points  Count back from 20 0 points  Months in reverse 0 points  Repeat phrase 0 points  Total Score 0 points    Immunizations Immunization History  Administered Date(s) Administered   Fluad Quad(high Dose 65+) 08/15/2019, 10/01/2020, 10/17/2021, 10/23/2022   Fluad Trivalent(High Dose 65+) 08/01/2023   Influenza Whole 09/15/2010   Influenza, High Dose Seasonal PF 10/20/2013, 08/25/2016, 08/28/2017, 08/20/2018   Influenza,inj,Quad PF,6+ Mos 08/21/2014, 08/13/2015   PFIZER(Purple Top)SARS-COV-2 Vaccination 12/22/2019, 01/12/2020   PNEUMOCOCCAL CONJUGATE-20 09/24/2023   Pneumococcal Conjugate-13 08/13/2015   Pneumococcal Polysaccharide-23 05/27/2012   Td 05/27/2012    Screening Tests Health Maintenance  Topic Date Due   Zoster Vaccines- Shingrix (1 of 2) Never done   DTaP/Tdap/Td (2 - Tdap) 05/27/2022   COVID-19 Vaccine (3 - 2024-25 season) 07/29/2023   INFLUENZA VACCINE  06/27/2024   MAMMOGRAM  02/17/2025   Medicare Annual Wellness (AWV)   03/13/2025   Pneumonia Vaccine 50+ Years old  Completed   DEXA SCAN  Completed   HPV VACCINES  Aged Out   Meningococcal B Vaccine  Aged Out    Health Maintenance  Health Maintenance Due  Topic Date Due   Zoster Vaccines- Shingrix (1 of 2) Never done   DTaP/Tdap/Td (2 - Tdap) 05/27/2022   COVID-19 Vaccine (3 - 2024-25 season) 07/29/2023   Health Maintenance Items Addressed: DEXA ordered, See Nurse Notes  Additional Screening:  Vision Screening: Recommended annual ophthalmology exams for early detection of glaucoma and other disorders of the eye.  Dental Screening: Recommended annual dental exams for proper oral hygiene  Community Resource Referral / Chronic Care Management: CRR required this visit?  No   CCM required this visit?  No     Plan:     I have personally reviewed and noted the following in the patient's chart:   Medical and social history Use of alcohol, tobacco or illicit drugs  Current medications and supplements including opioid prescriptions. Patient is not currently taking opioid prescriptions. Functional ability and status Nutritional  status Physical activity Advanced directives List of other physicians Hospitalizations, surgeries, and ER visits in previous 12 months Vitals Screenings to include cognitive, depression, and falls Referrals and appointments  In addition, I have reviewed and discussed with patient certain preventive protocols, quality metrics, and best practice recommendations. A written personalized care plan for preventive services as well as general preventive health recommendations were provided to patient.     Leeann Bady L Jilleen Essner, CMA   03/13/2024   After Visit Summary: (MyChart) Due to this being a telephonic visit, the after visit summary with patients personalized plan was offered to patient via MyChart   Notes: Please refer to Routing Comments.

## 2024-03-13 NOTE — Patient Instructions (Addendum)
 [name] [name] , Thank you for taking time to come for your Medicare Wellness Visit. I appreciate your ongoing commitment to your health goals. Please review the following plan we discussed and let me know if I can assist you in the future.   Referrals/Orders/Follow-Ups/Clinician Recommendations: It was nice  meeting you today.  You are due for a Tetanus vaccine.  Keep up the good work and Happy Easter.  This is a list of the screening recommended for you and due dates:  Health Maintenance  Topic Date Due   Zoster (Shingles) Vaccine (1 of 2) Never done   DTaP/Tdap/Td vaccine (2 - Tdap) 05/27/2022   Medicare Annual Wellness Visit  05/16/2023   COVID-19 Vaccine (3 - 2024-25 season) 07/29/2023   Flu Shot  06/27/2024   Mammogram  02/17/2025   Pneumonia Vaccine  Completed   DEXA scan (bone density measurement)  Completed   HPV Vaccine  Aged Out   Meningitis B Vaccine  Aged Out    Advanced directives: (Copy Requested) Please bring a copy of your health care power of attorney and living will to the office to be added to your chart at your convenience. You can mail to [facility] [address] [place], [place] [address] or email to [email or web address]  Next Medicare Annual Wellness Visit scheduled for next year: Yes

## 2024-03-19 NOTE — Progress Notes (Signed)
 LDM informing pt to call office for a visit.

## 2024-03-20 ENCOUNTER — Other Ambulatory Visit: Payer: Self-pay | Admitting: Internal Medicine

## 2024-03-21 ENCOUNTER — Encounter: Payer: Self-pay | Admitting: Internal Medicine

## 2024-03-31 ENCOUNTER — Ambulatory Visit: Admitting: Internal Medicine

## 2024-04-01 ENCOUNTER — Ambulatory Visit: Admitting: Internal Medicine

## 2024-04-01 ENCOUNTER — Encounter: Payer: Self-pay | Admitting: Internal Medicine

## 2024-04-01 VITALS — BP 112/68 | HR 90 | Temp 97.8°F | Ht 65.0 in | Wt 136.0 lb

## 2024-04-01 DIAGNOSIS — I4821 Permanent atrial fibrillation: Secondary | ICD-10-CM

## 2024-04-01 DIAGNOSIS — I5032 Chronic diastolic (congestive) heart failure: Secondary | ICD-10-CM

## 2024-04-01 DIAGNOSIS — I700 Atherosclerosis of aorta: Secondary | ICD-10-CM

## 2024-04-01 DIAGNOSIS — R7303 Prediabetes: Secondary | ICD-10-CM

## 2024-04-01 DIAGNOSIS — Z0000 Encounter for general adult medical examination without abnormal findings: Secondary | ICD-10-CM | POA: Diagnosis not present

## 2024-04-01 DIAGNOSIS — I10 Essential (primary) hypertension: Secondary | ICD-10-CM | POA: Diagnosis not present

## 2024-04-01 DIAGNOSIS — E782 Mixed hyperlipidemia: Secondary | ICD-10-CM | POA: Diagnosis not present

## 2024-04-01 LAB — COMPREHENSIVE METABOLIC PANEL WITH GFR
ALT: 18 U/L (ref 0–35)
AST: 22 U/L (ref 0–37)
Albumin: 4.2 g/dL (ref 3.5–5.2)
Alkaline Phosphatase: 100 U/L (ref 39–117)
BUN: 15 mg/dL (ref 6–23)
CO2: 25 meq/L (ref 19–32)
Calcium: 9.6 mg/dL (ref 8.4–10.5)
Chloride: 105 meq/L (ref 96–112)
Creatinine, Ser: 0.51 mg/dL (ref 0.40–1.20)
GFR: 82.14 mL/min (ref >60.00)
Glucose, Bld: 115 mg/dL — ABNORMAL HIGH (ref 70–99)
Potassium: 4.2 meq/L (ref 3.5–5.1)
Sodium: 139 meq/L (ref 135–145)
Total Bilirubin: 1 mg/dL (ref 0.2–1.2)
Total Protein: 7.3 g/dL (ref 6.0–8.3)

## 2024-04-01 LAB — HEMOGLOBIN A1C: Hgb A1c MFr Bld: 5.9 % (ref 4.6–6.5)

## 2024-04-01 LAB — MICROALBUMIN / CREATININE URINE RATIO
Creatinine,U: 51.4 mg/dL
Microalb Creat Ratio: 43.1 mg/g — ABNORMAL HIGH (ref 0.0–30.0)
Microalb, Ur: 2.2 mg/dL — ABNORMAL HIGH (ref 0.0–1.9)

## 2024-04-01 LAB — LIPID PANEL
Cholesterol: 142 mg/dL (ref 0–200)
HDL: 71.2 mg/dL (ref >39.00)
LDL Cholesterol: 61 mg/dL (ref 0–99)
NonHDL: 70.54
Total CHOL/HDL Ratio: 2
Triglycerides: 47 mg/dL (ref 0.0–149.0)
VLDL: 9.4 mg/dL (ref 0.0–40.0)

## 2024-04-01 LAB — CBC
HCT: 30.2 % — ABNORMAL LOW (ref 36.0–46.0)
Hemoglobin: 9.7 g/dL — ABNORMAL LOW (ref 12.0–15.0)
MCHC: 32 g/dL (ref 30.0–36.0)
MCV: 78.5 fl (ref 78.0–100.0)
Platelets: 376 10*3/uL (ref 150.0–400.0)
RBC: 3.84 Mil/uL — ABNORMAL LOW (ref 3.87–5.11)
RDW: 16.5 % — ABNORMAL HIGH (ref 11.5–15.5)
WBC: 6.5 10*3/uL (ref 4.0–10.5)

## 2024-04-01 LAB — VITAMIN D 25 HYDROXY (VIT D DEFICIENCY, FRACTURES): VITD: 34.35 ng/mL (ref 30.00–100.00)

## 2024-04-01 LAB — VITAMIN B12: Vitamin B-12: 874 pg/mL (ref 211–911)

## 2024-04-01 LAB — FERRITIN: Ferritin: 31.9 ng/mL (ref 10.0–291.0)

## 2024-04-01 LAB — TSH: TSH: 3.53 u[IU]/mL (ref 0.35–5.50)

## 2024-04-01 MED ORDER — POLYETHYLENE GLYCOL 3350 17 G PO PACK: 17.0000 g | PACK | Freq: Every day | ORAL | 11 refills | Status: AC

## 2024-04-01 MED ORDER — DILTIAZEM HCL ER COATED BEADS 120 MG PO CP24: 240.0000 mg | ORAL_CAPSULE | Freq: Every day | ORAL | 3 refills | Status: AC

## 2024-04-01 MED ORDER — FLUCONAZOLE 150 MG PO TABS: 150.0000 mg | ORAL_TABLET | Freq: Every day | ORAL | 0 refills | Status: DC

## 2024-04-01 MED ORDER — METOPROLOL TARTRATE 50 MG PO TABS: 50.0000 mg | ORAL_TABLET | Freq: Two times a day (BID) | ORAL | 3 refills | Status: AC

## 2024-04-01 MED ORDER — NYSTATIN-TRIAMCINOLONE 100000-0.1 UNIT/GM-% EX OINT: 1.0000 | TOPICAL_OINTMENT | Freq: Two times a day (BID) | CUTANEOUS | 3 refills | Status: AC

## 2024-04-01 MED ORDER — FUROSEMIDE 40 MG PO TABS: 40.0000 mg | ORAL_TABLET | Freq: Every day | ORAL | 3 refills | Status: AC

## 2024-04-01 MED ORDER — APIXABAN 5 MG PO TABS: 5.0000 mg | ORAL_TABLET | Freq: Two times a day (BID) | ORAL | 3 refills | Status: AC

## 2024-04-01 MED ORDER — ATORVASTATIN CALCIUM 20 MG PO TABS: 20.0000 mg | ORAL_TABLET | Freq: Every day | ORAL | 3 refills | Status: AC

## 2024-04-01 NOTE — Assessment & Plan Note (Signed)
 Flu shot yearly. Pneumonia complete. Shingrix due at pharmacy. Tetanus due at pharmacy. Colonoscopy aged out. Mammogram aged out, pap smear aged out and dexa aged out. Counseled about sun safety and mole surveillance. Counseled about the dangers of distracted driving. Given 10 year screening recommendations.

## 2024-04-01 NOTE — Assessment & Plan Note (Signed)
 Checking CMP and CBC. Uses lasix  daily and no flare today. Continue.

## 2024-04-01 NOTE — Assessment & Plan Note (Signed)
 Taking statin and will continue.

## 2024-04-01 NOTE — Progress Notes (Signed)
   Subjective:   Patient ID: [name] [name] [facility], female    DOB: 05/31/1931, 89 y.o.   MRN: [id number]  HPI The patient is here for physical.  PMH, [facility], social history reviewed and updated  Review of Systems  Constitutional:  Positive for activity change.  HENT: Negative.    Eyes: Negative.   Respiratory:  Negative for cough, chest tightness and shortness of breath.   Cardiovascular:  Negative for chest pain, palpitations and leg swelling.  Gastrointestinal:  Negative for abdominal distention, abdominal pain, constipation, diarrhea, nausea and vomiting.  Musculoskeletal:  Positive for back pain.  Skin: Negative.   Neurological: Negative.   Psychiatric/Behavioral: Negative.      Objective:  Physical Exam Constitutional:      Appearance: She is well-developed.  HENT:     Head: Normocephalic and atraumatic.  Cardiovascular:     Rate and Rhythm: Normal rate and regular rhythm.  Pulmonary:     Effort: Pulmonary effort is normal. No respiratory distress.     Breath sounds: Normal breath sounds. No wheezing or rales.  Abdominal:     General: Bowel sounds are normal. There is no distension.     Palpations: Abdomen is soft.     Tenderness: There is no abdominal tenderness. There is no rebound.  Musculoskeletal:        General: Tenderness present.     Cervical back: Normal range of motion.  Skin:    General: Skin is warm and dry.  Neurological:     Mental Status: She is alert and oriented to person, place, and time.     Coordination: Coordination normal.     Vitals:   04/01/24 1430  BP: 112/68  Pulse: 90  Temp: 97.8 F (36.6 C)  TempSrc: Oral  SpO2: 97%  Weight: 136 lb (61.7 kg)  Height: 5\' 5"  (1.651 m)    Assessment & Plan:

## 2024-04-01 NOTE — Patient Instructions (Addendum)
 We have sent in the refills. We have also sent in cream to use on the yeast rash twice a day.   We have sent in diflucan  to take 1 pill daily for 1 week to help this heal faster.

## 2024-04-01 NOTE — Assessment & Plan Note (Signed)
 Checking lipid panel and adjust as needed statin.

## 2024-04-01 NOTE — Assessment & Plan Note (Signed)
 Checking HGA1c today. Adjust as needed.

## 2024-04-01 NOTE — Assessment & Plan Note (Signed)
 BP at goal on diltiazem  and lasix  and metoprolol . Checking CMP and adjust as needed.

## 2024-04-01 NOTE — Assessment & Plan Note (Signed)
 Taking eliquis  and diltiazem  and continue. Checking CMP and CBC.

## 2024-04-02 LAB — URINALYSIS, ROUTINE W REFLEX MICROSCOPIC
Bilirubin Urine: NEGATIVE
Ketones, ur: NEGATIVE
Nitrite: POSITIVE — AB
Specific Gravity, Urine: 1.02 (ref 1.000–1.030)
Total Protein, Urine: NEGATIVE
Urine Glucose: NEGATIVE
Urobilinogen, UA: 0.2 (ref 0.0–1.0)
pH: 6 (ref 5.0–8.0)

## 2024-04-07 ENCOUNTER — Other Ambulatory Visit: Payer: Self-pay | Admitting: Internal Medicine

## 2024-04-07 ENCOUNTER — Encounter: Payer: Self-pay | Admitting: Internal Medicine

## 2024-04-07 MED ORDER — NITROFURANTOIN MONOHYD MACRO 100 MG PO CAPS: 100.0000 mg | ORAL_CAPSULE | Freq: Two times a day (BID) | ORAL | 0 refills | Status: AC

## 2024-04-08 ENCOUNTER — Ambulatory Visit (INDEPENDENT_AMBULATORY_CARE_PROVIDER_SITE_OTHER): Admitting: Podiatry

## 2024-04-08 ENCOUNTER — Encounter: Payer: Self-pay | Admitting: Podiatry

## 2024-04-08 DIAGNOSIS — M79675 Pain in left toe(s): Secondary | ICD-10-CM | POA: Diagnosis not present

## 2024-04-08 DIAGNOSIS — M79674 Pain in right toe(s): Secondary | ICD-10-CM

## 2024-04-08 DIAGNOSIS — B351 Tinea unguium: Secondary | ICD-10-CM | POA: Diagnosis not present

## 2024-04-08 NOTE — Progress Notes (Signed)
 This patient returns to my office for at risk foot care.  This patient requires this care by a professional since this patient will be at risk due to having coagulation defect due to eliquis.  This patient is unable to cut nails herself since the patient cannot reach her nails.These nails are painful walking and wearing shoes.  This patient presents for at risk foot care today.  General Appearance  Alert, conversant and in no acute stress.  Vascular  Dorsalis pedis and posterior tibial  pulses are absent   bilaterally.  Capillary return is within normal limits  bilaterally. Temperature is within normal limits  bilaterally.  Neurologic  Senn-Weinstein monofilament wire test within normal limits  bilaterally. Muscle power within normal limits bilaterally.  Nails Thick disfigured discolored nails with subungual debris  from hallux to fifth toes bilaterally. No evidence of bacterial infection or drainage bilaterally.  Orthopedic  No limitations of motion  feet .  No crepitus or effusions noted.  No bony pathology or digital deformities noted.  Skin  normotropic skin with no porokeratosis noted bilaterally.  No signs of infections or ulcers noted.     Onychomycosis  Pain in right toes  Pain in left toes  Consent was obtained for treatment procedures.   Mechanical debridement of nails 1-5  bilaterally performed with a nail nipper.  Filed with dremel without incident.    Return office visit   3 months                   Told patient to return for periodic foot care and evaluation due to potential at risk complications.   Helane Gunther DPM

## 2024-04-09 ENCOUNTER — Ambulatory Visit: Payer: Self-pay

## 2024-06-09 ENCOUNTER — Ambulatory Visit: Payer: Self-pay

## 2024-06-09 NOTE — Telephone Encounter (Signed)
 FYI Only or Action Required?: FYI only for provider.  Patient was last seen in primary care on 04/01/2024 by Rollene Almarie LABOR, MD.  Called Nurse Triage reporting Abdominal Pain.  Symptoms began a week ago.  Interventions attempted: Nothing.  Symptoms are: stable.  Triage Disposition: See Physician Within 24 Hours  Patient/caregiver understands and will follow disposition?: Yes   Copied from CRM [id number]. Topic: Clinical - Red Word Triage >> Jun 09, 2024 10:41 AM Robinson DEL wrote: [facility] that prompted transfer to Nurse Triage: Had diarrhea with tarry/black looking stools since Wednesday, fatigue, frequent urination thinks she has UTI. Friday night pain between navel and pelvic area really bad, used the bathroom and went away. Reason for Disposition  [1] MILD pain (e.g., does not interfere with normal activities) AND [2] pain comes and goes (cramps) AND [3] present > 48 hours  (Exception: This same abdominal pain is a chronic symptom recurrent or ongoing AND present > 4 weeks.)  Answer Assessment - Initial Assessment Questions Patient says on Monday 7/6 she had urine frequency going to the bathroom every 2 hours. She says each time she had a BM that was dark black in color. She says she takes iron pills and knows that will make the stools dark, so she looked to see if there was red blood and never saw blood. She says that stopped around Wednesday then she got to the point of no appetite. She says the only fluids she was taking in was 8 oz with her pills. She says on Friday night/early Saturday morning her stomach started hurting and she was having loose BM's. She says after the last large one that was around 3am on Saturday, the stomach pain stopped. She's calling for an appointment on Wednesday late afternoon. Denies any symptoms at this time. Advised 4p with Dr. Garald due to no afternoon availability with PCP. She agreed.   1. LOCATION: Where does it hurt?      Lower abdomen  pelvic area   3. ONSET: When did the pain begin? (e.g., minutes, hours or days ago)      Friday night/early Saturday morning about 0200-0300, then it went away after large bowel movement 4. SUDDEN: Gradual or sudden onset?     Sudden pain 5. PATTERN Does the pain come and go, or is it constant?     Stayed constant until after bowel movement 6. SEVERITY: How bad is the pain?  (e.g., Scale 1-10; mild, moderate, or severe)     No pain at this time 7. RECURRENT SYMPTOM: Have you ever had this type of stomach pain before? If Yes, ask: When was the last time? and What happened that time?      Only when have stomach flu 8. CAUSE: What do you think is causing the stomach pain? (e.g., gallstones, recent abdominal surgery)     I don't know 10. OTHER SYMPTOMS: Do you have any other symptoms? (e.g., back pain, diarrhea, fever, urination pain, vomiting)       Had frequency urine last week, bowel movement (loose stools) on Friday  Protocols used: Abdominal Pain - [facility]

## 2024-06-11 ENCOUNTER — Ambulatory Visit: Admitting: Internal Medicine

## 2024-06-11 ENCOUNTER — Other Ambulatory Visit (INDEPENDENT_AMBULATORY_CARE_PROVIDER_SITE_OTHER)

## 2024-06-11 ENCOUNTER — Encounter: Payer: Self-pay | Admitting: Internal Medicine

## 2024-06-11 VITALS — BP 118/60 | HR 92 | Temp 98.1°F | Ht 65.0 in | Wt 128.0 lb

## 2024-06-11 DIAGNOSIS — R7303 Prediabetes: Secondary | ICD-10-CM | POA: Diagnosis not present

## 2024-06-11 DIAGNOSIS — R197 Diarrhea, unspecified: Secondary | ICD-10-CM

## 2024-06-11 DIAGNOSIS — N390 Urinary tract infection, site not specified: Secondary | ICD-10-CM | POA: Diagnosis not present

## 2024-06-11 DIAGNOSIS — E559 Vitamin D deficiency, unspecified: Secondary | ICD-10-CM

## 2024-06-11 DIAGNOSIS — R350 Frequency of micturition: Secondary | ICD-10-CM

## 2024-06-11 DIAGNOSIS — R634 Abnormal weight loss: Secondary | ICD-10-CM | POA: Diagnosis not present

## 2024-06-11 DIAGNOSIS — D509 Iron deficiency anemia, unspecified: Secondary | ICD-10-CM

## 2024-06-11 DIAGNOSIS — D649 Anemia, unspecified: Secondary | ICD-10-CM | POA: Insufficient documentation

## 2024-06-11 MED ORDER — CEFUROXIME AXETIL 250 MG PO TABS
250.0000 mg | ORAL_TABLET | Freq: Two times a day (BID) | ORAL | 1 refills | Status: AC
Start: 2024-06-11 — End: ?

## 2024-06-11 MED ORDER — FLUCONAZOLE 150 MG PO TABS: 150.0000 mg | ORAL_TABLET | Freq: Every day | ORAL | 0 refills | Status: AC

## 2024-06-11 NOTE — Progress Notes (Signed)
 Subjective:  Patient ID: [name] [name] [facility], female    DOB: May 31, 1931  Age: 89 y.o. MRN: [id number]  CC: Abdominal Pain (Pt states she is having abdominal pain... Diarrhea x1 week and frequency in urination... Bowels are darker. Pt is also having lower abdominal pain.)   HPI [name] [name] [facility] presents for wt loss, diarrhea, possible  UTI (pt had another UTI in May). 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 She wants Vit D checked again plus other tests  Outpatient Medications Prior to Visit  Medication Sig Dispense Refill   acetaminophen  (TYLENOL ) 500 MG tablet Take 1,000 mg by mouth every 6 (six) hours as needed for headache (pain). For pain     ALPRAZolam  (XANAX ) 0.25 MG tablet Take 1 tablet (0.25 mg total) by mouth daily as needed. for anxiety 30 tablet 1   amoxicillin  (AMOXIL ) 500 MG capsule TAKE 1 CAPSULE BY MOUTH EVERY DAY 90 capsule 3   apixaban  (ELIQUIS ) 5 MG TABS tablet Take 1 tablet (5 mg total) by mouth 2 (two) times daily. 180 tablet 3   atorvastatin  (LIPITOR) 20 MG tablet Take 1 tablet (20 mg total) by mouth daily. 90 tablet 3   Calcium  Carbonate-Vitamin D  (CALCIUM -VITAMIN D ) 500-200 MG-UNIT per tablet Take 1 tablet by mouth every other day.     Carboxymethylcellulose Sodium (THERATEARS OP) Place 1 drop into both eyes 2 (two) times daily as needed (dry eyes).     diltiazem  (CARDIZEM  CD) 120 MG 24 hr capsule Take 2 capsules (240 mg total) by mouth daily. 180 capsule 3   docusate sodium  (COLACE) 100 MG capsule Take 1 capsule (100 mg total) by mouth 2 (two) times daily. (Patient taking differently: Take 100 mg by mouth as needed.) 10 capsule 0   feeding supplement, GLUCERNA SHAKE, (GLUCERNA SHAKE) LIQD Take 237 mLs by mouth 2 (two) times daily between meals.  0   ferrous sulfate 325 (65 FE) MG EC tablet Take 325 mg by mouth daily.     furosemide  (LASIX ) 40 MG tablet Take 1 tablet (40 mg total) by mouth at bedtime. 9pm 90 tablet 3   Hydrocortisone (GERHARDT'S BUTT CREAM) CREA Apply 1  application topically daily as needed for irritation.      latanoprost (XALATAN) 0.005 % ophthalmic solution SMARTSIG:1 Drop(s) In Eye(s) Every Evening     metoprolol  tartrate (LOPRESSOR ) 50 MG tablet Take 1 tablet (50 mg total) by mouth 2 (two) times daily. 180 tablet 3   Multiple Vitamins-Minerals (MULTIVITAMIN WITH MINERALS) tablet Take 1 tablet by mouth daily at 6 PM.     Multiple Vitamins-Minerals (PRESERVISION AREDS 2) CAPS Take 1 capsule by mouth in the morning and at bedtime.     nystatin -triamcinolone  ointment (MYCOLOG) Apply 1 Application topically 2 (two) times daily. 100 g 3   polyethylene glycol (MIRALAX  / GLYCOLAX ) 17 g packet Take 17 g by mouth daily. 3350 each 11   Probiotic Product (DAILY PROBIOTIC) CAPS Take 1 capsule by mouth daily. 30 billion live culture with 12  probiotic strains     Triamcinolone  Acetonide (TRIAMCINOLONE  0.1 % CREAM : EUCERIN) CREA Apply 1 application topically 2 (two) times daily as needed for rash, itching or irritation.     Zinc  Oxide (TRIPLE PASTE) 12.8 % ointment Apply 1 application topically as needed for irritation.     fluconazole  (DIFLUCAN ) 150 MG tablet Take 1 tablet (150 mg total) by mouth daily. 7 tablet 0   No facility-administered medications prior to visit.    ROS: Review  of Systems  Constitutional:  Positive for fatigue and unexpected weight change. Negative for activity change, appetite change and chills.  HENT:  Negative for congestion, mouth sores and sinus pressure.   Eyes:  Negative for visual disturbance.  Respiratory:  Negative for cough and chest tightness.   Cardiovascular:  Negative for leg swelling.  Gastrointestinal:  Positive for diarrhea. Negative for abdominal distention, abdominal pain, blood in stool, nausea and vomiting.  Genitourinary:  Positive for dysuria and frequency. Negative for difficulty urinating, vaginal bleeding, vaginal discharge and vaginal pain.  Musculoskeletal:  Positive for arthralgias, back pain, gait  problem, neck pain and neck stiffness.  Skin:  Negative for pallor and rash.  Neurological:  Negative for dizziness, tremors, weakness, numbness and headaches.  Psychiatric/Behavioral:  Negative for confusion and sleep disturbance.     Objective:  BP 118/60   Pulse 92   Temp 98.1 F (36.7 C) (Oral)   Ht 5' 5 (1.651 m)   Wt 128 lb (58.1 kg)   SpO2 95%   BMI 21.30 kg/m   BP Readings from Last 3 Encounters:  06/11/24 118/60  04/01/24 112/68  09/24/23 106/63    Wt Readings from Last 3 Encounters:  06/11/24 128 lb (58.1 kg)  04/01/24 136 lb (61.7 kg)  03/13/24 137 lb (62.1 kg)    Physical Exam Constitutional:      General: She is not in acute distress.    Appearance: She is well-developed. She is not ill-appearing or toxic-appearing.  HENT:     Head: Normocephalic.     Right Ear: External ear normal.     Left Ear: External ear normal.     Nose: Nose normal.  Eyes:     General:        Right eye: No discharge.        Left eye: No discharge.     Conjunctiva/sclera: Conjunctivae normal.     Pupils: Pupils are equal, round, and reactive to light.  Neck:     Thyroid : No thyromegaly.     Vascular: No JVD.     Trachea: No tracheal deviation.  Cardiovascular:     Rate and Rhythm: Normal rate and regular rhythm.     Heart sounds: Normal heart sounds.  Pulmonary:     Effort: No respiratory distress.     Breath sounds: No stridor. No wheezing.  Abdominal:     General: Bowel sounds are normal. There is no distension.     Palpations: Abdomen is soft. There is no mass.     Tenderness: There is no abdominal tenderness. There is no guarding or rebound.  Musculoskeletal:        General: No tenderness.     Cervical back: Normal range of motion and neck supple. No rigidity.  Lymphadenopathy:     Cervical: No cervical adenopathy.  Skin:    Findings: No erythema or rash.  Neurological:     Cranial Nerves: No cranial nerve deficit.     Motor: No abnormal muscle tone.      Coordination: Coordination normal.     Deep Tendon Reflexes: Reflexes normal.  Psychiatric:        Behavior: Behavior normal.        Thought Content: Thought content normal.        Judgment: Judgment normal.   Kyphosis Abd S/NT Using a cane Arthritic gait  Lab Results  Component Value Date   WBC 6.0 06/11/2024   HGB 10.8 (L) 06/11/2024   HCT 32.9 (L) 06/11/2024  PLT 330.0 06/11/2024   GLUCOSE 119 (H) 06/11/2024   CHOL 142 04/01/2024   TRIG 47.0 04/01/2024   HDL 71.20 04/01/2024   LDLCALC 61 04/01/2024   ALT 22 06/11/2024   AST 26 06/11/2024   NA 142 06/11/2024   K 3.7 06/11/2024   CL 106 06/11/2024   CREATININE 0.58 06/11/2024   BUN 13 06/11/2024   CO2 26 06/11/2024   TSH 2.96 06/11/2024   INR 2.1 (H) 08/07/2020   HGBA1C 5.9 04/01/2024   MICROALBUR 2.2 (H) 04/01/2024    VAS US  LOWER EXTREMITY VENOUS (DVT) ([facility] and [facility] 7a-7p) Result Date: 07/25/2020  Lower Venous DVT Study Indications: Pain, and Swelling. Sepsis.  Risk Factors: Atrial fibrillation, therapeutic on Coumadin . Anticoagulation: Coumadin . Limitations: Patient's pain with touch and RN turned lights on in room in order to draw blood and start IV. Comparison Study: No prior study on file Performing Technologist: Alberta Lis RVS  Examination Guidelines: A complete evaluation includes B-mode imaging, spectral Doppler, color Doppler, and power Doppler as needed of all accessible portions of each vessel. Bilateral testing is considered an integral part of a complete examination. Limited examinations for reoccurring indications may be performed as noted. The reflux portion of the exam is performed with the patient in reverse Trendelenburg.  +-----+---------------+---------+-----------+----------+----------------------+ RIGHTCompressibilityPhasicitySpontaneityPropertiesThrombus Aging         +-----+---------------+---------+-----------+----------+----------------------+ CFV                                                patent by color and                                                      Doppler                +-----+---------------+---------+-----------+----------+----------------------+   +---------+---------------+---------+-----------+----------+-------------------+ LEFT     CompressibilityPhasicitySpontaneityPropertiesThrombus Aging      +---------+---------------+---------+-----------+----------+-------------------+ CFV      Full           Yes      Yes                                      +---------+---------------+---------+-----------+----------+-------------------+ SFJ      Full                                                             +---------+---------------+---------+-----------+----------+-------------------+ FV Prox                                               patent by color and  Doppler             +---------+---------------+---------+-----------+----------+-------------------+ FV Mid                                                patent by color and                                                       Doppler             +---------+---------------+---------+-----------+----------+-------------------+ FV Distal                                             patent by color and                                                       Doppler             +---------+---------------+---------+-----------+----------+-------------------+ POP                                                   patent by color and                                                       Doppler             +---------+---------------+---------+-----------+----------+-------------------+ PTV      Full                                                             +---------+---------------+---------+-----------+----------+-------------------+ PERO     Full                                                              +---------+---------------+---------+-----------+----------+-------------------+     Summary: RIGHT: - No evidence of common femoral vein obstruction. pulsatile waveforms  LEFT: - There is no evidence of deep vein thrombosis in the lower extremity. However, portions of this examination were limited- see technologist comments above.  Pulsatile waveforms - Ultrasound characteristics of enlarged lymph nodes noted in the groin.  *See table(s) above for measurements and observations. Electronically signed by Penne Colorado MD on 07/25/2020 at 7:41:09 PM.    Final    CT Abdomen Pelvis W Contrast Result  Date: 07/25/2020 CLINICAL DATA:  Acute nonlocalized abdominal pain. Left leg swelling and pain. EXAM: CT ABDOMEN AND PELVIS WITH CONTRAST TECHNIQUE: Multidetector CT imaging of the abdomen and pelvis was performed using the standard protocol following bolus administration of intravenous contrast. CONTRAST:  80mL OMNIPAQUE  IOHEXOL  350 MG/ML SOLN COMPARISON:  No prior abdominal imaging. Concurrent chest CT reviewed. FINDINGS: Lower chest: Assessed on concurrent chest CT. Cardiomegaly. Coronary artery calcifications. Hepatobiliary: Post cholecystectomy. Biliary prominence is likely normal post cholecystectomy. Common bile duct measures 8 mm at the porta hepatis. There is a coarse calcification involving the anterior subcapsular liver, partially obscured by breathing motion artifact. No underlying lesion is seen on delayed phase. No suspicious focal hepatic lesion. Pancreas: Parenchymal atrophy. No ductal dilatation or inflammation. Spleen: Normal in size without focal abnormality. Adrenals/Urinary Tract: No adrenal nodule. No hydronephrosis or perinephric edema. Homogeneous renal enhancement with symmetric excretion on delayed phase imaging. Possible nonobstructing stone in the lower left kidney versus early excretion of IV contrast. Urinary bladder is distended without wall thickening. Stomach/Bowel:  Nondistended stomach. There is no small bowel obstruction or inflammatory change. High-riding cecum in the right upper quadrant. Normal appendix. Interposition of the colon anterior to the liver. Diffuse and multifocal colonic diverticulosis. Sigmoid colon is tortuous. No evidence of diverticulitis. Stool distends the rectum with possible rectocele. Vascular/Lymphatic: There prominent mildly enlarged lymph nodes in the left inguinal region measuring up to 14 mm. Prominent left external iliac nodes measure up to 9 mm. There is fat stranding adjacent to the external iliac and common femoral vessels. No discrete intraluminal filling defect in the venous structures. Arterial vascular calcifications without dissection, or acute arterial abnormality. Additional prominent left retroperitoneal nodes measure up to 8 mm the level of the iliac bifurcation. Aortic atherosclerosis and tortuosity. No aortic aneurysm. Patent portal vein. Reproductive: Status post hysterectomy. No adnexal masses. Left ovary tentatively visualized and quiescent. Right ovary not definitively seen. Other: No ascites or free fluid. No free air. No abdominopelvic abscess. There is fat stranding involving the left anterior aspect of the thigh which tracks along the iliac vessels into the retroperitoneum. No soft tissue air. Musculoskeletal: Mild to moderate compression fractures of L3 and L4 as well as mild L5 compression fracture likely chronic. Bones are diffusely under mineralized. Diffuse multilevel degenerative change in the spine. No evidence of left hip joint effusion or bony destruction. Fat stranding involving the subcutaneous tissues of the left inguinal region and anterior upper thigh without CT evidence of muscular involvement. IMPRESSION: 1. Fat stranding involving the left anterior aspect of the upper thigh which tracks along the iliac vessels into the retroperitoneum. No soft tissue air, focal fluid collection, or deep muscle involvement.  There are also prominent left inguinal and external iliac nodes. Suspect left lower extremity cellulitis with reactive adenopathy, however clinical correlation is needed. 2. No lower extremity DVT or acute vascular findings. 3. Colonic diverticulosis without diverticulitis. Stool distends the rectum with possible rectocele. 4. Compression fractures of L3, L4, and L5 are likely chronic. Aortic Atherosclerosis (ICD10-I70.0). Electronically Signed   By: Andrea Gasman M.D.   On: 07/25/2020 18:36   CT Angio Chest PE W/Cm &/Or Wo Cm Result Date: 07/25/2020 CLINICAL DATA:  PE suspected, high prob Left leg swelling and pain. EXAM: CT ANGIOGRAPHY CHEST WITH CONTRAST TECHNIQUE: Multidetector CT imaging of the chest was performed using the standard protocol during bolus administration of intravenous contrast. Multiplanar CT image reconstructions and MIPs were obtained to evaluate the vascular anatomy. Performed in conjunction  with CT of the abdomen/pelvis, reported separately. CONTRAST:  80mL OMNIPAQUE  IOHEXOL  350 MG/ML SOLN COMPARISON:  Radiograph earlier today. FINDINGS: Cardiovascular: There are no filling defects within the pulmonary arteries to suggest pulmonary embolus. Atherosclerosis of the thoracic aorta without aneurysm or dissection. Aorta is tortuous. Mild cardiomegaly, with particular left atrial enlargement. Minimal pericardial fluid without significant effusion. Coronary artery calcifications. Mediastinum/Nodes: Small right hilar nodes are subcentimeter and not enlarged by size criteria. There is no mediastinal or left hilar adenopathy. Patulous mid and upper esophagus. No esophageal wall thickening. No suspicious thyroid  nodule. Lungs/Pleura: Diffusely heterogeneous pulmonary parenchyma. Sub solid nodular opacity in the right upper lobe measures 12 mm, series 4, image 51. Irregular subpleural opacities involving the right greater than left lung apex are typical of pleuroparenchymal scarring, however  appears slightly nodular on the right. No septal thickening or findings of pulmonary edema. No pleural effusion. Scattered atelectasis in both dependent lower lobes. Excretory phase imaging limits bronchial assessment. Upper Abdomen: Assessed on concurrent abdominal CT, reported separately. Musculoskeletal: Exaggerated upper thoracic kyphosis. Multilevel degenerative change in the spine. There are no acute or suspicious osseous abnormalities. Surgical hardware in the left proximal humerus. Review of the MIP images confirms the above findings. IMPRESSION: 1. No pulmonary embolus. 2. Diffusely heterogeneous pulmonary parenchyma, can be seen with small vessel or small airways disease. 3. Sub solid ground-glass nodular opacity in the right upper lobe measures 12 mm. This is nonspecific, possibly infectious/inflammatory. Follow-up non-contrast CT recommended at 3-6 months to confirm persistence. If unchanged, and solid component remains <6 mm, annual CT is recommended until 5 years of stability has been established. If persistent these nodules should be considered highly suspicious if the solid component of the nodule is 6 mm or greater in size and enlarging. This recommendation follows the consensus statement: Guidelines for Management of Incidental Pulmonary Nodules Detected on CT Images: From the Fleischner Society 2017; Radiology 2017; 284:228-243. 4. Irregular subpleural opacities involving the right greater than left lung apex are typical of pleuroparenchymal scarring, however appears slightly nodular on the right. Recommend attention to this at follow-up. 5. Mild cardiomegaly with particular left atrial enlargement. Coronary artery calcifications. Aortic Atherosclerosis (ICD10-I70.0). Electronically Signed   By: Andrea Gasman M.D.   On: 07/25/2020 18:27   DG Chest Port 1 View Result Date: 07/25/2020 CLINICAL DATA:  Questionable sepsis - evaluate for abnormality Chest pain.  Leg edema. EXAM: PORTABLE CHEST 1  VIEW COMPARISON:  Chest radiograph 09/29/2017 FINDINGS: Patient is rotated. The chin obscures the left lung apex. Left costophrenic angle is not included in the field of view. Mild cardiomegaly. Aortic atherosclerosis. Questionable ill-defined left basilar opacity versus overlapping soft tissue structures. Mild vascular congestion no pneumothorax. Surgical hardware in the left proximal humerus. IMPRESSION: 1. Mild cardiomegaly and vascular congestion. 2. Questionable ill-defined left basilar opacity versus overlapping soft tissue structures. 3. Limited exam due to patient's chin obscuring the apices, rotation, and left costophrenic angle excluded from the field of view. PA and lateral views recommended when patient is able. Electronically Signed   By: Andrea Gasman M.D.   On: 07/25/2020 15:58    Assessment & Plan:   Problem List Items Addressed This Visit     Anemia   On oral iron Check CBC      Diarrhea   New episode Seems to be resolving (no loose stool x 2-3 d) Check CBC      Relevant Orders   CBC with Differential/Platelet (Completed)   Sedimentation rate (Completed)  Pre-diabetes - Primary   Checked  A1c in 03/2024 - nl      UTI (urinary tract infection)   2nd episode in 2 months UA and Cx Ceftin , Diflucan  Rx      Relevant Medications   cefUROXime  (CEFTIN ) 250 MG tablet   fluconazole  (DIFLUCAN ) 150 MG tablet   Other Relevant Orders   CULTURE, URINE COMPREHENSIVE (Completed)   Weight loss   New -unclear etiology Check labs, including TSH, etc Follow-up with Dr. Rollene      Relevant Orders   CBC with Differential/Platelet (Completed)   Comprehensive metabolic panel with GFR (Completed)   TSH (Completed)   CULTURE, URINE COMPREHENSIVE (Completed)   Sedimentation rate (Completed)   Other Visit Diagnoses       Vitamin D  deficiency       Relevant Orders   VITAMIN D  25 Hydroxy (Vit-D Deficiency, Fractures) (Completed)         Meds ordered this encounter   Medications   cefUROXime  (CEFTIN ) 250 MG tablet    Sig: Take 1 tablet (250 mg total) by mouth 2 (two) times daily with a meal.    Dispense:  10 tablet    Refill:  1   fluconazole  (DIFLUCAN ) 150 MG tablet    Sig: Take 1 tablet (150 mg total) by mouth daily.    Dispense:  7 tablet    Refill:  0      Follow-up: Return in about 6 weeks (around 07/23/2024) for f/u with PCP.  Marolyn Noel, MD

## 2024-06-11 NOTE — Assessment & Plan Note (Signed)
 On oral iron Check CBC

## 2024-06-11 NOTE — Assessment & Plan Note (Addendum)
 New -unclear etiology Check labs, including TSH, etc Follow-up with Dr. Rollene

## 2024-06-11 NOTE — Assessment & Plan Note (Signed)
 New episode Seems to be resolving (no loose stool x 2-3 d) Check CBC

## 2024-06-11 NOTE — Assessment & Plan Note (Addendum)
 Checked  A1c in 03/2024 - nl

## 2024-06-11 NOTE — Assessment & Plan Note (Addendum)
 2nd episode in 2 months UA and Cx Ceftin , Diflucan  Rx

## 2024-06-12 ENCOUNTER — Other Ambulatory Visit

## 2024-06-12 ENCOUNTER — Ambulatory Visit: Payer: Self-pay | Admitting: Internal Medicine

## 2024-06-12 DIAGNOSIS — R634 Abnormal weight loss: Secondary | ICD-10-CM

## 2024-06-12 DIAGNOSIS — N390 Urinary tract infection, site not specified: Secondary | ICD-10-CM

## 2024-06-12 LAB — COMPREHENSIVE METABOLIC PANEL WITH GFR
ALT: 22 U/L (ref 0–35)
AST: 26 U/L (ref 0–37)
Albumin: 4.1 g/dL (ref 3.5–5.2)
Alkaline Phosphatase: 96 U/L (ref 39–117)
BUN: 13 mg/dL (ref 6–23)
CO2: 26 meq/L (ref 19–32)
Calcium: 9.6 mg/dL (ref 8.4–10.5)
Chloride: 106 meq/L (ref 96–112)
Creatinine, Ser: 0.58 mg/dL (ref 0.40–1.20)
GFR: 79.52 mL/min
Glucose, Bld: 119 mg/dL — ABNORMAL HIGH (ref 70–99)
Potassium: 3.7 meq/L (ref 3.5–5.1)
Sodium: 142 meq/L (ref 135–145)
Total Bilirubin: 0.7 mg/dL (ref 0.2–1.2)
Total Protein: 7 g/dL (ref 6.0–8.3)

## 2024-06-12 LAB — CBC WITH DIFFERENTIAL/PLATELET
Basophils Absolute: 0 K/uL (ref 0.0–0.1)
Basophils Relative: 0.8 % (ref 0.0–3.0)
Eosinophils Absolute: 0.1 K/uL (ref 0.0–0.7)
Eosinophils Relative: 1.3 % (ref 0.0–5.0)
HCT: 32.9 % — ABNORMAL LOW (ref 36.0–46.0)
Hemoglobin: 10.8 g/dL — ABNORMAL LOW (ref 12.0–15.0)
Lymphocytes Relative: 31.2 % (ref 12.0–46.0)
Lymphs Abs: 1.9 K/uL (ref 0.7–4.0)
MCHC: 32.8 g/dL (ref 30.0–36.0)
MCV: 87.1 fl (ref 78.0–100.0)
Monocytes Absolute: 0.5 K/uL (ref 0.1–1.0)
Monocytes Relative: 8.2 % (ref 3.0–12.0)
Neutro Abs: 3.5 K/uL (ref 1.4–7.7)
Neutrophils Relative %: 58.5 % (ref 43.0–77.0)
Platelets: 330 K/uL (ref 150.0–400.0)
RBC: 3.78 Mil/uL — ABNORMAL LOW (ref 3.87–5.11)
RDW: 18.4 % — ABNORMAL HIGH (ref 11.5–15.5)
WBC: 6 K/uL (ref 4.0–10.5)

## 2024-06-12 LAB — SEDIMENTATION RATE: Sed Rate: 22 mm/h (ref 0–30)

## 2024-06-12 LAB — VITAMIN D 25 HYDROXY (VIT D DEFICIENCY, FRACTURES): VITD: 42.47 ng/mL (ref 30.00–100.00)

## 2024-06-12 LAB — TSH: TSH: 2.96 u[IU]/mL (ref 0.35–5.50)

## 2024-06-15 LAB — CULTURE, URINE COMPREHENSIVE
MICRO NUMBER:: 16711982
SPECIMEN QUALITY:: ADEQUATE

## 2024-07-09 ENCOUNTER — Encounter: Payer: Self-pay | Admitting: Podiatry

## 2024-07-09 ENCOUNTER — Ambulatory Visit: Admitting: Podiatry

## 2024-07-09 DIAGNOSIS — M79675 Pain in left toe(s): Secondary | ICD-10-CM

## 2024-07-09 DIAGNOSIS — M79674 Pain in right toe(s): Secondary | ICD-10-CM

## 2024-07-09 DIAGNOSIS — B351 Tinea unguium: Secondary | ICD-10-CM

## 2024-07-09 NOTE — Progress Notes (Signed)
 This patient returns to my office for at risk foot care.  This patient requires this care by a professional since this patient will be at risk due to having coagulation defect due to eliquis.  This patient is unable to cut nails herself since the patient cannot reach her nails.These nails are painful walking and wearing shoes.  This patient presents for at risk foot care today.  General Appearance  Alert, conversant and in no acute stress.  Vascular  Dorsalis pedis and posterior tibial  pulses are absent   bilaterally.  Capillary return is within normal limits  bilaterally. Temperature is within normal limits  bilaterally.  Neurologic  Senn-Weinstein monofilament wire test within normal limits  bilaterally. Muscle power within normal limits bilaterally.  Nails Thick disfigured discolored nails with subungual debris  from hallux to fifth toes bilaterally. No evidence of bacterial infection or drainage bilaterally.  Orthopedic  No limitations of motion  feet .  No crepitus or effusions noted.  No bony pathology or digital deformities noted.  Skin  normotropic skin with no porokeratosis noted bilaterally.  No signs of infections or ulcers noted.     Onychomycosis  Pain in right toes  Pain in left toes  Consent was obtained for treatment procedures.   Mechanical debridement of nails 1-5  bilaterally performed with a nail nipper.  Filed with dremel without incident.    Return office visit   3 months                   Told patient to return for periodic foot care and evaluation due to potential at risk complications.   Helane Gunther DPM

## 2024-07-23 ENCOUNTER — Other Ambulatory Visit: Payer: Self-pay | Admitting: Infectious Disease

## 2024-07-23 DIAGNOSIS — L03119 Cellulitis of unspecified part of limb: Secondary | ICD-10-CM

## 2024-09-09 NOTE — Progress Notes (Unsigned)
 Subjective:  Chief Complaint: follow-up for recurrent cellulitis   Patient ID: [name], female    DOB: 1931/04/14, 89 y.o.   MRN: [id number]  HPI  Past Medical History:  Diagnosis Date   A-fib [facility])    paroxysmal   Aftercare for healing traumatic fracture of arm, unspecified    H/O: hemorrhoidectomy    H/O: hysterectomy    Hyperlipidemia    Hypertension    Malignant melanoma of skin of trunk, except scrotum (HCC)    Other postprocedural status(V45.89)    Recurrent cellulitis 09/24/2023   Rosacea    S/P cholecystectomy    Sciatica    right   Sprain of lumbar region     Past Surgical History:  Procedure Laterality Date   ABDOMINAL HYSTERECTOMY     BLADDER SUSPENSION     never go surgery because it got canceled due to heart rate    cataract Bilateral 2019   catheter ablation of atrail flutter     CHOLECYSTECTOMY     HERNIA REPAIR      Family History  Problem Relation Age of Onset   Heart failure Mother    Heart failure Father    Liver cancer Daughter       Social History   Socioeconomic History   Marital status: Widowed    Spouse name: Not on file   Number of children: 6   Years of education: 10   Highest education level: Not on file  Occupational History   Occupation: Public librarian    Employer: RETIRED    Comment: retired  Tobacco Use   Smoking status: Never   Smokeless tobacco: Never  Vaping Use   Vaping status: Never Used  Substance and Sexual Activity   Alcohol  use: No   Drug use: No   Sexual activity: Never  Other Topics Concern   Not on file  Social History Narrative   10th grade. Married '51.4 sons- '52, '54, '58, '70; 2 daughters- '56, '64 older girl died of ovarian cancer.11 grandchildren, 2 great grands. 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       Lives alone./2025   Social Drivers of Health   Financial Resource Strain: Low Risk  (03/13/2024)    Overall Financial Resource Strain (CARDIA)    Difficulty of Paying Living Expenses: Not hard at all  Food Insecurity: No Food Insecurity (03/13/2024)   Hunger Vital Sign    Worried About Running Out of Food in the Last Year: Never true    Ran Out of Food in the Last Year: Never true  Transportation Needs: No Transportation Needs (03/13/2024)   PRAPARE - Administrator, Civil Service (Medical): No    Lack of Transportation (Non-Medical): No  Physical Activity: Inactive (03/13/2024)   Exercise Vital Sign    Days of Exercise per Week: 0 days    Minutes of Exercise per Session: 0 min  Stress: No Stress Concern Present (03/13/2024)   [employer] of Occupational Health - Occupational Stress Questionnaire    Feeling of Stress : Not at all  Social Connections: Moderately Isolated (03/13/2024)   Social Connection and Isolation Panel    Frequency of Communication with Friends and Family: More than three times a week    Frequency of Social Gatherings with Friends and Family: Twice a week    Attends Religious Services: More than 4 times per year    Active Member of Clubs or  Organizations: No    Attends Banker Meetings: Never    Marital Status: Widowed    Allergies  Allergen Reactions   Antihistamines, Diphenhydramine-Type Other (See Comments)    ALL ANTIHISTAMINES causes her heart to race   Oxycodone-Acetaminophen  Nausea Only     Current Outpatient Medications:    acetaminophen  (TYLENOL ) 500 MG tablet, Take 1,000 mg by mouth every 6 (six) hours as needed for headache (pain). For pain, Disp: , Rfl:    ALPRAZolam  (XANAX ) 0.25 MG tablet, Take 1 tablet (0.25 mg total) by mouth daily as needed. for anxiety, Disp: 30 tablet, Rfl: 1   amoxicillin  (AMOXIL ) 500 MG capsule, TAKE 1 CAPSULE BY MOUTH EVERY DAY, Disp: 90 capsule, Rfl: 0   apixaban  (ELIQUIS ) 5 MG TABS tablet, Take 1 tablet (5 mg total) by mouth 2 (two) times daily., Disp: 180 tablet, Rfl: 3   atorvastatin   (LIPITOR) 20 MG tablet, Take 1 tablet (20 mg total) by mouth daily., Disp: 90 tablet, Rfl: 3   Calcium  Carbonate-Vitamin D  (CALCIUM -VITAMIN D ) 500-200 MG-UNIT per tablet, Take 1 tablet by mouth every other day., Disp: , Rfl:    Carboxymethylcellulose Sodium (THERATEARS OP), Place 1 drop into both eyes 2 (two) times daily as needed (dry eyes)., Disp: , Rfl:    cefUROXime  (CEFTIN ) 250 MG tablet, Take 1 tablet (250 mg total) by mouth 2 (two) times daily with a meal., Disp: 10 tablet, Rfl: 1   diltiazem  (CARDIZEM  CD) 120 MG 24 hr capsule, Take 2 capsules (240 mg total) by mouth daily., Disp: 180 capsule, Rfl: 3   docusate sodium  (COLACE) 100 MG capsule, Take 1 capsule (100 mg total) by mouth 2 (two) times daily. (Patient taking differently: Take 100 mg by mouth as needed.), Disp: 10 capsule, Rfl: 0   feeding supplement, GLUCERNA SHAKE, (GLUCERNA SHAKE) LIQD, Take 237 mLs by mouth 2 (two) times daily between meals., Disp: , Rfl: 0   ferrous sulfate 325 (65 FE) MG EC tablet, Take 325 mg by mouth daily., Disp: , Rfl:    fluconazole  (DIFLUCAN ) 150 MG tablet, Take 1 tablet (150 mg total) by mouth daily., Disp: 7 tablet, Rfl: 0   furosemide  (LASIX ) 40 MG tablet, Take 1 tablet (40 mg total) by mouth at bedtime. 9pm, Disp: 90 tablet, Rfl: 3   Hydrocortisone (GERHARDT'S BUTT CREAM) CREA, Apply 1 application topically daily as needed for irritation. , Disp: , Rfl:    latanoprost (XALATAN) 0.005 % ophthalmic solution, SMARTSIG:1 Drop(s) In Eye(s) Every Evening, Disp: , Rfl:    metoprolol  tartrate (LOPRESSOR ) 50 MG tablet, Take 1 tablet (50 mg total) by mouth 2 (two) times daily., Disp: 180 tablet, Rfl: 3   Multiple Vitamins-Minerals (MULTIVITAMIN WITH MINERALS) tablet, Take 1 tablet by mouth daily at 6 PM., Disp: , Rfl:    Multiple Vitamins-Minerals (PRESERVISION AREDS 2) CAPS, Take 1 capsule by mouth in the morning and at bedtime., Disp: , Rfl:    nystatin -triamcinolone  ointment (MYCOLOG), Apply 1 Application  topically 2 (two) times daily., Disp: 100 g, Rfl: 3   polyethylene glycol (MIRALAX  / GLYCOLAX ) 17 g packet, Take 17 g by mouth daily., Disp: 3350 each, Rfl: 11   Probiotic Product (DAILY PROBIOTIC) CAPS, Take 1 capsule by mouth daily. 30 billion live culture with 12  probiotic strains, Disp: , Rfl:    Triamcinolone  Acetonide (TRIAMCINOLONE  0.1 % CREAM : EUCERIN) CREA, Apply 1 application topically 2 (two) times daily as needed for rash, itching or irritation., Disp: , Rfl:    Zinc   Oxide (TRIPLE PASTE) 12.8 % ointment, Apply 1 application topically as needed for irritation., Disp: , Rfl:    Past Medical History:  Diagnosis Date   A-fib (HCC)    paroxysmal   Aftercare for healing traumatic fracture of arm, unspecified    H/O: hemorrhoidectomy    H/O: hysterectomy    Hyperlipidemia    Hypertension    Malignant melanoma of skin of trunk, except scrotum (HCC)    Other postprocedural status(V45.89)    Recurrent cellulitis 09/24/2023   Rosacea    S/P cholecystectomy    Sciatica    right   Sprain of lumbar region     Past Surgical History:  Procedure Laterality Date   ABDOMINAL HYSTERECTOMY     BLADDER SUSPENSION     never go surgery because it got canceled due to heart rate    cataract Bilateral 2019   catheter ablation of atrail flutter     CHOLECYSTECTOMY     HERNIA REPAIR      Family History  Problem Relation Age of Onset   Heart failure Mother    Heart failure Father    Liver cancer Daughter       Social History   Socioeconomic History   Marital status: Widowed    Spouse name: Not on file   Number of children: 6   Years of education: 10   Highest education level: Not on file  Occupational History   Occupation: Public librarian    Employer: RETIRED    Comment: retired  Tobacco Use   Smoking status: Never   Smokeless tobacco: Never  Vaping Use   Vaping status: Never Used  Substance and Sexual Activity   Alcohol  use: No   Drug use: No   Sexual activity:  Never  Other Topics Concern   Not on file  Social History Narrative   10th grade. Married '51.4 sons- '52, '54, '58, '70; 2 daughters- '56, '64 older girl died of ovarian cancer.11 grandchildren, 2 great grands. 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       Lives alone./2025   Social Drivers of Health   Financial Resource Strain: Low Risk  (03/13/2024)   Overall Financial Resource Strain (CARDIA)    Difficulty of Paying Living Expenses: Not hard at all  Food Insecurity: No Food Insecurity (03/13/2024)   Hunger Vital Sign    Worried About Running Out of Food in the Last Year: Never true    Ran Out of Food in the Last Year: Never true  Transportation Needs: No Transportation Needs (03/13/2024)   PRAPARE - Administrator, Civil Service (Medical): No    Lack of Transportation (Non-Medical): No  Physical Activity: Inactive (03/13/2024)   Exercise Vital Sign    Days of Exercise per Week: 0 days    Minutes of Exercise per Session: 0 min  Stress: No Stress Concern Present (03/13/2024)   [employer] of Occupational Health - Occupational Stress Questionnaire    Feeling of Stress : Not at all  Social Connections: Moderately Isolated (03/13/2024)   Social Connection and Isolation Panel    Frequency of Communication with Friends and Family: More than three times a week    Frequency of Social Gatherings with Friends and Family: Twice a week    Attends Religious Services: More than 4 times per year    Active Member of [employer] or Organizations: No    Attends Banker Meetings: Never  Marital Status: Widowed    Allergies  Allergen Reactions   Antihistamines, Diphenhydramine-Type Other (See Comments)    ALL ANTIHISTAMINES causes her heart to race   Oxycodone-Acetaminophen  Nausea Only     Current Outpatient Medications:    acetaminophen  (TYLENOL ) 500 MG tablet, Take 1,000 mg by mouth every 6  (six) hours as needed for headache (pain). For pain, Disp: , Rfl:    ALPRAZolam  (XANAX ) 0.25 MG tablet, Take 1 tablet (0.25 mg total) by mouth daily as needed. for anxiety, Disp: 30 tablet, Rfl: 1   amoxicillin  (AMOXIL ) 500 MG capsule, TAKE 1 CAPSULE BY MOUTH EVERY DAY, Disp: 90 capsule, Rfl: 0   apixaban  (ELIQUIS ) 5 MG TABS tablet, Take 1 tablet (5 mg total) by mouth 2 (two) times daily., Disp: 180 tablet, Rfl: 3   atorvastatin  (LIPITOR) 20 MG tablet, Take 1 tablet (20 mg total) by mouth daily., Disp: 90 tablet, Rfl: 3   Calcium  Carbonate-Vitamin D  (CALCIUM -VITAMIN D ) 500-200 MG-UNIT per tablet, Take 1 tablet by mouth every other day., Disp: , Rfl:    Carboxymethylcellulose Sodium (THERATEARS OP), Place 1 drop into both eyes 2 (two) times daily as needed (dry eyes)., Disp: , Rfl:    cefUROXime  (CEFTIN ) 250 MG tablet, Take 1 tablet (250 mg total) by mouth 2 (two) times daily with a meal., Disp: 10 tablet, Rfl: 1   diltiazem  (CARDIZEM  CD) 120 MG 24 hr capsule, Take 2 capsules (240 mg total) by mouth daily., Disp: 180 capsule, Rfl: 3   docusate sodium  (COLACE) 100 MG capsule, Take 1 capsule (100 mg total) by mouth 2 (two) times daily. (Patient taking differently: Take 100 mg by mouth as needed.), Disp: 10 capsule, Rfl: 0   feeding supplement, GLUCERNA SHAKE, (GLUCERNA SHAKE) LIQD, Take 237 mLs by mouth 2 (two) times daily between meals., Disp: , Rfl: 0   ferrous sulfate 325 (65 FE) MG EC tablet, Take 325 mg by mouth daily., Disp: , Rfl:    fluconazole  (DIFLUCAN ) 150 MG tablet, Take 1 tablet (150 mg total) by mouth daily., Disp: 7 tablet, Rfl: 0   furosemide  (LASIX ) 40 MG tablet, Take 1 tablet (40 mg total) by mouth at bedtime. 9pm, Disp: 90 tablet, Rfl: 3   Hydrocortisone (GERHARDT'S BUTT CREAM) CREA, Apply 1 application topically daily as needed for irritation. , Disp: , Rfl:    latanoprost (XALATAN) 0.005 % ophthalmic solution, SMARTSIG:1 Drop(s) In Eye(s) Every Evening, Disp: , Rfl:    metoprolol   tartrate (LOPRESSOR ) 50 MG tablet, Take 1 tablet (50 mg total) by mouth 2 (two) times daily., Disp: 180 tablet, Rfl: 3   Multiple Vitamins-Minerals (MULTIVITAMIN WITH MINERALS) tablet, Take 1 tablet by mouth daily at 6 PM., Disp: , Rfl:    Multiple Vitamins-Minerals (PRESERVISION AREDS 2) CAPS, Take 1 capsule by mouth in the morning and at bedtime., Disp: , Rfl:    nystatin -triamcinolone  ointment (MYCOLOG), Apply 1 Application topically 2 (two) times daily., Disp: 100 g, Rfl: 3   polyethylene glycol (MIRALAX  / GLYCOLAX ) 17 g packet, Take 17 g by mouth daily., Disp: 3350 each, Rfl: 11   Probiotic Product (DAILY PROBIOTIC) CAPS, Take 1 capsule by mouth daily. 30 billion live culture with 12  probiotic strains, Disp: , Rfl:    Triamcinolone  Acetonide (TRIAMCINOLONE  0.1 % CREAM : EUCERIN) CREA, Apply 1 application topically 2 (two) times daily as needed for rash, itching or irritation., Disp: , Rfl:    Zinc  Oxide (TRIPLE PASTE) 12.8 % ointment, Apply 1 application topically as needed for irritation.,  Disp: , Rfl:    Review of Systems     Objective:   Physical Exam        Assessment & Plan:

## 2024-09-10 ENCOUNTER — Ambulatory Visit (INDEPENDENT_AMBULATORY_CARE_PROVIDER_SITE_OTHER): Payer: Medicare Other | Admitting: Infectious Disease

## 2024-09-10 ENCOUNTER — Other Ambulatory Visit: Payer: Self-pay

## 2024-09-10 VITALS — Wt 127.0 lb

## 2024-09-10 DIAGNOSIS — L03119 Cellulitis of unspecified part of limb: Secondary | ICD-10-CM | POA: Diagnosis not present

## 2024-09-10 DIAGNOSIS — L0390 Cellulitis, unspecified: Secondary | ICD-10-CM | POA: Diagnosis not present

## 2024-09-10 DIAGNOSIS — I872 Venous insufficiency (chronic) (peripheral): Secondary | ICD-10-CM | POA: Diagnosis not present

## 2024-09-10 DIAGNOSIS — Z7185 Encounter for immunization safety counseling: Secondary | ICD-10-CM

## 2024-09-10 DIAGNOSIS — J3489 Other specified disorders of nose and nasal sinuses: Secondary | ICD-10-CM

## 2024-09-10 DIAGNOSIS — L989 Disorder of the skin and subcutaneous tissue, unspecified: Secondary | ICD-10-CM

## 2024-09-10 DIAGNOSIS — R600 Localized edema: Secondary | ICD-10-CM

## 2024-09-10 MED ORDER — AMOXICILLIN 500 MG PO CAPS: 500.0000 mg | ORAL_CAPSULE | Freq: Every day | ORAL | 3 refills | Status: AC

## 2024-12-11 ENCOUNTER — Telehealth: Payer: Self-pay | Admitting: Internal Medicine

## 2024-12-11 NOTE — Telephone Encounter (Signed)
 Patient left handicap placard form - please mail when it is completed - placed in Dr. Rogena box up front -

## 2024-12-11 NOTE — Telephone Encounter (Signed)
 Request for Handicap Placard has been received and placed in Dr. Marcel box (in office) for signature.

## 2024-12-16 NOTE — Telephone Encounter (Signed)
 Handicap Placard has been signed and mailed to pt at her request.

## 2025-03-18 ENCOUNTER — Ambulatory Visit

## 2025-09-09 ENCOUNTER — Ambulatory Visit: Admitting: Infectious Disease
